# Patient Record
Sex: Female | Born: 1965
Health system: Southern US, Community
[De-identification: ages and names within clinical notes are randomized; demographics above are authoritative.]

## PROBLEM LIST (undated history)

## (undated) DIAGNOSIS — K909 Intestinal malabsorption, unspecified: Secondary | ICD-10-CM

## (undated) DIAGNOSIS — R569 Unspecified convulsions: Secondary | ICD-10-CM

## (undated) DIAGNOSIS — R112 Nausea with vomiting, unspecified: Secondary | ICD-10-CM

## (undated) DIAGNOSIS — L570 Actinic keratosis: Secondary | ICD-10-CM

## (undated) DIAGNOSIS — E8940 Asymptomatic postprocedural ovarian failure: Secondary | ICD-10-CM

## (undated) DIAGNOSIS — T7840XA Allergy, unspecified, initial encounter: Secondary | ICD-10-CM

## (undated) DIAGNOSIS — D509 Iron deficiency anemia, unspecified: Secondary | ICD-10-CM

## (undated) DIAGNOSIS — Z9889 Other specified postprocedural states: Secondary | ICD-10-CM

## (undated) DIAGNOSIS — Z9884 Bariatric surgery status: Secondary | ICD-10-CM

## (undated) DIAGNOSIS — M797 Fibromyalgia: Secondary | ICD-10-CM

## (undated) DIAGNOSIS — I10 Essential (primary) hypertension: Secondary | ICD-10-CM

## (undated) DIAGNOSIS — G4700 Insomnia, unspecified: Secondary | ICD-10-CM

## (undated) DIAGNOSIS — M1990 Unspecified osteoarthritis, unspecified site: Secondary | ICD-10-CM

## (undated) DIAGNOSIS — D649 Anemia, unspecified: Secondary | ICD-10-CM

## (undated) HISTORY — DX: Bariatric surgery status: Z98.84

## (undated) HISTORY — DX: Actinic keratosis: L57.0

## (undated) HISTORY — DX: Allergy, unspecified, initial encounter: T78.40XA

## (undated) HISTORY — DX: Iron deficiency anemia, unspecified: D50.9

## (undated) HISTORY — PX: CHOLECYSTECTOMY: SHX55

## (undated) HISTORY — PX: APPENDECTOMY: SHX54

## (undated) HISTORY — DX: Asymptomatic postprocedural ovarian failure: E89.40

## (undated) HISTORY — DX: Unspecified osteoarthritis, unspecified site: M19.90

## (undated) HISTORY — DX: Intestinal malabsorption, unspecified: K90.9

## (undated) HISTORY — DX: Insomnia, unspecified: G47.00

---
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
## 1995-06-10 HISTORY — PX: ABDOMINAL HYSTERECTOMY: SHX81

## 2000-06-09 HISTORY — PX: KNEE ARTHROSCOPY: SUR90

## 2009-06-09 HISTORY — PX: GASTRIC BYPASS: SHX52

## 2011-08-16 DIAGNOSIS — E782 Mixed hyperlipidemia: Secondary | ICD-10-CM | POA: Insufficient documentation

## 2011-08-16 DIAGNOSIS — IMO0001 Reserved for inherently not codable concepts without codable children: Secondary | ICD-10-CM | POA: Insufficient documentation

## 2011-08-16 DIAGNOSIS — G4700 Insomnia, unspecified: Secondary | ICD-10-CM | POA: Insufficient documentation

## 2011-08-16 DIAGNOSIS — Z9079 Acquired absence of other genital organ(s): Secondary | ICD-10-CM | POA: Insufficient documentation

## 2011-08-16 DIAGNOSIS — L219 Seborrheic dermatitis, unspecified: Secondary | ICD-10-CM | POA: Insufficient documentation

## 2011-08-16 DIAGNOSIS — IMO0002 Reserved for concepts with insufficient information to code with codable children: Secondary | ICD-10-CM | POA: Insufficient documentation

## 2011-08-16 DIAGNOSIS — K589 Irritable bowel syndrome without diarrhea: Secondary | ICD-10-CM | POA: Insufficient documentation

## 2011-09-03 DIAGNOSIS — Z9884 Bariatric surgery status: Secondary | ICD-10-CM | POA: Insufficient documentation

## 2011-09-04 DIAGNOSIS — D509 Iron deficiency anemia, unspecified: Secondary | ICD-10-CM | POA: Insufficient documentation

## 2011-09-04 DIAGNOSIS — R002 Palpitations: Secondary | ICD-10-CM | POA: Insufficient documentation

## 2011-09-04 DIAGNOSIS — R5383 Other fatigue: Secondary | ICD-10-CM | POA: Insufficient documentation

## 2012-08-15 DIAGNOSIS — L0390 Cellulitis, unspecified: Secondary | ICD-10-CM | POA: Insufficient documentation

## 2012-11-09 DIAGNOSIS — Z0000 Encounter for general adult medical examination without abnormal findings: Secondary | ICD-10-CM | POA: Insufficient documentation

## 2013-03-07 ENCOUNTER — Emergency Department
Admission: EM | Admit: 2013-03-07 | Discharge: 2013-03-07 | Disposition: A | Discharge status: Home / Self Care | Payer: 59 | Source: Home / Self Care | Attending: Emergency Medicine | Admitting: Emergency Medicine

## 2013-03-07 ENCOUNTER — Encounter: Payer: Self-pay | Admitting: *Deleted

## 2013-03-07 DIAGNOSIS — M545 Low back pain, unspecified: Secondary | ICD-10-CM

## 2013-03-07 DIAGNOSIS — R509 Fever, unspecified: Secondary | ICD-10-CM

## 2013-03-07 DIAGNOSIS — R52 Pain, unspecified: Secondary | ICD-10-CM

## 2013-03-07 HISTORY — DX: Essential (primary) hypertension: I10

## 2013-03-07 HISTORY — DX: Fibromyalgia: M79.7

## 2013-03-07 LAB — POCT CBC W AUTO DIFF (K'VILLE URGENT CARE)

## 2013-03-07 LAB — POCT INFLUENZA A/B
Influenza A, POC: NEGATIVE
Influenza B, POC: NEGATIVE

## 2013-03-07 LAB — POCT URINALYSIS DIP (MANUAL ENTRY)
Glucose, UA: NEGATIVE
Ketones, POC UA: NEGATIVE
Nitrite, UA: NEGATIVE
Spec Grav, UA: 1.015 (ref 1.005–1.03)
Urobilinogen, UA: 0.2 (ref 0–1)
pH, UA: 6 (ref 5–8)

## 2013-03-07 MED ORDER — LEVOFLOXACIN 500 MG PO TABS: 500.0000 mg | ORAL_TABLET | Freq: Every day | ORAL | Status: DC

## 2013-03-07 NOTE — ED Notes (Signed)
Pt c/o fever, HA, chills, body aches x 4 days. She reports having lower abd pain and back pain last wk, she went to an urgent care and was told UA was negative, and xray's showed constipation.

## 2013-03-07 NOTE — ED Provider Notes (Signed)
CSN: [id number]     Arrival date & time 03/07/13  1222 History   None    Chief Complaint  Patient presents with  . Fever  . Chills  . Generalized Body Aches  . Headache   (Consider location/radiation/quality/duration/timing/severity/associated sxs/prior Treatment) HPI This is a previously healthy patient who comes in today complaining of about 4 days of fever, headache, chills, body aches.  She also had some lower abdominal pain and lower back pain for the last week and went to an urgent care in [facility] and was told that her urinalysis was negative and an x-ray showed some constipation so they gave her some medicine for that.  Her abdominal pain feels a little better.  She does have a history of kidney stones but reports no dysuria, hematuria, frequency.  Has had some minimal upper respiratory symptoms especially sinus pressure but nothing significant.  No shortness of breath or chest pain.  A temperature last night was 103.  She was recently on a honeymoon including riding motorcycle around [place] and [place].  Past Medical History  Diagnosis Date  . Hypertension   . Fibromyalgia    Past Surgical History  Procedure Laterality Date  . Tonsillectomy    . Appendectomy    . Cholecystectomy    . Abdominal hysterectomy    . Cesarean section    . Knee arthroscopy    . Gastric bypass     Family History  Problem Relation Age of Onset  . Diabetes Father   . Hypertension Father   . Stroke Father   . Heart failure Father    History  Substance Use Topics  . Smoking status: Never Smoker   . Smokeless tobacco: Not on file  . Alcohol Use: No   OB History   Grav Para Term Preterm Abortions TAB SAB Ect Mult Living                 Review of Systems All other systems are reviewed and are negative  Allergies  Mobic and Shellfish allergy  Home Medications  No current outpatient prescriptions on file. BP 122/84  Pulse 105  Temp(Src) 102.3 F (39.1 C) (Oral)   Resp 18  Wt 174 lb (78.926 kg)  SpO2 100% Physical Exam In general the no acute distress and alert and oriented x3. HEENT exam shows no erythema of the throat but she does have some mild anterior cervical lymphadenopathy.  Ear exam is normal. Lungs are clear bilaterally Heart regular rate and rhythm Back exam shows lower right paralumbar tenderness but no actual CVA tenderness full range of motion  neuro distal neurovascular status intact Abdominal exam is benign nontender nondistended no rebound McBurney's point and Murphy sign negative   ED Course  Procedures (including critical care time) Labs Review Labs Reviewed - No data to display Imaging Review No results found. Results for orders placed during the hospital encounter of 03/07/13  POCT URINALYSIS DIP (MANUAL ENTRY)      Result Value Range   Color, UA yellow     Clarity, UA cloudy     Glucose, UA neg     Bilirubin, UA small     Bilirubin, UA negative     Spec Grav, UA 1.015  1.005 - 1.03   Blood, UA trace-lysed     pH, UA 6.0  5 - 8   Protein Ur, POC =30     Urobilinogen, UA 0.2  0 - 1   Nitrite, UA Negative  Leukocytes, UA small (1+)    POCT INFLUENZA A/B      Result Value Range   Influenza A, POC Negative     Influenza B, POC Negative    POCT CBC W AUTO DIFF ([facility])      Result Value Range   WBC    4.5 - 10.5 K/uL   Lymphocytes relative %    15 - 45 %   Monocytes relative %    2 - 10 %   Neutrophils relative % (GR)    44 - 76 %   Lymphocytes absolute    0.1 - 1.8 K/uL   Monocyes absolute    0.1 - 1 K/uL   Neutrophils absolute (GR#)    1.7 - 7.8 K/uL   RBC    3.8 - 5.1 MIL/uL   Hemoglobin    11.8 - 15.5 g/dL   Hematocrit    16.1 - 46 %   MCV    78 - 100 fL   MCH    26 - 32 pg   MCHC    32 - 36.5 g/dL   RDW    09.6 - 14 %   Platelet count    140 - 400 K/uL   MPV    7.8 - 11 fL     MDM  No diagnosis found.  Due to the vague symptoms, yet obvious fever and illness, we're going to do  some testing in-house including CBC w/ diff, UA & culture, and flu test.   CBC is essentially normal with no left shift. Urinalysis shows some trace blood and mild leukocytes Flu test is negative I suspect that her symptoms as well as lab results, she may have the beginning of an urinary tract infection versus possible sinusitis or upper respiratory viral syndrome.  However likely is a kidney stone causing her symptoms including back pain and fever.   I gave a prescription for Levaquin to cover any kind of bacterial cause and educated her on if her symptoms continue or get worse, she will either need to see a urologist more have a CT abdomen pelvis.  I would also like her to use a strainer to see if she can catch any sediment in her urine.  Marlaine Hind, MD 03/07/13 [phone]

## 2013-03-08 LAB — URINE CULTURE
Colony Count: NO GROWTH
Organism ID, Bacteria: NO GROWTH

## 2013-03-09 ENCOUNTER — Telehealth: Payer: Self-pay | Admitting: *Deleted

## 2013-03-14 ENCOUNTER — Encounter: Payer: Self-pay | Admitting: Family Medicine

## 2013-03-14 DIAGNOSIS — N12 Tubulo-interstitial nephritis, not specified as acute or chronic: Secondary | ICD-10-CM | POA: Insufficient documentation

## 2013-04-11 ENCOUNTER — Encounter: Payer: Self-pay | Admitting: Family Medicine

## 2013-04-11 ENCOUNTER — Ambulatory Visit (INDEPENDENT_AMBULATORY_CARE_PROVIDER_SITE_OTHER): Payer: 59 | Admitting: Family Medicine

## 2013-04-11 VITALS — BP 128/87 | HR 71 | Ht 64.0 in | Wt 175.0 lb

## 2013-04-11 DIAGNOSIS — F411 Generalized anxiety disorder: Secondary | ICD-10-CM | POA: Insufficient documentation

## 2013-04-11 DIAGNOSIS — M797 Fibromyalgia: Secondary | ICD-10-CM | POA: Insufficient documentation

## 2013-04-11 DIAGNOSIS — I10 Essential (primary) hypertension: Secondary | ICD-10-CM | POA: Insufficient documentation

## 2013-04-11 DIAGNOSIS — L723 Sebaceous cyst: Secondary | ICD-10-CM

## 2013-04-11 DIAGNOSIS — IMO0001 Reserved for inherently not codable concepts without codable children: Secondary | ICD-10-CM

## 2013-04-11 DIAGNOSIS — Z131 Encounter for screening for diabetes mellitus: Secondary | ICD-10-CM

## 2013-04-11 DIAGNOSIS — Z13220 Encounter for screening for lipoid disorders: Secondary | ICD-10-CM

## 2013-04-11 MED ORDER — CLONAZEPAM 0.5 MG PO TABS: 0.5000 mg | ORAL_TABLET | Freq: Every evening | ORAL | Status: DC | PRN

## 2013-04-11 NOTE — Progress Notes (Signed)
CC: Stacy Woods is a 47 y.o. female is here for Establish Care   Subjective: HPI:  Pleasant 47 year old here to establish care  Patient reports decades of fibromyalgia which has been worsening over the past 2 months. Symptoms are described as a burning and tingling "ripping" sensation that involves the trapezius muscles, quadriceps, shins is worse first thing in the morning it is worse to touch doesn't particularly helps it other than waiting for it to subside by the end of the day. Character of symptoms have not changed since diagnosis years ago. 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 It is been interfering with her sleep on a nightly basis trouble getting asleep and staying asleep. She said the sleeping issue in the past and responded well to nighttime Xanax. Denies a history of alcohol abuse, recreational drug use recently or remotely. She has used lyrica, Cymbalta, another medication she can't remember that have been ineffective for fibromyalgia  Reports a history of essential hypertension: She has been on lisinopril for over a year but she did take a one-month holiday the summer and realized that she still has uncontrolled hypertension outside blood pressures have been in the normotensive state since restarting lisinopril  Patient presents of lumps on her scalp that have been present for one year they are growing on a weekly basis they are tender to touch no be painful for the rest of the day after repeated coming of the hair. She's had a sebaceous cyst removed over a year ago. She has 4 lumps that are nondraining they're associated with pain or described as pain  Review of Systems - General ROS: negative for - chills, fever, night sweats, weight gain or weight loss Ophthalmic ROS: negative for - decreased vision Psychological  ROS: negative for - depression ENT ROS: negative for - hearing change, nasal congestion, tinnitus or allergies Hematological and Lymphatic ROS: negative for - bleeding problems, bruising or swollen lymph nodes Breast ROS: negative Respiratory ROS: no cough, shortness of breath, or wheezing Cardiovascular ROS: no chest pain or dyspnea on exertion Gastrointestinal ROS: no abdominal pain, change in bowel habits, or black or bloody stools Genito-Urinary ROS: negative for - genital discharge, genital ulcers, incontinence or abnormal bleeding from genitals Musculoskeletal ROS: negative for - joint pain or muscle pain other than that described above Neurological ROS: negative for - headaches or memory loss Dermatological ROS: negative for lumps, mole changes, rash and skin lesion changes other than that described above  Past Medical History  Diagnosis Date  . Hypertension   . Fibromyalgia      Family History  Problem Relation Age of Onset  . Diabetes Father   . Hypertension Father   . Stroke Father   . Heart failure Father      History  Substance Use Topics  . Smoking status: Never Smoker   . Smokeless tobacco: Not on file  . Alcohol Use: No     Objective: Filed Vitals:   04/11/13 1552  BP: 128/87  Pulse: 71    General: Alert and Oriented, No Acute Distress HEENT: Pupils equal, round, reactive to light. Conjunctivae clear.  External ears unremarkable, canals clear with intact TMs with appropriate landmarks.  Middle ear appears open without effusion. Pink inferior turbinates.  Moist mucous membranes, pharynx without inflammation nor lesions.  Neck supple without palpable lymphadenopathy nor  abnormal masses. Scalp: She has 4  pea-sized nodules on the crown was unremarkable overlying skin.  Lungs: Clear to auscultation bilaterally, no wheezing/ronchi/rales.  Comfortable work of breathing. Good air movement. Cardiac: Regular rate and rhythm. Normal S1/S2.  No murmurs, rubs, nor  gallops.   Extremities: No peripheral edema.  Strong peripheral pulses.  Mental Status: No depression, anxiety, nor agitation. Skin: Warm and dry.  Assessment & Plan: Stacy Woods was seen today for establish care.  Diagnoses and associated orders for this visit:  Fibromyalgia  Essential hypertension, benign  Anxiety state, unspecified - clonazePAM (KLONOPIN) 0.5 MG tablet; Take 1 tablet (0.5 mg total) by mouth at bedtime as needed for anxiety.  Screening, lipid - Lipid panel  Diabetes mellitus screening - BASIC METABOLIC PANEL WITH GFR  Sebaceous cyst    Fibromyalgia: Uncontrolled, she believes her quality of life would be worse taking a medication on a daily basis rather than putting up a symptoms. Discussed exercise on a daily basis to help prevent symptoms. Anxiety: Likely situational due to recent temporarly life stressors, avoid xanax but can consider clonazepam PRN if needed. Essential hypertension: Controlled  continue lisinopril  Is been over year since she had dyslipidemia screening and diabetic screening Sebaceous cyst: Return for excision with Dr. Karie Schwalbe, I'll be happy to help with any sites not involving the scalp  Return in about 3 months (around 07/12/2013).

## 2013-04-22 ENCOUNTER — Ambulatory Visit (INDEPENDENT_AMBULATORY_CARE_PROVIDER_SITE_OTHER): Payer: 59 | Admitting: Sports Medicine

## 2013-04-22 DIAGNOSIS — L723 Sebaceous cyst: Secondary | ICD-10-CM

## 2013-04-22 MED ORDER — HYDROCODONE-ACETAMINOPHEN 5-325 MG PO TABS: 1.0000 | ORAL_TABLET | Freq: Three times a day (TID) | ORAL | Status: DC | PRN

## 2013-04-22 NOTE — Assessment & Plan Note (Signed)
Sebaceous cyst removal x4 on a scalp. A single horizontal mattress suture placed with 4-0 Prolene in each incision. I will see her back in one week for suture removal and wound check.

## 2013-04-22 NOTE — Progress Notes (Signed)
   Subjective:    I'm seeing this patient as a consultation for:  Dr. Ivan Anchors.  CC: Scalp masses.  HPI: This is a very pleasant female with a long history of multiple scalp masses.  The masses are sometimes mildly tender, growing only slowly.  Symptoms are persistent.  She has had scalp cyst excisions in the past.  She has 4.  Past medical history, Surgical history, Family history not pertinant except as noted below, Social history, Allergies, and medications have been entered into the medical record, reviewed, and no changes needed.   Review of Systems: No headache, visual changes, nausea, vomiting, diarrhea, constipation, dizziness, abdominal pain, skin rash, fevers, chills, night sweats, weight loss, swollen lymph nodes, body aches, joint swelling, muscle aches, chest pain, shortness of breath, mood changes, visual or auditory hallucinations.   Objective:   General: Well Developed, well nourished, and in no acute distress.  Neuro/Psych: Alert and oriented x3, extra-ocular muscles intact, able to move all 4 extremities, sensation grossly intact. Skin: Warm and dry, no rashes noted. There are four palpable subcutaneous masses on the scalp, movable, well defined. Respiratory: Not using accessory muscles, speaking in full sentences, trachea midline.  Cardiovascular: Pulses palpable, no extremity edema. Abdomen: Does not appear distended.  Procedure:  Excision of four 1 cm scalp sebaceous cysts.   Risks, benefits, and alternatives explained and consent obtained. Time out conducted. Surface prepped with alcohol. 5cc lidocaine with epinephine infiltrated in a field block. Adequate anesthesia ensured. Area prepped and draped in a sterile fashion. A linear incision was made over the cyst, using blunt dissection, the cyst and its capsule were removed, and a single horizontal mattress suture was placed with 3-0 Prolene Hemostasis achieved. Pt stable. This procedure was repeated on all 4  cysts.  Impression and Recommendations:   This case required medical decision making of moderate complexity.

## 2013-04-29 ENCOUNTER — Ambulatory Visit (INDEPENDENT_AMBULATORY_CARE_PROVIDER_SITE_OTHER): Payer: 59 | Admitting: Sports Medicine

## 2013-04-29 ENCOUNTER — Encounter: Payer: Self-pay | Admitting: Sports Medicine

## 2013-04-29 VITALS — BP 133/91 | HR 78 | Wt 178.0 lb

## 2013-04-29 DIAGNOSIS — L723 Sebaceous cyst: Secondary | ICD-10-CM

## 2013-04-29 NOTE — Progress Notes (Signed)
  Subjective: 1 week status post excision 4 scalp sebaceous cysts, she's doing extremely well and here for suture removal   Objective: General: Well-developed, well-nourished, and in no acute distress. Cyst were completely removed they're capsules, there is no sign of infection or bleeding, all 4 horizontal mattress sutures were removed easily.  Assessment/plan:

## 2013-04-29 NOTE — Assessment & Plan Note (Signed)
Cured! Return as needed.

## 2013-05-16 ENCOUNTER — Other Ambulatory Visit: Payer: Self-pay | Admitting: Family Medicine

## 2013-05-17 ENCOUNTER — Other Ambulatory Visit: Payer: Self-pay | Admitting: Family Medicine

## 2013-05-24 ENCOUNTER — Other Ambulatory Visit: Payer: Self-pay | Admitting: Family Medicine

## 2013-05-31 ENCOUNTER — Telehealth: Payer: Self-pay | Admitting: *Deleted

## 2013-05-31 MED ORDER — CLONAZEPAM 0.5 MG PO TABS: ORAL_TABLET | ORAL | Status: DC

## 2013-05-31 NOTE — Telephone Encounter (Signed)
Pt called and states the pharm has been trying to contact us about a refill on clonazepam and they havent received a response. I told the pt that it was too soon to fill this because per our records it was just filled on 12/8. Pt states they never received a rx on 12/8 and the last time she had the rx filled was in Nov. I called the pharm and pharmacist confirmed that last rx filled for that medication was 11/3. He also checked the other [facility] pharm that pt has rx's filled at and there wasn't another rx on file there either. I did give the pharmacist a verbal ok on the clonzepam to be filled today.

## 2013-08-31 ENCOUNTER — Encounter: Payer: Self-pay | Admitting: Family Medicine

## 2013-08-31 ENCOUNTER — Ambulatory Visit (INDEPENDENT_AMBULATORY_CARE_PROVIDER_SITE_OTHER): Payer: 59 | Admitting: Family Medicine

## 2013-08-31 VITALS — BP 173/111 | HR 64 | Temp 98.3°F | Wt 186.0 lb

## 2013-08-31 DIAGNOSIS — F411 Generalized anxiety disorder: Secondary | ICD-10-CM

## 2013-08-31 DIAGNOSIS — I10 Essential (primary) hypertension: Secondary | ICD-10-CM

## 2013-08-31 DIAGNOSIS — E162 Hypoglycemia, unspecified: Secondary | ICD-10-CM

## 2013-08-31 LAB — HEMOGLOBIN A1C
Hgb A1c MFr Bld: 6.1 % — ABNORMAL HIGH (ref <5.7)
Mean Plasma Glucose: 128 mg/dL — ABNORMAL HIGH (ref <117)

## 2013-08-31 MED ORDER — LISINOPRIL 20 MG PO TABS: 20.0000 mg | ORAL_TABLET | Freq: Every day | ORAL | Status: DC

## 2013-08-31 MED ORDER — CLONAZEPAM 0.5 MG PO TABS: ORAL_TABLET | ORAL | Status: DC

## 2013-08-31 NOTE — Progress Notes (Signed)
CC: Stacy Woods is a 48 y.o. female is here for not feeling well   Subjective: HPI:  Complains of a vague headache that is hard to describe other than pressure that comes and goes on a daily basis slightly improved with Excedrin no other interventions. Has been present for the last week and a half. Moderate in severity at maximum particularly makes it better or worse other than above. Today describes mild numbness in the lips denies any change her diet or in personal care products.  Medication changes since I saw her last include her discontinuing lisinopril inJanuary due to running out of refills.  Other than above denies motor or sensory disturbances, chest pain, shortness of breath, irregular heartbeat, confusion or dizziness.  Complains of worsening anxiety due to slow recovery from husband back surgery. Symptoms are present on a daily basis mild to moderate in severity. Worsened with stress at work. Improves with taking the Klonopin at bedtime most nights of the week. Denies any mental disturbance other than above.    Reports that she has had 2 episodes since I saw her last her she began to have a tremor with mild clammy feeling which take her blood sugar and it would be in the 40s. She denies taking any anti-hyperglycemics nor diet restriction. She has a family history of diabetes and is concerned this could be the early stages. Denies polyuria polyphasia or polydipsia   Review Of Systems Outlined In HPI  Past Medical History  Diagnosis Date  . Hypertension   . Fibromyalgia     Past Surgical History  Procedure Laterality Date  . Tonsillectomy  1977  . Appendectomy    . Cholecystectomy    . Abdominal hysterectomy  1997  . Cesarean section  [id number]  . Knee arthroscopy  2002  . Gastric bypass  2011   Family History  Problem Relation Age of Onset  . Diabetes Father   . Hypertension Father   . Stroke Father   . Heart failure Father     History   Social History  . Marital  Status: Married    Spouse Name: N/A    Number of Children: N/A  . Years of Education: N/A   Occupational History  . Not on file.   Social History Main Topics  . Smoking status: Never Smoker   . Smokeless tobacco: Not on file  . Alcohol Use: No  . Drug Use: No  . Sexual Activity: Not on file   Other Topics Concern  . Not on file   Social History Narrative  . No narrative on file     Objective: BP 173/111  Pulse 64  Temp(Src) 98.3 F (36.8 C) (Oral)  Wt 186 lb (84.369 kg)  General: Alert and Oriented, No Acute Distress HEENT: Pupils equal, round, reactive to light. Conjunctivae clear.  External ears unremarkable, canals clear with intact TMs with appropriate landmarks.  Middle ear appears open without effusion. Pink inferior turbinates.  Moist mucous membranes, pharynx without inflammation nor lesions.  Neck supple without palpable lymphadenopathy nor abnormal masses. Lungs: Clear to auscultation bilaterally, no wheezing/ronchi/rales.  Comfortable work of breathing. Good air movement. Cardiac: Regular rate and rhythm. Normal S1/S2.  No murmurs, rubs, nor gallops. No carotid bruits  Neuro: CN II-XII grossly intact, full strength/rom of all four extremities, C5/L4/S1 DTRs 2/4 bilaterally, gait normal, rapid alternating movements normal, Mental Status: No depression, anxiety, nor agitation. Skin: Warm and dry.  Assessment & Plan: Stacy Woods was seen today for not feeling  well.  Diagnoses and associated orders for this visit:  Essential hypertension, benign - lisinopril (PRINIVIL,ZESTRIL) 20 MG tablet; Take 1 tablet (20 mg total) by mouth daily.  Hypoglycemia - HgB A1c  Generalized anxiety disorder - clonazePAM (KLONOPIN) 0.5 MG tablet; TAKE 1 TABLET BY MOUTH AT BEDTIME AS NEEDED FOR ANXIETY  Other Orders - Cancel: lisinopril (PRINIVIL,ZESTRIL) 20 MG tablet; Take 1 tablet (20 mg total) by mouth daily.    Essential hypertension: Controlled chronic condition restart  lisinopril however start at 10 mg daily for 4 days then upgrade to 20 mg daily, return in 2-4 weeks if blood,  pressure is not below 140/90. Return 1 week if headache is not improving. Hypoglycemia: Check A1c Generalized anxiety disorder: Continue as needed clonazepam  Return in about 3 months (around 12/01/2013), or if symptoms worsen or fail to improve.

## 2013-09-01 ENCOUNTER — Encounter: Payer: Self-pay | Admitting: Family Medicine

## 2013-09-01 DIAGNOSIS — R7303 Prediabetes: Secondary | ICD-10-CM

## 2013-09-01 HISTORY — DX: Prediabetes: R73.03

## 2013-09-07 ENCOUNTER — Encounter: Payer: Self-pay | Admitting: Family Medicine

## 2013-09-08 MED ORDER — LISINOPRIL-HYDROCHLOROTHIAZIDE 20-12.5 MG PO TABS: 1.0000 | ORAL_TABLET | Freq: Every day | ORAL | Status: DC

## 2013-11-14 ENCOUNTER — Encounter: Payer: Self-pay | Admitting: Family Medicine

## 2013-11-14 ENCOUNTER — Ambulatory Visit (INDEPENDENT_AMBULATORY_CARE_PROVIDER_SITE_OTHER): Payer: 59 | Admitting: Family Medicine

## 2013-11-14 VITALS — BP 111/70 | HR 71 | Wt 182.0 lb

## 2013-11-14 DIAGNOSIS — M1710 Unilateral primary osteoarthritis, unspecified knee: Secondary | ICD-10-CM

## 2013-11-14 DIAGNOSIS — IMO0002 Reserved for concepts with insufficient information to code with codable children: Secondary | ICD-10-CM

## 2013-11-14 DIAGNOSIS — M1712 Unilateral primary osteoarthritis, left knee: Secondary | ICD-10-CM

## 2013-11-14 MED ORDER — DICLOFENAC SODIUM 50 MG PO TBEC: DELAYED_RELEASE_TABLET | ORAL | Status: DC

## 2013-11-14 NOTE — Progress Notes (Signed)
CC: Stacy Woods is a 48 y.o. female is here for left knee pain   Subjective: HPI:  Complains of left knee pain that has been present on a daily basis for the past month localized to the anterior aspect of the knee, just above the kneecap radiates slightly up into the anterior thigh. Worse going upstairs. Overall appears to be moderate in severity slight improvement with ibuprofen. Is accompanied by mild swelling at the end of the day but no redness, bruising, nor mechanical symptoms. She has a history of meniscal repair in 2012.  She denies any recent trauma or overexertion preceding the onset of this pain.  Denies motor or sensory disturbances in the left lower extremity, fevers, chills, nor joint pain elsewhere.   Review Of Systems Outlined In HPI  Past Medical History  Diagnosis Date  . Hypertension   . Fibromyalgia     Past Surgical History  Procedure Laterality Date  . Tonsillectomy  1977  . Appendectomy    . Cholecystectomy    . Abdominal hysterectomy  1997  . Cesarean section  [id number]  . Knee arthroscopy  2002  . Gastric bypass  2011   Family History  Problem Relation Age of Onset  . Diabetes Father   . Hypertension Father   . Stroke Father   . Heart failure Father     History   Social History  . Marital Status: Married    Spouse Name: N/A    Number of Children: N/A  . Years of Education: N/A   Occupational History  . Not on file.   Social History Main Topics  . Smoking status: Never Smoker   . Smokeless tobacco: Not on file  . Alcohol Use: No  . Drug Use: No  . Sexual Activity: Not on file   Other Topics Concern  . Not on file   Social History Narrative  . No narrative on file     Objective: BP 111/70  Pulse 71  Wt 182 lb (82.555 kg)  General: Alert and Oriented, No Acute Distress HEENT: Pupils equal, round, reactive to light. Conjunctivae clear.  Moist mucous membranes pharynx unremarkable Lungs: Clear comfortable work of breathing Cardiac:  Regular rate and rhythm.  Left knee exam shows full-strength and range of motion. There is no swelling, redness, nor warmth overlying the knee.  Positive patellar crepitus. Positive patellar apprehension. No pain with palpation of the inferior patellar pole.  No pain or laxity with valgus nor varus stress. Anterior drawer is negative. McMurray's negative. No popliteal space tenderness or palpable mass. No medial or lateral joint line tenderness to palpation. Extremities: No peripheral edema.  Strong peripheral pulses.  Mental Status: No depression, anxiety, nor agitation. Skin: Warm and dry.  Assessment & Plan: Stacy Woods was seen today for left knee pain.  Diagnoses and associated orders for this visit:  Patellofemoral arthritis of left knee - diclofenac (VOLTAREN) 50 MG EC tablet; Take one tablet every 8 hours only as needed for pain, take with small snack.    Start home exercises/rehab for the next three weeks, as needed diclofenac, and return if not improving to see Dr. Darene Lamer in sports medicine.   Return in about 4 weeks (around 12/12/2013) for left knee pain.

## 2013-11-16 ENCOUNTER — Other Ambulatory Visit: Payer: Self-pay | Admitting: Family Medicine

## 2013-11-16 DIAGNOSIS — Z1231 Encounter for screening mammogram for malignant neoplasm of breast: Secondary | ICD-10-CM

## 2013-11-29 ENCOUNTER — Ambulatory Visit (HOSPITAL_COMMUNITY): Payer: 59

## 2013-11-29 ENCOUNTER — Ambulatory Visit (HOSPITAL_COMMUNITY)
Admission: RE | Admit: 2013-11-29 | Discharge: 2013-11-29 | Disposition: A | Discharge status: Home / Self Care | Payer: 59 | Source: Ambulatory Visit | Attending: Family Medicine | Admitting: Family Medicine

## 2013-11-29 DIAGNOSIS — Z1231 Encounter for screening mammogram for malignant neoplasm of breast: Secondary | ICD-10-CM | POA: Insufficient documentation

## 2014-01-16 ENCOUNTER — Encounter: Payer: Self-pay | Admitting: Family Medicine

## 2014-01-16 ENCOUNTER — Ambulatory Visit (INDEPENDENT_AMBULATORY_CARE_PROVIDER_SITE_OTHER): Payer: 59 | Admitting: Family Medicine

## 2014-01-16 VITALS — BP 109/78 | HR 68 | Ht 64.0 in | Wt 188.0 lb

## 2014-01-16 DIAGNOSIS — F411 Generalized anxiety disorder: Secondary | ICD-10-CM

## 2014-01-16 DIAGNOSIS — I10 Essential (primary) hypertension: Secondary | ICD-10-CM

## 2014-01-16 MED ORDER — ESCITALOPRAM OXALATE 10 MG PO TABS: 10.0000 mg | ORAL_TABLET | Freq: Every day | ORAL | Status: DC

## 2014-01-16 NOTE — Progress Notes (Signed)
CC: Stacy Woods is a 48 y.o. female is here for Anxiety   Subjective: HPI:  Followup anxiety: She stopped taking clonazepam at night because she felt that it was becoming ineffective. She now states that she has overwhelming sense of anxiety and stress moderate to severe in severity presently basis all hours during the day. Seems to be worse when confronted with job responsibilities, declining health of her husband, demands of her children. She states that she's always expecting the worst cannot seem to afford anything right now. Symptoms have been worsening over the past 3 months. Denies thoughts or harm herself or others.   Followup essential hypertension: Continues on lisinopril-hydrochlorothiazide on a daily basis no outside blood pressures to report. There's been no chest pain shortness of breath orthopnea nor motor or sensory disturbances  Review Of Systems Outlined In HPI  Past Medical History  Diagnosis Date  . Hypertension   . Fibromyalgia     Past Surgical History  Procedure Laterality Date  . Tonsillectomy  1977  . Appendectomy    . Cholecystectomy    . Abdominal hysterectomy  1997  . Cesarean section  [id number]  . Knee arthroscopy  2002  . Gastric bypass  2011   Family History  Problem Relation Age of Onset  . Diabetes Father   . Hypertension Father   . Stroke Father   . Heart failure Father     History   Social History  . Marital Status: Married    Spouse Name: N/A    Number of Children: N/A  . Years of Education: N/A   Occupational History  . Not on file.   Social History Main Topics  . Smoking status: Never Smoker   . Smokeless tobacco: Not on file  . Alcohol Use: No  . Drug Use: No  . Sexual Activity: Not on file   Other Topics Concern  . Not on file   Social History Narrative  . No narrative on file     Objective: BP 109/78  Pulse 68  Ht 5\' 4"  (1.626 m)  Wt 188 lb (85.276 kg)  BMI 32.25 kg/m2  Vital signs reviewed. General: Alert and  Oriented, No Acute Distress HEENT: Pupils equal, round, reactive to light. Conjunctivae clear.  External ears unremarkable.  Moist mucous membranes. Lungs: Clear and comfortable work of breathing, speaking in full sentences without accessory muscle use. Cardiac: Regular rate and rhythm.  Neuro: CN II-XII grossly intact, gait normal. Extremities: No peripheral edema.  Strong peripheral pulses.  Mental Status: Mild anxiety and depression, no agitation. Logical thought process Skin: Warm and dry. Assessment & Plan: Stacy Woods was seen today for anxiety.  Diagnoses and associated orders for this visit:  Anxiety state, unspecified - escitalopram (LEXAPRO) 10 MG tablet; Take 1 tablet (10 mg total) by mouth daily.  Essential hypertension, benign    Anxiety: Uncontrolled chronic condition start Lexapro Essential hypertension: Controlled continue hydrochlorothiazide/lisinopril  Return in about 4 weeks (around 02/13/2014) for Anxiety Follow Up.

## 2014-03-30 ENCOUNTER — Ambulatory Visit (INDEPENDENT_AMBULATORY_CARE_PROVIDER_SITE_OTHER): Payer: 59

## 2014-03-30 ENCOUNTER — Ambulatory Visit (INDEPENDENT_AMBULATORY_CARE_PROVIDER_SITE_OTHER): Payer: 59 | Admitting: Family Medicine

## 2014-03-30 ENCOUNTER — Encounter: Payer: Self-pay | Admitting: Family Medicine

## 2014-03-30 VITALS — BP 109/83 | HR 64 | Wt 192.0 lb

## 2014-03-30 DIAGNOSIS — M79642 Pain in left hand: Secondary | ICD-10-CM

## 2014-03-30 DIAGNOSIS — M797 Fibromyalgia: Secondary | ICD-10-CM

## 2014-03-30 DIAGNOSIS — M79641 Pain in right hand: Secondary | ICD-10-CM

## 2014-03-30 LAB — RHEUMATOID FACTOR: Rhuematoid fact SerPl-aCnc: <10 IU/mL (ref <=14)

## 2014-03-30 MED ORDER — DICLOFENAC SODIUM 2 % TD SOLN: TRANSDERMAL | Status: DC

## 2014-03-30 NOTE — Progress Notes (Signed)
CC: Stacy Woods is a 48 y.o. female is here for Hand Pain   Subjective: HPI:  Bilateral hand pain that has been present for 2-3 months but worsening over the past 2-3 weeks. It is worse first thing in the morning and also with prolonged activity. Seems to be worse with writing, gripping objects, typing, and with every day to day activities. Accompanied by swelling in the knuckles and PIPs which is also where she identifies her pain. Nothing seems to make it better. She's tried topical over-the-counter therapy and a single dose of Advil without much benefit. She denies any recent or remote trauma or overexertion. Denies any other overlying skin changes nor joint pain elsewhere. Denies any recent or remote rashes. Denies fevers, chills, unintentional weight loss, nor pain elsewhere.  Review Of Systems Outlined In HPI  Past Medical History  Diagnosis Date  . Hypertension   . Fibromyalgia     Past Surgical History  Procedure Laterality Date  . Tonsillectomy  1977  . Appendectomy    . Cholecystectomy    . Abdominal hysterectomy  1997  . Cesarean section  [id number]  . Knee arthroscopy  2002  . Gastric bypass  2011   Family History  Problem Relation Age of Onset  . Diabetes Father   . Hypertension Father   . Stroke Father   . Heart failure Father     History   Social History  . Marital Status: Married    Spouse Name: N/A    Number of Children: N/A  . Years of Education: N/A   Occupational History  . Not on file.   Social History Main Topics  . Smoking status: Never Smoker   . Smokeless tobacco: Not on file  . Alcohol Use: No  . Drug Use: No  . Sexual Activity: Not on file   Other Topics Concern  . Not on file   Social History Narrative  . No narrative on file     Objective: BP 109/83  Pulse 64  Wt 192 lb (87.091 kg)  General: Alert and Oriented, No Acute Distress HEENT: Pupils equal, round, reactive to light. Conjunctivae clear.  Moist membranes pharynx  unremarkable Lungs: Clear comfortable work of breathing Cardiac: Regular rate and rhythm.  Extremities: No peripheral edema.  Strong peripheral pulses. Phalen's negative, no pain in anatomical snuff box bilaterally, Finkelstein's produces pain but is not technically positive. Mild bilateral MCP swelling and tenderness to palpation Mental Status: No depression, anxiety, nor agitation. Skin: Warm and dry.  Assessment & Plan: Stacy Woods was seen today for hand pain.  Diagnoses and associated orders for this visit:  Fibromyalgia  Bilateral hand pain - Rheumatoid Factor - Cyclic citrul peptide antibody, IgG - Antinuclear Antib (ANA) - DG Hand 2 View Left; Future - DG Hand 2 View Right; Future - Diclofenac Sodium (PENNSAID) 2 % SOLN; Apply to knuckles 3-4 times a day as needed for pain    Bilateral hand pain: Rule out rheumatoid and screen for connective tissue disease with the above labs, x-rays of both hands. Trial of diclofenac, samples provided.  Return if symptoms worsen or fail to improve.

## 2014-03-31 LAB — ANA: ANA: NEGATIVE

## 2014-03-31 LAB — CYCLIC CITRUL PEPTIDE ANTIBODY, IGG: Cyclic Citrullin Peptide Ab: <2 U/mL (ref 0.0–5.0)

## 2014-04-05 ENCOUNTER — Telehealth: Payer: Self-pay | Admitting: Family Medicine

## 2014-04-05 DIAGNOSIS — M79642 Pain in left hand: Principal | ICD-10-CM

## 2014-04-05 DIAGNOSIS — M79641 Pain in right hand: Secondary | ICD-10-CM

## 2014-04-05 DIAGNOSIS — M1990 Unspecified osteoarthritis, unspecified site: Secondary | ICD-10-CM

## 2014-04-05 MED ORDER — DICLOFENAC SODIUM 2 % TD SOLN: TRANSDERMAL | Status: DC

## 2014-04-05 NOTE — Telephone Encounter (Signed)
Seth Bake, Rx sent per patient request in recent result note.

## 2014-05-08 ENCOUNTER — Encounter: Payer: Self-pay | Admitting: Family Medicine

## 2014-05-08 ENCOUNTER — Telehealth: Payer: Self-pay | Admitting: Family Medicine

## 2014-05-08 MED ORDER — PREGABALIN 75 MG PO CAPS: 75.0000 mg | ORAL_CAPSULE | Freq: Two times a day (BID) | ORAL | Status: DC

## 2014-05-08 NOTE — Telephone Encounter (Signed)
Seth Bake, Will you please let patient know that my first recommendation would be to start Lyrica 75mg  twice a day (samples on your desk) and then follow up with me 1-2 weeks after starting it to see how she's responding.         ----- Message -----     From: Genia Hotter     Sent: 05/08/2014  7:51 AM      To: [facility] Clinical Pool    Subject: Non-Urgent Medical Question                 Dr. Ileene Rubens,        I am struggling greatly with my fibromylgia. It has really increased in intensity over the last three weeks to the point of being unbearable. I feel that I need to attempt a medication regiment again. Do you have a recommendation that will work well with our insurance that I can try.  If I need to schedule an appointment first, please let me know as today it a really bad day for pain for me. I have made it into work; however, the pain it about an 8 to 9 on a scale of 0-10. Just looking for some help at this point. Again, if I need an appointment first, please let me know as soon as possible.        Thanks, Stacy Woods

## 2014-05-08 NOTE — Telephone Encounter (Signed)
Called pt but she wasn't home per her husband

## 2014-05-10 ENCOUNTER — Ambulatory Visit: Payer: 59

## 2014-05-10 NOTE — Telephone Encounter (Signed)
Message left on pt's cell

## 2014-05-12 ENCOUNTER — Ambulatory Visit: Payer: 59 | Admitting: Family Medicine

## 2014-05-23 ENCOUNTER — Encounter: Payer: Self-pay | Admitting: Family Medicine

## 2014-05-23 ENCOUNTER — Ambulatory Visit (INDEPENDENT_AMBULATORY_CARE_PROVIDER_SITE_OTHER): Payer: 59 | Admitting: Family Medicine

## 2014-05-23 VITALS — BP 97/71 | HR 78 | Wt 198.0 lb

## 2014-05-23 DIAGNOSIS — M797 Fibromyalgia: Secondary | ICD-10-CM

## 2014-05-23 MED ORDER — GABAPENTIN 300 MG PO CAPS: ORAL_CAPSULE | ORAL | Status: DC

## 2014-05-23 NOTE — Progress Notes (Signed)
CC: Stacy Woods is a 48 y.o. female is here for f/u fibromyalgia   Subjective: HPI:  Has been taking Lyrica 75 mg twice a day for the past 2 weeks. She states that beginning on the first day of taking that she had immediate sensation that she was drunk. She also had a sensation that she is watching her body from above. She denies any other mental disturbance or motor or sensory disturbances.she statesshe's had no improvement with her fibromyalgia and continue to needs to have pain in the muscle bellies of the leg, hips, or and front and posterior shoulders. She denies any focal joint pain. She's tried Cymbalta in the past and had no improvement of the above symptoms. She's also tried multiple over-the-counter nonsteroidal anti-inflammatories with no benefit to her pain. Nothing particularly makes it better or worse. She is dealing with it on a daily basis. She denies any weakness or skin changes.   Review Of Systems Outlined In HPI  Past Medical History  Diagnosis Date  . Hypertension   . Fibromyalgia     Past Surgical History  Procedure Laterality Date  . Tonsillectomy  1977  . Appendectomy    . Cholecystectomy    . Abdominal hysterectomy  1997  . Cesarean section  [id number]  . Knee arthroscopy  2002  . Gastric bypass  2011   Family History  Problem Relation Age of Onset  . Diabetes Father   . Hypertension Father   . Stroke Father   . Heart failure Father     History   Social History  . Marital Status: Married    Spouse Name: N/A    Number of Children: N/A  . Years of Education: N/A   Occupational History  . Not on file.   Social History Main Topics  . Smoking status: Never Smoker   . Smokeless tobacco: Not on file  . Alcohol Use: No  . Drug Use: No  . Sexual Activity: Not on file   Other Topics Concern  . Not on file   Social History Narrative     Objective: BP 97/71 mmHg  Pulse 78  Wt 198 lb (89.812 kg)  Vital signs reviewed. General: Alert and  Oriented, No Acute Distress HEENT: Pupils equal, round, reactive to light. Conjunctivae clear.  External ears unremarkable.  Moist mucous membranes. Lungs: Clear and comfortable work of breathing, speaking in full sentences without accessory muscle use. Cardiac: Regular rate and rhythm.  Neuro: CN II-XII grossly intact, gait normal. Extremities: No peripheral edema.  Strong peripheral pulses.  Mental Status: No depression, anxiety, nor agitation. Logical though process. Skin: Warm and dry. Assessment & Plan: Stacy Woods was seen today for f/u fibromyalgia.  Diagnoses and associated orders for this visit:  Fibromyalgia - gabapentin (NEURONTIN) 300 MG capsule; One by mouth at bedtime to prevent fibromyalgia.  If needed may add a dose at breakfast and lunch.    From a myalgia uncontrolled with intolerable side effects from Lyrica. Report to stop Lyrica and begin low-dose of gabapentin and will titrate up as needed. Next intervention that I can think of would be Savella.   Return if symptoms worsen or fail to improve.

## 2014-06-14 ENCOUNTER — Encounter: Payer: Self-pay | Admitting: Family Medicine

## 2014-06-14 ENCOUNTER — Ambulatory Visit (INDEPENDENT_AMBULATORY_CARE_PROVIDER_SITE_OTHER): Payer: 59 | Admitting: Family Medicine

## 2014-06-14 VITALS — BP 117/83 | HR 72 | Temp 98.1°F | Wt 198.0 lb

## 2014-06-14 DIAGNOSIS — A499 Bacterial infection, unspecified: Secondary | ICD-10-CM

## 2014-06-14 DIAGNOSIS — J329 Chronic sinusitis, unspecified: Secondary | ICD-10-CM

## 2014-06-14 DIAGNOSIS — B9689 Other specified bacterial agents as the cause of diseases classified elsewhere: Secondary | ICD-10-CM

## 2014-06-14 MED ORDER — DOXYCYCLINE HYCLATE 100 MG PO TABS: ORAL_TABLET | ORAL | Status: DC

## 2014-06-14 MED ORDER — DOXYCYCLINE HYCLATE 100 MG PO TABS: ORAL_TABLET | ORAL | Status: AC

## 2014-06-14 NOTE — Progress Notes (Signed)
CC: Stacy Woods is a 49 y.o. female is here for cough and congestion   Subjective: HPI:   pain and pressurebetween both cheeks and beneath both cheeks that has been present for 9 days. Worsening on a daily basis seems to fluctuate throughout the day. Now moderate to severe in severity. Accompanied by postnasal drip and fatigue. Nonproductive cough with scratchy throat. Symptoms are improved with NyQuil nothing else seems to make better or worse. Denies headache other than above denies chest pain shortness of breath wheezing fevers chills nor confusion.   Review Of Systems Outlined In HPI  Past Medical History  Diagnosis Date  . Hypertension   . Fibromyalgia     Past Surgical History  Procedure Laterality Date  . Tonsillectomy  1977  . Appendectomy    . Cholecystectomy    . Abdominal hysterectomy  1997  . Cesarean section  [id number]  . Knee arthroscopy  2002  . Gastric bypass  2011   Family History  Problem Relation Age of Onset  . Diabetes Father   . Hypertension Father   . Stroke Father   . Heart failure Father     History   Social History  . Marital Status: Married    Spouse Name: N/A    Number of Children: N/A  . Years of Education: N/A   Occupational History  . Not on file.   Social History Main Topics  . Smoking status: Never Smoker   . Smokeless tobacco: Not on file  . Alcohol Use: No  . Drug Use: No  . Sexual Activity: Not on file   Other Topics Concern  . Not on file   Social History Narrative     Objective: BP 117/83 mmHg  Pulse 72  Temp(Src) 98.1 F (36.7 C) (Oral)  Wt 198 lb (89.812 kg)  General: Alert and Oriented, No Acute Distress HEENT: Pupils equal, round, reactive to light. Conjunctivae clear.  External ears unremarkable, canals clear with intact TMs with appropriate landmarks.  Middle ear appears open without effusion. Pink inferior turbinates.  Moist mucous membranes, pharynx without inflammation nor lesions however moderate  cobblestoning.  Neck supple without palpable lymphadenopathy nor abnormal masses. Lungs: Clear to auscultation bilaterally, no wheezing/ronchi/rales.  Comfortable work of breathing. Good air movement. Skin: Warm and dry.  Assessment & Plan: Stacy Woods was seen today for cough and congestion.  Diagnoses and associated orders for this visit:  Bacterial sinusitis - Discontinue: doxycycline (VIBRA-TABS) 100 MG tablet; One by mouth twice a day for ten days. - doxycycline (VIBRA-TABS) 100 MG tablet; One by mouth twice a day for ten days.    Bacterial sinusitis: Start doxycycline consider nasal saline washes and Advil Cold and Sinus. Call if no better by Friday  Return if symptoms worsen or fail to improve.

## 2014-07-18 ENCOUNTER — Encounter: Payer: Self-pay | Admitting: Family Medicine

## 2014-07-18 ENCOUNTER — Ambulatory Visit (INDEPENDENT_AMBULATORY_CARE_PROVIDER_SITE_OTHER): Payer: 59 | Admitting: Family Medicine

## 2014-07-18 VITALS — BP 118/72 | HR 69 | Wt 192.0 lb

## 2014-07-18 DIAGNOSIS — M5412 Radiculopathy, cervical region: Secondary | ICD-10-CM

## 2014-07-18 MED ORDER — PREDNISONE 20 MG PO TABS: ORAL_TABLET | ORAL | Status: AC

## 2014-07-18 NOTE — Progress Notes (Signed)
CC: Stacy Woods is a 49 y.o. female is here for neck and shoulder pain   Subjective: HPI:  Complains of posterior right neck pain that radiates down the right arm. Originally it was just above the right shoulder but over the past few days it's involving the entire right arm. The pain is described as a burning sensation and ripping sensation in the back of the neck however the rest of the pain down her arm just "doesn't feel right, kind of tingly, kind of sore ". Since have been worsening over the past 2 weeks. They came on gradually. Nothing seems to make it better or worse but awakes her from night frequently. There are no positional component to make the symptoms better or worse. She denies this ever bothering her in the past overall symptoms are moderate in severity. She denies any trauma or overexertion. Denies any other motor or sensory disturbances in the right upper extremity swelling redness or warmth of the right upper extremity or back of the neck   Review Of Systems Outlined In HPI  Past Medical History  Diagnosis Date  . Hypertension   . Fibromyalgia     Past Surgical History  Procedure Laterality Date  . Tonsillectomy  1977  . Appendectomy    . Cholecystectomy    . Abdominal hysterectomy  1997  . Cesarean section  [id number]  . Knee arthroscopy  2002  . Gastric bypass  2011   Family History  Problem Relation Age of Onset  . Diabetes Father   . Hypertension Father   . Stroke Father   . Heart failure Father     History   Social History  . Marital Status: Married    Spouse Name: N/A    Number of Children: N/A  . Years of Education: N/A   Occupational History  . Not on file.   Social History Main Topics  . Smoking status: Never Smoker   . Smokeless tobacco: Not on file  . Alcohol Use: No  . Drug Use: No  . Sexual Activity: Not on file   Other Topics Concern  . Not on file   Social History Narrative     Objective: BP 118/72 mmHg  Pulse 69  Wt 192 lb  (87.091 kg)  Vital signs reviewed. General: Alert and Oriented, No Acute Distress HEENT: Pupils equal, round, reactive to light. Conjunctivae clear.  External ears unremarkable.  Moist mucous membranes. Lungs: Clear and comfortable work of breathing, speaking in full sentences without accessory muscle use. Cardiac: Regular rate and rhythm.  Right shoulder exam reveals full range of motion and strength in all planes of motion and with individual rotator cuff testing. No overlying redness warmth or swelling.  Neer's test negative.  Hawkins test negative. Empty can negative. Crossarm test negative. O'Brien's test negative. Apprehension test negative. Speed's test negative. Mild reproduction of pain with palpation of the C6 spinous process no paraspinal cervicalmusculature hyper tonicity or pain with palpation. Neuro: CN II-XII grossly intact, gait normal. Extremities: No peripheral edema.  Strong peripheral pulses.  Mental Status: No depression, anxiety, nor agitation. Logical though process. Skin: Warm and dry.  Assessment & Plan: Stacy Woods was seen today for neck and shoulder pain.  Diagnoses and associated orders for this visit:  Cervical radiculitis - predniSONE (DELTASONE) 20 MG tablet; Three tabs at once daily for five days.    Suspect cervical radiculitis: She is wary about taking prednisone and does not like the idea about a taper over the  course of a week and a half therefore will try burst over 5 days alone. I've asked her to call me if no better on Friday  Return if symptoms worsen or fail to improve.

## 2014-07-21 ENCOUNTER — Ambulatory Visit: Payer: 59

## 2014-07-21 ENCOUNTER — Telehealth: Payer: Self-pay

## 2014-07-21 MED ORDER — TRAMADOL HCL 50 MG PO TABS: 50.0000 mg | ORAL_TABLET | Freq: Three times a day (TID) | ORAL | Status: DC | PRN

## 2014-07-21 NOTE — Telephone Encounter (Signed)
rx faxed notified pt

## 2014-07-21 NOTE — Telephone Encounter (Signed)
Stacy Woods reports still having neck, shoulder and arm pain. She was advised to call back if no improvement. Please advise.

## 2014-07-21 NOTE — Telephone Encounter (Signed)
Seth Bake, Rx placed in in-box ready for pickup/faxing. I'd recommend she start tramadol PRN pain and follow up with Dr. Darene Lamer in our sports medicine [facility] for further evaluation.

## 2014-07-27 ENCOUNTER — Encounter: Payer: Self-pay | Admitting: Sports Medicine

## 2014-07-27 ENCOUNTER — Ambulatory Visit (INDEPENDENT_AMBULATORY_CARE_PROVIDER_SITE_OTHER): Payer: 59

## 2014-07-27 ENCOUNTER — Ambulatory Visit (INDEPENDENT_AMBULATORY_CARE_PROVIDER_SITE_OTHER): Payer: 59 | Admitting: Sports Medicine

## 2014-07-27 VITALS — BP 105/66 | HR 90 | Ht 64.0 in | Wt 191.0 lb

## 2014-07-27 DIAGNOSIS — M5030 Other cervical disc degeneration, unspecified cervical region: Secondary | ICD-10-CM | POA: Insufficient documentation

## 2014-07-27 DIAGNOSIS — M5412 Radiculopathy, cervical region: Secondary | ICD-10-CM

## 2014-07-27 DIAGNOSIS — M47893 Other spondylosis, cervicothoracic region: Secondary | ICD-10-CM

## 2014-07-27 MED ORDER — CYCLOBENZAPRINE HCL 10 MG PO TABS: ORAL_TABLET | ORAL | Status: DC

## 2014-07-27 MED ORDER — NAPROXEN 500 MG PO TABS: 500.0000 mg | ORAL_TABLET | Freq: Two times a day (BID) | ORAL | Status: DC

## 2014-07-27 NOTE — Assessment & Plan Note (Signed)
There is likely an element of poor ergonomics as well as cervical degenerative disc disease, most likely C6-C7 and/or C7-T1. Prednisone was ineffective, adding naproxen, Flexeril at bedtime, and formal physical therapy. X-rays.  Return in one month, MRI for interventional injection planning if no better.

## 2014-07-27 NOTE — Progress Notes (Signed)
   Subjective:    I'm seeing this patient as a consultation for:  Dr. Ileene Rubens  CC: neck and right arm pain  HPI: This is a pleasant 49 year old female, she works in accounts with [employer], for the past several weeks she's had worsening pain in her neck described as a tearing sensation with radiation down the right arm in a C7 and to T1 distribution. Pain is moderate, persistent. She was given prednisone by Dr. Ileene Rubens which unfortunately has not been effective so far. No bowel or bladder dysfunction, trauma, or saddle numbness, no lower extremity symptoms.  Past medical history, Surgical history, Family history not pertinant except as noted below, Social history, Allergies, and medications have been entered into the medical record, reviewed, and no changes needed.   Review of Systems: No headache, visual changes, nausea, vomiting, diarrhea, constipation, dizziness, abdominal pain, skin rash, fevers, chills, night sweats, weight loss, swollen lymph nodes, body aches, joint swelling, muscle aches, chest pain, shortness of breath, mood changes, visual or auditory hallucinations.   Objective:   General: Well Developed, well nourished, and in no acute distress.  Neuro/Psych: Alert and oriented x3, extra-ocular muscles intact, able to move all 4 extremities, sensation grossly intact. Skin: Warm and dry, no rashes noted.  Respiratory: Not using accessory muscles, speaking in full sentences, trachea midline.  Cardiovascular: Pulses palpable, no extremity edema. Abdomen: Does not appear distended. Neck: Negative spurling's Full neck range of motion Grip strength and sensation normal in bilateral hands Strength good C4 to T1 distribution No sensory change to C4 to T1 Reflexes normal  X-rays personally reviewed and showed facet hypertrophy and C7-T1 as well as a decrease in disc space, lordosis is appropriate.  Impression and Recommendations:   This case required medical decision making of  moderate complexity.

## 2014-08-08 ENCOUNTER — Ambulatory Visit: Payer: 59 | Attending: Sports Medicine | Admitting: Physical Therapy

## 2014-08-08 ENCOUNTER — Encounter: Payer: Self-pay | Admitting: Physical Therapy

## 2014-08-08 DIAGNOSIS — M5412 Radiculopathy, cervical region: Secondary | ICD-10-CM | POA: Diagnosis not present

## 2014-08-08 DIAGNOSIS — M542 Cervicalgia: Secondary | ICD-10-CM | POA: Insufficient documentation

## 2014-08-08 NOTE — Therapy (Signed)
[address] [place], [place], [address] Phone: [phone]   Fax:  [phone]  Physical Therapy Evaluation  Patient Details  Name: Stacy Woods MRN: [id number] Date of Birth: 1965/07/03 Referring Provider:  Silverio Decamp,*  Encounter Date: 08/08/2014      PT End of Session - 08/08/14 1131    Visit Number 1   Date for PT Re-Evaluation 10/08/14   PT Start Time 1059   PT Stop Time 1153   PT Time Calculation (min) 54 min      Past Medical History  Diagnosis Date  . Hypertension   . Fibromyalgia     Past Surgical History  Procedure Laterality Date  . Tonsillectomy  1977  . Appendectomy    . Cholecystectomy    . Abdominal hysterectomy  1997  . Cesarean section  [id number]  . Knee arthroscopy  2002  . Gastric bypass  2011    There were no vitals taken for this visit.  Visit Diagnosis:  Cervical radiculopathy at C7 - Plan: PT plan of care cert/re-cert  Neck pain - Plan: PT plan of care cert/re-cert      Subjective Assessment - 08/08/14 1104    Symptoms Patient reports that she moved recently and thinks that all of the lifting caused pain.  C/O neck and right shoulder pain with tightness.  C/O tingling in an ulnar nerve pattern   Pertinent History fibromyalgia   Limitations Lifting;Reading;Writing;House hold activities   How long can you sit comfortably? 30 minutes   Diagnostic tests x-rays show some arthritic changes at C6-7 area   Patient Stated Goals no pain   Currently in Pain? Yes   Pain Score 7    Pain Location Neck   Pain Orientation Right   Pain Descriptors / Indicators Constant;Aching;Tingling;Spasm   Pain Type Acute pain   Pain Radiating Towards right ulnar type pattern   Pain Onset More than a month ago   Pain Frequency Constant   Aggravating Factors  sitting lifting and reaching    Pain Relieving Factors pain meds do not help much, heat pad   Effect of Pain on Daily  Activities just hurting all of the time          [facility] PT Assessment - 08/08/14 0001    Assessment   Medical Diagnosis cervical pain with radiculopathy   Onset Date 07/10/14   Next MD Visit August 31, 2014   Prior Therapy no   Precautions   Precautions None   Balance Screen   Has the patient fallen in the past 6 months No   Has the patient had a decrease in activity level because of a fear of falling?  No   Is the patient reluctant to leave their home because of a fear of falling?  No   Prior Function   Level of Independence Independent with basic ADLs;Independent with homemaking with ambulation   Vocation Requirements mostly sitting at desk   Leisure walks   Posture/Postural Control   Posture Comments significant fwd head, rounded shoulders   ROM / Strength   AROM / PROM / Strength --  Strength was WFL's for shoulders   AROM   Right Shoulder Flexion 160 Degrees   Right Shoulder ABduction 130 Degrees   Right Shoulder Internal Rotation 50 Degrees   Right Shoulder External Rotation 60 Degrees   Cervical Flexion decreased 25%   Cervical Extension decreased 25%   Cervical - Right Side  Bend decreased 50%   Cervical - Left Side Bend decreased 50%   Cervical - Right Rotation decreased 25%   Cervical - Left Rotation decreased 25%   Palpation   Palpation very tight and tender in the right upper trap, tender right posterior shoulder   Special Tests    Special Tests Rotator Cuff Impingement   Cervical Tests Dictraction   Rotator Cuff Impingment tests Michel Bickers test;Empty Can test   Distraction Test   Findngs --  increased pain in the right hand   Hawkins-Kennedy test   Findings Positive   Side Right   Empty Can test   Findings Positive   Comment strong but painful                  [facility] Adult PT Treatment/Exercise - 08/08/14 0001    Modalities   Modalities Electrical Stimulation   Electrical Stimulation   Electrical Stimulation Location right cervical  area   Electrical Stimulation Parameters IFC   Electrical Stimulation Goals Pain                PT Education - 08/08/14 1131    Education provided Yes   Education Details HEP for scapualr stabilization and cervical stabilization   Person(s) Educated Patient   Methods Explanation;Demonstration;Verbal cues;Handout   Comprehension Verbalized understanding;Returned demonstration          PT Short Term Goals - 08/08/14 1134    PT SHORT TERM GOAL #1   Title independent with initial HEP   Time 2   Period Weeks   Status New           PT Long Term Goals - 08/08/14 1135    PT LONG TERM GOAL #1   Title independent with proper posture while at work   Time 8   Period Weeks   Status New   PT LONG TERM GOAL #2   Title increase cervical ROM to WFL's   Time 8   Period Weeks   Status New   PT LONG TERM GOAL #3   Title decrease pain 50%   Time 8   Period Weeks   Status New   PT LONG TERM GOAL #4   Title ride motorcycle without difficulty   Time 8   Period Days   Status New               Plan - 08/08/14 1132    Clinical Impression Statement Patient with right cervical radiculopathy, ulnar pattern, she has mildly limited ROM of the cervical and shoulder area.  Significant spasms in the right upper trap.  Cervical distraction did not decreaes the pain   Pt will benefit from skilled therapeutic intervention in order to improve on the following deficits Decreased range of motion;Pain;Increased muscle spasms;Impaired flexibility   Rehab Potential Good   PT Frequency 2x / week   PT Duration 8 weeks   PT Treatment/Interventions Electrical Stimulation;Ultrasound;Traction;Moist Heat;Therapeutic activities;Therapeutic exercise;Patient/family education;Passive range of motion;Manual techniques   PT Next Visit Plan add exercises   Consulted and Agree with Plan of Care Patient         Problem List Patient Active Problem List   Diagnosis Date Noted  . Right  cervical radiculopathy 07/27/2014  . Prediabetes 09/01/2013  . Fibromyalgia 04/11/2013  . Essential hypertension, benign 04/11/2013  . Anxiety state, unspecified 04/11/2013  . Sebaceous cyst 04/11/2013  . Pyelonephritis 03/14/2013    Sumner Boast, PT 08/08/2014, 11:38 AM  [facility] [facility] [address]  [address], [place], [address] Phone: [phone]   Fax:  [phone]

## 2014-08-24 ENCOUNTER — Ambulatory Visit: Payer: 59 | Admitting: Physical Therapy

## 2014-08-24 ENCOUNTER — Encounter: Payer: Self-pay | Admitting: Physical Therapy

## 2014-08-24 DIAGNOSIS — M5412 Radiculopathy, cervical region: Secondary | ICD-10-CM

## 2014-08-24 NOTE — Therapy (Signed)
[place], [place], [address] Phone: [phone]   Fax:  [phone]  Physical Therapy Treatment  Patient Details  Name: Stacy Woods MRN: [id number] Date of Birth: 1966-01-01 Referring Provider:  Silverio Decamp,*  Encounter Date: 08/24/2014      PT End of Session - 08/24/14 1033    Visit Number 2   PT Start Time 1010   PT Stop Time 1100   PT Time Calculation (min) 50 min      Past Medical History  Diagnosis Date  . Hypertension   . Fibromyalgia     Past Surgical History  Procedure Laterality Date  . Tonsillectomy  1977  . Appendectomy    . Cholecystectomy    . Abdominal hysterectomy  1997  . Cesarean section  [id number]  . Knee arthroscopy  2002  . Gastric bypass  2011    There were no vitals filed for this visit.  Visit Diagnosis:  Cervical radiculopathy at C7      Subjective Assessment - 08/24/14 1012    Symptoms HEP is fine, pain worse at night   Currently in Pain? Yes   Pain Score 5    Pain Location Neck                       [facility] Adult PT Treatment/Exercise - 08/24/14 0001    Posture/Postural Control   Posture Comments postural discussion for sitting, lumbar roll, sitting and desk ergo    Exercises   Exercises Neck   Neck Exercises: Machines for Strengthening   UBE (Upper Arm Bike) L3 2 fwd/2 back   Cybex Row 15 # 2 sets 15   Other Machines for Strengthening lat pull 15# 2 sets 10   Other Machines for Strengthening ball vs wall 5 times CC and CW   Neck Exercises: Seated   Neck Retraction 15 reps   Electrical Stimulation   Electrical Stimulation Location right cervical area   Electrical Stimulation Parameters IFC   Electrical Stimulation Goals Pain   Traction   Type of Traction Cervical   Max (lbs) 12   Time 10   Manual Therapy   Manual Therapy Other (comment)  neural tension stretching, all negative                PT  Education - 08/24/14 1032    Education Details posture and desk egro set up   [employer]) Educated Patient   Methods Explanation;Demonstration   Comprehension Verbalized understanding;Returned demonstration          PT Short Term Goals - 08/24/14 1033    PT SHORT TERM GOAL #1   Title independent with initial HEP   Status Achieved           PT Long Term Goals - 08/24/14 1033    PT LONG TERM GOAL #1   Title independent with proper posture while at work   Status Achieved   PT LONG TERM GOAL #2   Title increase cervical ROM to WFL's   Status On-going   PT LONG TERM GOAL #3   Title decrease pain 50%   Status On-going               Problem List Patient Active Problem List   Diagnosis Date Noted  . Right cervical radiculopathy 07/27/2014  . Prediabetes 09/01/2013  . Fibromyalgia 04/11/2013  . Essential hypertension, benign 04/11/2013  . Anxiety state, unspecified 04/11/2013  .  Sebaceous cyst 04/11/2013  . Pyelonephritis 03/14/2013    Taressa Rauh,ANGIE,PTA 08/24/2014, 10:35 AM  [address] [place], [place], [address] Phone: [phone]   Fax:  [phone]

## 2014-08-29 ENCOUNTER — Ambulatory Visit: Payer: 59 | Admitting: Physical Therapy

## 2014-08-29 DIAGNOSIS — M5412 Radiculopathy, cervical region: Secondary | ICD-10-CM | POA: Diagnosis not present

## 2014-08-29 DIAGNOSIS — M542 Cervicalgia: Secondary | ICD-10-CM

## 2014-08-29 NOTE — Therapy (Signed)
[place], [place], [address] Phone: [phone]   Fax:  [phone]  Physical Therapy Treatment  Patient Details  Name: Stacy Woods MRN: [id number] Date of Birth: 02-14-1966 Referring Provider:  Hilton Sinclair, *  Encounter Date: 08/29/2014      PT End of Session - 08/29/14 0928    Visit Number 3   Date for PT Re-Evaluation 10/08/14   PT Start Time 0846   PT Stop Time 0945   PT Time Calculation (min) 59 min   Activity Tolerance Patient tolerated treatment well   Behavior During Therapy [facility] for tasks assessed/performed      Past Medical History  Diagnosis Date  . Hypertension   . Fibromyalgia     Past Surgical History  Procedure Laterality Date  . Tonsillectomy  1977  . Appendectomy    . Cholecystectomy    . Abdominal hysterectomy  1997  . Cesarean section  [id number]  . Knee arthroscopy  2002  . Gastric bypass  2011    There were no vitals filed for this visit.  Visit Diagnosis:  Cervical radiculopathy at C7  Neck pain      Subjective Assessment - 08/29/14 0849    Symptoms R Shoulder and neck is really bad this morning.  Needed 3 days to recover from last session.   Pertinent History fibromyalgia   Limitations Lifting;Reading;Writing;House hold activities   How long can you sit comfortably? 30 minutes   Diagnostic tests x-rays show some arthritic changes at C6-7 area   Patient Stated Goals no pain   Currently in Pain? Yes   Pain Score 9    Pain Location Neck   Pain Orientation Right   Pain Descriptors / Indicators Aching;Tingling;Spasm;Constant   Pain Type Acute pain   Pain Onset More than a month ago   Pain Frequency Constant                       [facility] Adult PT Treatment/Exercise - 08/29/14 0851    Neck Exercises: Machines for Strengthening   UBE (Upper Arm Bike) L2 2 min forward/2 min backward   Neck Exercises: Supine   Neck Retraction 3  secs;15 reps   Shoulder ABduction Both;15 reps   Shoulder Abduction Limitations horizontal abdct with yellow theraband   Upper Extremity Flexion with Stabilization Flexion;15 reps   UE Flexion with Stabilization Limitations yellow theraband   Upper Extremity D1 Flexion;Extension;15 reps;Theraband   Theraband Level (UE D1) Level 1 (Yellow)   Upper Extremity D2 Flexion;Extension;15 reps;Theraband   Theraband Level (UE D2) Level 1 (Yellow)   Other Supine Exercise supine on foam roll x 3 min for chest stretch   Other Supine Exercise all exercises above performed on foam roll   Modalities   Modalities Electrical Stimulation;Moist Heat   Moist Heat Therapy   Number Minutes Moist Heat 15 Minutes   Moist Heat Location Other (comment)  neck   Electrical Stimulation   Electrical Stimulation Location right cervical area   Electrical Stimulation Action IFC   Electrical Stimulation Parameters to tolerance   Electrical Stimulation Goals Pain   Manual Therapy   Manual Therapy Myofascial release   Myofascial Release R upper trap and cervical paraspinals; trigger point release R upper trap x 16 min                  PT Short Term Goals - 08/24/14 1033    PT  SHORT TERM GOAL #1   Title independent with initial HEP   Status Achieved           PT Long Term Goals - 08/24/14 1033    PT LONG TERM GOAL #1   Title independent with proper posture while at work   Status Achieved   PT LONG TERM GOAL #2   Title increase cervical ROM to WFL's   Status On-going   PT LONG TERM GOAL #3   Title decrease pain 50%   Status On-going               Plan - 08/29/14 9381    Clinical Impression Statement Pt with increased pain following last session needing multiple days to recover.  Will plan to hold traction at this time and reassess as needed.   PT Next Visit Plan add exercises   Consulted and Agree with Plan of Care Patient        Problem List Patient Active Problem List    Diagnosis Date Noted  . Right cervical radiculopathy 07/27/2014  . Prediabetes 09/01/2013  . Fibromyalgia 04/11/2013  . Essential hypertension, benign 04/11/2013  . Anxiety state, unspecified 04/11/2013  . Sebaceous cyst 04/11/2013  . Pyelonephritis 03/14/2013   Laureen Abrahams, PT, DPT 08/29/2014 9:58 AM  [place] [address] [place], [place], [address] Phone: [phone]   Fax:  [phone]

## 2014-08-31 ENCOUNTER — Ambulatory Visit (INDEPENDENT_AMBULATORY_CARE_PROVIDER_SITE_OTHER): Payer: 59 | Admitting: Sports Medicine

## 2014-08-31 ENCOUNTER — Encounter: Payer: Self-pay | Admitting: Sports Medicine

## 2014-08-31 DIAGNOSIS — M5412 Radiculopathy, cervical region: Secondary | ICD-10-CM | POA: Diagnosis not present

## 2014-08-31 MED ORDER — DIAZEPAM 5 MG PO TABS: ORAL_TABLET | ORAL | Status: DC

## 2014-08-31 NOTE — Progress Notes (Signed)
  Subjective:    CC:  Follow-up  HPI: Right C8 and T1 radiculopathy: Unfortunately persistent symptoms despite 6 weeks of formal physical therapy, NSAIDs, steroids, muscle relaxers.  Past medical history, Surgical history, Family history not pertinant except as noted below, Social history, Allergies, and medications have been entered into the medical record, reviewed, and no changes needed.   Review of Systems: No fevers, chills, night sweats, weight loss, chest pain, or shortness of breath.   Objective:    General: Well Developed, well nourished, and in no acute distress.  Neuro: Alert and oriented x3, extra-ocular muscles intact, sensation grossly intact.  HEENT: Normocephalic, atraumatic, pupils equal round reactive to light, neck supple, no masses, no lymphadenopathy, thyroid nonpalpable.  Skin: Warm and dry, no rashes. Cardiac: Regular rate and rhythm, no murmurs rubs or gallops, no lower extremity edema.  Respiratory: Clear to auscultation bilaterally. Not using accessory muscles, speaking in full sentences.  Impression and Recommendations:

## 2014-08-31 NOTE — Assessment & Plan Note (Signed)
C8 and T1 distribution, symptoms do not sound like cubital tunnel syndrome. Neck is with significant axial pain. Failed 6 weeks of physical therapy, prednisone, NSAIDs. At this point we are going to proceed with an MRI for interventional injection planning. Neck Return to go over MRI results. Valium for preprocedural anxiolysis for claustrophobia.

## 2014-09-07 ENCOUNTER — Ambulatory Visit (INDEPENDENT_AMBULATORY_CARE_PROVIDER_SITE_OTHER): Payer: 59

## 2014-09-07 DIAGNOSIS — M5082 Other cervical disc disorders, mid-cervical region: Secondary | ICD-10-CM | POA: Diagnosis not present

## 2014-09-07 DIAGNOSIS — M5412 Radiculopathy, cervical region: Secondary | ICD-10-CM

## 2014-09-11 ENCOUNTER — Encounter: Payer: Self-pay | Admitting: Sports Medicine

## 2014-09-14 ENCOUNTER — Encounter: Payer: Self-pay | Admitting: Sports Medicine

## 2014-09-14 ENCOUNTER — Ambulatory Visit (INDEPENDENT_AMBULATORY_CARE_PROVIDER_SITE_OTHER): Payer: 59 | Admitting: Sports Medicine

## 2014-09-14 VITALS — BP 112/77 | HR 89 | Ht 64.0 in | Wt 192.0 lb

## 2014-09-14 DIAGNOSIS — M7701 Medial epicondylitis, right elbow: Secondary | ICD-10-CM

## 2014-09-14 DIAGNOSIS — G5621 Lesion of ulnar nerve, right upper limb: Secondary | ICD-10-CM | POA: Insufficient documentation

## 2014-09-14 DIAGNOSIS — M5030 Other cervical disc degeneration, unspecified cervical region: Secondary | ICD-10-CM | POA: Diagnosis not present

## 2014-09-14 NOTE — Assessment & Plan Note (Signed)
Common flexor tendon injection as above. Home rehabilitation exercises.

## 2014-09-14 NOTE — Progress Notes (Signed)
  Subjective:    CC: Follow-up  HPI: Stacy Woods returns to go over MRI results, she has fibromyalgia but also severe neck pain with radiation down the right arm in what seemed to be a C8 and T1 distribution. Unfortunately she has not responded to other conservative measures. She has never had a nerve conduction study. MRI results will be dictated below, however overall was for the most part negative. She does have significant pain on the medial part of the elbow. Moderate, persistent with radiation down the inner forearm .  Past medical history, Surgical history, Family history not pertinant except as noted below, Social history, Allergies, and medications have been entered into the medical record, reviewed, and no changes needed.   Review of Systems: No fevers, chills, night sweats, weight loss, chest pain, or shortness of breath.   Objective:    General: Well Developed, well nourished, and in no acute distress.  Neuro: Alert and oriented x3, extra-ocular muscles intact, sensation grossly intact.  HEENT: Normocephalic, atraumatic, pupils equal round reactive to light, neck supple, no masses, no lymphadenopathy, thyroid nonpalpable.  Skin: Warm and dry, no rashes. Cardiac: Regular rate and rhythm, no murmurs rubs or gallops, no lower extremity edema.  Respiratory: Clear to auscultation bilaterally. Not using accessory muscles, speaking in full sentences. Right Elbow: Unremarkable to inspection. Range of motion full pronation, supination, flexion, extension. Strength is full to all of the above directions Stable to varus, valgus stress. Negative moving valgus stress test. Tender to palpation at the origin of the common flexor tendon on the medial epicondyle. Ulnar nerve does not sublux. Negative cubital tunnel Tinel's.  MRI reviewed and shows very small C5-C6 and C6-C7 disc protrusions without any evidence of foraminal or central canal stenosis.  Procedure: Real-time Ultrasound Guided  Injection of right common flexor tendon origin Device: GE Logiq E  Verbal informed consent obtained.  Time-out conducted.  Noted no overlying erythema, induration, or other signs of local infection.  Skin prepped in a sterile fashion.  Local anesthesia: Topical Ethyl chloride.  With sterile technique and under real time ultrasound guidance:  Using a total of 1 mL kenalog 40, 4 mL lidocaine injected medication both superficial to and deep to the common flexor tendon origin. Completed without difficulty  Pain did not immediately resolve suggesting a secondary pain generator is present. Advised to call if fevers/chills, erythema, induration, drainage, or persistent bleeding.  Images permanently stored and available for review in the ultrasound unit.  Impression: Technically successful ultrasound guided injection.  Impression and Recommendations:

## 2014-09-14 NOTE — Assessment & Plan Note (Addendum)
C5-6 and C6-7, there does not appear to be any evidence of foraminal stenosis or central canal stenosis. She does have neck pain, certainly we could do an epidural considering her small disc protrusions. I would like her to call back in 2 weeks if the injection in her elbow does not help, and then we can proceed with an epidural. She does have some ulnar neuropathy type symptoms, I'm going to order nerve conduction study in the meantime.

## 2014-09-28 ENCOUNTER — Encounter: Payer: Self-pay | Admitting: Sports Medicine

## 2014-09-28 NOTE — Addendum Note (Signed)
Addended by: Silverio Decamp on: 09/28/2014 05:18 PM   Modules accepted: Orders

## 2014-10-12 ENCOUNTER — Encounter: Payer: Self-pay | Admitting: Sports Medicine

## 2014-10-13 ENCOUNTER — Encounter: Payer: Self-pay | Admitting: Sports Medicine

## 2014-10-31 ENCOUNTER — Encounter: Payer: Self-pay | Admitting: Sports Medicine

## 2014-10-31 ENCOUNTER — Ambulatory Visit (INDEPENDENT_AMBULATORY_CARE_PROVIDER_SITE_OTHER): Payer: 59 | Admitting: Sports Medicine

## 2014-10-31 VITALS — BP 118/80 | HR 79 | Ht 64.0 in | Wt 188.0 lb

## 2014-10-31 DIAGNOSIS — M5030 Other cervical disc degeneration, unspecified cervical region: Secondary | ICD-10-CM | POA: Diagnosis not present

## 2014-10-31 DIAGNOSIS — G5621 Lesion of ulnar nerve, right upper limb: Secondary | ICD-10-CM

## 2014-10-31 NOTE — Assessment & Plan Note (Signed)
Very small disc protrusions at C5-6 and C6-7. There is no foraminal or central canal stenosis. We will see how she does regarding her elbow, and if the neck does not improve we can certainly consider an epidural versus facet blocks.

## 2014-10-31 NOTE — Progress Notes (Signed)
  Subjective:    CC: Follow-up elbow pain  HPI: Right cubital tunnel syndrome: EMG and nerve conduction confirmed, she did not respond initially to a golfer's elbow injection, but subsequently nerve conduction did show cubital tunnel syndrome and she is here for ulnar nerve hydrodissection.  Neck pain: Amenable to discuss this at a future visit.  Past medical history, Surgical history, Family history not pertinant except as noted below, Social history, Allergies, and medications have been entered into the medical record, reviewed, and no changes needed.   Review of Systems: No fevers, chills, night sweats, weight loss, chest pain, or shortness of breath.   Objective:    General: Well Developed, well nourished, and in no acute distress.  Neuro: Alert and oriented x3, extra-ocular muscles intact, sensation grossly intact.  HEENT: Normocephalic, atraumatic, pupils equal round reactive to light, neck supple, no masses, no lymphadenopathy, thyroid nonpalpable.  Skin: Warm and dry, no rashes. Cardiac: Regular rate and rhythm, no murmurs rubs or gallops, no lower extremity edema.  Respiratory: Clear to auscultation bilaterally. Not using accessory muscles, speaking in full sentences.  Procedure: Real-time Ultrasound Guided hydrodissection of right ulnar nerve in the cubital tunnel Device: GE Logiq E  Verbal informed consent obtained.  Time-out conducted.  Noted no overlying erythema, induration, or other signs of local infection.  Skin prepped in a sterile fashion.  Local anesthesia: Topical Ethyl chloride.  With sterile technique and under real time ultrasound guidance:  Visualized the ulnar nerve in the tunnel, a total of 1 mL kenalog 40 and 4 mL lidocaine was injected both superficial to and deep to the nerve freeing it from surrounding structures. Completed without difficulty  Pain immediately resolved suggesting accurate placement of the medication.  Advised to call if fevers/chills,  erythema, induration, drainage, or persistent bleeding.  Images permanently stored and available for review in the ultrasound unit.  Impression: Technically successful ultrasound guided injection.  Impression and Recommendations:

## 2014-10-31 NOTE — Assessment & Plan Note (Signed)
Ulnar nerve hydrodissection as above in the cubital tunnel. I have recommended that she do her elbow sleeve at night in an effort to encourage avoidance of flexion. Return to see me in 3 weeks.

## 2014-11-09 ENCOUNTER — Encounter: Payer: Self-pay | Admitting: Sports Medicine

## 2014-11-09 DIAGNOSIS — M47812 Spondylosis without myelopathy or radiculopathy, cervical region: Secondary | ICD-10-CM

## 2014-11-10 NOTE — Addendum Note (Signed)
Addended by: Silverio Decamp on: 11/10/2014 05:36 PM   Modules accepted: Orders

## 2014-11-10 NOTE — Telephone Encounter (Signed)
Epidural ordered, please scheduled with [facility], thanks!

## 2014-11-13 ENCOUNTER — Encounter: Payer: Self-pay | Admitting: Family Medicine

## 2014-11-20 ENCOUNTER — Ambulatory Visit
Admission: RE | Admit: 2014-11-20 | Discharge: 2014-11-20 | Disposition: A | Discharge status: Home / Self Care | Payer: 59 | Source: Ambulatory Visit | Attending: Sports Medicine | Admitting: Sports Medicine

## 2014-11-20 VITALS — BP 120/83 | HR 62

## 2014-11-20 DIAGNOSIS — M5030 Other cervical disc degeneration, unspecified cervical region: Secondary | ICD-10-CM

## 2014-11-20 MED ORDER — IOHEXOL 180 MG/ML  SOLN
1.0000 mL | Freq: Once | INTRAMUSCULAR | Status: AC | PRN
  Administered 2014-11-20: 1 mL via EPIDURAL

## 2014-11-20 MED ORDER — METHYLPREDNISOLONE ACETATE 40 MG/ML INJ SUSP (RADIOLOG
120.0000 mg | Freq: Once | INTRAMUSCULAR | Status: AC
  Administered 2014-11-20: 120 mg via EPIDURAL

## 2014-11-20 NOTE — Discharge Instructions (Signed)

## 2014-12-12 ENCOUNTER — Encounter: Payer: Self-pay | Admitting: Family Medicine

## 2014-12-12 DIAGNOSIS — F411 Generalized anxiety disorder: Secondary | ICD-10-CM

## 2014-12-12 MED ORDER — ESCITALOPRAM OXALATE 10 MG PO TABS: 10.0000 mg | ORAL_TABLET | Freq: Every day | ORAL | Status: DC

## 2014-12-12 NOTE — Telephone Encounter (Signed)
Are you ok with refilling this for her?  Looks like she was supposed to had followed up for this a long time ago.

## 2015-01-16 ENCOUNTER — Ambulatory Visit (INDEPENDENT_AMBULATORY_CARE_PROVIDER_SITE_OTHER): Payer: 59 | Admitting: Family Medicine

## 2015-01-16 ENCOUNTER — Encounter: Payer: Self-pay | Admitting: Family Medicine

## 2015-01-16 ENCOUNTER — Ambulatory Visit (HOSPITAL_BASED_OUTPATIENT_CLINIC_OR_DEPARTMENT_OTHER)
Admission: RE | Admit: 2015-01-16 | Discharge: 2015-01-16 | Disposition: A | Discharge status: Home / Self Care | Payer: 59 | Source: Ambulatory Visit | Attending: Family Medicine | Admitting: Family Medicine

## 2015-01-16 ENCOUNTER — Other Ambulatory Visit: Payer: Self-pay | Admitting: Family Medicine

## 2015-01-16 VITALS — BP 108/74 | HR 77 | Wt 191.0 lb

## 2015-01-16 DIAGNOSIS — R600 Localized edema: Secondary | ICD-10-CM | POA: Insufficient documentation

## 2015-01-16 DIAGNOSIS — M7989 Other specified soft tissue disorders: Secondary | ICD-10-CM

## 2015-01-16 DIAGNOSIS — I82401 Acute embolism and thrombosis of unspecified deep veins of right lower extremity: Secondary | ICD-10-CM

## 2015-01-16 MED ORDER — RIVAROXABAN (XARELTO) VTE STARTER PACK (15 & 20 MG): ORAL_TABLET | ORAL | Status: DC

## 2015-01-16 NOTE — Patient Instructions (Signed)
Thank you for coming in today. Get the ultrasound today.  I will call you with results tonight or tomorrow morning.  IF ultrasound is negative for DVT follow up with me if not better.   Deep Vein Thrombosis A deep vein thrombosis (DVT) is a blood clot that develops in the deep, larger veins of the leg, arm, or pelvis. These are more dangerous than clots that might form in veins near the surface of the body. A DVT can lead to serious and even life-threatening complications if the clot breaks off and travels in the bloodstream to the lungs.  A DVT can damage the valves in your leg veins so that instead of flowing upward, the blood pools in the lower leg. This is called post-thrombotic syndrome, and it can result in pain, swelling, discoloration, and sores on the leg. CAUSES Usually, several things contribute to the formation of blood clots. Contributing factors include:  The flow of blood slows down.  The inside of the vein is damaged in some way.  You have a condition that makes blood clot more easily. RISK FACTORS Some people are more likely than others to develop blood clots. Risk factors include:   Smoking.  Being overweight (obese).  Sitting or lying still for a long time. This includes long-distance travel, paralysis, or recovery from an illness or surgery. Other factors that increase risk are:   Older age, especially over 8 years of age.  Having a family history of blood clots or if you have already had a blot clot.  Having major or lengthy surgery. This is especially true for surgery on the hip, knee, or belly (abdomen). Hip surgery is particularly high risk.  Having a long, thin tube (catheter) placed inside a vein during a medical procedure.  Breaking a hip or leg.  Having cancer or cancer treatment.  Pregnancy and childbirth.  Hormone changes make the blood clot more easily during pregnancy.  The fetus puts pressure on the veins of the pelvis.  There is a risk of  injury to veins during delivery or a caesarean delivery. The risk is highest just after childbirth.  Medicines containing the female hormone estrogen. This includes birth control pills and hormone replacement therapy.  Other circulation or heart problems.  SIGNS AND SYMPTOMS When a clot forms, it can either partially or totally block the blood flow in that vein. Symptoms of a DVT can include:  Swelling of the leg or arm, especially if one side is much worse.  Warmth and redness of the leg or arm, especially if one side is much worse.  Pain in an arm or leg. If the clot is in the leg, symptoms may be more noticeable or worse when standing or walking. The symptoms of a DVT that has traveled to the lungs (pulmonary embolism, PE) usually start suddenly and include:  Shortness of breath.  Coughing.  Coughing up blood or blood-tinged mucus.  Chest pain. The chest pain is often worse with deep breaths.  Rapid heartbeat. Anyone with these symptoms should get emergency medical treatment right away. Do not wait to see if the symptoms will go away. Call your local emergency services (911 in the [place] if you have these symptoms. Do not drive yourself to the hospital. DIAGNOSIS If a DVT is suspected, your health care provider will take a full medical history and perform a physical exam. Tests that also may be required include:  Blood tests, including studies of the clotting properties of the blood.  Ultrasound to see if you have clots in your legs or lungs.  X-rays to show the flow of blood when dye is injected into the veins (venogram).  Studies of your lungs if you have any chest symptoms. PREVENTION  Exercise the legs regularly. Take a brisk 30-minute walk every day.  Maintain a weight that is appropriate for your height.  Avoid sitting or lying in bed for long periods of time without moving your legs.  Women, particularly those over the age of 34 years, should consider the risks  and benefits of taking estrogen medicines, including birth control pills.  Do not smoke, especially if you take estrogen medicines.  Long-distance travel can increase your risk of DVT. You should exercise your legs by walking or pumping the muscles every hour.  Many of the risk factors above relate to situations that exist with hospitalization, either for illness, injury, or elective surgery. Prevention may include medical and nonmedical measures.  Your health care provider will assess you for the need for venous thromboembolism prevention when you are admitted to the hospital. If you are having surgery, your surgeon will assess you the day of or day after surgery. TREATMENT Once identified, a DVT can be treated. It can also be prevented in some circumstances. Once you have had a DVT, you may be at increased risk for a DVT in the future. The most common treatment for DVT is blood-thinning (anticoagulant) medicine, which reduces the blood's tendency to clot. Anticoagulants can stop new blood clots from forming and stop old clots from growing. They cannot dissolve existing clots. Your body does this by itself over time. Anticoagulants can be given by mouth, through an IV tube, or by injection. Your health care provider will determine the best program for you. Other medicines or treatments that may be used are:  Heparin or related medicines (low molecular weight heparin) are often the first treatment for a blood clot. They act quickly. However, they cannot be taken orally and must be given either in shot form or by IV tube.  Heparin can cause a fall in a component of blood that stops bleeding and forms blood clots (platelets). You will be monitored with blood tests to be sure this does not occur.  Warfarin is an anticoagulant that can be swallowed. It takes a few days to start working, so usually heparin or related medicines are used in combination. Once warfarin is working, heparin is usually  stopped.  Factor Xa inhibitor medicines, such as rivaroxaban and apixaban, also reduce blood clotting. These medicines are taken orally and can often be used without heparin or related medicines.  Less commonly, clot dissolving drugs (thrombolytics) are used to dissolve a DVT. They carry a high risk of bleeding, so they are used mainly in severe cases where your life or a part of your body is threatened.  Very rarely, a blood clot in the leg needs to be removed surgically.  If you are unable to take anticoagulants, your health care provider may arrange for you to have a filter placed in a main vein in your abdomen. This filter prevents clots from traveling to your lungs. HOME CARE INSTRUCTIONS  Take all medicines as directed by your health care provider.  Learn as much as you can about DVT.  Wear a medical alert bracelet or carry a medical alert card.  Ask your health care provider how soon you can go back to normal activities. It is important to stay active to prevent blood clots.  If you are on anticoagulant medicine, avoid contact sports.  It is very important to exercise. This is especially important while traveling, sitting, or standing for long periods of time. Exercise your legs by walking or by tightening and relaxing your leg muscles regularly. Take frequent walks.  You may need to wear compression stockings. These are tight elastic stockings that apply pressure to the lower legs. This pressure can help keep the blood in the legs from clotting. Taking Warfarin Warfarin is a daily medicine that is taken by mouth. Your health care provider will advise you on the length of treatment (usually 3-6 months, sometimes lifelong). If you take warfarin:  Understand how to take warfarin and foods that can affect how warfarin works in Veterinary surgeon.  Too much and too little warfarin are both dangerous. Too much warfarin increases the risk of bleeding. Too little warfarin continues to allow the risk  for blood clots. Warfarin and Regular Blood Testing While taking warfarin, you will need to have regular blood tests to measure your blood clotting time. These blood tests usually include both the prothrombin time (PT) and international normalized ratio (INR) tests. The PT and INR results allow your health care provider to adjust your dose of warfarin. It is very important that you have your PT and INR tested as often as directed by your health care provider.  Warfarin and Your Diet Avoid major changes in your diet, or notify your health care provider before changing your diet. Arrange a visit with a registered dietitian to answer your questions. Many foods, especially foods high in vitamin K, can interfere with warfarin and affect the PT and INR results. You should eat a consistent amount of foods high in vitamin K. Foods high in vitamin K include:   Spinach, kale, broccoli, cabbage, collard and turnip greens, Brussels sprouts, peas, cauliflower, seaweed, and parsley.  Beef and pork liver.  Green tea.  Soybean oil. Warfarin with Other Medicines Many medicines can interfere with warfarin and affect the PT and INR results. You must:  Tell your health care provider about any and all medicines, vitamins, and supplements you take, including aspirin and other over-the-counter anti-inflammatory medicines. Be especially cautious with aspirin and anti-inflammatory medicines. Ask your health care provider before taking these.  Do not take or discontinue any prescribed or over-the-counter medicine except on the advice of your health care provider or pharmacist. Warfarin Side Effects Warfarin can have side effects, such as easy bruising and difficulty stopping bleeding. Ask your health care provider or pharmacist about other side effects of warfarin. You will need to:  Hold pressure over cuts for longer than usual.  Notify your dentist and other health care providers that you are taking warfarin  before you undergo any procedures where bleeding may occur. Warfarin with Alcohol and Tobacco   Drinking alcohol frequently can increase the effect of warfarin, leading to excess bleeding. It is best to avoid alcoholic drinks or to consume only very small amounts while taking warfarin. Notify your health care provider if you change your alcohol intake.   Do not use any tobacco products including cigarettes, chewing tobacco, or electronic cigarettes. If you smoke, quit. Ask your health care provider for help with quitting smoking. Alternative Medicines to Warfarin: Factor Xa Inhibitor Medicines  These blood-thinning medicines are taken by mouth, usually for several weeks or longer. It is important to take the medicine every single day at the same time each day.  There are no regular blood tests required  when using these medicines.  There are fewer food and drug interactions than with warfarin.  The side effects of this class of medicine are similar to those of warfarin, including excessive bruising or bleeding. Ask your health care provider or pharmacist about other potential side effects. SEEK MEDICAL CARE IF:  You notice a rapid heartbeat.  You feel weaker or more tired than usual.  You feel faint.  You notice increased bruising.  You feel your symptoms are not getting better in the time expected.  You believe you are having side effects of medicine. SEEK IMMEDIATE MEDICAL CARE IF:  You have chest pain.  You have trouble breathing.  You have new or increased swelling or pain in one leg.  You cough up blood.  You notice blood in vomit, in a bowel movement, or in urine. MAKE SURE YOU:  Understand these instructions.  Will watch your condition.  Will get help right away if you are not doing well or get worse. Document Released: 05/26/2005 Document Revised: 10/10/2013 Document Reviewed: 01/31/2013 [facility] Patient Information 2015 [place], [place]. This information is not  intended to replace advice given to you by your health care provider. Make sure you discuss any questions you have with your health care provider.

## 2015-01-16 NOTE — Assessment & Plan Note (Signed)
Muscle strain and tear versus DVT.  Stat US duplex doppler ordered and will be done today at 5:30 in [facility].  I have written pt a script for Xeralto starter pack that she will take if [place] positive.  I should be called with results and will contact pt however if the call goes to the on call Dr. The patient can be reached at [phone].

## 2015-01-16 NOTE — Progress Notes (Signed)
Stacy Woods is a 48 y.o. female who presents to [facility]  today for Right leg pain and swelling.  She injured her knee in a company event this weekend. Since then she's noted right posterior knee pain and swelling and bruising. She notes leg swelling distal to the knee starting today and yesterday. She denies any chest pains palpitations or shortness of breath. She has not tried any treatment yet. She feels well otherwise with no fevers or chills. She is concerned she may have a DVT.   Past Medical History  Diagnosis Date  . Hypertension   . Fibromyalgia    Past Surgical History  Procedure Laterality Date  . Tonsillectomy  1977  . Appendectomy    . Cholecystectomy    . Abdominal hysterectomy  1997  . Cesarean section  [id number]  . Knee arthroscopy  2002  . Gastric bypass  2011   History  Substance Use Topics  . Smoking status: Never Smoker   . Smokeless tobacco: Not on file  . Alcohol Use: No   ROS as above Medications: Current Outpatient Prescriptions  Medication Sig Dispense Refill  . escitalopram (LEXAPRO) 10 MG tablet Take 1 tablet (10 mg total) by mouth daily. 90 tablet 3  . gabapentin (NEURONTIN) 300 MG capsule One by mouth at bedtime to prevent fibromyalgia.  If needed may add a dose at breakfast and lunch. 90 capsule 3  . lisinopril-hydrochlorothiazide (ZESTORETIC) 20-12.5 MG per tablet Take 1 tablet by mouth daily. 90 tablet 3  . naproxen (NAPROSYN) 500 MG tablet Take 1 tablet (500 mg total) by mouth 2 (two) times daily with a meal. 60 tablet 3  . traMADol (ULTRAM) 50 MG tablet Take 1 tablet (50 mg total) by mouth every 8 (eight) hours as needed for moderate pain. 30 tablet 0  . Rivaroxaban (XARELTO STARTER PACK) 15 & 20 MG TBPK Take as directed on package: Start with one 15mg  tablet by mouth twice a day with food. On Day 22, switch to one 20mg  tablet once a day with food. 51 each 0   No current facility-administered medications for  this visit.   Allergies  Allergen Reactions  . Mobic [Meloxicam] Swelling  . Shellfish Allergy Nausea Only and Swelling  . Lyrica [Pregabalin] Other (See Comments)    "zombie"     Exam:  BP 108/74 mmHg  Pulse 77  Wt 191 lb (86.637 kg) Gen: Well NAD HEENT: EOMI,  MMM Lungs: Normal work of breathing. CTABL Heart: RRR no MRG Abd: NABS, Soft. Nondistended, Nontender Exts: Brisk capillary refill, warm and well perfused. Right posterior knee tenderness swelling and ecchymosis present with a palpable cord. The knee is free of effusion with normal range of motion. The right calf diameter is greater than the left.  No results found for this or any previous visit (from the past 24 hour(s)). No results found.   Please see individual assessment and plan sections.

## 2015-01-17 ENCOUNTER — Telehealth: Payer: Self-pay | Admitting: Family Medicine

## 2015-01-17 NOTE — Telephone Encounter (Signed)
I called Ms Stacy Woods last night and discussed the results of the doppler US. She will follow up with me PRN for leg pain.

## 2015-01-30 ENCOUNTER — Other Ambulatory Visit: Payer: Self-pay | Admitting: Family Medicine

## 2015-04-30 ENCOUNTER — Encounter: Payer: 59 | Admitting: Family Medicine

## 2015-05-01 ENCOUNTER — Encounter: Payer: Self-pay | Admitting: Family Medicine

## 2015-05-01 ENCOUNTER — Ambulatory Visit (INDEPENDENT_AMBULATORY_CARE_PROVIDER_SITE_OTHER): Payer: 59 | Admitting: Family Medicine

## 2015-05-01 VITALS — BP 114/73 | HR 64 | Wt 187.0 lb

## 2015-05-01 DIAGNOSIS — Z1239 Encounter for other screening for malignant neoplasm of breast: Secondary | ICD-10-CM

## 2015-05-01 DIAGNOSIS — G4700 Insomnia, unspecified: Secondary | ICD-10-CM | POA: Diagnosis not present

## 2015-05-01 DIAGNOSIS — Z0000 Encounter for general adult medical examination without abnormal findings: Secondary | ICD-10-CM

## 2015-05-01 LAB — CBC
HCT: 35 % — ABNORMAL LOW (ref 36.0–46.0)
Hemoglobin: 10.8 g/dL — ABNORMAL LOW (ref 12.0–15.0)
MCH: 22.8 pg — AB (ref 26.0–34.0)
MCHC: 30.9 g/dL (ref 30.0–36.0)
MCV: 74 fL — ABNORMAL LOW (ref 78.0–100.0)
MPV: 11.4 fL (ref 8.6–12.4)
Platelets: 402 10*3/uL — ABNORMAL HIGH (ref 150–400)
RBC: 4.73 MIL/uL (ref 3.87–5.11)
RDW: 17.8 % — AB (ref 11.5–15.5)
WBC: 4.8 10*3/uL (ref 4.0–10.5)

## 2015-05-01 LAB — LIPID PANEL
CHOL/HDL RATIO: 4.1 ratio (ref <=5.0)
CHOLESTEROL: 169 mg/dL (ref 125–200)
HDL: 41 mg/dL — ABNORMAL LOW (ref >=46)
LDL Cholesterol: 112 mg/dL (ref <130)
Triglycerides: 79 mg/dL (ref <150)
VLDL: 16 mg/dL (ref <30)

## 2015-05-01 LAB — COMPLETE METABOLIC PANEL WITH GFR
ALT: 22 U/L (ref 6–29)
AST: 32 U/L (ref 10–35)
Albumin: 4.3 g/dL (ref 3.6–5.1)
Alkaline Phosphatase: 86 U/L (ref 33–115)
BUN: 9 mg/dL (ref 7–25)
CALCIUM: 9.6 mg/dL (ref 8.6–10.2)
CO2: 25 mmol/L (ref 20–31)
Chloride: 104 mmol/L (ref 98–110)
Creat: 0.77 mg/dL (ref 0.50–1.10)
GFR, Est African American: >89 mL/min (ref >=60)
GFR, Est Non African American: >89 mL/min (ref >=60)
Glucose, Bld: 83 mg/dL (ref 65–99)
Potassium: 4.2 mmol/L (ref 3.5–5.3)
Sodium: 141 mmol/L (ref 135–146)
Total Bilirubin: 0.7 mg/dL (ref 0.2–1.2)
Total Protein: 7.5 g/dL (ref 6.1–8.1)

## 2015-05-01 LAB — HEMOGLOBIN A1C
HEMOGLOBIN A1C: 5.7 % — AB (ref <5.7)
Mean Plasma Glucose: 117 mg/dL — ABNORMAL HIGH (ref <117)

## 2015-05-01 MED ORDER — LISINOPRIL-HYDROCHLOROTHIAZIDE 20-12.5 MG PO TABS
1.0000 | ORAL_TABLET | Freq: Every day | ORAL | Status: DC
Start: 2015-05-01 — End: 2016-07-09

## 2015-05-01 MED ORDER — CLONAZEPAM 0.5 MG PO TABS: 0.5000 mg | ORAL_TABLET | Freq: Every day | ORAL | Status: DC
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
## 2015-05-02 ENCOUNTER — Telehealth: Payer: Self-pay | Admitting: Family Medicine

## 2015-05-02 DIAGNOSIS — D649 Anemia, unspecified: Secondary | ICD-10-CM

## 2015-05-02 DIAGNOSIS — D509 Iron deficiency anemia, unspecified: Secondary | ICD-10-CM

## 2015-05-02 HISTORY — DX: Iron deficiency anemia, unspecified: D50.9

## 2015-05-02 MED ORDER — FERROUS SULFATE 325 (65 FE) MG PO TBEC: 325.0000 mg | DELAYED_RELEASE_TABLET | Freq: Two times a day (BID) | ORAL | Status: DC

## 2015-05-02 NOTE — Telephone Encounter (Signed)
Will you please let patient know that her 3 month average blood sugar remains in the prediabetic range but not high enough to need medication.  Her hemoglobin level is in the anemic range and it looks to be due to an iron deficiency.  This can cause fatigue and SOB.  I'd recommend she start a OTC 325mg  Ferrous Sulfate supplement twice a day with a meal for the next two months.  All other blood tests were normal.

## 2015-05-02 NOTE — Telephone Encounter (Signed)
Pt advised.

## 2015-05-08 ENCOUNTER — Encounter: Payer: Self-pay | Admitting: Sports Medicine

## 2015-05-08 ENCOUNTER — Ambulatory Visit (INDEPENDENT_AMBULATORY_CARE_PROVIDER_SITE_OTHER): Payer: 59 | Admitting: Sports Medicine

## 2015-05-08 VITALS — BP 111/81 | HR 74 | Wt 184.0 lb

## 2015-05-08 DIAGNOSIS — M5030 Other cervical disc degeneration, unspecified cervical region: Secondary | ICD-10-CM

## 2015-05-08 DIAGNOSIS — G5621 Lesion of ulnar nerve, right upper limb: Secondary | ICD-10-CM

## 2015-05-08 NOTE — Progress Notes (Signed)
  Subjective:    CC: Follow-up  HPI: Cubital tunnel syndrome: Temporary response to an ulnar nerve hydrodissection of the cubital tunnel, we have tried extension splinting, she does have nerve conduction confirmed disease.   Cervical degenerative disc disease: Stabbing right-sided neck pain, there are a couple of small protrusions at C5-6 and C6-7, she did respond temporarily to a cervical epidural. She is continually and pain, and unable to use NSAIDs as she does have an upcoming gastric bypass, we are very limited in terms of medicines that we can use for her pain. At this point she is agreeable to consider surgical intervention. Pain is predominantly axial without much of a radicular component.  Past medical history, Surgical history, Family history not pertinant except as noted below, Social history, Allergies, and medications have been entered into the medical record, reviewed, and no changes needed.   Review of Systems: No fevers, chills, night sweats, weight loss, chest pain, or shortness of breath.   Objective:    General: Well Developed, well nourished, and in no acute distress.  Neuro: Alert and oriented x3, extra-ocular muscles intact, sensation grossly intact.  HEENT: Normocephalic, atraumatic, pupils equal round reactive to light, neck supple, no masses, no lymphadenopathy, thyroid nonpalpable.  Skin: Warm and dry, no rashes. Cardiac: Regular rate and rhythm, no murmurs rubs or gallops, no lower extremity edema.  Respiratory: Clear to auscultation bilaterally. Not using accessory muscles, speaking in full sentences.  Impression and Recommendations:   I spent 25 minutes with this patient, greater than 50% was face-to-face time counseling regarding the above diagnoses

## 2015-05-08 NOTE — Assessment & Plan Note (Signed)
Persistent right sided neck pain, she does have a couple of small C5-6 and C6-7 protrusions. Temporary response to initial cervical epidural, ordering the repeat cervical epidural. I'm also going to have her touch base with Dr. Lynann Bologna with spine surgery.

## 2015-05-08 NOTE — Assessment & Plan Note (Signed)
Nerve conduction confirmed cubital tunnel syndrome.  Temporary response to ulnar nerve hydrodissection at the cubital tunnel, unfortunately she does have a recurrence of pain, she will likely be a candidate for ulnar nerve transposition. We will put this on the back burner for now.

## 2015-05-09 ENCOUNTER — Ambulatory Visit (INDEPENDENT_AMBULATORY_CARE_PROVIDER_SITE_OTHER): Payer: 59

## 2015-05-09 DIAGNOSIS — Z0000 Encounter for general adult medical examination without abnormal findings: Secondary | ICD-10-CM

## 2015-05-09 DIAGNOSIS — Z1231 Encounter for screening mammogram for malignant neoplasm of breast: Secondary | ICD-10-CM

## 2015-05-09 DIAGNOSIS — Z1239 Encounter for other screening for malignant neoplasm of breast: Secondary | ICD-10-CM

## 2015-05-14 ENCOUNTER — Ambulatory Visit
Admission: RE | Admit: 2015-05-14 | Discharge: 2015-05-14 | Disposition: A | Discharge status: Home / Self Care | Payer: 59 | Source: Ambulatory Visit | Attending: Sports Medicine | Admitting: Sports Medicine

## 2015-05-14 MED ORDER — TRIAMCINOLONE ACETONIDE 40 MG/ML IJ SUSP (RADIOLOGY)
60.0000 mg | Freq: Once | INTRAMUSCULAR | Status: AC
Start: 2015-05-14 — End: 2015-05-14
  Administered 2015-05-14: 60 mg via EPIDURAL

## 2015-05-14 MED ORDER — IOHEXOL 300 MG/ML  SOLN
1.0000 mL | Freq: Once | INTRAMUSCULAR | Status: AC | PRN
  Administered 2015-05-14: 1 mL via EPIDURAL

## 2015-05-16 ENCOUNTER — Encounter: Payer: Self-pay | Admitting: Sports Medicine

## 2015-05-31 ENCOUNTER — Other Ambulatory Visit: Payer: Self-pay | Admitting: Orthopedic Surgery

## 2015-06-01 ENCOUNTER — Encounter (HOSPITAL_BASED_OUTPATIENT_CLINIC_OR_DEPARTMENT_OTHER): Payer: Self-pay | Admitting: *Deleted

## 2015-06-01 NOTE — Progress Notes (Signed)
Bring all medications. Coming next Thursday for BMET and EKG,

## 2015-06-05 NOTE — H&P (Signed)
Stacy Woods is an 49 y.o. female.   CC / Reason for Visit: Right arm problem HPI: This patient is a 49 year old, right-hand-dominant, Surveyor, quantity at [employer] who is seeing [place] today for evaluation and treatment options of right-sided ulnar neuropathy.  The patient has been treated by her primary care physician for this problem including an ultrasound guided injection into the elbow, as well as the cervical spine, MRI, physical therapy, and a history of nerve conduction studies\EMG.  She was then sent to Dr. Lynann Bologna who indicated that that she does not have cervical spine issues requiring surgical intervention and referred her to [place] for further evaluation of numbness and tingling into her small and ulnar aspect of her ring finger.   Past Medical History  Diagnosis Date  . Hypertension   . Fibromyalgia   . Anemia   . PONV (postoperative nausea and vomiting)     Past Surgical History  Procedure Laterality Date  . Tonsillectomy  1977  . Appendectomy    . Cholecystectomy    . Abdominal hysterectomy  1997  . Cesarean section  [id number]  . Knee arthroscopy  2002  . Gastric bypass  2011    Family History  Problem Relation Age of Onset  . Diabetes Father   . Hypertension Father   . Stroke Father   . Heart failure Father    Social History:  reports that she has never smoked. She does not have any smokeless tobacco history on file. She reports that she does not drink alcohol or use illicit drugs.  Allergies:  Allergies  Allergen Reactions  . Mobic [Meloxicam] Swelling  . Shellfish Allergy Nausea Only and Swelling  . Lyrica [Pregabalin] Other (See Comments)    "zombie"    No prescriptions prior to admission    No results found for this or any previous visit (from the past 57 hour(s)). No results found.  Review of Systems  All other systems reviewed and are negative.   Height 5\' 4"  (1.626 m), weight 79.833 kg (176 lb). Physical Exam  Constitutional:  WD, WN,  NAD HEENT:  NCAT, EOMI Neuro/Psych:  Alert & oriented to person, place, and time; appropriate mood & affect Lymphatic: No generalized UE edema or lymphadenopathy Extremities / MSK:  Both UE are normal with respect to appearance, ranges of motion, joint stability, muscle strength/tone, sensation, & perfusion except as otherwise noted:  There is no swelling, discoloration, or deformity about the right elbow were right hand.  There is no muscular atrophy noted.  There is full digital range of motion, full wrist range of motion, and full elbow range of motion.  There is altered sensibility to light touch over the ulnar aspect of the ring finger and the small finger.  There is also some altered sensibility extending into the ulnar aspect of the forearm.  Flexed elbow test was positive.  There is a positive Tinel's and hypersensitivity to light touch at the elbow over the ulnar nerve.  Grip strength in position 2 right 25 left 50.  Small finger and ring finger monofilament testing demonstrated 3.61 on the right and all other digits on the left and the right including the radial aspect of the ring were 2.83.  There is good strength of the interosseous muscles and all the flexor digitorum.  Labs / Xrays:  No radiographic studies obtained today.  Assessment: Right cubital tunnel syndrome  Plan:  The findings were discussed the patient.  She was provided with a release form  in order that we can obtain her nerve conduction studies.  She was also provided with a literature in regards to cubital tunnel release.  My preference would be a decompression in situ, with anterior subcutaneous transposition if the nerve proves to be unstable intraoperatively.  She has opted to schedule the surgery pending the nerve conduction study review. The details of the operative procedure were discussed with the patient.  Questions were invited and answered.  In addition to the goal of the procedure, the risks of the procedure to  include but not limited to bleeding; infection; damage to the nerves or blood vessels that could result in bleeding, numbness, weakness, chronic pain, and the need for additional procedures; stiffness; the need for revision surgery; and anesthetic risks were reviewed.  No specific outcome was guaranteed or implied.  Informed consent was obtained.   Bayley Hurn A. 06/05/2015, 9:19 AM

## 2015-06-06 ENCOUNTER — Encounter (HOSPITAL_BASED_OUTPATIENT_CLINIC_OR_DEPARTMENT_OTHER)
Admission: RE | Admit: 2015-06-06 | Discharge: 2015-06-06 | Disposition: A | Discharge status: Home / Self Care | Payer: 59 | Source: Ambulatory Visit | Attending: Orthopedic Surgery | Admitting: Orthopedic Surgery

## 2015-06-06 ENCOUNTER — Other Ambulatory Visit: Payer: Self-pay

## 2015-06-06 DIAGNOSIS — G5621 Lesion of ulnar nerve, right upper limb: Secondary | ICD-10-CM | POA: Insufficient documentation

## 2015-06-06 DIAGNOSIS — Z01818 Encounter for other preprocedural examination: Secondary | ICD-10-CM | POA: Diagnosis present

## 2015-06-06 LAB — BASIC METABOLIC PANEL
ANION GAP: 7 (ref 5–15)
BUN: 10 mg/dL (ref 6–20)
CALCIUM: 9.2 mg/dL (ref 8.9–10.3)
CO2: 25 mmol/L (ref 22–32)
Chloride: 111 mmol/L (ref 101–111)
Creatinine, Ser: 0.96 mg/dL (ref 0.44–1.00)
GFR calc Af Amer: >60 mL/min (ref >60)
Glucose, Bld: 48 mg/dL — ABNORMAL LOW (ref 65–99)
POTASSIUM: 3.8 mmol/L (ref 3.5–5.1)
Sodium: 143 mmol/L (ref 135–145)

## 2015-06-07 NOTE — Progress Notes (Signed)
After reviewing lab results of 48 glucose , call to check on pt.  She states she has hypoglycemia at times and she had nothing to eat before blood draw.  States she is fine no problems

## 2015-06-08 ENCOUNTER — Encounter: Payer: Self-pay | Admitting: Family Medicine

## 2015-06-08 ENCOUNTER — Ambulatory Visit: Payer: Self-pay | Admitting: Sports Medicine

## 2015-06-08 DIAGNOSIS — E162 Hypoglycemia, unspecified: Secondary | ICD-10-CM

## 2015-06-12 ENCOUNTER — Ambulatory Visit (HOSPITAL_BASED_OUTPATIENT_CLINIC_OR_DEPARTMENT_OTHER): Payer: 59 | Admitting: Anesthesiology

## 2015-06-12 ENCOUNTER — Encounter (HOSPITAL_BASED_OUTPATIENT_CLINIC_OR_DEPARTMENT_OTHER): Payer: Self-pay | Admitting: Anesthesiology

## 2015-06-12 ENCOUNTER — Ambulatory Visit (HOSPITAL_BASED_OUTPATIENT_CLINIC_OR_DEPARTMENT_OTHER)
Admission: RE | Admit: 2015-06-12 | Discharge: 2015-06-12 | Disposition: A | Discharge status: Home / Self Care | Payer: 59 | Source: Ambulatory Visit | Attending: Orthopedic Surgery | Admitting: Orthopedic Surgery

## 2015-06-12 ENCOUNTER — Encounter (HOSPITAL_BASED_OUTPATIENT_CLINIC_OR_DEPARTMENT_OTHER)
Admission: RE | Disposition: A | Discharge status: Home / Self Care | Payer: Self-pay | Source: Ambulatory Visit | Attending: Orthopedic Surgery

## 2015-06-12 ENCOUNTER — Telehealth: Payer: Self-pay | Admitting: Family Medicine

## 2015-06-12 DIAGNOSIS — G5621 Lesion of ulnar nerve, right upper limb: Secondary | ICD-10-CM | POA: Diagnosis not present

## 2015-06-12 DIAGNOSIS — I10 Essential (primary) hypertension: Secondary | ICD-10-CM | POA: Insufficient documentation

## 2015-06-12 DIAGNOSIS — E162 Hypoglycemia, unspecified: Secondary | ICD-10-CM | POA: Insufficient documentation

## 2015-06-12 HISTORY — PX: ULNAR NERVE TRANSPOSITION: SHX2595

## 2015-06-12 HISTORY — DX: Other specified postprocedural states: Z98.890

## 2015-06-12 HISTORY — DX: Nausea with vomiting, unspecified: R11.2

## 2015-06-12 HISTORY — DX: Anemia, unspecified: D64.9

## 2015-06-12 LAB — GLUCOSE, CAPILLARY
GLUCOSE-CAPILLARY: 80 mg/dL (ref 65–99)
GLUCOSE-CAPILLARY: 75 mg/dL (ref 65–99)

## 2015-06-12 SURGERY — ULNAR NERVE DECOMPRESSION/TRANSPOSITION
Anesthesia: General | Site: Elbow | Laterality: Right

## 2015-06-12 MED ORDER — OXYCODONE HCL 5 MG PO TABS
5.0000 mg | ORAL_TABLET | Freq: Once | ORAL | Status: AC | PRN
Start: 2015-06-12 — End: 2015-06-12
  Administered 2015-06-12: 5 mg via ORAL

## 2015-06-12 MED ORDER — OXYCODONE HCL 5 MG PO TABS
ORAL_TABLET | ORAL | Status: AC
  Filled 2015-06-12: qty 1

## 2015-06-12 MED ORDER — AMBULATORY NON FORMULARY MEDICATION: Status: DC

## 2015-06-12 MED ORDER — LACTATED RINGERS IV SOLN
INTRAVENOUS | Status: DC
  Administered 2015-06-12: 16:00:00 via INTRAVENOUS

## 2015-06-12 MED ORDER — ONDANSETRON HCL 4 MG PO TABS: 4.0000 mg | ORAL_TABLET | Freq: Three times a day (TID) | ORAL | Status: DC | PRN

## 2015-06-12 MED ORDER — PROMETHAZINE HCL 25 MG/ML IJ SOLN: 6.2500 mg | INTRAMUSCULAR | Status: DC | PRN

## 2015-06-12 MED ORDER — OXYCODONE-ACETAMINOPHEN 5-325 MG PO TABS: 1.0000 | ORAL_TABLET | Freq: Four times a day (QID) | ORAL | Status: DC | PRN

## 2015-06-12 MED ORDER — PROPOFOL 10 MG/ML IV BOLUS
INTRAVENOUS | Status: AC
  Filled 2015-06-12: qty 40

## 2015-06-12 MED ORDER — FENTANYL CITRATE (PF) 100 MCG/2ML IJ SOLN
25.0000 ug | INTRAMUSCULAR | Status: DC | PRN
  Administered 2015-06-12 (×3): 25 ug via INTRAVENOUS

## 2015-06-12 MED ORDER — DEXAMETHASONE SODIUM PHOSPHATE 10 MG/ML IJ SOLN
INTRAMUSCULAR | Status: DC | PRN
  Administered 2015-06-12: 10 mg via INTRAVENOUS

## 2015-06-12 MED ORDER — LIDOCAINE HCL (CARDIAC) 20 MG/ML IV SOLN
INTRAVENOUS | Status: DC | PRN
  Administered 2015-06-12: 60 mg via INTRAVENOUS

## 2015-06-12 MED ORDER — FENTANYL CITRATE (PF) 100 MCG/2ML IJ SOLN
INTRAMUSCULAR | Status: AC
Start: 2015-06-12 — End: 2015-06-12
  Filled 2015-06-12: qty 2

## 2015-06-12 MED ORDER — OXYCODONE HCL 5 MG/5ML PO SOLN: 5.0000 mg | Freq: Once | ORAL | Status: AC | PRN

## 2015-06-12 MED ORDER — CEFAZOLIN SODIUM-DEXTROSE 2-3 GM-% IV SOLR
INTRAVENOUS | Status: AC
  Filled 2015-06-12: qty 50

## 2015-06-12 MED ORDER — SCOPOLAMINE 1 MG/3DAYS TD PT72
MEDICATED_PATCH | TRANSDERMAL | Status: AC
  Filled 2015-06-12: qty 1

## 2015-06-12 MED ORDER — BUPIVACAINE-EPINEPHRINE 0.5% -1:200000 IJ SOLN
INTRAMUSCULAR | Status: DC | PRN
  Administered 2015-06-12: 20 mL

## 2015-06-12 MED ORDER — SCOPOLAMINE 1 MG/3DAYS TD PT72
1.0000 | MEDICATED_PATCH | Freq: Once | TRANSDERMAL | Status: DC | PRN
  Administered 2015-06-12: 1.5 mg via TRANSDERMAL

## 2015-06-12 MED ORDER — ONDANSETRON HCL 4 MG/2ML IJ SOLN
INTRAMUSCULAR | Status: AC
  Filled 2015-06-12: qty 2

## 2015-06-12 MED ORDER — CEFAZOLIN SODIUM-DEXTROSE 2-3 GM-% IV SOLR
2.0000 g | INTRAVENOUS | Status: AC
  Administered 2015-06-12: 2 g via INTRAVENOUS

## 2015-06-12 MED ORDER — PROPOFOL 10 MG/ML IV BOLUS
INTRAVENOUS | Status: DC | PRN
  Administered 2015-06-12: 200 mg via INTRAVENOUS
  Administered 2015-06-12: 20 mg via INTRAVENOUS

## 2015-06-12 MED ORDER — MIDAZOLAM HCL 2 MG/2ML IJ SOLN
1.0000 mg | INTRAMUSCULAR | Status: DC | PRN
  Administered 2015-06-12: 2 mg via INTRAVENOUS

## 2015-06-12 MED ORDER — FENTANYL CITRATE (PF) 100 MCG/2ML IJ SOLN
50.0000 ug | INTRAMUSCULAR | Status: DC | PRN
  Administered 2015-06-12: 25 ug via INTRAVENOUS
  Administered 2015-06-12: 100 ug via INTRAVENOUS

## 2015-06-12 MED ORDER — LIDOCAINE HCL (CARDIAC) 20 MG/ML IV SOLN
INTRAVENOUS | Status: AC
  Filled 2015-06-12: qty 5

## 2015-06-12 MED ORDER — MIDAZOLAM HCL 2 MG/2ML IJ SOLN
INTRAMUSCULAR | Status: AC
  Filled 2015-06-12: qty 2

## 2015-06-12 MED ORDER — ONDANSETRON HCL 4 MG/2ML IJ SOLN
INTRAMUSCULAR | Status: DC | PRN
  Administered 2015-06-12: 4 mg via INTRAVENOUS

## 2015-06-12 MED ORDER — LACTATED RINGERS IV SOLN
INTRAVENOUS | Status: DC
  Administered 2015-06-12 (×2): via INTRAVENOUS

## 2015-06-12 MED ORDER — GLYCOPYRROLATE 0.2 MG/ML IJ SOLN: 0.2000 mg | Freq: Once | INTRAMUSCULAR | Status: DC | PRN

## 2015-06-12 MED ORDER — DEXAMETHASONE SODIUM PHOSPHATE 10 MG/ML IJ SOLN
INTRAMUSCULAR | Status: AC
  Filled 2015-06-12: qty 1

## 2015-06-12 MED ORDER — FENTANYL CITRATE (PF) 100 MCG/2ML IJ SOLN
INTRAMUSCULAR | Status: AC
  Filled 2015-06-12: qty 2

## 2015-06-12 SURGICAL SUPPLY — 54 items
BENZOIN TINCTURE PRP APPL 2/3 (GAUZE/BANDAGES/DRESSINGS) IMPLANT
BLADE MINI RND TIP GREEN BEAV (BLADE) IMPLANT
BLADE SURG 15 STRL LF DISP TIS (BLADE) ×1 IMPLANT
BLADE SURG 15 STRL SS (BLADE) ×1
BNDG COHESIVE 4X5 TAN STRL (GAUZE/BANDAGES/DRESSINGS) ×2 IMPLANT
BNDG ESMARK 4X9 LF (GAUZE/BANDAGES/DRESSINGS) ×2 IMPLANT
BNDG GAUZE ELAST 4 BULKY (GAUZE/BANDAGES/DRESSINGS) ×4 IMPLANT
CHLORAPREP W/TINT 26ML (MISCELLANEOUS) ×2 IMPLANT
CORDS BIPOLAR (ELECTRODE) ×2 IMPLANT
COVER BACK TABLE 60X90IN (DRAPES) ×2 IMPLANT
COVER MAYO STAND STRL (DRAPES) ×2 IMPLANT
CUFF TOURN SGL LL 18 NRW (TOURNIQUET CUFF) ×2 IMPLANT
DRAIN PENROSE 1/2X12 LTX STRL (WOUND CARE) IMPLANT
DRAIN PENROSE 1/4X12 LTX STRL (WOUND CARE) IMPLANT
DRAPE EXTREMITY T 121X128X90 (DRAPE) ×2 IMPLANT
DRAPE SURG 17X23 STRL (DRAPES) IMPLANT
DRAPE U-SHAPE 47X51 STRL (DRAPES) IMPLANT
DRSG ADAPTIC 3X8 NADH LF (GAUZE/BANDAGES/DRESSINGS) IMPLANT
DRSG EMULSION OIL 3X3 NADH (GAUZE/BANDAGES/DRESSINGS) ×2 IMPLANT
GAUZE SPONGE 4X4 12PLY STRL (GAUZE/BANDAGES/DRESSINGS) ×2 IMPLANT
GLOVE BIO SURGEON STRL SZ7.5 (GLOVE) ×2 IMPLANT
GLOVE BIOGEL PI IND STRL 7.0 (GLOVE) ×2 IMPLANT
GLOVE BIOGEL PI IND STRL 8 (GLOVE) ×1 IMPLANT
GLOVE BIOGEL PI INDICATOR 7.0 (GLOVE) ×2
GLOVE BIOGEL PI INDICATOR 8 (GLOVE) ×1
GLOVE ECLIPSE 6.5 STRL STRAW (GLOVE) ×4 IMPLANT
GOWN STRL REUS W/ TWL LRG LVL3 (GOWN DISPOSABLE) ×2 IMPLANT
GOWN STRL REUS W/TWL LRG LVL3 (GOWN DISPOSABLE) ×2
GOWN STRL REUS W/TWL XL LVL3 (GOWN DISPOSABLE) ×2 IMPLANT
LOOP VESSEL MAXI BLUE (MISCELLANEOUS) IMPLANT
NEEDLE HYPO 25X1 1.5 SAFETY (NEEDLE) ×2 IMPLANT
NS IRRIG 1000ML POUR BTL (IV SOLUTION) ×2 IMPLANT
PACK BASIN DAY SURGERY FS (CUSTOM PROCEDURE TRAY) ×2 IMPLANT
PADDING CAST ABS 4INX4YD NS (CAST SUPPLIES) ×1
PADDING CAST ABS COTTON 4X4 ST (CAST SUPPLIES) ×1 IMPLANT
RUBBERBAND STERILE (MISCELLANEOUS) IMPLANT
SLING ARM FOAM STRAP LRG (SOFTGOODS) IMPLANT
SLING ARM MED ADULT FOAM STRAP (SOFTGOODS) IMPLANT
SLING ARM XL FOAM STRAP (SOFTGOODS) IMPLANT
STOCKINETTE 6  STRL (DRAPES) ×1
STOCKINETTE 6 STRL (DRAPES) ×1 IMPLANT
STRIP CLOSURE SKIN 1/2X4 (GAUZE/BANDAGES/DRESSINGS) IMPLANT
SUT ETHILON 8 0 BV130 4 (SUTURE) IMPLANT
SUT VIC AB 0 SH 27 (SUTURE) IMPLANT
SUT VIC AB 2-0 SH 27 (SUTURE)
SUT VIC AB 2-0 SH 27XBRD (SUTURE) IMPLANT
SUT VIC AB 3-0 SH 27 (SUTURE) ×1
SUT VIC AB 3-0 SH 27X BRD (SUTURE) ×1 IMPLANT
SUT VICRYL RAPIDE 4/0 PS 2 (SUTURE) ×2 IMPLANT
SYR BULB 3OZ (MISCELLANEOUS) ×2 IMPLANT
SYRINGE 10CC LL (SYRINGE) ×2 IMPLANT
TOWEL OR 17X24 6PK STRL BLUE (TOWEL DISPOSABLE) ×4 IMPLANT
TOWEL OR NON WOVEN STRL DISP B (DISPOSABLE) ×2 IMPLANT
UNDERPAD 30X30 (UNDERPADS AND DIAPERS) ×2 IMPLANT

## 2015-06-12 NOTE — Interval H&P Note (Signed)
History and Physical Interval Note:  06/12/2015 2:55 PM  Stacy Woods  has presented today for surgery, with the diagnosis of RIGHT CUBITAL TUNNEL SYNDROME  The various methods of treatment have been discussed with the patient and family. After consideration of risks, benefits and other options for treatment, the patient has consented to  Procedure(s): RIGHT CUBITAL TUNNEL RELEASE (Right) as a surgical intervention .  The patient's history has been reviewed, patient examined, no change in status, stable for surgery.  I have reviewed the patient's chart and labs.  Questions were answered to the patient's satisfaction.     Aloys Hupfer A.

## 2015-06-12 NOTE — Telephone Encounter (Signed)
Will you please let patient know that some individuals can have a relatively low blood sugar like she had without having any symptoms.  This isn't too alarming but it is worth checking a random blood sugar daily for one week to look for any new trends.  I'll print off a Rx for a blood sugar meter that she can take to any pharmacy.  If she can drop off one week's worth of values next week that would be appreciated.     *-*-*This message has not been handled.*-*-*   Dr. Ileene Rubens,  I went for my blood work for my surgery for Cubital Tunnel Release and when they did my blood work my blood sugar was only 48. She called be back later in the afternoon and was very concerned with this being so low. She asked be several times if I was okay. She asked me if I was checking my sugar levels on a regular basis. I explained I was not and she was concerned and stated I should reach out to my primary physician. I can usually tell when I am having a low; but in order to confirm, I currently have to use my husband's meter. I was wondering if this is something I need to do and if so, can you write a prescription for the meter and the strips. I did check with the pharmacy and in order to be covered by [facility] insurance it has to be a One Touch or Accu-Check. Please let me know your thoughts on this. Thanks, Stacy Woods

## 2015-06-12 NOTE — Discharge Instructions (Signed)
Discharge Instructions   You have a light dressing on your hand and elbow.  You may begin gentle motion of your fingers and hand immediately, but you should not do any heavy lifting or gripping.  Elevate your hand to reduce pain & swelling of the digits.  Ice over the operative site may be helpful to reduce pain & swelling.  DO NOT USE HEAT. Pain medicine has been prescribed for you.  Use your medicine as needed over the first 48 hours, and then you can begin to taper your use. You may use Tylenol in place of your prescribed pain medication, but not IN ADDITION to it. Leave the dressing in place until the 3rd day after surgery, then you may remove it. After the bandage has been removed you may shower, regularly washing the incision and letting the water run over it, but not submerging it (no swimming, soaking it in dishwater, etc.) You may drive a car when you are off of prescription pain medications and can safely control your vehicle with both hands. We will address whether therapy will be required or not when you return to the office. You may have already made your follow-up appointment when we completed your preop visit.  If not, please call our office today or the next business day to make your return appointment for 10-15 days after surgery.   Please call [phone] during normal business hours or [phone] after hours for any problems. Including the following:  - excessive redness of the incisions - drainage for more than 4 days - fever of more than 101.5 F  *Please note that pain medications will not be refilled after hours or on weekends.   Post Anesthesia Home Care Instructions  Activity: Get plenty of rest for the remainder of the day. A responsible adult should stay with you for 24 hours following the procedure.  For the next 24 hours, DO NOT: -Drive a car -Paediatric nurse -Drink alcoholic beverages -Take any medication unless instructed by your physician -Make any  legal decisions or sign important papers.  Meals: Start with liquid foods such as gelatin or soup. Progress to regular foods as tolerated. Avoid greasy, spicy, heavy foods. If nausea and/or vomiting occur, drink only clear liquids until the nausea and/or vomiting subsides. Call your physician if vomiting continues.  Special Instructions/Symptoms: Your throat may feel dry or sore from the anesthesia or the breathing tube placed in your throat during surgery. If this causes discomfort, gargle with warm salt water. The discomfort should disappear within 24 hours.  If you had a scopolamine patch placed behind your ear for the management of post- operative nausea and/or vomiting:  1. The medication in the patch is effective for 72 hours, after which it should be removed.  Wrap patch in a tissue and discard in the trash. Wash hands thoroughly with soap and water. 2. You may remove the patch earlier than 72 hours if you experience unpleasant side effects which may include dry mouth, dizziness or visual disturbances. 3. Avoid touching the patch. Wash your hands with soap and water after contact with the patch.

## 2015-06-12 NOTE — Anesthesia Preprocedure Evaluation (Signed)
Anesthesia Evaluation  Patient identified by MRN, date of birth, ID band Patient awake    Reviewed: Allergy & Precautions, NPO status , Patient's Chart, lab work & pertinent test results  Airway Mallampati: II  TM Distance: >3 FB Neck ROM: Full    Dental no notable dental hx.    Pulmonary neg pulmonary ROS,    Pulmonary exam normal breath sounds clear to auscultation       Cardiovascular hypertension, Pt. on medications Normal cardiovascular exam Rhythm:Regular Rate:Normal     Neuro/Psych negative neurological ROS  negative psych ROS   GI/Hepatic negative GI ROS, Neg liver ROS,   Endo/Other  negative endocrine ROS  Renal/GU negative Renal ROS  negative genitourinary   Musculoskeletal negative musculoskeletal ROS (+)   Abdominal   Peds negative pediatric ROS (+)  Hematology negative hematology ROS (+)   Anesthesia Other Findings   Reproductive/Obstetrics negative OB ROS                             Anesthesia Physical Anesthesia Plan  ASA: II  Anesthesia Plan: General   Post-op Pain Management:    Induction: Intravenous  Airway Management Planned: LMA  Additional Equipment:   Intra-op Plan:   Post-operative Plan: Extubation in OR  Informed Consent: I have reviewed the patients History and Physical, chart, labs and discussed the procedure including the risks, benefits and alternatives for the proposed anesthesia with the patient or authorized representative who has indicated his/her understanding and acceptance.   Dental advisory given  Plan Discussed with: CRNA and Surgeon  Anesthesia Plan Comments:         Anesthesia Quick Evaluation  

## 2015-06-12 NOTE — Transfer of Care (Signed)
Immediate Anesthesia Transfer of Care Note  Patient: Stacy Woods  Procedure(s) Performed: Procedure(s): RIGHT CUBITAL TUNNEL RELEASE (Right)  Patient Location: PACU  Anesthesia Type:General  Level of Consciousness: sedated  Airway & Oxygen Therapy: Patient Spontanous Breathing and Patient connected to face mask oxygen  Post-op Assessment: Report given to RN and Post -op Vital signs reviewed and stable  Post vital signs: Reviewed and stable  Last Vitals:  Filed Vitals:   06/12/15 1556 06/12/15 1558  BP: 87/53   Pulse:  63  Temp:    Resp:  20    Complications: No apparent anesthesia complications

## 2015-06-12 NOTE — Telephone Encounter (Signed)
Vm left to return call

## 2015-06-12 NOTE — Op Note (Signed)
06/12/2015  2:56 PM  PATIENT:  Stacy Woods  50 y.o. female  PRE-OPERATIVE DIAGNOSIS:  Right cubital tunnel syndrome  POST-OPERATIVE DIAGNOSIS:  Same  PROCEDURE:  Right ulnar neuroplasty at the elbow  SURGEON: Rayvon Char. Grandville Silos, MD  PHYSICIAN ASSISTANT: Morley Kos, OPA-C  ANESTHESIA:  general  SPECIMENS:  None  DRAINS:   None  EBL:  less than 50 mL  PREOPERATIVE INDICATIONS:  Stacy Woods is a  50 y.o. female with right ulnar neuropathy at the elbow  The risks benefits and alternatives were discussed with the patient preoperatively including but not limited to the risks of infection, bleeding, nerve injury, cardiopulmonary complications, the need for revision surgery, among others, and the patient verbalized understanding and consented to proceed.  OPERATIVE IMPLANTS: None  OPERATIVE PROCEDURE:  After receiving prophylactic antibiotics, the patient was escorted to the operative theatre and placed in a supine position.  General anesthesia was administered. A surgical "time-out" was performed during which the planned procedure, proposed operative site, and the correct patient identity were compared to the operative consent and agreement confirmed by the circulating nurse according to current facility policy.  The limb was prepped with ChloraPrep and draped in usual sterile fashion. Sterile tourniquet was applied but not inflated.  The limb was exsanguinated with an Esmarch bandage and the tourniquet inflated to approximately 115mmHg higher than systolic BP.  A curvilinear incision was marked and made over the course of the ulnar nerve on the medial aspect of the elbow. Half percent Marcaine with epinephrine was instilled in about the skin flaps to help with postoperative pain control. The skin was incised sharply with a scalpel. Subcutaneous tissues were dissected carefully to avoid any crossing cutaneous nerves. The nerve was identified just proximal to the retro-condylar groove  and release began there. There was some white substance adjacent to the nerve. It was about a centimeter in length and about the shape of a strand of spaghetti. It was removed. The release proceeded distally, lifting full-thickness the flaps of skin and subcutaneous tissues and under direct visualization with loupe assistance splitting the FCU both superficial and deep fascia. Proximally the nerve was dissected free, splitting the overlying fascia for about 6-8 cm proximal to the proximal end of the incision. The nerve thus being totally freed, the elbow was flexed cyclically and the nerve found to be stable within the groove. Nonetheless, to help prevent anterior subluxation, a small swath of fascia about the olecranon was used to re-create restraint at the level of the medial epicondyle. The nerve had capacious space about it. Some additional local anesthetic was administered. The tourniquet was released, the wound irrigated, and the skin closed in layers with 4-0 Vicryl Rapide deep dermal buried subcuticular sutures and a running 4-0 Vicryl Rapide horizontal mattress suture in the skin. A bulky dressing was applied and she was awakened and taken to the recovery room stable condition, breathing spontaneously  DISPOSITION: She'll be discharged home today with typical instructions. Initially, we will aim for return to work on Monday, but can reconsider if her overall condition is not yet fit for such. RTC 10-15 days.

## 2015-06-12 NOTE — Anesthesia Procedure Notes (Signed)
Procedure Name: LMA Insertion Date/Time: 06/12/2015 3:14 PM Performed by: Maryella Shivers Pre-anesthesia Checklist: Patient identified, Emergency Drugs available, Suction available and Patient being monitored Patient Re-evaluated:Patient Re-evaluated prior to inductionOxygen Delivery Method: Circle System Utilized Preoxygenation: Pre-oxygenation with 100% oxygen Intubation Type: IV induction Ventilation: Mask ventilation without difficulty LMA: LMA inserted LMA Size: 4.0 Number of attempts: 1 Airway Equipment and Method: Bite block Placement Confirmation: positive ETCO2 Tube secured with: Tape Dental Injury: Teeth and Oropharynx as per pre-operative assessment

## 2015-06-13 ENCOUNTER — Encounter (HOSPITAL_BASED_OUTPATIENT_CLINIC_OR_DEPARTMENT_OTHER): Payer: Self-pay | Admitting: Orthopedic Surgery

## 2015-06-13 MED FILL — ONE TOUCH ULTRA TEST STRIPS: 50 days supply | Qty: 50 | Fill #0

## 2015-06-13 MED FILL — ONE TOUCH DELICA 33G LANCET: 90 days supply | Qty: 100 | Fill #0

## 2015-06-13 MED FILL — ONE TOUCH ULTRAMINI METER: W/DEVICE | 1 days supply | Qty: 1 | Fill #0

## 2015-06-13 NOTE — Telephone Encounter (Signed)
Pt advised.

## 2015-06-16 NOTE — Anesthesia Postprocedure Evaluation (Signed)
Anesthesia Post Note  Patient: Stacy Woods  Procedure(s) Performed: Procedure(s) (LRB): RIGHT CUBITAL TUNNEL RELEASE (Right)  Patient location during evaluation: PACU Anesthesia Type: MAC Level of consciousness: awake and alert Pain management: pain level controlled Vital Signs Assessment: post-procedure vital signs reviewed and stable Respiratory status: spontaneous breathing, nonlabored ventilation, respiratory function stable and patient connected to nasal cannula oxygen Cardiovascular status: blood pressure returned to baseline and stable Postop Assessment: no signs of nausea or vomiting Anesthetic complications: no    Last Vitals:  Filed Vitals:   06/12/15 1645 06/12/15 1730  BP: 114/75 116/77  Pulse: 72 64  Temp:  36.6 C  Resp: 15 18    Last Pain:  Filed Vitals:   06/12/15 1748  PainSc: 4                  Havanna Groner S

## 2015-06-21 DIAGNOSIS — G5621 Lesion of ulnar nerve, right upper limb: Secondary | ICD-10-CM | POA: Diagnosis not present

## 2015-06-28 MED FILL — ESCITALOPRAM 10 MG TABLET: 10 | 90 days supply | Qty: 90 | Fill #1

## 2015-08-07 ENCOUNTER — Encounter: Payer: Self-pay | Admitting: Family Medicine

## 2015-08-07 ENCOUNTER — Ambulatory Visit (INDEPENDENT_AMBULATORY_CARE_PROVIDER_SITE_OTHER): Payer: 59 | Admitting: Family Medicine

## 2015-08-07 VITALS — BP 112/81 | HR 71 | Wt 177.0 lb

## 2015-08-07 DIAGNOSIS — I10 Essential (primary) hypertension: Secondary | ICD-10-CM

## 2015-08-07 DIAGNOSIS — F411 Generalized anxiety disorder: Secondary | ICD-10-CM

## 2015-08-07 DIAGNOSIS — G4700 Insomnia, unspecified: Secondary | ICD-10-CM

## 2015-08-07 DIAGNOSIS — D649 Anemia, unspecified: Secondary | ICD-10-CM

## 2015-08-07 MED ORDER — TRAZODONE HCL 100 MG PO TABS: 100.0000 mg | ORAL_TABLET | Freq: Every day | ORAL | Status: DC

## 2015-08-07 MED FILL — traZODone HCL 100 MG TABS: 100 | 30 days supply | Qty: 30 | Fill #0

## 2015-08-07 NOTE — Progress Notes (Signed)
CC: Stacy Woods is a 50 y.o. female is here for Hypertension and Anxiety   Subjective: HPI:  Follow-up anxiety: Symptoms have been worsened over the past month due to husband's upcoming cervical spine surgery. He keeps getting postponed and this prolonging the nervousness associated with this upcoming procedure. She's been trying to use clonazepam to help with sleep however is having the opposite effect and causing her to feel jittery and anxious. Nothing else seems to make symptoms better or worse it's happening on a daily basis and moderate in severity. Symptoms are tolerable during the daytime but intolerable at night.  Follow essential hypertension: Taking the lisinopril-hydrochlorothiazide without any known side effects. No chest pain shortness of breath orthopnea nor peripheral edema. No outside blood pressures report.  Follow-up anemia: She's been taking iron supplements twice a day since I saw her last. She denies any change in energy level nor bleeding or bruising abnormalities.    Review Of Systems Outlined In HPI  Past Medical History  Diagnosis Date  . Hypertension   . Fibromyalgia   . Anemia   . PONV (postoperative nausea and vomiting)     Past Surgical History  Procedure Laterality Date  . Tonsillectomy  1977  . Appendectomy    . Cholecystectomy    . Abdominal hysterectomy  1997  . Cesarean section  [id number]  . Knee arthroscopy  2002  . Gastric bypass  2011  . Ulnar nerve transposition Right 06/12/2015    Procedure: RIGHT CUBITAL TUNNEL RELEASE;  Surgeon: Milly Jakob, MD;  Location: [facility];  Service: Orthopedics;  Laterality: Right;   Family History  Problem Relation Age of Onset  . Diabetes Father   . Hypertension Father   . Stroke Father   . Heart failure Father     Social History   Social History  . Marital Status: Married    Spouse Name: N/A  . Number of Children: N/A  . Years of Education: N/A   Occupational History  . Not on  file.   Social History Main Topics  . Smoking status: Never Smoker   . Smokeless tobacco: Not on file  . Alcohol Use: No  . Drug Use: No  . Sexual Activity: No   Other Topics Concern  . Not on file   Social History Narrative     Objective: BP 112/81 mmHg  Pulse 71  Wt 177 lb (80.287 kg)  Vital signs reviewed. General: Alert and Oriented, No Acute Distress HEENT: Pupils equal, round, reactive to light. Conjunctivae clear.  External ears unremarkable.  Moist mucous membranes. Lungs: Clear and comfortable work of breathing, speaking in full sentences without accessory muscle use. Cardiac: Regular rate and rhythm.  Neuro: CN II-XII grossly intact, gait normal. Extremities: No peripheral edema.  Strong peripheral pulses.  Mental Status: No depression, anxiety, nor agitation. Logical though process. Skin: Warm and dry. Assessment & Plan: Stacy Woods was seen today for hypertension and anxiety.  Diagnoses and all orders for this visit:  Anxiety state  Essential hypertension, benign  Insomnia -     traZODone (DESYREL) 100 MG tablet; Take 1 tablet (100 mg total) by mouth at bedtime.  Anemia, unspecified anemia type   Anxiety with insomnia: Uncontrolled, Ambien caused vivid uncomfortable dreams. Starting trazodone to replace clonazepam. Essential hypertension: Controlled continue lisinopril-Hydrocort thiazide Anemia: Improving, hemoglobin was 11.5 today, continue iron supplementation for at least another 3 months.   Return in about 6 months (around 02/04/2016) for BP and Mood.

## 2015-09-19 DIAGNOSIS — G5621 Lesion of ulnar nerve, right upper limb: Secondary | ICD-10-CM | POA: Diagnosis not present

## 2015-10-01 ENCOUNTER — Ambulatory Visit (INDEPENDENT_AMBULATORY_CARE_PROVIDER_SITE_OTHER): Payer: 59 | Admitting: Family Medicine

## 2015-10-01 ENCOUNTER — Ambulatory Visit (INDEPENDENT_AMBULATORY_CARE_PROVIDER_SITE_OTHER): Payer: 59

## 2015-10-01 ENCOUNTER — Encounter: Payer: Self-pay | Admitting: Family Medicine

## 2015-10-01 VITALS — BP 122/84 | HR 65 | Wt 184.0 lb

## 2015-10-01 DIAGNOSIS — M25562 Pain in left knee: Secondary | ICD-10-CM

## 2015-10-01 DIAGNOSIS — M170 Bilateral primary osteoarthritis of knee: Secondary | ICD-10-CM | POA: Insufficient documentation

## 2015-10-01 DIAGNOSIS — M179 Osteoarthritis of knee, unspecified: Secondary | ICD-10-CM | POA: Diagnosis not present

## 2015-10-01 DIAGNOSIS — M25561 Pain in right knee: Secondary | ICD-10-CM

## 2015-10-01 MED ORDER — DICLOFENAC SODIUM 2 % TD SOLN: 2.0000 | Freq: Two times a day (BID) | TRANSDERMAL | Status: DC

## 2015-10-01 NOTE — Patient Instructions (Signed)
Thank you for coming in today. Get xray today. Attend PT.  Use pennsaid.  Return in 2-4 weeks as needed.   Prepatellar Bursitis With Rehab  Bursitis is a condition that is characterized by inflammation of a bursa. Stacy Woods exists in many areas of the body. They are fluid-filled sacs that lie between a soft tissue (skin, tendon, or ligament) and a bone, and they reduce friction between the structures as well as the stress placed on the soft tissue. Prepatellar bursitis is inflammation of the bursa that lies between the skin and the kneecap (patella). This condition often causes pain over the patella. SYMPTOMS   Pain, tenderness, and/or inflammation over the patella.  Pain that worsens with movement of the knee joint.  Decreased range of motion for the knee joint.  A crackling sound (crepitation) when the bursa is moved or touched.  Occasionally, painless swelling of the bursa.  Fever (when infected). CAUSES  Bursitis is caused by damage to the bursa, which results in an inflammatory response. Common mechanisms of injury include:  Direct trauma to the front of the knee.  Repetitive and/or stressful use of the knee. RISK INCREASES WITH:  Activities in which kneeling and/or falling on one's knees is likely (volleyball or football).  Repetitive and stressful training, especially if it involves running on hills.  Improper training techniques, such as a sudden increase in the intensity, frequency, or duration of training.  Failure to warm up properly before activity.  Poor technique.  Artificial turf. PREVENTION   Avoid kneeling or falling on your knees.  Warm up and stretch properly before activity.  Allow for adequate recovery between workouts.  Maintain physical fitness:  Strength, flexibility, and endurance.  Cardiovascular fitness.  Learn and use proper technique. When possible, have a coach correct improper technique.  Wear properly fitted and padded protective  equipment (knee pads). PROGNOSIS  If treated properly, then the symptoms of prepatellar bursitis usually resolve within 2 weeks. RELATED COMPLICATIONS   Recurrent symptoms that result in a chronic problem.  Prolonged healing time, if improperly treated or reinjured.  Limited range of motion.  Infection of bursa.  Chronic inflammation or scarring of bursa. TREATMENT  Treatment initially involves the use of ice and medication to help reduce pain and inflammation. The use of strengthening and stretching exercises may help reduce pain with activity, especially those of the quadriceps and hamstring muscles. These exercises may be performed at home or with referral to a therapist. Your caregiver may recommend knee pads when you return to playing sports, in order to reduce the stress on the prepatellar bursa. If symptoms persist despite treatment, then your caregiver may drain fluid out with a needle (aspirate) the bursa. If symptoms persist for greater than 6 months despite nonsurgical (conservative) treatment, then surgery may be recommended to remove the bursa.  MEDICATION  If pain medication is necessary, then nonsteroidal anti-inflammatory medications, such as aspirin and ibuprofen, or other minor pain relievers, such as acetaminophen, are often recommended.  Do not take pain medication for 7 days before surgery.  Prescription pain relievers may be given if deemed necessary by your caregiver. Use only as directed and only as much as you need.  Corticosteroid injections may be given by your caregiver. These injections should be reserved for the most serious cases, because they may only be given a certain number of times. HEAT AND COLD  Cold treatment (icing) relieves pain and reduces inflammation. Cold treatment should be applied for 10 to 15 minutes every  2 to 3 hours for inflammation and pain and immediately after any activity that aggravates your symptoms. Use ice packs or massage the area  with a piece of ice (ice massage).  Heat treatment may be used prior to performing the stretching and strengthening activities prescribed by your caregiver, physical therapist, or athletic trainer. Use a heat pack or soak the injury in warm water. SEEK MEDICAL CARE IF:  Treatment seems to offer no benefit, or the condition worsens.  Any medications produce adverse side effects. EXERCISES RANGE OF MOTION (ROM) AND STRETCHING EXERCISES - Prepatellar Bursitis These exercises may help you when beginning to rehabilitate your injury. Your symptoms may resolve with or without further involvement from your physician, physical therapist or athletic trainer. While completing these exercises, remember:   Restoring tissue flexibility helps normal motion to return to the joints. This allows healthier, less painful movement and activity.  An effective stretch should be held for at least 30 seconds.  A stretch should never be painful. You should only feel a gentle lengthening or release in the stretched tissue. STRETCH - Hamstrings, Standing  Stand or sit and extend your right / left leg, placing your foot on a chair or foot stool  Keeping a slight arch in your low back and your hips straight forward.  Lead with your chest and lean forward at the waist until you feel a gentle stretch in the back of your right / left knee or thigh. (When done correctly, this exercise requires leaning only a small distance.)  Hold this position for __________ seconds. Repeat __________ times. Complete this stretch __________ times per day. STRETCH - Quadriceps, Prone   Lie on your stomach on a firm surface, such as a bed or padded floor.  Bend your right / left knee and grasp your ankle. If you are unable to reach, your ankle or pant leg, use a belt around your foot to lengthen your reach.  Gently pull your heel toward your buttocks. Your knee should not slide out to the side. You should feel a stretch in the front  of your thigh and/or knee.  Hold this position for __________ seconds. Repeat __________ times. Complete this stretch __________ times per day.  STRETCH - Hamstrings/Adductors, V-Sit   Sit on the floor with your legs extended in a large "V," keeping your knees straight.  With your head and chest upright, bend at your waist reaching for your right foot to stretch your left adductors.  You should feel a stretch in your left inner thigh. Hold for __________ seconds.  Return to the upright position to relax your leg muscles.  Continuing to keep your chest upright, bend straight forward at your waist to stretch your hamstrings.  You should feel a stretch behind both of your thighs and/or knees. Hold for __________ seconds.  Return to the upright position to relax your leg muscles.  Repeat steps 2 through 4. Repeat __________ times. Complete this exercise __________ times per day.  STRENGTHENING EXERCISES - Prepatellar Bursitis  These exercises may help you when beginning to rehabilitate your injury. They may resolve your symptoms with or without further involvement from your physician, physical therapist or athletic trainer. While completing these exercises, remember:  Muscles can gain both the endurance and the strength needed for everyday activities through controlled exercises.  Complete these exercises as instructed by your physician, physical therapist or athletic trainer. Progress the resistance and repetitions only as guided. STRENGTH - Quadriceps, Isometrics  Lie on your back  with your right / left leg extended and your opposite knee bent.  Gradually tense the muscles in the front of your right / left thigh. You should see either your kneecap slide up toward your hip or increased dimpling just above the knee. This motion will push the back of the knee down toward the floor/mat/bed on which you are lying.  Hold the muscle as tight as you can without increasing your pain for  __________ seconds.  Relax the muscles slowly and completely in between each repetition. Repeat __________ times. Complete this exercise __________ times per day.  STRENGTH - Quadriceps, Short Arcs   Lie on your back. Place a __________ inch towel roll under your knee so that the knee slightly bends.  Raise only your lower leg by tightening the muscles in the front of your thigh. Do not allow your thigh to rise.  Hold this position for __________ seconds. Repeat __________ times. Complete this exercise __________ times per day.  OPTIONAL ANKLE WEIGHTS: Begin with ____________________, but DO NOT exceed ____________________. Increase in1 lb/0.5 kg increments.  STRENGTH - Quadriceps, Straight Leg Raises  Quality counts! Watch for signs that the quadriceps muscle is working to insure you are strengthening the correct muscles and not "cheating" by substituting with healthier muscles.  Lay on your back with your right / left leg extended and your opposite knee bent.  Tense the muscles in the front of your right / left thigh. You should see either your kneecap slide up or increased dimpling just above the knee. Your thigh may even quiver.  Tighten these muscles even more and raise your leg 4 to 6 inches off the floor. Hold for __________ seconds.  Keeping these muscles tense, lower your leg.  Relax the muscles slowly and completely in between each repetition. Repeat __________ times. Complete this exercise __________ times per day.  STRENGTH - Quadriceps, Step-Ups   Use a thick book, step or step stool that is __________ inches tall.  Holding a wall or counter for balance only, not support.  Slowly step-up with your right / left foot, keeping your knee in line with your hip and foot. Do not allow your knee to bend so far that you cannot see your toes.  Slowly unlock your knee and lower yourself to the starting position. Your muscles, not gravity, should lower you. Repeat __________  times. Complete this exercise __________ times per day.   This information is not intended to replace advice given to you by your health care provider. Make sure you discuss any questions you have with your health care provider.   Document Released: 05/26/2005 Document Revised: 02/14/2015 Document Reviewed: 09/07/2008 Elsevier Interactive Patient Education [employer].

## 2015-10-01 NOTE — Progress Notes (Signed)
   Subjective:    I'm seeing this patient as a consultation for:  Dr. Ileene Rubens  CC: Left knee pain  HPI: Patient has a two-week history of left anterior knee pain dramatically worsening 5 days ago. She denies any injury locking or catching. She does not popping and crunching with knee extension is been present for years. She's tried ibuprofen and naproxen which have not helped much. She denies significant swelling.  Past medical history, Surgical history, Family history not pertinant except as noted below, Social history, Allergies, and medications have been entered into the medical record, reviewed, and no changes needed.   Review of Systems: No headache, visual changes, nausea, vomiting, diarrhea, constipation, dizziness, abdominal pain, skin rash, fevers, chills, night sweats, weight loss, swollen lymph nodes, body aches, joint swelling, muscle aches, chest pain, shortness of breath, mood changes, visual or auditory hallucinations.   Objective:    Filed Vitals:   10/01/15 1312  BP: 122/84  Pulse: 65   General: Well Developed, well nourished, and in no acute distress.  Neuro/Psych: Alert and oriented x3, extra-ocular muscles intact, able to move all 4 extremities, sensation grossly intact. Skin: Warm and dry, no rashes noted.  Respiratory: Not using accessory muscles, speaking in full sentences, trachea midline.  Cardiovascular: Pulses palpable, no extremity edema. Abdomen: Does not appear distended. MSK: Left knee is normal-appearing without effusion or erythema. Range of motion 0-120 with 2+ retropatellar crepitations. Tender palpation overlying the patella tendon. Palpable squeak with rub of the patellar tendon area. Nontender otherwise. Stable ligamentous exam.  Negative McMurray's testing bilaterally. Intact extension strength however pain with resisted knee extension is present. Normal gait.  X-ray knees bilaterally pending  Limited musculoskeletal ultrasound of the left  knee. Patella tendon is intact and normal appearing. The patella and tibial insertion site are also normal appearing. There is hypoechoic fluid collection superficial to the patella tendon consistent with moderate prepatellar bursitis. No increased Doppler activity. Impression consistent with prepatellar bursitis.  No results found for this or any previous visit (from the past 24 hour(s)). No results found.  Impression and Recommendations:   50 year old woman with left anterior knee pain probable prepatellar bursitis versus patellofemoral chondromalacia. Mild versus thickening seen on ultrasound. Treatment with topical Pennsaid as well as physical therapy to include iontophoresis. X-rays pending. Recheck in 2 weeks or so.  This case required medical decision making of moderate complexity.

## 2015-10-02 NOTE — Progress Notes (Signed)
Quick Note:  Mild arthritis is present in the knees ______

## 2015-10-03 ENCOUNTER — Encounter: Payer: Self-pay | Admitting: Physical Therapy

## 2015-10-03 ENCOUNTER — Ambulatory Visit: Payer: 59 | Attending: Family Medicine | Admitting: Physical Therapy

## 2015-10-03 DIAGNOSIS — M25562 Pain in left knee: Secondary | ICD-10-CM | POA: Diagnosis not present

## 2015-10-03 NOTE — Therapy (Signed)
[address] [place], [place], [address] Phone: [phone]   Fax:  [phone]  Physical Therapy Evaluation  Patient Details  Name: Stacy Woods MRN: [id number] Date of Birth: 12/20/1965 Referring Provider: Lynne Leader  Encounter Date: 10/03/2015      PT End of Session - 10/03/15 1512    Visit Number 1   Date for PT Re-Evaluation 12/03/15   PT Start Time (p) 1425   PT Stop Time (p) 1513   PT Time Calculation (min) (p) 48 min   Activity Tolerance (p) Patient tolerated treatment well   Behavior During Therapy (p) WFL for tasks assessed/performed      Past Medical History  Diagnosis Date  . Hypertension   . Fibromyalgia   . Anemia   . PONV (postoperative nausea and vomiting)     Past Surgical History  Procedure Laterality Date  . Tonsillectomy  1977  . Appendectomy    . Cholecystectomy    . Abdominal hysterectomy  1997  . Cesarean section  [id number]  . Knee arthroscopy  2002  . Gastric bypass  2011  . Ulnar nerve transposition Right 06/12/2015    Procedure: RIGHT CUBITAL TUNNEL RELEASE;  Surgeon: Milly Jakob, MD;  Location: [facility];  Service: Orthopedics;  Laterality: Right;    There were no vitals filed for this visit.       Subjective Assessment - 10/03/15 1429    Subjective Patient reports some left knee pain about 3 weeks ago, no known reason.  She reports that she had a left knee scope in 2002.  MD performed a musculoskeletal [place] and diagnosed her with bursitis   Patient Stated Goals less pain, ride hore and motorcycle with less pain   Currently in Pain? Yes   Pain Score 3    Pain Location Knee   Pain Orientation Left;Anterior   Pain Descriptors / Indicators Aching   Pain Type Chronic pain   Pain Onset 1 to 4 weeks ago   Pain Frequency Constant   Aggravating Factors  by end of day, on motorcycle and with riding hores, worse at night the past 3 weeks up to 9/10   Pain Relieving Factors the PENSAID   Effect of Pain on Daily Activities difficulty sleeping            [facility] PT Assessment - 10/03/15 0001    Assessment   Medical Diagnosis left knee pain   Referring Provider Lynne Leader   Onset Date/Surgical Date 09/19/15   Prior Therapy no   Precautions   Precautions None   Balance Screen   Has the patient fallen in the past 6 months No   Has the patient had a decrease in activity level because of a fear of falling?  No   Is the patient reluctant to leave their home because of a fear of falling?  No   Home Environment   Additional Comments has 3 steps does not do step over step   Prior Function   Level of Independence Independent   Vocation Full time employment   Vocation Requirements sitting   Leisure rides motorcycle and horses   ROM / Strength   AROM / PROM / Strength AROM;Strength   AROM   AROM Assessment Site Knee   Right/Left Knee Left   Left Knee Extension 0   Left Knee Flexion 117   Flexibility   Soft Tissue Assessment /Muscle Length --  some tightness of  th eITB   Palpation   Palpation comment very tender patellar tendon area and inferior patella, lateral tracking patella   Ambulation/Gait   Gait Comments mild antalgic gait on the left                   [facility] Adult PT Treatment/Exercise - 10/03/15 0001    Modalities   Modalities Iontophoresis   Iontophoresis   Type of Iontophoresis Dexamethasone   Location left patella   Dose 78mA   Time 4 hour patch                  PT Short Term Goals - 10/03/15 1647    PT SHORT TERM GOAL #1   Title independent with initial HEP   Time 2   Period Weeks   Status New           PT Long Term Goals - 10/03/15 1648    PT LONG TERM GOAL #1   Title understand RICE   Time 8   Period Weeks   Status New   PT LONG TERM GOAL #2   Title increase ROM to WFL's   Time 8   Period Weeks   Status New   PT LONG TERM GOAL #3   Title decrease pain 50%   Time 8    Period Weeks   Status New   PT LONG TERM GOAL #4   Title ride motorcycle without difficulty   Time 8   Period Weeks   Status New               Plan - 10/03/15 1606    Clinical Impression Statement Patient with left knee pain, MD diagnosis is prepatellar bursitis, she reports significant pain for the past 3 weeks and difficulty sleeping, she reports that MD gave her medicine and she is feeling a little better.  Has lateral tracking patella, very tender patella tendon.   Rehab Potential Good   PT Frequency 2x / week   PT Duration 8 weeks   PT Treatment/Interventions ADLs/Self Care Home Management;Cryotherapy;Electrical Stimulation;Iontophoresis 4mg /ml Dexamethasone;Ultrasound;Therapeutic activities;Therapeutic exercise;Manual techniques;Taping;Patient/family education;Passive range of motion   PT Next Visit Plan May try taping, may continue ionto, she has good exercises from MD, just reinforced the VMO and TKE   Consulted and Agree with Plan of Care Patient      Patient will benefit from skilled therapeutic intervention in order to improve the following deficits and impairments:  Abnormal gait, Decreased range of motion, Decreased strength, Difficulty walking, Pain  Visit Diagnosis: Pain in left knee - Plan: PT plan of care cert/re-cert     Problem List Patient Active Problem List   Diagnosis Date Noted  . Left knee pain 10/01/2015  . Hypoglycemia 06/12/2015  . Anemia 05/02/2015  . Right leg swelling 01/16/2015  . Cubital tunnel syndrome on right 09/14/2014  . Degenerative disc disease, cervical 07/27/2014  . Prediabetes 09/01/2013  . Fibromyalgia 04/11/2013  . Essential hypertension, benign 04/11/2013  . Anxiety state 04/11/2013  . Sebaceous cyst 04/11/2013  . Pyelonephritis 03/14/2013    Sumner Boast., PT 10/03/2015, 4:50 PM  [address] [place], [place], [address] Phone:  [phone]   Fax:  [phone]  Name: Stacy Woods MRN: [id number] Date of Birth: 07-04-1965

## 2015-10-25 MED FILL — ONE TOUCH ULTRA TEST STRIPS: 50 days supply | Qty: 50 | Fill #1

## 2015-11-12 ENCOUNTER — Encounter: Payer: Self-pay | Admitting: Family Medicine

## 2015-11-13 ENCOUNTER — Encounter: Payer: Self-pay | Admitting: Family Medicine

## 2015-11-13 ENCOUNTER — Ambulatory Visit (INDEPENDENT_AMBULATORY_CARE_PROVIDER_SITE_OTHER): Payer: 59 | Admitting: Family Medicine

## 2015-11-13 VITALS — BP 134/86 | HR 77 | Wt 184.0 lb

## 2015-11-13 DIAGNOSIS — S338XXA Sprain of other parts of lumbar spine and pelvis, initial encounter: Secondary | ICD-10-CM | POA: Diagnosis not present

## 2015-11-13 DIAGNOSIS — S39012A Strain of muscle, fascia and tendon of lower back, initial encounter: Secondary | ICD-10-CM

## 2015-11-13 NOTE — Patient Instructions (Signed)
Thank you for coming in today. Come back or go to the emergency room if you notice new weakness new numbness problems walking or bowel or bladder problems. Attend PT.  I recommend Dry Needling,  Return in 2 weeks for back follow up.   TENS UNIT: This is helpful for muscle pain and spasm.   Search and Purchase a TENS 7000 2nd edition at www.tenspros.com. It should be less than $30.     TENS unit instructions: Do not shower or bathe with the unit on Turn the unit off before removing electrodes or batteries If the electrodes lose stickiness add a drop of water to the electrodes after they are disconnected from the unit and place on plastic sheet. If you continued to have difficulty, call the TENS unit company to purchase more electrodes. Do not apply lotion on the skin area prior to use. Make sure the skin is clean and dry as this will help prolong the life of the electrodes. After use, always check skin for unusual red areas, rash or other skin difficulties. If there are any skin problems, does not apply electrodes to the same area. Never remove the electrodes from the unit by pulling the wires. Do not use the TENS unit or electrodes other than as directed. Do not change electrode placement without consultating your therapist or physician. Keep 2 fingers with between each electrode. Wear time ratio is 2:1, on to off times.    For example on for 30 minutes off for 15 minutes and then on for 30 minutes off for 15 minutes    Lumbosacral Strain Lumbosacral strain is a strain of any of the parts that make up your lumbosacral vertebrae. Your lumbosacral vertebrae are the bones that make up the lower third of your backbone. Your lumbosacral vertebrae are held together by muscles and tough, fibrous tissue (ligaments).  CAUSES  A sudden blow to your back can cause lumbosacral strain. Also, anything that causes an excessive stretch of the muscles in the low back can cause this strain. This is  typically seen when people exert themselves strenuously, fall, lift heavy objects, bend, or crouch repeatedly. RISK FACTORS  Physically demanding work.  Participation in pushing or pulling sports or sports that require a sudden twist of the back (tennis, golf, baseball).  Weight lifting.  Excessive lower back curvature.  Forward-tilted pelvis.  Weak back or abdominal muscles or both.  Tight hamstrings. SIGNS AND SYMPTOMS  Lumbosacral strain may cause pain in the area of your injury or pain that moves (radiates) down your leg.  DIAGNOSIS Your health care provider can often diagnose lumbosacral strain through a physical exam. In some cases, you may need tests such as X-ray exams.  TREATMENT  Treatment for your lower back injury depends on many factors that your clinician will have to evaluate. However, most treatment will include the use of anti-inflammatory medicines. HOME CARE INSTRUCTIONS   Avoid hard physical activities (tennis, racquetball, waterskiing) if you are not in proper physical condition for it. This may aggravate or create problems.  If you have a back problem, avoid sports requiring sudden body movements. Swimming and walking are generally safer activities.  Maintain good posture.  Maintain a healthy weight.  For acute conditions, you may put ice on the injured area.  Put ice in a plastic bag.  Place a towel between your skin and the bag.  Leave the ice on for 20 minutes, 2-3 times a day.  When the low back starts healing, stretching  and strengthening exercises may be recommended. SEEK MEDICAL CARE IF:  Your back pain is getting worse.  You experience severe back pain not relieved with medicines. SEEK IMMEDIATE MEDICAL CARE IF:   You have numbness, tingling, weakness, or problems with the use of your arms or legs.  There is a change in bowel or bladder control.  You have increasing pain in any area of the body, including your belly (abdomen).  You  notice shortness of breath, dizziness, or feel faint.  You feel sick to your stomach (nauseous), are throwing up (vomiting), or become sweaty.  You notice discoloration of your toes or legs, or your feet get very cold. MAKE SURE YOU:   Understand these instructions.  Will watch your condition.  Will get help right away if you are not doing well or get worse.   This information is not intended to replace advice given to you by your health care provider. Make sure you discuss any questions you have with your health care provider.   Document Released: 03/05/2005 Document Revised: 06/16/2014 Document Reviewed: 01/12/2013 Elsevier Interactive Patient Education [employer].

## 2015-11-13 NOTE — Progress Notes (Signed)
Stacy Woods is a 50 y.o. female who presents to [facility] today for left-sided low back pain. Patient has a two-week history of severe left-sided low back pain. She denies any radiating pain weakness or numbness bowel bladder dysfunction or difficulty walking. She denies any injury. She notes her husband recently has had surgery and she's been helping her husband move more than usual. She notes extension and rotation and walking tended to exacerbate her pain. She's tried ibuprofen which has helped a bit.   Past Medical History  Diagnosis Date  . Hypertension   . Fibromyalgia   . Anemia   . PONV (postoperative nausea and vomiting)    Past Surgical History  Procedure Laterality Date  . Tonsillectomy  1977  . Appendectomy    . Cholecystectomy    . Abdominal hysterectomy  1997  . Cesarean section  [id number]  . Knee arthroscopy  2002  . Gastric bypass  2011  . Ulnar nerve transposition Right 06/12/2015    Procedure: RIGHT CUBITAL TUNNEL RELEASE;  Surgeon: Milly Jakob, MD;  Location: [facility];  Service: Orthopedics;  Laterality: Right;   Social History  Substance Use Topics  . Smoking status: Never Smoker   . Smokeless tobacco: Not on file  . Alcohol Use: No   family history includes Diabetes in her father; Heart failure in her father; Hypertension in her father; Stroke in her father.  ROS:  No headache, visual changes, nausea, vomiting, diarrhea, constipation, dizziness, abdominal pain, skin rash, fevers, chills, night sweats, weight loss, swollen lymph nodes, body aches, joint swelling, muscle aches, chest pain, shortness of breath, mood changes, visual or auditory hallucinations.    Medications: Current Outpatient Prescriptions  Medication Sig Dispense Refill  . AMBULATORY NON FORMULARY MEDICATION One Touch Glucometer per insurance formulary. One month of testing strips, and lancets used to check blood sugar  daily. 1 Units prn  . Diclofenac Sodium 2 % SOLN Place 2 sprays onto the skin 2 (two) times daily. 1 Bottle 11  . escitalopram (LEXAPRO) 10 MG tablet Take 1 tablet (10 mg total) by mouth daily. 90 tablet 3  . ferrous sulfate 325 (65 FE) MG EC tablet Take 1 tablet (325 mg total) by mouth 2 (two) times daily with a meal. (Patient taking differently: Take 325 mg by mouth 4 (four) times daily. )  3  . lisinopril-hydrochlorothiazide (PRINZIDE,ZESTORETIC) 20-12.5 MG tablet Take 1 tablet by mouth daily. 90 tablet 2  . traZODone (DESYREL) 100 MG tablet Take 1 tablet (100 mg total) by mouth at bedtime. 30 tablet 5   No current facility-administered medications for this visit.   Allergies  Allergen Reactions  . Mobic [Meloxicam] Swelling  . Shellfish Allergy Nausea Only and Swelling  . Percocet [Oxycodone-Acetaminophen] Itching  . Lyrica [Pregabalin] Other (See Comments)    "zombie"     Exam:  BP 134/86 mmHg  Pulse 77  Wt 184 lb (83.462 kg) General: Well Developed, well nourished, and in no acute distress.  Neuro/Psych: Alert and oriented x3, extra-ocular muscles intact, able to move all 4 extremities, sensation grossly intact. Skin: Warm and dry, no rashes noted.  Respiratory: Not using accessory muscles, speaking in full sentences, trachea midline.  Cardiovascular: Pulses palpable, no extremity edema. Abdomen: Does not appear distended. MSK: Spine: Nontender to spinal midline along the thoracic and lumbar spine. The left lumbar paraspinals and quadratus lumborum are tender to palpation. Lumbar motion is intact however patient has pain with extension  and left rotation and left lateral flexion. Negative slump test. Lower extremity strength and reflexes are equal and normal throughout. Normal gait.   No results found for this or any previous visit (from the past 24 hour(s)). No results found.   50 year old woman with lumbosacral strain involving the left lumbar paraspinals and quadratus  lumborum. Treat with physical therapy NSAIDs heating pad home exercise program. Additionally use TENS unit. Recheck in 2 weeks.

## 2015-11-16 ENCOUNTER — Encounter: Payer: Self-pay | Admitting: Rehabilitative and Restorative Service Providers"

## 2015-11-16 ENCOUNTER — Ambulatory Visit (INDEPENDENT_AMBULATORY_CARE_PROVIDER_SITE_OTHER): Payer: 59 | Admitting: Rehabilitative and Restorative Service Providers"

## 2015-11-16 DIAGNOSIS — R29898 Other symptoms and signs involving the musculoskeletal system: Secondary | ICD-10-CM

## 2015-11-16 DIAGNOSIS — M791 Myalgia, unspecified site: Secondary | ICD-10-CM

## 2015-11-16 DIAGNOSIS — R293 Abnormal posture: Secondary | ICD-10-CM | POA: Diagnosis not present

## 2015-11-16 DIAGNOSIS — M5442 Lumbago with sciatica, left side: Secondary | ICD-10-CM
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
## 2015-11-16 NOTE — Patient Instructions (Signed)
Trigger Point Dry Needling  . What is Trigger Point Dry Needling (DN)? o DN is a physical therapy technique used to treat muscle pain and dysfunction. Specifically, DN helps deactivate muscle trigger points (muscle knots).  o A thin filiform needle is used to penetrate the skin and stimulate the underlying trigger point. The goal is for a local twitch response (LTR) to occur and for the trigger point to relax. No medication of any kind is injected during the procedure.   . What Does Trigger Point Dry Needling Feel Like?  o The procedure feels different for each individual patient. Some patients report that they do not actually feel the needle enter the skin and overall the process is not painful. Very mild bleeding may occur. However, many patients feel a deep cramping in the muscle in which the needle was inserted. This is the local twitch response.   Marland Kitchen How Will I feel after the treatment? o Soreness is normal, and the onset of soreness may not occur for a few hours. Typically this soreness does not last longer than two days.  o Bruising is uncommon, however; ice can be used to decrease any possible bruising.  o In rare cases feeling tired or nauseous after the treatment is normal. In addition, your symptoms may get worse before they get better, this period will typically not last longer than 24 hours.   . What Can I do After My Treatment? o Increase your hydration by drinking more water for the next 24 hours. o You may place ice or heat on the areas treated that have become sore, however, do not use heat on inflamed or bruised areas. Heat often brings more relief post needling. o You can continue your regular activities, but vigorous activity is not recommended initially after the treatment for 24 hours. o DN is best combined with other physical therapy such as strengthening, stretching, and other therapies.    Trunk: Prone Extension (Press-Ups)    Lie on stomach on firm, flat surface.  Relax bottom and legs. Raise chest in air with elbows straight. Keep hips flat on surface, sag stomach. Hold __2-3__ seconds. Repeat __10__ times. Do __2-3__ sessions per day. CAUTION: Movement should be gentle and slow.   Abdominal Bracing With Pelvic Floor (Hook-Lying)    With neutral spine, tighten pelvic floor and abdominals sucking bellly button to back bone; tighten muscles in the low back at waist. Hold 10 sec Repeat __10_ times. Do _several__ times a day. Progress to do in sitting; standing; walking and with functional activities  Piriformis Stretch    Lying on back, pull right knee toward opposite shoulder. Hold __30__ seconds. Repeat __3__ times. Do _2-3___ sessions per day.   HIP: Hamstrings - Supine    Place strap around foot. Raise leg up, keep knee straight. Hold _30__ seconds. _3__ reps per set, __2-3_ times per day   Sleeping on Back  Place pillow under knees. A pillow with cervical support and a roll around waist are also helpful. Copyright  VHI. All rights reserved.  Sleeping on Side Place pillow between knees. Use cervical support under neck and a roll around waist as needed. Copyright  VHI. All rights reserved.   Sleeping on Stomach   If this is the only desirable sleeping position, place pillow under lower legs, and under stomach or chest as needed.  Posture - Sitting   Sit upright, head facing forward. Try using a roll to support lower back. Keep shoulders relaxed, and avoid rounded  back. Keep hips level with knees. Avoid crossing legs for long periods. Stand to Sit / Sit to Stand   To sit: Bend knees to lower self onto front edge of chair, then scoot back on seat. To stand: Reverse sequence by placing one foot forward, and scoot to front of seat. Use rocking motion to stand up.   Work Height and Reach  Ideal work height is no more than 2 to 4 inches below elbow level when standing, and at elbow level when sitting. Reaching should be limited  to arm's length, with elbows slightly bent.  Bending  Bend at hips and knees, not back. Keep feet shoulder-width apart.    Posture - Standing   Good posture is important. Avoid slouching and forward head thrust. Maintain curve in low back and align ears over shoul- ders, hips over ankles.  Alternating Positions   Alternate tasks and change positions frequently to reduce fatigue and muscle tension. Take rest breaks. Computer Work   Position work to Programmer, multimedia. Use proper work and seat height. Keep shoulders back and down, wrists straight, and elbows at right angles. Use chair that provides full back support. Add footrest and lumbar roll as needed.  Getting Into / Out of Car  Lower self onto seat, scoot back, then bring in one leg at a time. Reverse sequence to get out.  Dressing  Lie on back to pull socks or slacks over feet, or sit and bend leg while keeping back straight.    Housework - Sink  Place one foot on ledge of cabinet under sink when standing at sink for prolonged periods.   Pushing / Pulling  Pushing is preferable to pulling. Keep back in proper alignment, and use leg muscles to do the work.  Deep Squat   Squat and lift with both arms held against upper trunk. Tighten stomach muscles without holding breath. Use smooth movements to avoid jerking.  Avoid Twisting   Avoid twisting or bending back. Pivot around using foot movements, and bend at knees if needed when reaching for articles.  Carrying Luggage   Distribute weight evenly on both sides. Use a cart whenever possible. Do not twist trunk. Move body as a unit.   Lifting Principles .Maintain proper posture and head alignment. .Slide object as close as possible before lifting. .Move obstacles out of the way. .Test before lifting; ask for help if too heavy. .Tighten stomach muscles without holding breath. .Use smooth movements; do not jerk. .Use legs to do the work, and pivot with  feet. .Distribute the work load symmetrically and close to the center of trunk. .Push instead of pull whenever possible.   Ask For Help   Ask for help and delegate to others when possible. Coordinate your movements when lifting together, and maintain the low back curve.  Log Roll   Lying on back, bend left knee and place left arm across chest. Roll all in one movement to the right. Reverse to roll to the left. Always move as one unit. Housework - Sweeping  Use long-handled equipment to avoid stooping.   Housework - Wiping  Position yourself as close as possible to reach work surface. Avoid straining your back.  Laundry - Unloading Wash   To unload small items at bottom of washer, lift leg opposite to arm being used to reach.  [place] close to area to be raked. Use arm movements to do the work. Keep back straight and avoid twisting.  Cart  When reaching into cart with one arm, lift opposite leg to keep back straight.   Getting Into / Out of Bed  Lower self to lie down on one side by raising legs and lowering head at the same time. Use arms to assist moving without twisting. Bend both knees to roll onto back if desired. To sit up, start from lying on side, and use same move-ments in reverse. Housework - Vacuuming  Hold the vacuum with arm held at side. Step back and forth to move it, keeping head up. Avoid twisting.   Laundry - IT consultant so that bending and twisting can be avoided.   Laundry - Unloading Dryer  Squat down to reach into clothes dryer or use a reacher.  Gardening - Weeding / Probation officer or Kneel. Knee pads may be helpful.

## 2015-11-21 ENCOUNTER — Encounter: Payer: Self-pay | Admitting: Rehabilitative and Restorative Service Providers"

## 2015-11-21 ENCOUNTER — Ambulatory Visit (INDEPENDENT_AMBULATORY_CARE_PROVIDER_SITE_OTHER): Payer: 59 | Admitting: Rehabilitative and Restorative Service Providers"

## 2015-11-21 DIAGNOSIS — R29898 Other symptoms and signs involving the musculoskeletal system: Secondary | ICD-10-CM

## 2015-11-21 DIAGNOSIS — R293 Abnormal posture: Secondary | ICD-10-CM

## 2015-11-21 DIAGNOSIS — M791 Myalgia, unspecified site: Secondary | ICD-10-CM

## 2015-11-21 DIAGNOSIS — M5442 Lumbago with sciatica, left side: Secondary | ICD-10-CM | POA: Diagnosis not present

## 2015-11-21 NOTE — Therapy (Signed)
[place], [place], [address] Phone: [phone]   Fax:  [phone]  Physical Therapy Treatment  Patient Details  Name: Stacy Woods MRN: [id number] Date of Birth: Jan 22, 1966 Referring Provider: Dr. Lynne Leader  Encounter Date: 11/21/2015      PT End of Session - 11/21/15 1520    Visit Number 2   Number of Visits 12   Date for PT Re-Evaluation 12/28/15   PT Start Time [id number]   PT Stop Time 1611   PT Time Calculation (min) 56 min   Activity Tolerance Patient tolerated treatment well      Past Medical History  Diagnosis Date  . Hypertension   . Fibromyalgia   . Anemia   . PONV (postoperative nausea and vomiting)     Past Surgical History  Procedure Laterality Date  . Tonsillectomy  1977  . Appendectomy    . Cholecystectomy    . Abdominal hysterectomy  1997  . Cesarean section  [id number]  . Knee arthroscopy  2002  . Gastric bypass  2011  . Ulnar nerve transposition Right 06/12/2015    Procedure: RIGHT CUBITAL TUNNEL RELEASE;  Surgeon: Milly Jakob, MD;  Location: [facility];  Service: Orthopedics;  Laterality: Right;    There were no vitals filed for this visit.      Subjective Assessment - 11/21/15 1516    Subjective The TDN seemd to help at the initial visit. She was not as sore as she thought shewas going to be. She would like to try to TDN again. Working on exercises at home. Used the TENS unit Sunday for an extended period of time and that did help.  She is just coming from a baseball game and has increased pain  with increased activity today. Lt leg freels "draggy"    Currently in Pain? Yes   Pain Score 8    Pain Location Back   Pain Orientation Left;Lower                         [facility] Adult PT Treatment/Exercise - 11/21/15 0001    Therapeutic Activites    Therapeutic Activities --  myofacial ball release work    Lumbar Exercises: Stretches    Passive Hamstring Stretch 3 reps;30 seconds   Piriformis Stretch 3 reps;30 seconds   Lumbar Exercises: Supine   Ab Set --  3 part core 10 sec x 10   Bent Knee Raise 10 reps   Moist Heat Therapy   Number Minutes Moist Heat 20 Minutes   Moist Heat Location Lumbar Spine;Hip  Lt   Acupuncturist Location bilat lumbar spine L5; Lt piriformis/hip abductor    Electrical Stimulation Action IFC   Electrical Stimulation Parameters to tolerance   Electrical Stimulation Goals Pain;Tone   Manual Therapy   Manual therapy comments pt prone    Soft tissue mobilization Lt piriformis; hip abductors           Trigger Point Dry Needling - 11/21/15 1526    Consent Given? Yes   Muscles Treated Lower Body --  multifidi - Lt -twitch/ desreased tightness   Gluteus Maximus Response Twitch response elicited;Palpable increased muscle length   Gluteus Minimus Response Twitch response elicited;Palpable increased muscle length   Piriformis Response Twitch response elicited;Palpable increased muscle length                PT Short Term Goals -  10/03/15 1647    PT SHORT TERM GOAL #1   Title independent with initial HEP   Time 2   Period Weeks   Status New           PT Long Term Goals - 11/21/15 1552    PT LONG TERM GOAL #1   Title Improve posture and alignment with patient to demonstrate resolved lateral shift Rt of trunk 12/28/15   Time 6   Period Weeks   Status On-going   PT LONG TERM GOAL #2   Title increased trunk ROM and mobilty to WFL's with painfree 12/28/15   Time 6   Period Weeks   Status On-going   PT LONG TERM GOAL #3   Title Patient to tolerate sitting; stanidng; walking for 15 min without pain 12/28/15   Time 6   Period Weeks   Status On-going   PT LONG TERM GOAL #4   Title Independent in HEP 12/28/15   Time 6   Period Weeks   Status On-going   PT LONG TERM GOAL #5   Title Improve FOTO to </= 45% limitation 12/28/15   Time 6    Period Weeks   Status On-going               Plan - 11/21/15 1520    Clinical Impression Statement Continued significant pain in the LB and Lt hip and LE pain . She has not been able to limit activity and rest due to husbands surgeries. Good response to TDN. Will continue efforts. No goals accomplished to date.    Rehab Potential Good   PT Frequency 2x / week   PT Duration 6 weeks   PT Treatment/Interventions Patient/family education;ADLs/Self Care Home Management;Cryotherapy;Electrical Stimulation;Iontophoresis 4mg /ml Dexamethasone;Moist Heat;Ultrasound;Traction;Manual techniques;Neuromuscular re-education;Dry needling;Therapeutic activities;Therapeutic exercise   PT Next Visit Plan core stabilization; manual work vs TDN; back care education    Consulted and Agree with Plan of Care Patient      Patient will benefit from skilled therapeutic intervention in order to improve the following deficits and impairments:  Postural dysfunction, Improper body mechanics, Pain, Increased fascial restricitons, Increased muscle spasms, Decreased mobility, Decreased strength, Decreased endurance, Decreased activity tolerance  Visit Diagnosis: Bilateral low back pain with left-sided sciatica  Abnormal posture  Myalgia  Other symptoms and signs involving the musculoskeletal system     Problem List Patient Active Problem List   Diagnosis Date Noted  . Lumbosacral strain 11/13/2015  . Left knee pain 10/01/2015  . Hypoglycemia 06/12/2015  . Anemia 05/02/2015  . Right leg swelling 01/16/2015  . Cubital tunnel syndrome on right 09/14/2014  . Degenerative disc disease, cervical 07/27/2014  . Prediabetes 09/01/2013  . Fibromyalgia 04/11/2013  . Essential hypertension, benign 04/11/2013  . Anxiety state 04/11/2013  . Sebaceous cyst 04/11/2013  . Pyelonephritis 03/14/2013    Delbra Zellars Nilda Simmer PT, MPH  11/21/2015, 3:53 PM  [facility], [place], [address] Phone: [phone]   Fax:  [phone]  Name: CARIAH TOBEY MRN: [id number] Date of Birth: Jun 12, 1965

## 2015-11-23 ENCOUNTER — Ambulatory Visit (INDEPENDENT_AMBULATORY_CARE_PROVIDER_SITE_OTHER): Payer: 59 | Admitting: Rehabilitative and Restorative Service Providers"

## 2015-11-23 ENCOUNTER — Encounter: Payer: Self-pay | Admitting: Rehabilitative and Restorative Service Providers"

## 2015-11-23 DIAGNOSIS — R29898 Other symptoms and signs involving the musculoskeletal system: Secondary | ICD-10-CM | POA: Diagnosis not present

## 2015-11-23 DIAGNOSIS — R293 Abnormal posture: Secondary | ICD-10-CM

## 2015-11-23 DIAGNOSIS — M5442 Lumbago with sciatica, left side: Secondary | ICD-10-CM | POA: Diagnosis not present

## 2015-11-23 DIAGNOSIS — M791 Myalgia, unspecified site: Secondary | ICD-10-CM

## 2015-11-23 NOTE — Therapy (Addendum)
[place], [place], [address] Phone: [phone]   Fax:  [phone]  Physical Therapy Treatment  Patient Details  Name: Stacy Woods MRN: [id number] Date of Birth: 20-Apr-1966 Referring Provider: Dr Lynne Leader   Encounter Date: 11/23/2015      PT End of Session - 11/23/15 0900    Visit Number 3   Number of Visits 12   Date for PT Re-Evaluation 12/28/15   PT Start Time 3329   PT Stop Time 0939   PT Time Calculation (min) 55 min   Activity Tolerance Patient tolerated treatment well      Past Medical History  Diagnosis Date  . Hypertension   . Fibromyalgia   . Anemia   . PONV (postoperative nausea and vomiting)     Past Surgical History  Procedure Laterality Date  . Tonsillectomy  1977  . Appendectomy    . Cholecystectomy    . Abdominal hysterectomy  1997  . Cesarean section  [id number]  . Knee arthroscopy  2002  . Gastric bypass  2011  . Ulnar nerve transposition Right 06/12/2015    Procedure: RIGHT CUBITAL TUNNEL RELEASE;  Surgeon: Milly Jakob, MD;  Location: [facility];  Service: Orthopedics;  Laterality: Right;    There were no vitals filed for this visit.      Subjective Assessment - 11/23/15 0857    Subjective Really sore from the TDN - thinks it may help some. Continues to have pain in the Lt hip and back. Patient returns to Dr. Georgina Snell Tuesday and would like to see him before she schedules further treatment. She may like to transfer PT to [facility] if she feels she does not need TDN - discussed the possibility of attending PT one day a week in [place] and one day a week at [facility].    Currently in Pain? Yes   Pain Score 6    Pain Location Back   Pain Orientation Left;Lower   Pain Descriptors / Indicators Throbbing;Sharp   Pain Type Acute pain   Pain Radiating Towards Lt hip and buttock - back of the thigh    Pain Onset 1 to 4 weeks ago   Pain  Frequency Constant            [facility] PT Assessment - 11/23/15 0001    Assessment   Medical Diagnosis lumbosacral strain   Referring Provider Dr Lynne Leader    Onset Date/Surgical Date 10/22/15   Hand Dominance Right   Next MD Visit 11/27/15   Posture/Postural Control   Posture Comments head forward; shoudlers rounded and elevated; upper trunk shifted to Rt; forward flexed at hips   AROM   Lumbar Flexion 605   Lumbar Extension 50%   Lumbar - Right Side Bend 605   Lumbar - Left Side Bend 55%   Lumbar - Right Rotation 305   Lumbar - Left Rotation 25%   Flexibility   Hamstrings Rt 75 deg; Lt 70 deg    Quadriceps tight bilat end range    ITB WFL's   Piriformis tight Lt    Palpation   Spinal mobility WFL's   SI assessment  tender with spring testing Lt    Palpation comment improving tenderness and tightness to palpation through Lt hip abductors/rotators including piriformis and glut med    Special Tests    Special Tests --  SLR (-)    Ambulation/Gait   Gait Comments WFL's  [facility] Adult PT Treatment/Exercise - 11/23/15 0001    Lumbar Exercises: Stretches   Passive Hamstring Stretch 3 reps;30 seconds   ITB Stretch 2 reps;30 seconds   Piriformis Stretch 3 reps;30 seconds   Lumbar Exercises: Aerobic   Stationary Bike Nustep L5 x 5 min    Lumbar Exercises: Standing   Other Standing Lumbar Exercises alt hip ext x 10; hip abduction x 10 engaging core    Lumbar Exercises: Supine   Ab Set --  3 part core 10 sec x 10   Bent Knee Raise 10 reps   Other Supine Lumbar Exercises dead bug x 10    Lumbar Exercises: Prone   Other Prone Lumbar Exercises pelvic press 5 sec x 10; alt hip ext in prone x 10    Moist Heat Therapy   Number Minutes Moist Heat 15 Minutes   Moist Heat Location Lumbar Spine;Hip  Lt   Acupuncturist Location bilat lumbar spine L5; Lt piriformis/hip abductor    Electrical Stimulation Action IFC    Electrical Stimulation Parameters to tolerance   Electrical Stimulation Goals Pain;Tone   Manual Therapy   Manual therapy comments pt prone    Soft tissue mobilization Lt piriformis; hip abductors    Myofascial Release Lt hip                 PT Education - 11/23/15 0911    Education provided Yes   Education Details HEP    Person(s) Educated Patient   Methods Explanation;Demonstration;Tactile cues;Verbal cues;Handout   Comprehension Verbalized understanding;Returned demonstration;Verbal cues required;Tactile cues required          PT Short Term Goals - 10/03/15 1647    PT SHORT TERM GOAL #1   Title independent with initial HEP   Time 2   Period Weeks   Status New           PT Long Term Goals - 11/21/15 1552    PT LONG TERM GOAL #1   Title Improve posture and alignment with patient to demonstrate resolved lateral shift Rt of trunk 12/28/15   Time 6   Period Weeks   Status On-going   PT LONG TERM GOAL #2   Title increased trunk ROM and mobilty to WFL's with painfree 12/28/15   Time 6   Period Weeks   Status On-going   PT LONG TERM GOAL #3   Title Patient to tolerate sitting; stanidng; walking for 15 min without pain 12/28/15   Time 6   Period Weeks   Status On-going   PT LONG TERM GOAL #4   Title Independent in HEP 12/28/15   Time 6   Period Weeks   Status On-going   PT LONG TERM GOAL #5   Title Improve FOTO to </= 45% limitation 12/28/15   Time 6   Period Weeks   Status On-going               Plan - 11/23/15 0933    Clinical Impression Statement Improved tolerance for exercise. Responded well to TDN and manual therapy through LB and Lt hip area. Progressing gradually toward stated goals of treatment. Will benefit fro mcontinued rehab to progress with core stabilization and HEP.    Rehab Potential Good   PT Frequency 2x / week   PT Duration 6 weeks   PT Treatment/Interventions Patient/family education;ADLs/Self Care Home  Management;Cryotherapy;Electrical Stimulation;Iontophoresis 38m/ml Dexamethasone;Moist Heat;Ultrasound;Traction;Manual techniques;Neuromuscular re-education;Dry needling;Therapeutic activities;Therapeutic exercise   PT Next Visit Plan core stabilization;  manual work vs TDN; back care education    Consulted and Agree with Plan of Care Patient      Patient will benefit from skilled therapeutic intervention in order to improve the following deficits and impairments:  Postural dysfunction, Improper body mechanics, Pain, Increased fascial restricitons, Increased muscle spasms, Decreased mobility, Decreased strength, Decreased endurance, Decreased activity tolerance  Visit Diagnosis: Bilateral low back pain with left-sided sciatica  Abnormal posture  Myalgia  Other symptoms and signs involving the musculoskeletal system     Problem List Patient Active Problem List   Diagnosis Date Noted  . Lumbosacral strain 11/13/2015  . Left knee pain 10/01/2015  . Hypoglycemia 06/12/2015  . Anemia 05/02/2015  . Right leg swelling 01/16/2015  . Cubital tunnel syndrome on right 09/14/2014  . Degenerative disc disease, cervical 07/27/2014  . Prediabetes 09/01/2013  . Fibromyalgia 04/11/2013  . Essential hypertension, benign 04/11/2013  . Anxiety state 04/11/2013  . Sebaceous cyst 04/11/2013  . Pyelonephritis 03/14/2013    Kelia Gibbon Nilda Simmer PT,  MPH  11/23/2015, 10:05 AM  [facility] [place], [place], [address] Phone: [phone]   Fax:  [phone]  Name: JASARA CORRIGAN MRN: [id number] Date of Birth: June 30, 1965  PHYSICAL THERAPY DISCHARGE SUMMARY  Visits from Start of Care: 3  Current functional level related to goals / functional outcomes: Some improvement with treatment including TDN and there ex    Remaining deficits: Continued pain    Education / Equipment: HEP  Plan: Patient agrees to discharge.  Patient goals  were partially met. Patient is being discharged due to not returning since the last visit.  ?????       Dandria Griego P. Helene Kelp PT, MPH 03/31/16 3:58 PM

## 2015-11-23 NOTE — Patient Instructions (Signed)
Combination (Hook-Lying)    Tighten stomach and slowly raise left leg and lower opposite arm over head. Keep trunk rigid. Repeat _10___ times per set. Do __2-3__ sets per session. Do __2__ sessions per day.     Bridging    Slowly raise buttocks from floor, keeping stomach tight. Repeat __10__ times per set. Do __1-3__ sets per session. Do _2___ sessions per day.   Pelvic Press    Place hands under belly between navel and pubic bone, palms up. Feel pressure on hands. Increase pressure on hands by pressing pelvis down. This is NOT a pelvic tilt. Hold _5__ seconds. Relax. Repeat _10__ times.   Leg Lift: One-Leg    Press pelvis down. Keep knee straight; lengthen and lift one leg (from waist). Do not twist body. Keep other leg down. Hold __2_ seconds. Relax. Repeat 10 time. Alternate legs

## 2015-11-27 ENCOUNTER — Ambulatory Visit (INDEPENDENT_AMBULATORY_CARE_PROVIDER_SITE_OTHER): Payer: 59 | Admitting: Family Medicine

## 2015-11-27 ENCOUNTER — Encounter: Payer: Self-pay | Admitting: Family Medicine

## 2015-11-27 VITALS — BP 123/89 | HR 66 | Wt 188.0 lb

## 2015-11-27 DIAGNOSIS — S39012A Strain of muscle, fascia and tendon of lower back, initial encounter: Secondary | ICD-10-CM

## 2015-11-27 DIAGNOSIS — S338XXA Sprain of other parts of lumbar spine and pelvis, initial encounter: Secondary | ICD-10-CM

## 2015-11-27 DIAGNOSIS — M7062 Trochanteric bursitis, left hip: Secondary | ICD-10-CM | POA: Diagnosis not present

## 2015-11-27 NOTE — Progress Notes (Signed)
       Stacy Woods is a 50 y.o. female who presents to [facility] today for follow-up back pain. Patient notes improved left-sided low back pain with physical therapy. She does note however that her pain is migrated to the left lateral hip and is worse when she stands from a seated position and lies on that side at night. No significant radiating pain weakness or numbness.   Past Medical History  Diagnosis Date  . Hypertension   . Fibromyalgia   . Anemia   . PONV (postoperative nausea and vomiting)    Past Surgical History  Procedure Laterality Date  . Tonsillectomy  1977  . Appendectomy    . Cholecystectomy    . Abdominal hysterectomy  1997  . Cesarean section  [id number]  . Knee arthroscopy  2002  . Gastric bypass  2011  . Ulnar nerve transposition Right 06/12/2015    Procedure: RIGHT CUBITAL TUNNEL RELEASE;  Surgeon: Milly Jakob, MD;  Location: [facility];  Service: Orthopedics;  Laterality: Right;   Social History  Substance Use Topics  . Smoking status: Never Smoker   . Smokeless tobacco: Not on file  . Alcohol Use: No   family history includes Diabetes in her father; Heart failure in her father; Hypertension in her father; Stroke in her father.  ROS as above:  Medications: Current Outpatient Prescriptions  Medication Sig Dispense Refill  . AMBULATORY NON FORMULARY MEDICATION One Touch Glucometer per insurance formulary. One month of testing strips, and lancets used to check blood sugar daily. 1 Units prn  . Diclofenac Sodium 2 % SOLN Place 2 sprays onto the skin 2 (two) times daily. 1 Bottle 11  . escitalopram (LEXAPRO) 10 MG tablet Take 1 tablet (10 mg total) by mouth daily. 90 tablet 3  . ferrous sulfate 325 (65 FE) MG EC tablet Take 1 tablet (325 mg total) by mouth 2 (two) times daily with a meal. (Patient taking differently: Take 325 mg  by mouth 4 (four) times daily. )  3  . lisinopril-hydrochlorothiazide (PRINZIDE,ZESTORETIC) 20-12.5 MG tablet Take 1 tablet by mouth daily. 90 tablet 2  . traZODone (DESYREL) 100 MG tablet Take 1 tablet (100 mg total) by mouth at bedtime. 30 tablet 5   No current facility-administered medications for this visit.   Allergies  Allergen Reactions  . Mobic [Meloxicam] Swelling  . Shellfish Allergy Nausea Only and Swelling  . Percocet [Oxycodone-Acetaminophen] Itching  . Lyrica [Pregabalin] Other (See Comments)    "zombie"     Exam:  BP 123/89 mmHg  Pulse 66  Wt 188 lb (85.276 kg) Gen: Well NAD Back nontender to midline tender palpation left SI joint. Hip normal-appearing bilaterally normal motion bilaterally Tender palpation left greater trochanteric bursa area. Normal gait.  No results found for this or any previous visit (from the past 24 hour(s)). No results found.    Assessment and Plan: 50 y.o. female with  Resolving left-sided low back pain and muscle spasms with physical therapy with new onset left greater trochanteric bursitis. [facility] dedicated physical therapy for trochanteric bursitis and continued physical therapy for left-sided low back pain. Recheck in one month.  Discussed warning signs or symptoms. Please see discharge instructions. Patient expresses understanding.

## 2015-11-27 NOTE — Patient Instructions (Signed)
Thank you for coming in today. Return in 1 month.  Go to PT.   Trochanteric Bursitis You have hip pain due to trochanteric bursitis. Bursitis means that the sack near the outside of the hip is filled with fluid and inflamed. This sack is made up of protective soft tissue. The pain from trochanteric bursitis can be severe and keep you from sleep. It can radiate to the buttocks or down the outside of the thigh to the knee. The pain is almost always worse when rising from the seated or lying position and with walking. Pain can improve after you take a few steps. It happens more often in people with hip joint and lumbar spine problems, such as arthritis or previous surgery. Very rarely the trochanteric bursa can become infected, and antibiotics and/or surgery may be needed. Treatment often includes an injection of local anesthetic mixed with cortisone medicine. This medicine is injected into the area where it is most tender over the hip. Repeat injections may be necessary if the response to treatment is slow. You can apply ice packs over the tender area for 30 minutes every 2 hours for the next few days. Anti-inflammatory and/or narcotic pain medicine may also be helpful. Limit your activity for the next few days if the pain continues. See your caregiver in 5-10 days if you are not greatly improved.  SEEK IMMEDIATE MEDICAL CARE IF:  You develop severe pain, fever, or increased redness.  You have pain that radiates below the knee. EXERCISES STRETCHING EXERCISES - Trochanteric Bursitis  These exercises may help you when beginning to rehabilitate your injury. Your symptoms may resolve with or without further involvement from your physician, physical therapist, or athletic trainer. While completing these exercises, remember:   Restoring tissue flexibility helps normal motion to return to the joints. This allows healthier, less painful movement and activity.  An effective stretch should be held for at least  30 seconds.  A stretch should never be painful. You should only feel a gentle lengthening or release in the stretched tissue. STRETCH - Iliotibial Band  On the floor or bed, lie on your side so your injured leg is on top. Bend your knee and grab your ankle.  Slowly bring your knee back so that your thigh is in line with your trunk. Keep your heel at your buttocks and gently arch your back so your head, shoulders and hips line up.  Slowly lower your leg so that your knee approaches the floor/bed until you feel a gentle stretch on the outside of your thigh. If you do not feel a stretch and your knee will not fall farther, place the heel of your opposite foot on top of your knee and pull your thigh down farther.  Hold this stretch for __________ seconds.  Repeat __________ times. Complete this exercise __________ times per day. STRETCH - Hamstrings, Supine   Lie on your back. Loop a belt or towel over the ball of your foot as shown.  Straighten your knee and slowly pull on the belt to raise your injured leg. Do not allow the knee to bend. Keep your opposite leg flat on the floor.  Raise the leg until you feel a gentle stretch behind your knee or thigh. Hold this position for __________ seconds.  Repeat __________ times. Complete this stretch __________ times per day. STRETCH - Quadriceps, Prone   Lie on your stomach on a firm surface, such as a bed or padded floor.  Bend your knee and grasp  your ankle. If you are unable to reach your ankle or pant leg, use a belt around your foot to lengthen your reach.  Gently pull your heel toward your buttocks. Your knee should not slide out to the side. You should feel a stretch in the front of your thigh and/or knee.  Hold this position for __________ seconds.  Repeat __________ times. Complete this stretch __________ times per day. STRETCHING - Hip Flexors, Lunge Half kneel with your knee on the floor and your opposite knee bent and directly  over your ankle.  Keep good posture with your head over your shoulders. Tighten your buttocks to point your tailbone downward; this will prevent your back from arching too much.  You should feel a gentle stretch in the front of your thigh and/or hip. If you do not feel any resistance, slightly slide your opposite foot forward and then slowly lunge forward so your knee once again lines up over your ankle. Be sure your tailbone remains pointed downward.  Hold this stretch for __________ seconds.  Repeat __________ times. Complete this stretch __________ times per day. STRETCH - Adductors, Lunge  While standing, spread your legs.  Lean away from your injured leg by bending your opposite knee. You may rest your hands on your thigh for balance.  You should feel a stretch in your inner thigh. Hold for __________ seconds.  Repeat __________ times. Complete this exercise __________ times per day.   This information is not intended to replace advice given to you by your health care provider. Make sure you discuss any questions you have with your health care provider.   Document Released: 07/03/2004 Document Revised: 10/10/2014 Document Reviewed: 09/07/2008 Elsevier Interactive Patient Education [employer].

## 2015-12-27 ENCOUNTER — Ambulatory Visit (INDEPENDENT_AMBULATORY_CARE_PROVIDER_SITE_OTHER): Payer: 59 | Admitting: Family Medicine

## 2015-12-27 ENCOUNTER — Encounter: Payer: Self-pay | Admitting: Family Medicine

## 2015-12-27 VITALS — BP 128/83 | HR 83 | Wt 190.0 lb

## 2015-12-27 DIAGNOSIS — M7062 Trochanteric bursitis, left hip: Secondary | ICD-10-CM | POA: Diagnosis not present

## 2015-12-27 DIAGNOSIS — D649 Anemia, unspecified: Secondary | ICD-10-CM

## 2015-12-27 DIAGNOSIS — I10 Essential (primary) hypertension: Secondary | ICD-10-CM

## 2015-12-27 NOTE — Patient Instructions (Signed)
Your blood pressure is well controlled.  Keep taking your current medications.  Follow up in 3 months or so for a wellness check for lab work, flu shot, and health maintenance check.

## 2015-12-27 NOTE — Progress Notes (Signed)
Stacy Woods is a 50 y.o. female who presents to [place]: [place] today for follow up of left lateral hip pain and to establish care.  She has been taking Tumeric and Co Q10 for the past month with significant improvement in her pain.  She denies numbness or tingling.    Other medical problems being addressed: Hypertension- well controlled with diet, exercise, and lisinopril-HCTZ 20-12.5 mg daily.  No chest pain, dyspnea, or palpitations Fibromyalgia- not currently taking any medication, but states she isn't having any problems at this time. Iron deficiency anemia: taking Fe sulfate 325 mg daily.  No fatigue or weakness. Pre-diabetes: not taking any medication.  Has been running low (40s)  in the morning causing the patient to feel sweaty and disoriented.  Denies syncope, polyuria, and polydipsia.     Past Medical History  Diagnosis Date  . Hypertension   . Fibromyalgia   . Anemia   . PONV (postoperative nausea and vomiting)    Past Surgical History  Procedure Laterality Date  . Tonsillectomy  1977  . Appendectomy    . Cholecystectomy    . Abdominal hysterectomy  1997  . Cesarean section  [id number]  . Knee arthroscopy  2002  . Gastric bypass  2011  . Ulnar nerve transposition Right 06/12/2015    Procedure: RIGHT CUBITAL TUNNEL RELEASE;  Surgeon: Milly Jakob, MD;  Location: [facility];  Service: Orthopedics;  Laterality: Right;   Social History  Substance Use Topics  . Smoking status: Never Smoker   . Smokeless tobacco: Not on file  . Alcohol Use: No   family history includes Diabetes in her father; Heart failure in her father; Hypertension in her father; Stroke in her father.  ROS as above:  Medications: Current Outpatient Prescriptions  Medication Sig Dispense Refill  . AMBULATORY NON FORMULARY MEDICATION One Touch Glucometer per insurance  formulary. One month of testing strips, and lancets used to check blood sugar daily. 1 Units prn  . Diclofenac Sodium 2 % SOLN Place 2 sprays onto the skin 2 (two) times daily. 1 Bottle 11  . escitalopram (LEXAPRO) 10 MG tablet Take 1 tablet (10 mg total) by mouth daily. 90 tablet 3  . ferrous sulfate 325 (65 FE) MG EC tablet Take 1 tablet (325 mg total) by mouth 2 (two) times daily with a meal. (Patient taking differently: Take 325 mg by mouth 4 (four) times daily. )  3  . lisinopril-hydrochlorothiazide (PRINZIDE,ZESTORETIC) 20-12.5 MG tablet Take 1 tablet by mouth daily. 90 tablet 2  . traZODone (DESYREL) 100 MG tablet Take 1 tablet (100 mg total) by mouth at bedtime. 30 tablet 5   No current facility-administered medications for this visit.   Allergies  Allergen Reactions  . Mobic [Meloxicam] Swelling  . Shellfish Allergy Nausea Only and Swelling  . Percocet [Oxycodone-Acetaminophen] Itching  . Lyrica [Pregabalin] Other (See Comments)    "zombie"     Exam:  BP 128/83 mmHg  Pulse 83  Wt 190 lb (86.183 kg) Gen: Well NAD Lungs: Normal work of breathing. CTABL Heart: RRR no MRG Abd: NABS, Soft. Nondistended, Nontender Exts: Brisk capillary refill, warm and well perfused.  Left hip: normal appearance. Full range of motion without pain Nontender over the greater trochanter. No pain with resisted abduction  No results found for this or any previous visit (from the past 24 hour(s)). No results found.    Assessment and Plan: 50 y.o. female  with improving left trochanteric bursitis on tumeric and CoQ10.  Continue with current medications.  Will defer labs to her wellness visit in the next 3 months.  Encouraged to eat when she wakes up to avoid hypoglycemic state.    Discussed warning signs or symptoms. Please see discharge instructions. Patient expresses understanding.

## 2016-02-26 ENCOUNTER — Other Ambulatory Visit: Payer: Self-pay | Admitting: Sports Medicine

## 2016-02-26 DIAGNOSIS — F411 Generalized anxiety disorder: Secondary | ICD-10-CM

## 2016-02-26 MED FILL — LISINOPRIL-HCTZ 20-12.5 MG: 20-12.5 | 90 days supply | Qty: 90 | Fill #1

## 2016-02-26 MED FILL — ESCITALOPRAM 10 MG TABLET: 10 | 90 days supply | Qty: 90 | Fill #0

## 2016-03-24 ENCOUNTER — Ambulatory Visit (INDEPENDENT_AMBULATORY_CARE_PROVIDER_SITE_OTHER): Payer: 59 | Admitting: Family Medicine

## 2016-03-24 ENCOUNTER — Encounter: Payer: Self-pay | Admitting: Family Medicine

## 2016-03-24 VITALS — BP 116/71 | HR 56 | Wt 185.0 lb

## 2016-03-24 DIAGNOSIS — E8940 Asymptomatic postprocedural ovarian failure: Secondary | ICD-10-CM

## 2016-03-24 DIAGNOSIS — Z803 Family history of malignant neoplasm of breast: Secondary | ICD-10-CM

## 2016-03-24 DIAGNOSIS — I10 Essential (primary) hypertension: Secondary | ICD-10-CM

## 2016-03-24 DIAGNOSIS — Z1231 Encounter for screening mammogram for malignant neoplasm of breast: Secondary | ICD-10-CM

## 2016-03-24 DIAGNOSIS — Z0000 Encounter for general adult medical examination without abnormal findings: Secondary | ICD-10-CM | POA: Diagnosis not present

## 2016-03-24 DIAGNOSIS — D649 Anemia, unspecified: Secondary | ICD-10-CM

## 2016-03-24 DIAGNOSIS — Z23 Encounter for immunization: Secondary | ICD-10-CM | POA: Diagnosis not present

## 2016-03-24 DIAGNOSIS — D509 Iron deficiency anemia, unspecified: Secondary | ICD-10-CM

## 2016-03-24 DIAGNOSIS — Z1239 Encounter for other screening for malignant neoplasm of breast: Secondary | ICD-10-CM

## 2016-03-24 DIAGNOSIS — R7303 Prediabetes: Secondary | ICD-10-CM

## 2016-03-24 HISTORY — DX: Asymptomatic postprocedural ovarian failure: E89.40

## 2016-03-24 HISTORY — DX: Family history of malignant neoplasm of breast: Z80.3

## 2016-03-24 LAB — FERRITIN: FERRITIN: 6 ng/mL — AB (ref 10–232)

## 2016-03-24 LAB — COMPLETE METABOLIC PANEL WITH GFR
ALBUMIN: 4.2 g/dL (ref 3.6–5.1)
ALK PHOS: 110 U/L (ref 33–115)
ALT: 14 U/L (ref 6–29)
AST: 23 U/L (ref 10–35)
BUN: 9 mg/dL (ref 7–25)
CHLORIDE: 108 mmol/L (ref 98–110)
CO2: 23 mmol/L (ref 20–31)
Calcium: 9.5 mg/dL (ref 8.6–10.2)
Creat: 0.82 mg/dL (ref 0.50–1.10)
GFR, EST NON AFRICAN AMERICAN: 85 mL/min (ref >=60)
GFR, Est African American: >89 mL/min (ref >=60)
GLUCOSE: 87 mg/dL (ref 65–99)
POTASSIUM: 4.1 mmol/L (ref 3.5–5.3)
SODIUM: 142 mmol/L (ref 135–146)
Total Bilirubin: 0.7 mg/dL (ref 0.2–1.2)
Total Protein: 7.3 g/dL (ref 6.1–8.1)

## 2016-03-24 LAB — CBC
HCT: 36.9 % (ref 35.0–45.0)
Hemoglobin: 11.4 g/dL — ABNORMAL LOW (ref 11.7–15.5)
MCH: 24.3 pg — AB (ref 27.0–33.0)
MCHC: 30.9 g/dL — AB (ref 32.0–36.0)
MCV: 78.7 fL — ABNORMAL LOW (ref 80.0–100.0)
MPV: 10.3 fL (ref 7.5–12.5)
PLATELETS: 330 10*3/uL (ref 140–400)
RBC: 4.69 MIL/uL (ref 3.80–5.10)
RDW: 17.6 % — AB (ref 11.0–15.0)
WBC: 4.7 10*3/uL (ref 3.8–10.8)

## 2016-03-24 LAB — HEMOGLOBIN A1C
Hgb A1c MFr Bld: 5.4 % (ref <5.7)
Mean Plasma Glucose: 108 mg/dL

## 2016-03-24 LAB — LIPID PANEL
CHOLESTEROL: 162 mg/dL (ref 125–200)
HDL: 38 mg/dL — AB (ref >=46)
LDL Cholesterol: 107 mg/dL (ref <130)
TRIGLYCERIDES: 85 mg/dL (ref <150)
Total CHOL/HDL Ratio: 4.3 Ratio (ref <=5.0)
VLDL: 17 mg/dL (ref <30)

## 2016-03-24 LAB — TSH: TSH: 2.31 mIU/L

## 2016-03-24 LAB — IRON AND TIBC
%SAT: 5 % — AB (ref 11–50)
IRON: 23 ug/dL — AB (ref 40–190)
TIBC: 479 ug/dL — ABNORMAL HIGH (ref 250–450)
UIBC: 456 ug/dL — ABNORMAL HIGH (ref 125–400)

## 2016-03-24 NOTE — Addendum Note (Signed)
Addended by: Darla Lesches T on: 03/24/2016 09:13 AM   Modules accepted: Orders

## 2016-03-24 NOTE — Progress Notes (Signed)
Stacy Woods is a 50 y.o. female who presents to [facility]: [place] today for well adult visit. She is doing well with no acute medical problems. No fevers or chills vomiting or diarrhea. She takes the medications listed below. She tries to exercise somewhat regularly. She notes exercise helps her fibromyalgia symptoms. She has a history of abdominal hysterectomy and oophorectomy with surgical menopause. She no longer is using any hormone replacement therapy as her mother about breast cancer in her 29s. Her last mammogram was about a year ago. She no longer receives Pap smears. She has not had a bone density test. She thinks she is due for both influenza and Tdap. She denies any depression symptoms. She feels well.   Past Medical History:  Diagnosis Date  . Anemia   . Anemia, iron deficiency 05/02/2015  . Fibromyalgia   . Hypertension   . PONV (postoperative nausea and vomiting)   . Surgical menopause 03/24/2016   Past Surgical History:  Procedure Laterality Date  . ABDOMINAL HYSTERECTOMY  1997  . APPENDECTOMY    . CESAREAN SECTION  [id number]  . CHOLECYSTECTOMY    . GASTRIC BYPASS  2011  . KNEE ARTHROSCOPY  2002  . TONSILLECTOMY  1977  . ULNAR NERVE TRANSPOSITION Right 06/12/2015   Procedure: RIGHT CUBITAL TUNNEL RELEASE;  Surgeon: Milly Jakob, MD;  Location: [facility];  Service: Orthopedics;  Laterality: Right;   Social History  Substance Use Topics  . Smoking status: Never Smoker  . Smokeless tobacco: Not on file  . Alcohol use No   family history includes Diabetes in her father; Heart failure in her father; Hypertension in her father; Stroke in her father.  ROS as above: No headache, visual changes, nausea, vomiting, diarrhea, constipation, dizziness, abdominal pain, skin rash, fevers, chills, night sweats, weight loss, swollen lymph nodes, body  aches, joint swelling, muscle aches, chest pain, shortness of breath, mood changes, visual or auditory hallucinations.    Medications: Current Outpatient Prescriptions  Medication Sig Dispense Refill  . AMBULATORY NON FORMULARY MEDICATION One Touch Glucometer per insurance formulary. One month of testing strips, and lancets used to check blood sugar daily. 1 Units prn  . escitalopram (LEXAPRO) 10 MG tablet TAKE 1 TABLET (10 MG TOTAL) BY MOUTH DAILY. 90 tablet 3  . ferrous sulfate 325 (65 FE) MG EC tablet Take 1 tablet (325 mg total) by mouth 2 (two) times daily with a meal. (Patient taking differently: Take 325 mg by mouth 4 (four) times daily. )  3  . lisinopril-hydrochlorothiazide (PRINZIDE,ZESTORETIC) 20-12.5 MG tablet Take 1 tablet by mouth daily. 90 tablet 2   No current facility-administered medications for this visit.    Allergies  Allergen Reactions  . Mobic [Meloxicam] Swelling  . Shellfish Allergy Nausea Only and Swelling  . Percocet [Oxycodone-Acetaminophen] Itching  . Lyrica [Pregabalin] Other (See Comments)    "zombie"    Health Maintenance Health Maintenance  Topic Date Due  . HIV Screening  02/26/1983  . INFLUENZA VACCINE  01/08/2016  . TETANUS/TDAP  06/09/2016     Exam:  BP 116/71   Pulse (!) 56   Wt 185 lb (83.9 kg)   BMI 31.76 kg/m  Gen: Well NAD HEENT: EOMI,  MMM no goiter Lungs: Normal work of breathing. CTABL Heart: RRR no MRG Abd: NABS, Soft. Nondistended, Nontender Exts: Brisk capillary refill, warm and well perfused.  Depression screen [facility] 2/9 03/24/2016  Decreased Interest 0  Down, Depressed, Hopeless 0  PHQ - 2 Score 0      No results found for this or any previous visit (from the past 72 hour(s)). No results found.    Assessment and Plan: 50 y.o. female with  Well adult. Doing reasonably well. No acute issues to address today. We'll update fasting labs listed below. Additionally will order mammogram for late November as well as bone  density test given her history of surgical menopause several years ago. Continue current medications. Recheck in 6-12 months or sooner if needed.  Tdap and influenza vaccines given today.   Orders Placed This Encounter  Procedures  . DG Bone Density    Standing Status:   Future    Standing Expiration Date:   05/25/2017    Order Specific Question:   Reason for exam:    Answer:   screen bone denisity. Surgical menopause    Order Specific Question:   Preferred imaging location?    Answer:   [facility]  . MM DIGITAL SCREENING BILATERAL    Standing Status:   Future    Standing Expiration Date:   05/24/2017    Order Specific Question:   Is the patient pregnant?    Answer:   No    Order Specific Question:   Preferred imaging location?    Answer:   [facility]    Order Specific Question:   Reason for exam:    Answer:   screen breast ca after Nov 30th  . Flu Vaccine QUAD 36+ mos IM  . CBC  . COMPLETE METABOLIC PANEL WITH GFR  . Hemoglobin A1c  . Lipid panel  . VITAMIN D 25 Hydroxy (Vit-D Deficiency, Fractures)  . TSH  . Iron and TIBC  . Ferritin    Discussed warning signs or symptoms. Please see discharge instructions. Patient expresses understanding.

## 2016-03-24 NOTE — Patient Instructions (Signed)
Thank you for coming in today. Continue current treatment.  Get mammogram after NOv 30th.  Get bone density test any time.  Get fasting labs soon.  Get vaccines today.

## 2016-03-25 LAB — VITAMIN D 25 HYDROXY (VIT D DEFICIENCY, FRACTURES): Vit D, 25-Hydroxy: 35 ng/mL (ref 30–100)

## 2016-04-09 ENCOUNTER — Encounter: Payer: Self-pay | Admitting: Family Medicine

## 2016-04-10 ENCOUNTER — Telehealth: Payer: Self-pay | Admitting: Family Medicine

## 2016-04-10 NOTE — Telephone Encounter (Signed)
Submitted for approval on Orthovisc. Awaiting confirmation.  

## 2016-04-11 NOTE — Telephone Encounter (Signed)
Received the following information from [employer] benefits investigation:  Patient has PPO plan with an effective date of 02/26/2013. [id number] is covered at 100% & [id number] is covered at 100% of the contracted rate when performed in an office setting. A Copay of $40.00 applies whether or not a Specialist office visit is billed. If Out of Pocket is met, Copay no longer applies. REF# [id number]   Called and spoke with Pt, appts scheduled.

## 2016-04-14 ENCOUNTER — Ambulatory Visit (INDEPENDENT_AMBULATORY_CARE_PROVIDER_SITE_OTHER): Payer: 59 | Admitting: Family Medicine

## 2016-04-14 DIAGNOSIS — M170 Bilateral primary osteoarthritis of knee: Secondary | ICD-10-CM | POA: Diagnosis not present

## 2016-04-14 DIAGNOSIS — M1710 Unilateral primary osteoarthritis, unspecified knee: Secondary | ICD-10-CM | POA: Insufficient documentation

## 2016-04-14 DIAGNOSIS — M179 Osteoarthritis of knee, unspecified: Secondary | ICD-10-CM | POA: Insufficient documentation

## 2016-04-14 NOTE — Patient Instructions (Signed)
Thank you for coming in today. Call or go to the emergency room if you get worse, have trouble breathing, have chest pains, or palpitations.  Return in 1 week.

## 2016-04-14 NOTE — Progress Notes (Signed)
Patient presents to clinic today for previously arranged Orthovisc injection Bilateral injection 1/4  Procedure: Real-time Ultrasound Guided Injection of right knee  Device: GE Logiq E  Images permanently stored and available for review in the ultrasound unit. Verbal informed consent obtained. Discussed risks and benefits of procedure. Warned about infection bleeding damage to structures skin hypopigmentation and fat atrophy among others. Patient expresses understanding and agreement Time-out conducted.  Noted no overlying erythema, induration, or other signs of local infection.  Skin prepped in a sterile fashion.  Local anesthesia: Topical Ethyl chloride.  With sterile technique and under real time ultrasound guidance: Orthovisc injected easily.  Completed without difficulty   Advised to call if fevers/chills, erythema, induration, drainage, or persistent bleeding.  Images permanently stored and available for review in the ultrasound unit.  Impression: Technically successful ultrasound guided injection.  Lot number for injection: [id number]  Procedure: Real-time Ultrasound Guided Injection of Left knee  Device: GE Logiq E  Images permanently stored and available for review in the ultrasound unit. Verbal informed consent obtained. Discussed risks and benefits of procedure. Warned about infection bleeding damage to structures skin hypopigmentation and fat atrophy among others. Patient expresses understanding and agreement Time-out conducted.  Noted no overlying erythema, induration, or other signs of local infection.  Skin prepped in a sterile fashion.  Local anesthesia: Topical Ethyl chloride.  With sterile technique and under real time ultrasound guidance: Orthovisc injected easily.  Completed without difficulty   Advised to call if fevers/chills, erythema, induration, drainage, or persistent bleeding.  Images permanently stored and available for review in the  ultrasound unit.  Impression: Technically successful ultrasound guided injection.  Lot number for injection: [id number]  Return in one week for bilateral knee injection 2/4

## 2016-04-21 ENCOUNTER — Ambulatory Visit (INDEPENDENT_AMBULATORY_CARE_PROVIDER_SITE_OTHER): Payer: 59 | Admitting: Family Medicine

## 2016-04-21 VITALS — BP 123/69 | HR 91

## 2016-04-21 DIAGNOSIS — M170 Bilateral primary osteoarthritis of knee: Secondary | ICD-10-CM | POA: Diagnosis not present

## 2016-04-21 NOTE — Progress Notes (Signed)
  Patient presents to clinic today for previously scheduled Orthovisc injection  Procedure: Real-time Ultrasound Guided Injection of right knee  Device: GE Logiq E  Images permanently stored and available for review in the ultrasound unit. Verbal informed consent obtained. Discussed risks and benefits of procedure. Warned about infection bleeding damage to structures skin hypopigmentation and fat atrophy among others. Patient expresses understanding and agreement Time-out conducted.  Noted no overlying erythema, induration, or other signs of local infection.  Skin prepped in a sterile fashion.  Local anesthesia: Topical Ethyl chloride.  With sterile technique and under real time ultrasound guidance: Orthovisc injected easily.  Completed without difficulty   Advised to call if fevers/chills, erythema, induration, drainage, or persistent bleeding.  Images permanently stored and available for review in the ultrasound unit.  Impression: Technically successful ultrasound guided injection.    Procedure: Real-time Ultrasound Guided Injection of Left knee  Device: GE Logiq E  Images permanently stored and available for review in the ultrasound unit. Verbal informed consent obtained. Discussed risks and benefits of procedure. Warned about infection bleeding damage to structures skin hypopigmentation and fat atrophy among others. Patient expresses understanding and agreement Time-out conducted.  Noted no overlying erythema, induration, or other signs of local infection.  Skin prepped in a sterile fashion.  Local anesthesia: Topical Ethyl chloride.  With sterile technique and under real time ultrasound guidance: Orthovisc injected easily.  Completed without difficulty   Advised to call if fevers/chills, erythema, induration, drainage, or persistent bleeding.  Images permanently stored and available for review in the ultrasound unit.  Impression: Technically successful  ultrasound guided injection.    Return in one week for bilateral knee injection 3/4

## 2016-04-21 NOTE — Patient Instructions (Signed)
Thank you for coming in today. Return in 1 week.  Call or go to the ER if you develop a large red swollen joint with extreme pain or oozing puss.   

## 2016-04-28 ENCOUNTER — Encounter: Payer: Self-pay | Admitting: Family Medicine

## 2016-04-28 ENCOUNTER — Ambulatory Visit (INDEPENDENT_AMBULATORY_CARE_PROVIDER_SITE_OTHER): Payer: 59 | Admitting: Family Medicine

## 2016-04-28 VITALS — BP 112/76 | HR 92

## 2016-04-28 DIAGNOSIS — D509 Iron deficiency anemia, unspecified: Secondary | ICD-10-CM

## 2016-04-28 DIAGNOSIS — M170 Bilateral primary osteoarthritis of knee: Secondary | ICD-10-CM | POA: Diagnosis not present

## 2016-04-28 NOTE — Patient Instructions (Signed)
Thank you for coming in today.   Call or go to the ER if you develop a large red swollen joint with extreme pain or oozing puss.    Return as needed.  

## 2016-04-28 NOTE — Progress Notes (Signed)
Stacy Woods is a 50 y.o. female who presents to [facility] today for knee pain and fatigue. Patient has been receiving injections once weekly for Hyaluronic acid for knee pain. Today she presents to clinic for injection series 3/3. She is feeling a bit better.  Fatigue: Patient was found to have iron deficiency anemia thought to be causing her fatigue. She's been taking iron for the last month and does not feel much better. Should like to recheck her iron stores today for possible.  Past Medical History:  Diagnosis Date  . Anemia   . Anemia, iron deficiency 05/02/2015  . Fibromyalgia   . Hypertension   . PONV (postoperative nausea and vomiting)   . Surgical menopause 03/24/2016   Past Surgical History:  Procedure Laterality Date  . ABDOMINAL HYSTERECTOMY  1997  . APPENDECTOMY    . CESAREAN SECTION  [id number]  . CHOLECYSTECTOMY    . GASTRIC BYPASS  2011  . KNEE ARTHROSCOPY  2002  . TONSILLECTOMY  1977  . ULNAR NERVE TRANSPOSITION Right 06/12/2015   Procedure: RIGHT CUBITAL TUNNEL RELEASE;  Surgeon: Milly Jakob, MD;  Location: [facility];  Service: Orthopedics;  Laterality: Right;   Social History  Substance Use Topics  . Smoking status: Never Smoker  . Smokeless tobacco: Not on file  . Alcohol use No     ROS:  As above   Medications: Current Outpatient Prescriptions  Medication Sig Dispense Refill  . AMBULATORY NON FORMULARY MEDICATION One Touch Glucometer per insurance formulary. One month of testing strips, and lancets used to check blood sugar daily. 1 Units prn  . escitalopram (LEXAPRO) 10 MG tablet TAKE 1 TABLET (10 MG TOTAL) BY MOUTH DAILY. 90 tablet 3  . ferrous sulfate 325 (65 FE) MG EC tablet Take 1 tablet (325 mg total) by mouth 2 (two) times daily with a meal. (Patient taking differently: Take 325 mg by mouth 4 (four) times daily. )  3  . lisinopril-hydrochlorothiazide (PRINZIDE,ZESTORETIC) 20-12.5  MG tablet Take 1 tablet by mouth daily. 90 tablet 2   No current facility-administered medications for this visit.    Allergies  Allergen Reactions  . Mobic [Meloxicam] Swelling  . Shellfish Allergy Nausea Only and Swelling  . Percocet [Oxycodone-Acetaminophen] Itching  . Lyrica [Pregabalin] Other (See Comments)    "zombie"     Exam:  BP 112/76   Pulse 92  General: Well Developed, well nourished, and in no acute distress.  Neuro/Psych: Alert and oriented x3, extra-ocular muscles intact, able to move all 4 extremities, sensation grossly intact. Skin: Warm and dry, no rashes noted.  Respiratory: Not using accessory muscles, speaking in full sentences, trachea midline.  Cardiovascular: Pulses palpable, no extremity edema. Abdomen: Does not appear distended. MSK: Knees are unremarkable. Bilaterally without effusion.   Procedure: Real-time Ultrasound Guided Injection of right knee Device: GE Logiq E  Images permanently stored and available for review in the ultrasound unit. Verbal informed consent obtained. Discussed risks and benefits of procedure. Warned about infection bleeding damage to structures skin hypopigmentation and fat atrophy among others. Patient expresses understanding and agreement Time-out conducted.  Noted no overlying erythema, induration, or other signs of local infection.  Skin prepped in a sterile fashion.  Local anesthesia: Topical Ethyl chloride.  With sterile technique and under real time ultrasound guidance: Orthoviscinjected easily.  Completed without difficulty   Advised to call if fevers/chills, erythema, induration, drainage, or persistent bleeding.  Images permanently stored and available for  review in the ultrasound unit.  Impression: Technically successful ultrasound guided injection.    Procedure: Real-time Ultrasound Guided Injection of Leftknee Device: GE Logiq E  Images permanently stored and available for review in the  ultrasound unit. Verbal informed consent obtained. Discussed risks and benefits of procedure. Warned about infection bleeding damage to structures skin hypopigmentation and fat atrophy among others. Patient expresses understanding and agreement Time-out conducted.  Noted no overlying erythema, induration, or other signs of local infection.  Skin prepped in a sterile fashion.  Local anesthesia: Topical Ethyl chloride.  With sterile technique and under real time ultrasound guidance: Orthoviscinjected easily.  Completed without difficulty   Advised to call if fevers/chills, erythema, induration, drainage, or persistent bleeding.  Images permanently stored and available for review in the ultrasound unit.  Impression: Technically successful ultrasound guided injection.     No results found for this or any previous visit (from the past 48 hour(s)). No results found.    Assessment and Plan: 50 y.o. female with knee pain due to DJD. Orthovisc injection 3/3 today. Return as needed.  Fatigue: Check iron stores with orders as below. If stores are still low patient will return to clinic.  Orders Placed This Encounter  Procedures  . CBC  . Ferritin  . Iron and TIBC    Discussed warning signs or symptoms. Please see discharge instructions. Patient expresses understanding.

## 2016-04-29 LAB — CBC
HEMATOCRIT: 36.2 % (ref 35.0–45.0)
HEMOGLOBIN: 11.3 g/dL — AB (ref 11.7–15.5)
MCH: 25.2 pg — AB (ref 27.0–33.0)
MCHC: 31.2 g/dL — AB (ref 32.0–36.0)
MCV: 80.8 fL (ref 80.0–100.0)
MPV: 10.8 fL (ref 7.5–12.5)
Platelets: 372 10*3/uL (ref 140–400)
RBC: 4.48 MIL/uL (ref 3.80–5.10)
RDW: 18.3 % — AB (ref 11.0–15.0)
WBC: 6.1 10*3/uL (ref 3.8–10.8)

## 2016-04-29 LAB — FERRITIN: Ferritin: 5 ng/mL — ABNORMAL LOW (ref 10–232)

## 2016-04-29 LAB — IRON AND TIBC
%SAT: 5 % — AB (ref 11–50)
Iron: 26 ug/dL — ABNORMAL LOW (ref 40–190)
TIBC: 495 ug/dL — ABNORMAL HIGH (ref 250–450)
UIBC: 469 ug/dL — ABNORMAL HIGH (ref 125–400)

## 2016-04-29 NOTE — Addendum Note (Signed)
Addended by: Gregor Hams on: 04/29/2016 12:58 PM   Modules accepted: Orders

## 2016-05-05 ENCOUNTER — Encounter: Payer: Self-pay | Admitting: Family Medicine

## 2016-05-05 ENCOUNTER — Ambulatory Visit (INDEPENDENT_AMBULATORY_CARE_PROVIDER_SITE_OTHER): Payer: 59 | Admitting: Family Medicine

## 2016-05-05 VITALS — BP 135/86 | HR 88

## 2016-05-05 DIAGNOSIS — M170 Bilateral primary osteoarthritis of knee: Secondary | ICD-10-CM | POA: Diagnosis not present

## 2016-05-05 NOTE — Progress Notes (Signed)
Patient presents to clinic today for previously arranged injection  Orthovisc series 4/4 knees bilaterally  Procedure: Real-time Ultrasound Guided Injection of right knee Device: GE Logiq E  Images permanently stored and available for review in the ultrasound unit. Verbal informed consent obtained. Discussed risks and benefits of procedure. Warned about infection bleeding damage to structures skin hypopigmentation and fat atrophy among others. Patient expresses understanding and agreement Time-out conducted.  Noted no overlying erythema, induration, or other signs of local infection.  Skin prepped in a sterile fashion.  Local anesthesia: Topical Ethyl chloride.  With sterile technique and under real time ultrasound guidance: Orthoviscinjected easily.  Completed without difficulty   Advised to call if fevers/chills, erythema, induration, drainage, or persistent bleeding.  Images permanently stored and available for review in the ultrasound unit.  Impression: Technically successful ultrasound guided injection.    Procedure: Real-time Ultrasound Guided Injection of Leftknee Device: GE Logiq E  Images permanently stored and available for review in the ultrasound unit. Verbal informed consent obtained. Discussed risks and benefits of procedure. Warned about infection bleeding damage to structures skin hypopigmentation and fat atrophy among others. Patient expresses understanding and agreement Time-out conducted.  Noted no overlying erythema, induration, or other signs of local infection.  Skin prepped in a sterile fashion.  Local anesthesia: Topical Ethyl chloride.  With sterile technique and under real time ultrasound guidance: Orthoviscinjected easily.  Completed without difficulty   Advised to call if fevers/chills, erythema, induration, drainage, or persistent bleeding.  Images permanently stored and available for review in the ultrasound unit.   Impression: Technically successful ultrasound guided injection.

## 2016-05-05 NOTE — Patient Instructions (Signed)
Thank you for coming in today. You should hear from Heme Onc.  Please let me know if there are issues.  Call or go to the ER if you develop a large red swollen joint with extreme pain or oozing puss.  Return as needed.

## 2016-05-09 ENCOUNTER — Ambulatory Visit: Payer: 59

## 2016-05-09 ENCOUNTER — Encounter: Payer: Self-pay | Admitting: Hematology & Oncology

## 2016-05-09 ENCOUNTER — Ambulatory Visit (HOSPITAL_BASED_OUTPATIENT_CLINIC_OR_DEPARTMENT_OTHER): Payer: 59 | Admitting: Hematology & Oncology

## 2016-05-09 ENCOUNTER — Other Ambulatory Visit (HOSPITAL_BASED_OUTPATIENT_CLINIC_OR_DEPARTMENT_OTHER): Payer: 59

## 2016-05-09 VITALS — BP 111/77 | HR 84 | Temp 98.3°F | Resp 20 | Wt 190.0 lb

## 2016-05-09 DIAGNOSIS — K909 Intestinal malabsorption, unspecified: Secondary | ICD-10-CM

## 2016-05-09 DIAGNOSIS — Z9884 Bariatric surgery status: Secondary | ICD-10-CM | POA: Diagnosis not present

## 2016-05-09 DIAGNOSIS — D509 Iron deficiency anemia, unspecified: Secondary | ICD-10-CM

## 2016-05-09 DIAGNOSIS — D508 Other iron deficiency anemias: Secondary | ICD-10-CM

## 2016-05-09 DIAGNOSIS — D510 Vitamin B12 deficiency anemia due to intrinsic factor deficiency: Secondary | ICD-10-CM

## 2016-05-09 HISTORY — DX: Intestinal malabsorption, unspecified: K90.9

## 2016-05-09 HISTORY — DX: Bariatric surgery status: Z98.84

## 2016-05-09 LAB — CBC WITH DIFFERENTIAL (CANCER CENTER ONLY)
BASO#: 0 10*3/uL (ref 0.0–0.2)
BASO%: 0.7 % (ref 0.0–2.0)
EOS ABS: 0.1 10*3/uL (ref 0.0–0.5)
EOS%: 1.8 % (ref 0.0–7.0)
HEMATOCRIT: 33.6 % — AB (ref 34.8–46.6)
HEMOGLOBIN: 11.3 g/dL — AB (ref 11.6–15.9)
LYMPH#: 2.4 10*3/uL (ref 0.9–3.3)
LYMPH%: 44 % (ref 14.0–48.0)
MCH: 26.3 pg (ref 26.0–34.0)
MCHC: 33.6 g/dL (ref 32.0–36.0)
MCV: 78 fL — AB (ref 81–101)
MONO#: 0.4 10*3/uL (ref 0.1–0.9)
MONO%: 6.4 % (ref 0.0–13.0)
NEUT%: 47.1 % (ref 39.6–80.0)
NEUTROS ABS: 2.6 10*3/uL (ref 1.5–6.5)
Platelets: 359 10*3/uL (ref 145–400)
RBC: 4.3 10*6/uL (ref 3.70–5.32)
RDW: 17.6 % — ABNORMAL HIGH (ref 11.1–15.7)
WBC: 5.5 10*3/uL (ref 3.9–10.0)

## 2016-05-09 LAB — COMPREHENSIVE METABOLIC PANEL
ALT: 28 U/L (ref 0–55)
AST: 34 U/L (ref 5–34)
Albumin: 3.7 g/dL (ref 3.5–5.0)
Alkaline Phosphatase: 121 U/L (ref 40–150)
Anion Gap: 12 mEq/L — ABNORMAL HIGH (ref 3–11)
BUN: 12.2 mg/dL (ref 7.0–26.0)
CALCIUM: 9.1 mg/dL (ref 8.4–10.4)
CHLORIDE: 106 meq/L (ref 98–109)
CO2: 20 meq/L — AB (ref 22–29)
CREATININE: 0.9 mg/dL (ref 0.6–1.1)
EGFR: 77 mL/min/{1.73_m2} — ABNORMAL LOW (ref >90)
Glucose: 126 mg/dl (ref 70–140)
Potassium: 3.6 mEq/L (ref 3.5–5.1)
Sodium: 138 mEq/L (ref 136–145)
Total Bilirubin: 0.47 mg/dL (ref 0.20–1.20)
Total Protein: 7.2 g/dL (ref 6.4–8.3)

## 2016-05-09 LAB — LACTATE DEHYDROGENASE: LDH: 158 U/L (ref 125–245)

## 2016-05-09 LAB — CHCC SATELLITE - SMEAR

## 2016-05-09 NOTE — Progress Notes (Signed)
Referral MD  Reason for Referral: Iron deficiency anemia secondary to iron malabsorption from gastric bypass   Chief Complaint  Patient presents with  . Initial Assessment    Iron Deficiency  : My iron is low.  HPI: [name] is a very charming 50 year old white female. She I see works for the [employer] system. She is in patient accounting.  She has had problems with iron deficiency. She actually had a gastric bypass back in 2011. This was at [facility]. At the time, she weighed about 290 pounds.  She does not have her monthly cycles. She had a hysterectomy back in 1997. This was for pre-cervical cancer. Her ovaries were removed.  She has had problems with iron deficiency. She is on a lot of oral iron. She is on a very high-dose of oral iron but this obviously is not working.  She had iron studies done about 10 days ago. This showed an iron saturation of 5%. Her ferritin was 5. A month prior to this, her ferritin was 6 with an iron saturation of 5%.  She had a CBC done 2 weeks ago. Her hemoglobin was 11.3. Her MCV was 81. A month prior to that, her hemoglobin was 11.4. Her MCV was 79.  She has had multiple surgeries in the past. She has had her gallbladder taken out. Her appendix has been taken out.  She does get tired pretty easily. She does not have a lot of stamina. She is not a vegetarian. She's had no change in bowel or bladder habits.  Her mammograms are up-to-date.  She has a tingling in the hands or feet.  There is no issues in her family with respect to blood.  She is borderline diabetic.  She does not smoke. She does not drink.  She does chew ice quite a bit.  Overall, her performance status is ECOG 0.    Past Medical History:  Diagnosis Date  . Anemia   . Anemia, iron deficiency 05/02/2015  . Fibromyalgia   . History of gastric bypass 05/09/2016  . Hypertension   . Malabsorption of iron 05/09/2016  . PONV (postoperative nausea and vomiting)   .  Surgical menopause 03/24/2016  :  Past Surgical History:  Procedure Laterality Date  . ABDOMINAL HYSTERECTOMY  1997  . APPENDECTOMY    . CESAREAN SECTION  [id number]  . CHOLECYSTECTOMY    . GASTRIC BYPASS  2011  . KNEE ARTHROSCOPY  2002  . TONSILLECTOMY  1977  . ULNAR NERVE TRANSPOSITION Right 06/12/2015   Procedure: RIGHT CUBITAL TUNNEL RELEASE;  Surgeon: Milly Jakob, MD;  Location: [place];  Service: Orthopedics;  Laterality: Right;  :   Current Outpatient Prescriptions:  .  AMBULATORY NON FORMULARY MEDICATION, One Touch Glucometer per insurance formulary. One month of testing strips, and lancets used to check blood sugar daily., Disp: 1 Units, Rfl: prn .  escitalopram (LEXAPRO) 10 MG tablet, TAKE 1 TABLET (10 MG TOTAL) BY MOUTH DAILY., Disp: 90 tablet, Rfl: 3 .  ferrous sulfate 325 (65 FE) MG EC tablet, Take 1 tablet (325 mg total) by mouth 2 (two) times daily with a meal. (Patient taking differently: Take 650 mg by mouth 2 (two) times daily. ), Disp: , Rfl: 3 .  lisinopril-hydrochlorothiazide (PRINZIDE,ZESTORETIC) 20-12.5 MG tablet, Take 1 tablet by mouth daily., Disp: 90 tablet, Rfl: 2:  :  Allergies  Allergen Reactions  . Mobic [Meloxicam] Swelling  . Shellfish Allergy Nausea Only and Swelling  . Percocet [Oxycodone-Acetaminophen]  Itching  . Lyrica [Pregabalin] Other (See Comments)    "zombie"  :  Family History  Problem Relation Age of Onset  . Diabetes Father   . Hypertension Father   . Stroke Father   . Heart failure Father   :  Social History   Social History  . Marital status: Married    Spouse name: N/A  . Number of children: N/A  . Years of education: N/A   Occupational History  . Not on file.   Social History Main Topics  . Smoking status: Never Smoker  . Smokeless tobacco: Never Used  . Alcohol use No  . Drug use: No  . Sexual activity: No   Other Topics Concern  . Not on file   Social History Narrative  . No narrative  on file  :  Pertinent items are noted in HPI.  Exam: @IPVITALS @ Well-developed and well-nourished white female in no obvious distress. Vital signs show a temperature of 98.3. Pulse 84. Blood pressure 111/77. Weight is 190 pounds. Head and neck exam shows no ocular or oral lesions. There are no palpable cervical or supraclavicular lymph nodes. Lungs are clear bilaterally. Cardiac exam regular rate and rhythm with no murmurs, rubs or bruits. Abdomen is soft. She has well-healed laparoscopy scars. There is no guarding or rebound tenderness. She has no fluid wave. There is no palpable liver or spleen tip. Back exam shows no tenderness over the spine, ribs or hips. Extremities shows no clubbing, cyanosis or edema. Skin exam shows no rashes, ecchymoses or petechia. Neurological exam shows no focal neurological deficits.    Recent Labs  05/09/16 1332  WBC 5.5  HGB 11.3*  HCT 33.6*  PLT 359   No results for input(s): NA, K, CL, CO2, GLUCOSE, BUN, CREATININE, CALCIUM in the last 72 hours.  Blood smear review:  Mild anisocytosis and poikilocytosis. She has no nucleated red blood cells. There are no target cells. She has some microcytic red blood cells. She has some Oestmann cells. I see no hypersegmented polys. She has no immature myeloid or lymphoid forms. Platelets are adequate in number and size.  Pathology: None      Assessment and Plan:  [name] is a very charming 50 year old white female. She clearly has iron deficiency anemia. She is iron deficient because of the gastric bypass. I just do not think she is going to absorb oral iron. She is on a lot of oral iron right now and this clearly is not helping.  We'll go ahead and give her IV iron. This will be oh to help. This will help with her symptoms.  We will have to watch out for vitamin B-12 deficiency at some point. Typically, after gastric bypass, B-12 deficiency starts in about 7 years.  I think that with IV iron, this will definitely  help her symptoms. She will feel better. Hopefully we can get her feeling better for Christmas.  She has a very interesting hobby. She rides motorcycles. Several of her family members also rides motorcycles. She has a Selinda Eon.  I spent about 45 minutes with her. She is quite knowledgeable. She is very eloquent. She asked quite a lot of good questions.  We will give her iron in the next week. She will get a second dose the week afterwards.  I will see her back in about 6 weeks.

## 2016-05-10 LAB — RETICULOCYTES: Reticulocyte Count: 0.8 % (ref 0.6–2.6)

## 2016-05-12 LAB — IRON AND TIBC
%SAT: 6 % — ABNORMAL LOW (ref 21–57)
IRON: 28 ug/dL — AB (ref 41–142)
TIBC: 474 ug/dL — AB (ref 236–444)
UIBC: 446 ug/dL — ABNORMAL HIGH (ref 120–384)

## 2016-05-12 LAB — FERRITIN: Ferritin: 8 ng/ml — ABNORMAL LOW (ref 9–269)

## 2016-05-12 LAB — HEMOGLOBINOPATHY EVALUATION
HEMOGLOBIN F QUANTITATION: 0 % (ref 0.0–2.0)
HGB A: 97.9 % (ref 94.0–98.0)
HGB C: 0 %
HGB S: 0 %
Hemoglobin A2 Quantitation: 2.1 % (ref 0.7–3.1)

## 2016-05-14 ENCOUNTER — Other Ambulatory Visit: Payer: 59

## 2016-05-14 ENCOUNTER — Ambulatory Visit: Payer: 59

## 2016-05-15 ENCOUNTER — Ambulatory Visit (HOSPITAL_BASED_OUTPATIENT_CLINIC_OR_DEPARTMENT_OTHER): Payer: 59

## 2016-05-15 VITALS — BP 104/68 | HR 72 | Temp 97.9°F | Resp 18

## 2016-05-15 DIAGNOSIS — D509 Iron deficiency anemia, unspecified: Secondary | ICD-10-CM

## 2016-05-15 DIAGNOSIS — D508 Other iron deficiency anemias: Secondary | ICD-10-CM

## 2016-05-15 MED ORDER — SODIUM CHLORIDE 0.9 % IV SOLN
Freq: Once | INTRAVENOUS | Status: AC
  Administered 2016-05-15: 12:00:00 via INTRAVENOUS

## 2016-05-15 MED ORDER — FERUMOXYTOL INJECTION 510 MG/17 ML
510.0000 mg | Freq: Once | INTRAVENOUS | Status: AC
  Administered 2016-05-15: 510 mg via INTRAVENOUS
  Filled 2016-05-15: qty 17

## 2016-05-15 NOTE — Patient Instructions (Signed)
Ferumoxytol injection What is this medicine? FERUMOXYTOL is an iron complex. Iron is used to make healthy red blood cells, which carry oxygen and nutrients throughout the body. This medicine is used to treat iron deficiency anemia in people with chronic kidney disease. COMMON BRAND NAME(S): Feraheme What should I tell my health care provider before I take this medicine? They need to know if you have any of these conditions: -anemia not caused by low iron levels -high levels of iron in the blood -magnetic resonance imaging (MRI) test scheduled -an unusual or allergic reaction to iron, other medicines, foods, dyes, or preservatives -pregnant or trying to get pregnant -breast-feeding How should I use this medicine? This medicine is for injection into a vein. It is given by a health care professional in a hospital or clinic setting. Talk to your pediatrician regarding the use of this medicine in children. Special care may be needed. What if I miss a dose? It is important not to miss your dose. Call your doctor or health care professional if you are unable to keep an appointment. What may interact with this medicine? This medicine may interact with the following medications: -other iron products What should I watch for while using this medicine? Visit your doctor or healthcare professional regularly. Tell your doctor or healthcare professional if your symptoms do not start to get better or if they get worse. You may need blood work done while you are taking this medicine. You may need to follow a special diet. Talk to your doctor. Foods that contain iron include: whole grains/cereals, dried fruits, beans, or peas, leafy green vegetables, and organ meats (liver, kidney). What side effects may I notice from receiving this medicine? Side effects that you should report to your doctor or health care professional as soon as possible: -allergic reactions like skin rash, itching or hives, swelling of the  face, lips, or tongue -breathing problems -changes in blood pressure -feeling faint or lightheaded, falls -fever or chills -flushing, sweating, or hot feelings -swelling of the ankles or feet Side effects that usually do not require medical attention (report to your doctor or health care professional if they continue or are bothersome): -diarrhea -headache -nausea, vomiting -stomach pain Where should I keep my medicine? This drug is given in a hospital or clinic and will not be stored at home.  2017 Elsevier/Gold Standard (2015-06-28 12:41:49)  

## 2016-05-16 ENCOUNTER — Ambulatory Visit (INDEPENDENT_AMBULATORY_CARE_PROVIDER_SITE_OTHER): Payer: 59

## 2016-05-16 DIAGNOSIS — E8940 Asymptomatic postprocedural ovarian failure: Secondary | ICD-10-CM

## 2016-05-16 DIAGNOSIS — Z0000 Encounter for general adult medical examination without abnormal findings: Secondary | ICD-10-CM

## 2016-05-16 DIAGNOSIS — Z1231 Encounter for screening mammogram for malignant neoplasm of breast: Secondary | ICD-10-CM

## 2016-05-16 DIAGNOSIS — M85852 Other specified disorders of bone density and structure, left thigh: Secondary | ICD-10-CM

## 2016-05-16 DIAGNOSIS — M8588 Other specified disorders of bone density and structure, other site: Secondary | ICD-10-CM | POA: Diagnosis not present

## 2016-05-16 DIAGNOSIS — Z1239 Encounter for other screening for malignant neoplasm of breast: Secondary | ICD-10-CM

## 2016-05-20 ENCOUNTER — Other Ambulatory Visit: Payer: 59

## 2016-05-20 ENCOUNTER — Encounter: Payer: Self-pay | Admitting: Family Medicine

## 2016-05-23 ENCOUNTER — Ambulatory Visit (HOSPITAL_BASED_OUTPATIENT_CLINIC_OR_DEPARTMENT_OTHER): Payer: 59

## 2016-05-23 VITALS — BP 110/65 | HR 72 | Temp 98.6°F | Resp 20

## 2016-05-23 DIAGNOSIS — D509 Iron deficiency anemia, unspecified: Secondary | ICD-10-CM

## 2016-05-23 DIAGNOSIS — D508 Other iron deficiency anemias: Secondary | ICD-10-CM

## 2016-05-23 MED ORDER — SODIUM CHLORIDE 0.9 % IV SOLN
Freq: Once | INTRAVENOUS | Status: AC
  Administered 2016-05-23: 14:00:00 via INTRAVENOUS

## 2016-05-23 MED ORDER — SODIUM CHLORIDE 0.9 % IV SOLN
510.0000 mg | Freq: Once | INTRAVENOUS | Status: AC
  Administered 2016-05-23: 510 mg via INTRAVENOUS
  Filled 2016-05-23: qty 17

## 2016-05-23 NOTE — Patient Instructions (Signed)
Ferumoxytol injection What is this medicine? FERUMOXYTOL is an iron complex. Iron is used to make healthy red blood cells, which carry oxygen and nutrients throughout the body. This medicine is used to treat iron deficiency anemia in people with chronic kidney disease. COMMON BRAND NAME(S): Feraheme What should I tell my health care provider before I take this medicine? They need to know if you have any of these conditions: -anemia not caused by low iron levels -high levels of iron in the blood -magnetic resonance imaging (MRI) test scheduled -an unusual or allergic reaction to iron, other medicines, foods, dyes, or preservatives -pregnant or trying to get pregnant -breast-feeding How should I use this medicine? This medicine is for injection into a vein. It is given by a health care professional in a hospital or clinic setting. Talk to your pediatrician regarding the use of this medicine in children. Special care may be needed. What if I miss a dose? It is important not to miss your dose. Call your doctor or health care professional if you are unable to keep an appointment. What may interact with this medicine? This medicine may interact with the following medications: -other iron products What should I watch for while using this medicine? Visit your doctor or healthcare professional regularly. Tell your doctor or healthcare professional if your symptoms do not start to get better or if they get worse. You may need blood work done while you are taking this medicine. You may need to follow a special diet. Talk to your doctor. Foods that contain iron include: whole grains/cereals, dried fruits, beans, or peas, leafy green vegetables, and organ meats (liver, kidney). What side effects may I notice from receiving this medicine? Side effects that you should report to your doctor or health care professional as soon as possible: -allergic reactions like skin rash, itching or hives, swelling of the  face, lips, or tongue -breathing problems -changes in blood pressure -feeling faint or lightheaded, falls -fever or chills -flushing, sweating, or hot feelings -swelling of the ankles or feet Side effects that usually do not require medical attention (report to your doctor or health care professional if they continue or are bothersome): -diarrhea -headache -nausea, vomiting -stomach pain Where should I keep my medicine? This drug is given in a hospital or clinic and will not be stored at home.  2017 Elsevier/Gold Standard (2015-06-28 12:41:49)  

## 2016-05-26 ENCOUNTER — Encounter: Payer: Self-pay | Admitting: *Deleted

## 2016-05-26 ENCOUNTER — Emergency Department
Admission: EM | Admit: 2016-05-26 | Discharge: 2016-05-26 | Disposition: A | Discharge status: Home / Self Care | Payer: 59 | Source: Home / Self Care | Attending: Family Medicine | Admitting: Family Medicine

## 2016-05-26 ENCOUNTER — Emergency Department (INDEPENDENT_AMBULATORY_CARE_PROVIDER_SITE_OTHER): Payer: 59

## 2016-05-26 DIAGNOSIS — S63502A Unspecified sprain of left wrist, initial encounter: Secondary | ICD-10-CM

## 2016-05-26 DIAGNOSIS — M25462 Effusion, left knee: Secondary | ICD-10-CM | POA: Diagnosis not present

## 2016-05-26 DIAGNOSIS — S8992XA Unspecified injury of left lower leg, initial encounter: Secondary | ICD-10-CM | POA: Diagnosis not present

## 2016-05-26 DIAGNOSIS — M25531 Pain in right wrist: Secondary | ICD-10-CM | POA: Diagnosis not present

## 2016-05-26 DIAGNOSIS — M25532 Pain in left wrist: Secondary | ICD-10-CM | POA: Diagnosis not present

## 2016-05-26 DIAGNOSIS — S8002XA Contusion of left knee, initial encounter: Secondary | ICD-10-CM | POA: Diagnosis not present

## 2016-05-26 LAB — POCT CBC W AUTO DIFF (K'VILLE URGENT CARE)

## 2016-05-26 NOTE — ED Triage Notes (Signed)
Pt c/o LT wrist, hand, shoulder and knee pain post fall at 11:30am today. She does recall the fall. She was in a restaurant reached for her purse then recalls waking up on the floor. She reports hemoglobin of 5.0 at the beginning of November. She has had 2 iron infusion, most recently on 05/23/16.

## 2016-05-26 NOTE — Discharge Instructions (Signed)
Wear ace wrap on left knee until swelling resolves.  Wear left wrist splint for about one week until improved.  Begin wrist range of motion and stretching exercises as tolerated.  Apply ice pack for 15 to 20 minutes, 3 to 4 times daily  Continue until pain and swelling decrease.  May take Tylenol as needed for pain.

## 2016-05-26 NOTE — ED Provider Notes (Signed)
[facility]    CSN: [id number] Arrival date & time: 05/26/16  1141     History   Chief Complaint Chief Complaint  Patient presents with  . Fall    HPI Stacy Woods is a 50 y.o. female.   Patient reports that she was in a restaurant about 45 minutes ago.  She states that she reached for her purse, and then awakened on the floor but does not remember falling.  She injured her left hand, wrist, and left knee.  She has mild soreness in her left shoulder.  She denies light-headedness or dizziness at present.  No chest pain or shortness of breath. She has a history of iron deficiency anemia, receiving Feraheme infusions (last infusion 05/23/16) She also has a history of bilateral knee osteoarthritis, and has been receiving Orthovisc knee injections by Dr. Georgina Snell (she has a follow-up visit with Dr. Georgina Snell on January 4).   The history is provided by the patient.    Past Medical History:  Diagnosis Date  . Anemia   . Anemia, iron deficiency 05/02/2015  . Fibromyalgia   . History of gastric bypass 05/09/2016  . Hypertension   . Malabsorption of iron 05/09/2016  . PONV (postoperative nausea and vomiting)   . Surgical menopause 03/24/2016    Patient Active Problem List   Diagnosis Date Noted  . Malabsorption of iron 05/09/2016  . History of gastric bypass 05/09/2016  . Arthritis of knee, degenerative 04/14/2016  . Surgical menopause 03/24/2016  . Family history of breast cancer 03/24/2016  . Hypoglycemia 06/12/2015  . Iron deficiency anemia 05/02/2015  . Cubital tunnel syndrome on right 09/14/2014  . Degenerative disc disease, cervical 07/27/2014  . Prediabetes 09/01/2013  . Fibromyalgia 04/11/2013  . Essential hypertension, benign 04/11/2013  . Anxiety state 04/11/2013    Past Surgical History:  Procedure Laterality Date  . ABDOMINAL HYSTERECTOMY  1997  . APPENDECTOMY    . CESAREAN SECTION  [id number]  . CHOLECYSTECTOMY    . GASTRIC BYPASS  2011  . KNEE  ARTHROSCOPY  2002  . TONSILLECTOMY  1977  . ULNAR NERVE TRANSPOSITION Right 06/12/2015   Procedure: RIGHT CUBITAL TUNNEL RELEASE;  Surgeon: Milly Jakob, MD;  Location: [place];  Service: Orthopedics;  Laterality: Right;    OB History    No data available       Home Medications    Prior to Admission medications   Medication Sig Start Date End Date Taking? Authorizing Provider  escitalopram (LEXAPRO) 10 MG tablet TAKE 1 TABLET (10 MG TOTAL) BY MOUTH DAILY. 02/26/16  Yes Silverio Decamp, MD  lisinopril-hydrochlorothiazide (PRINZIDE,ZESTORETIC) 20-12.5 MG tablet Take 1 tablet by mouth daily. 05/01/15  Yes Sean Hommel, DO  [place] One Touch Glucometer per insurance formulary. One month of testing strips, and lancets used to check blood sugar daily. 06/12/15   Sean Hommel, DO  ferrous sulfate 325 (65 FE) MG EC tablet Take 1 tablet (325 mg total) by mouth 2 (two) times daily with a meal. Patient taking differently: Take 650 mg by mouth 2 (two) times daily.  05/02/15   Marcial Pacas, DO    Family History Family History  Problem Relation Age of Onset  . Diabetes Father   . Hypertension Father   . Stroke Father   . Heart failure Father     Social History Social History  Substance Use Topics  . Smoking status: Never Smoker  . Smokeless tobacco: Never Used  . Alcohol  use No     Allergies   Mobic [meloxicam]; Shellfish allergy; Percocet [oxycodone-acetaminophen]; and Lyrica [pregabalin]   Review of Systems Review of Systems  Constitutional: Negative for activity change, appetite change, chills, diaphoresis, fatigue and fever.  HENT: Negative.   Eyes: Negative.   Respiratory: Negative.   Cardiovascular: Negative.   Gastrointestinal: Negative.   Genitourinary: Negative.   Musculoskeletal:       Left hand/wrist/knee pain  Skin: Negative.   Neurological: Positive for syncope. Negative for dizziness, tremors, seizures, speech  difficulty, weakness, light-headedness and headaches.     Physical Exam Triage Vital Signs ED Triage Vitals  Enc Vitals Group     BP 05/26/16 1202 120/84     Pulse Rate 05/26/16 1202 72     Resp 05/26/16 1202 16     Temp 05/26/16 1202 97.7 F (36.5 C)     Temp Source 05/26/16 1202 Oral     SpO2 05/26/16 1202 97 %     Weight 05/26/16 1203 190 lb (86.2 kg)     Height 05/26/16 1203 5\' 4"  (1.626 m)     Head Circumference --      Peak Flow --      Pain Score 05/26/16 1205 6     Pain Loc --      Pain Edu? --      Excl. in [place]? --    No data found.   Updated Vital Signs BP 120/84 (BP Location: Left Arm)   Pulse 72   Temp 97.7 F (36.5 C) (Oral)   Resp 16   Ht 5\' 4"  (1.626 m)   Wt 190 lb (86.2 kg)   SpO2 97%   BMI 32.61 kg/m   Visual Acuity Right Eye Distance:   Left Eye Distance:   Bilateral Distance:    Right Eye Near:   Left Eye Near:    Bilateral Near:     Physical Exam  Constitutional: She is oriented to person, place, and time. She appears well-developed and well-nourished. No distress.  HENT:  Head: Normocephalic and atraumatic.  Right Ear: External ear normal.  Left Ear: External ear normal.  Nose: Nose normal.  Mouth/Throat: Oropharynx is clear and moist.  Eyes: Conjunctivae and EOM are normal. Pupils are equal, round, and reactive to light.  Neck: Normal range of motion.  Cardiovascular: Normal heart sounds.   Pulmonary/Chest: Breath sounds normal.  Abdominal: There is no tenderness.  Musculoskeletal: She exhibits no edema.       Left shoulder: Normal.       Left knee: She exhibits normal range of motion, no swelling, no effusion, no ecchymosis, no deformity and no laceration. Tenderness found.       Hands:      Legs: Left knee:  No effusion, erythema, or warmth.  Knee stable, negative drawer test.  McMurray test negative.  There is mild tenderness to palpation anteriorly over superior tibia beneath patella.  Left wrist has decreased range of  motion.  There is tenderness to palpation dorsally and over anatomic snuffbox as noted on diagram.   Neurological: She is alert and oriented to person, place, and time. No cranial nerve deficit. Coordination normal.  Skin: Skin is warm and dry.  Nursing note and vitals reviewed.    UC Treatments / Results  Labs (all labs ordered are listed, but only abnormal results are displayed) Labs Reviewed  POCT CBC W AUTO DIFF ([facility]):  WBC 5.7; LY 44.7; MO 7.2; GR 48.1; Hgb 12.1; Platelets  344     EKG  EKG Interpretation None       Radiology Dg Wrist Complete Left  Result Date: 05/26/2016 CLINICAL DATA:  Lateral wrist pain following fall, initial encounter EXAM: LEFT WRIST - COMPLETE 3+ VIEW COMPARISON:  None. FINDINGS: There is no evidence of fracture or dislocation. There is no evidence of arthropathy or other focal bone abnormality. Soft tissues are unremarkable. IMPRESSION: No acute abnormality. Electronically Signed   By: Inez Catalina M.D.   On: 05/26/2016 12:50   Dg Knee Complete 4 Views Left  Result Date: 05/26/2016 CLINICAL DATA:  Fall. EXAM: LEFT KNEE - COMPLETE 4+ VIEW COMPARISON:  No recent prior . FINDINGS: Mild tricompartment degenerative change. No acute bony or joint abnormality identified. No evidence of fracture or dislocation. Small knee joint effusion. IMPRESSION: Mild tricompartment degenerative change. No acute abnormality identified. Small knee joint effusion. Electronically Signed   By: Marcello Moores  Register   On: 05/26/2016 12:47    Procedures Procedures (including critical care time)  Medications Ordered in UC Medications - No data to display   Initial Impression / Assessment and Plan / UC Course  I have reviewed the triage vital signs and the nursing notes.  Pertinent labs & imaging results that were available during my care of the patient were reviewed by me and considered in my medical decision making (see chart for details).  Clinical Course     With her history of iron deficiency anemia, a normal Hgb today (12.1) is reassuring. Wrist splint applied.  Ace wrap applied to left knee Wear ace wrap on left knee until swelling resolves.  Wear left wrist splint for about one week until improved.  Begin wrist range of motion and stretching exercises as tolerated.  Apply ice pack for 15 to 20 minutes, 3 to 4 times daily  Continue until pain and swelling decrease.  May take Tylenol as needed for pain. Followup with Dr. Georgina Snell as scheduled on June 12, 2016    Final Clinical Impressions(s) / UC Diagnoses   Final diagnoses:  Contusion of left knee, initial encounter  Sprain of left wrist, initial encounter    New Prescriptions New Prescriptions   No medications on file     Kandra Nicolas, MD 05/26/16 1352

## 2016-06-09 DIAGNOSIS — R569 Unspecified convulsions: Secondary | ICD-10-CM

## 2016-06-09 HISTORY — DX: Unspecified convulsions: R56.9

## 2016-06-12 ENCOUNTER — Ambulatory Visit (INDEPENDENT_AMBULATORY_CARE_PROVIDER_SITE_OTHER): Payer: 59 | Admitting: Family Medicine

## 2016-06-12 ENCOUNTER — Other Ambulatory Visit (HOSPITAL_BASED_OUTPATIENT_CLINIC_OR_DEPARTMENT_OTHER): Payer: 59

## 2016-06-12 ENCOUNTER — Encounter: Payer: Self-pay | Admitting: Family Medicine

## 2016-06-12 ENCOUNTER — Ambulatory Visit (HOSPITAL_BASED_OUTPATIENT_CLINIC_OR_DEPARTMENT_OTHER): Payer: 59 | Admitting: Hematology & Oncology

## 2016-06-12 VITALS — BP 97/54 | HR 67 | Wt 190.0 lb

## 2016-06-12 VITALS — BP 102/70 | HR 69 | Temp 98.3°F | Wt 190.4 lb

## 2016-06-12 DIAGNOSIS — D508 Other iron deficiency anemias: Secondary | ICD-10-CM | POA: Diagnosis not present

## 2016-06-12 DIAGNOSIS — D510 Vitamin B12 deficiency anemia due to intrinsic factor deficiency: Secondary | ICD-10-CM | POA: Diagnosis not present

## 2016-06-12 DIAGNOSIS — D509 Iron deficiency anemia, unspecified: Secondary | ICD-10-CM | POA: Diagnosis not present

## 2016-06-12 DIAGNOSIS — K909 Intestinal malabsorption, unspecified: Secondary | ICD-10-CM

## 2016-06-12 DIAGNOSIS — K9089 Other intestinal malabsorption: Secondary | ICD-10-CM

## 2016-06-12 DIAGNOSIS — Z9884 Bariatric surgery status: Secondary | ICD-10-CM | POA: Diagnosis not present

## 2016-06-12 DIAGNOSIS — M25562 Pain in left knee: Secondary | ICD-10-CM

## 2016-06-12 DIAGNOSIS — R55 Syncope and collapse: Secondary | ICD-10-CM | POA: Diagnosis not present

## 2016-06-12 LAB — CBC WITH DIFFERENTIAL (CANCER CENTER ONLY)
BASO#: 0 10*3/uL (ref 0.0–0.2)
BASO%: 0.7 % (ref 0.0–2.0)
EOS%: 1.8 % (ref 0.0–7.0)
Eosinophils Absolute: 0.1 10*3/uL (ref 0.0–0.5)
HEMATOCRIT: 40.5 % (ref 34.8–46.6)
HEMOGLOBIN: 13.7 g/dL (ref 11.6–15.9)
LYMPH#: 2 10*3/uL (ref 0.9–3.3)
LYMPH%: 35.5 % (ref 14.0–48.0)
MCH: 28.4 pg (ref 26.0–34.0)
MCHC: 33.8 g/dL (ref 32.0–36.0)
MCV: 84 fL (ref 81–101)
MONO#: 0.5 10*3/uL (ref 0.1–0.9)
MONO%: 9 % (ref 0.0–13.0)
NEUT%: 53 % (ref 39.6–80.0)
NEUTROS ABS: 2.9 10*3/uL (ref 1.5–6.5)
Platelets: 333 10*3/uL (ref 145–400)
RBC: 4.82 10*6/uL (ref 3.70–5.32)
RDW: 20.3 % — AB (ref 11.1–15.7)
WBC: 5.6 10*3/uL (ref 3.9–10.0)

## 2016-06-12 LAB — IRON AND TIBC
%SAT: 30 % (ref 21–57)
IRON: 106 ug/dL (ref 41–142)
TIBC: 350 ug/dL (ref 236–444)
UIBC: 244 ug/dL (ref 120–384)

## 2016-06-12 LAB — FERRITIN: Ferritin: 365 ng/ml — ABNORMAL HIGH (ref 9–269)

## 2016-06-12 NOTE — Patient Instructions (Signed)
Thank you for coming in today. Cut blood pressure medicine in half.  Return for recheck in 1-3 months.  Let me know what the blood pressures are.   Attend PT for your leg.   Continue calcium and VitD for your bone health.

## 2016-06-12 NOTE — Progress Notes (Signed)
Hematology and Oncology Follow Up Visit  Stacy Woods [id number] June 23, 1965 51 y.o. 06/12/2016   Principle Diagnosis:   Iron deficiency anemia-malabsorption secondary to gastric bypass  Current Therapy:    IV iron as indicated - last dose given on 05/23/2069     Interim History:  Stacy Woods is back for follow-up. We first saw her back in early December. At that point time, her iron studies were incredibly low. Her ferritin was only 8. Her iron saturation was 6%. We went ahead and gave her 2 doses of IV iron. This made her feel better.  She's been having some problems since we saw her. She did have and some presyncope-type episodes. She was in a restaurant one time. X-rays she knew, she was on the floor. She saw her family doctor earlier today. I'll have back any labs on her.  She is still working at [employer]. She is quite busy with the new year.  She's had no issues with bleeding. She's had no issues with fever. His been no change in bowel or bladder habits.  She's had no chest pain. She's had no palpitations. She's had no rashes. She's had no change in medications. Her blood pressure medicine is being decreased.  Overall, her performance status is ECOG 0.  Medications:  Current Outpatient Prescriptions:  .  AMBULATORY NON FORMULARY MEDICATION, One Touch Glucometer per insurance formulary. One month of testing strips, and lancets used to check blood sugar daily., Disp: 1 Units, Rfl: prn .  escitalopram (LEXAPRO) 10 MG tablet, TAKE 1 TABLET (10 MG TOTAL) BY MOUTH DAILY., Disp: 90 tablet, Rfl: 3 .  ferrous sulfate 325 (65 FE) MG EC tablet, Take 1 tablet (325 mg total) by mouth 2 (two) times daily with a meal. (Patient taking differently: Take 650 mg by mouth 2 (two) times daily. ), Disp: , Rfl: 3 .  lisinopril-hydrochlorothiazide (PRINZIDE,ZESTORETIC) 20-12.5 MG tablet, Take 1 tablet by mouth daily. (Patient taking differently: Take 0.5 tablets by mouth daily. ), Disp: 90 tablet, Rfl:  2  Allergies:  Allergies  Allergen Reactions  . Mobic [Meloxicam] Swelling  . Shellfish Allergy Nausea Only and Swelling  . Percocet [Oxycodone-Acetaminophen] Itching  . Lyrica [Pregabalin] Other (See Comments)    "zombie"    Past Medical History, Surgical history, Social history, and Family History were reviewed and updated.  Review of Systems: As above  Physical Exam:  weight is 190 lb 6.4 oz (86.4 kg). Her oral temperature is 98.3 F (36.8 C). Her blood pressure is 102/70 and her pulse is 69.   Wt Readings from Last 3 Encounters:  06/12/16 190 lb 6.4 oz (86.4 kg)  06/12/16 190 lb (86.2 kg)  05/26/16 190 lb (86.2 kg)     Head and neck exam shows no ocular or oral lesions. There are no palpable cervical or supraclavicular lymph nodes. Lungs are clear bilaterally. Cardiac exam regular rate and rhythm with no murmurs, rubs or bruits. Abdomen is soft. She has well-healed laparoscopy scars. There is no guarding or rebound tenderness. She has no fluid wave. There is no palpable liver or spleen tip. Back exam shows no tenderness over the spine, ribs or hips. Extremities shows no clubbing, cyanosis or edema. Skin exam shows no rashes, ecchymoses or petechia. Neurological exam shows no focal neurological deficits.   Lab Results  Component Value Date   WBC 5.6 06/12/2016   HGB 13.7 06/12/2016   HCT 40.5 06/12/2016   MCV 84 06/12/2016   PLT 333 06/12/2016  Chemistry      Component Value Date/Time   NA 138 05/09/2016 1332   K 3.6 05/09/2016 1332   CL 108 03/24/2016 0852   CO2 20 (L) 05/09/2016 1332   BUN 12.2 05/09/2016 1332   CREATININE 0.9 05/09/2016 1332      Component Value Date/Time   CALCIUM 9.1 05/09/2016 1332   ALKPHOS 121 05/09/2016 1332   AST 34 05/09/2016 1332   ALT 28 05/09/2016 1332   BILITOT 0.47 05/09/2016 1332         Impression and Plan: Stacy Woods is a 51 year old white female. She has history of gastric bypass. She is iron deficient. Her  hemoglobin has improved nicely. Her MCV is coming up. This is all a good sign of iron replacement.  We will see what her iron studies show. It is possible her iron still might be on the lower side.  We will get her back now in 3 months. I think this is reasonable.  I'm not sure as to why she is having these near syncopal episodes. I think that she might need a Holter monitor to see if she is not having some kind of arrhythmia. I'm sure her family doctor is on top of this.   [name], MD 1/4/20181:52 PM

## 2016-06-12 NOTE — Progress Notes (Signed)
Stacy Woods is a 51 y.o. female who presents to [facility] today for left knee pain and syncope.  In mid December patient stood up from a seated position became lightheaded and fell to the ground landing on her left knee. She additionally injured her left wrist. She was seen in urgent care where x-rays were unremarkable. She has wrist pain is resolved however she continues to experience pain in her left knee. She has pain at the medial aspect of the proximal tibia and along the anterior thigh in the quadriceps area. She has pain with activity and at rest. She denies any other radiating pain weakness or numbness.  She notes that she gets dizzy when she stands up several times per week. This is the first time she's fallen or had a syncopal event. She does not think that she had her head. She denies chest pain or palpitations. She continues to take her blood pressure medications.   Past Medical History:  Diagnosis Date  . Anemia   . Anemia, iron deficiency 05/02/2015  . Fibromyalgia   . History of gastric bypass 05/09/2016  . Hypertension   . Malabsorption of iron 05/09/2016  . PONV (postoperative nausea and vomiting)   . Surgical menopause 03/24/2016   Past Surgical History:  Procedure Laterality Date  . ABDOMINAL HYSTERECTOMY  1997  . APPENDECTOMY    . CESAREAN SECTION  [id number]  . CHOLECYSTECTOMY    . GASTRIC BYPASS  2011  . KNEE ARTHROSCOPY  2002  . TONSILLECTOMY  1977  . ULNAR NERVE TRANSPOSITION Right 06/12/2015   Procedure: RIGHT CUBITAL TUNNEL RELEASE;  Surgeon: Milly Jakob, MD;  Location: [facility];  Service: Orthopedics;  Laterality: Right;   Social History  Substance Use Topics  . Smoking status: Never Smoker  . Smokeless tobacco: Never Used  . Alcohol use No   family history includes Diabetes in her father; Heart failure in her  father; Hypertension in her father; Stroke in her father.  ROS as above:  Medications: Current Outpatient Prescriptions  Medication Sig Dispense Refill  . AMBULATORY NON FORMULARY MEDICATION One Touch Glucometer per insurance formulary. One month of testing strips, and lancets used to check blood sugar daily. 1 Units prn  . escitalopram (LEXAPRO) 10 MG tablet TAKE 1 TABLET (10 MG TOTAL) BY MOUTH DAILY. 90 tablet 3  . ferrous sulfate 325 (65 FE) MG EC tablet Take 1 tablet (325 mg total) by mouth 2 (two) times daily with a meal. (Patient taking differently: Take 650 mg by mouth 2 (two) times daily. )  3  . lisinopril-hydrochlorothiazide (PRINZIDE,ZESTORETIC) 20-12.5 MG tablet Take 1 tablet by mouth daily. 90 tablet 2   No current facility-administered medications for this visit.    Allergies  Allergen Reactions  . Mobic [Meloxicam] Swelling  . Shellfish Allergy Nausea Only and Swelling  . Percocet [Oxycodone-Acetaminophen] Itching  . Lyrica [Pregabalin] Other (See Comments)    "zombie"    Health Maintenance Health Maintenance  Topic Date Due  . HIV Screening  02/26/1983  . TETANUS/TDAP  03/24/2026  . INFLUENZA VACCINE  Completed     Exam:  BP (!) 97/54   Pulse 67   Wt 190 lb (86.2 kg)   BMI 32.61 kg/m  Gen: Well NAD HEENT: EOMI,  MMM Lungs: Normal work of breathing. CTABL Heart: RRR no MRG Abd: NABS, Soft. Nondistended, Nontender Exts: Brisk capillary refill, warm and well perfused.  Left  knee unremarkable without significant effusion. Tender palpation at the proximal medial tibia. Normal knee motion. Intact extension and flexion strength however  pain with extension and flexion are present.   no edema lower extremity.  X-ray left knee dated December 2017 reviewed   No results found for this or any previous visit (from the past 80 hour(s)). No results found.    Assessment and Plan: 51 y.o. female with  Left knee pain likely contusion versus strain. Plan refer  back to physical therapy. Recheck in about a month or so or sooner if needed.  Syncope. Unclear etiology however patient is hypotensive today. I suspect this was orthostatic in nature. Plan to reduce blood pressure pill by half and recheck over the next few days at home and at work.     Orders Placed This Encounter  Procedures  . Ambulatory referral to Physical Therapy    Referral Priority:   Routine    Referral Type:   Physical Medicine    Referral Reason:   Specialty Services Required    Requested Specialty:   Physical Therapy    Number of Visits Requested:   1    Discussed warning signs or symptoms. Please see discharge instructions. Patient expresses understanding.

## 2016-06-13 ENCOUNTER — Telehealth: Payer: Self-pay | Admitting: *Deleted

## 2016-06-13 LAB — RETICULOCYTES: Reticulocyte Count: 1.3 % (ref 0.6–2.6)

## 2016-06-13 LAB — VITAMIN B12: Vitamin B12: 259 pg/mL (ref 232–1245)

## 2016-06-13 NOTE — Telephone Encounter (Addendum)
Patient aware of results  ----- Message from [name], MD sent at 06/12/2016  2:35 PM EST ----- Call - iron level is so much better!!!!  Stacy Woods

## 2016-06-18 ENCOUNTER — Ambulatory Visit: Payer: 59 | Attending: Family Medicine | Admitting: Physical Therapy

## 2016-06-18 ENCOUNTER — Encounter: Payer: Self-pay | Admitting: Family Medicine

## 2016-06-18 ENCOUNTER — Encounter: Payer: Self-pay | Admitting: Physical Therapy

## 2016-06-18 DIAGNOSIS — M25562 Pain in left knee: Secondary | ICD-10-CM | POA: Insufficient documentation

## 2016-06-18 DIAGNOSIS — R262 Difficulty in walking, not elsewhere classified: Secondary | ICD-10-CM | POA: Diagnosis not present

## 2016-06-18 NOTE — Therapy (Signed)
[address] [place], [place], [address] Phone: [phone]   Fax:  [phone]  Physical Therapy Evaluation  Patient Details  Name: Stacy Woods MRN: [id number] Date of Birth: 02-02-1966 Referring Provider: Lynne Leader  Encounter Date: 06/18/2016      PT End of Session - 06/18/16 0923    Visit Number 1   Number of Visits 24   Date for PT Re-Evaluation 08/16/16   PT Start Time 0828   PT Stop Time 0920   PT Time Calculation (min) 52 min   Activity Tolerance Patient tolerated treatment well   Behavior During Therapy [facility] for tasks assessed/performed      Past Medical History:  Diagnosis Date  . Anemia   . Anemia, iron deficiency 05/02/2015  . Fibromyalgia   . History of gastric bypass 05/09/2016  . Hypertension   . Malabsorption of iron 05/09/2016  . PONV (postoperative nausea and vomiting)   . Surgical menopause 03/24/2016    Past Surgical History:  Procedure Laterality Date  . ABDOMINAL HYSTERECTOMY  1997  . APPENDECTOMY    . CESAREAN SECTION  [id number]  . CHOLECYSTECTOMY    . GASTRIC BYPASS  2011  . KNEE ARTHROSCOPY  2002  . TONSILLECTOMY  1977  . ULNAR NERVE TRANSPOSITION Right 06/12/2015   Procedure: RIGHT CUBITAL TUNNEL RELEASE;  Surgeon: Milly Jakob, MD;  Location: [facility];  Service: Orthopedics;  Laterality: Right;    There were no vitals filed for this visit.       Subjective Assessment - 06/18/16 0830    Subjective Patient reports that she has been having right knee pain over the past 6 months, she had a "flexogenic" type shots over a 4 week period.  She reports a fall on December 18th.  Reports some pain in the left thigh and in the left shin, she reports to me that she does not feel that it is her knee.  X-rays were mostly negative some degenerative changes.     Limitations Walking   Patient Stated Goals have less pain   Currently in Pain? Yes   Pain Score 3     Pain Location Knee   Pain Orientation Left;Anterior   Pain Descriptors / Indicators Burning;Throbbing   Pain Type Acute pain   Pain Radiating Towards has pain in the anterior thigh and in the left shin area   Pain Onset More than a month ago   Pain Frequency Constant   Aggravating Factors  lying left side, stairs, walking, stnading will increase the pain, reports that she has to do stairs one at a time, pain up to 7/10   Pain Relieving Factors lying on back at best pain is a 2/10   Effect of Pain on Daily Activities difficulty walking and doing stairs            [facility] PT Assessment - 06/18/16 0001      Assessment   Medical Diagnosis left knee pain   Referring Provider Lynne Leader   Onset Date/Surgical Date 05/18/16   Prior Therapy about 2 years ago     Precautions   Precautions None     Balance Screen   Has the patient fallen in the past 6 months Yes   How many times? 1   Has the patient had a decrease in activity level because of a fear of falling?  No   Is the patient reluctant to leave their home because of  a fear of falling?  No     Home Environment   Additional Comments 3 stairs into the home     Prior Function   Level of Independence Independent   Vocation Full time employment   Vocation Requirements mostly sitting   Leisure some walking     Observation/Other Assessments-Edema    Edema --  mild left lateral knee edema     ROM / Strength   AROM / PROM / Strength AROM;Strength     AROM   Overall AROM Comments left knee AROM was 0-106 degrees flexion  worse pain iwth flexion     Strength   Overall Strength Comments 4+/5 with pain for extension in the left lateral thigh area     Flexibility   Soft Tissue Assessment /Muscle Length --  tight HS, very tight calf and ITB with pain     Palpation   Palpation comment she is tender over the patellar tendon, she is very tender in the vastus lateralis area, tender left ITB, some crepitus palpable with lateral  tracking patella     Ambulation/Gait   Gait Comments slight toe out gait, no device, slow, mild antalgic on the left, reports pain iwth full stance phase                   [facility] Adult PT Treatment/Exercise - 06/18/16 0001      Modalities   Modalities Moist Heat;Electrical Stimulation     Moist Heat Therapy   Number Minutes Moist Heat 15 Minutes   Moist Heat Location Hip;Knee     Electrical Stimulation   Electrical Stimulation Location left ITB   Electrical Stimulation Action IFC   Electrical Stimulation Parameters right side lying   Electrical Stimulation Goals Pain                PT Education - 06/18/16 [phone]    Education provided Yes   Education Details gave HEP for calf, ITB stretches, Wms flexion and soem extension of the back during her work days   Person(s) Educated Patient   Methods Explanation;Demonstration;Handout   Comprehension Verbalized understanding          PT Short Term Goals - 06/18/16 0927      PT SHORT TERM GOAL #1   Title independent with initial HEP   Time 2   Period Weeks   Status New           PT Long Term Goals - 06/18/16 0927      PT LONG TERM GOAL #1   Title understand posture at work and how to manage back issues and posture   Time 8   Period Weeks   Status New     PT LONG TERM GOAL #2   Title increased  ROM to WFL's  painfree    Time 8   Period Weeks   Status New     PT LONG TERM GOAL #3   Title Patient to tolerate sitting; stanidng; walking for 15 min without pain    Time 8   Period Weeks   Status New     PT LONG TERM GOAL #4   Title report no issues with sleeping   Time Aneta - 06/18/16 [id number]    Clinical Impression Statement PAtient has left knee area pain with most pain being into the vastus lateralis.  She  is very tight in the calves and the ITB, she has significant tenderness in the left ITB and left SI, she is very tight in the left lumbar  parapsinals.  She has had some injections in the knee but reports a fall about a month ago.  X-rays of the knee showed mild degenerative changes   Rehab Potential Good   PT Frequency 2x / week   PT Duration 8 weeks   PT Treatment/Interventions ADLs/Self Care Home Management;Cryotherapy;Electrical Stimulation;Iontophoresis 4mg /ml Dexamethasone;Moist Heat;Ultrasound;Gait training;Therapeutic activities;Therapeutic exercise;Patient/family education;Manual techniques;Taping   PT Next Visit Plan Could try VMO strengthening, ITB rolling STM and stretching   Consulted and Agree with Plan of Care Patient      Patient will benefit from skilled therapeutic intervention in order to improve the following deficits and impairments:  Abnormal gait, Decreased range of motion, Decreased strength, Difficulty walking, Increased fascial restricitons, Increased muscle spasms, Impaired flexibility, Postural dysfunction, Improper body mechanics, Pain  Visit Diagnosis: Acute pain of left knee - Plan: PT plan of care cert/re-cert  Difficulty in walking, not elsewhere classified - Plan: PT plan of care cert/re-cert     Problem List Patient Active Problem List   Diagnosis Date Noted  . Syncope 06/12/2016  . Malabsorption of iron 05/09/2016  . History of gastric bypass 05/09/2016  . Arthritis of knee, degenerative 04/14/2016  . Surgical menopause 03/24/2016  . Family history of breast cancer 03/24/2016  . Left knee pain 10/01/2015  . Hypoglycemia 06/12/2015  . Iron deficiency anemia 05/02/2015  . Cubital tunnel syndrome on right 09/14/2014  . Degenerative disc disease, cervical 07/27/2014  . Prediabetes 09/01/2013  . Fibromyalgia 04/11/2013  . Essential hypertension, benign 04/11/2013  . Anxiety state 04/11/2013    Sumner Boast., PT 06/18/2016, 9:31 AM  [address] [place], [place], [address] Phone: [phone]   Fax:   [phone]  Name: Stacy Woods MRN: [id number] Date of Birth: 1965-10-29

## 2016-06-23 ENCOUNTER — Ambulatory Visit (INDEPENDENT_AMBULATORY_CARE_PROVIDER_SITE_OTHER): Payer: 59 | Admitting: Family Medicine

## 2016-06-23 ENCOUNTER — Ambulatory Visit (INDEPENDENT_AMBULATORY_CARE_PROVIDER_SITE_OTHER): Payer: 59

## 2016-06-23 DIAGNOSIS — M5416 Radiculopathy, lumbar region: Secondary | ICD-10-CM

## 2016-06-23 DIAGNOSIS — M545 Low back pain: Secondary | ICD-10-CM | POA: Diagnosis not present

## 2016-06-23 DIAGNOSIS — M48061 Spinal stenosis, lumbar region without neurogenic claudication: Secondary | ICD-10-CM | POA: Diagnosis not present

## 2016-06-23 MED ORDER — PREDNISONE 5 MG (48) PO TBPK: ORAL_TABLET | ORAL | 0 refills | Status: DC

## 2016-06-23 NOTE — Patient Instructions (Signed)
Thank you for coming in today. Take prednisone daily for 12 days.  Use gabapentin at bedtime as needed for pain.  Come back or go to the emergency room if you notice new weakness new numbness problems walking or bowel or bladder problems. Recheck in 2-4 weeks.  If not better after prednisone or obviously failing treatment or if worsening let me know and we will do a MRI.    Sciatica Introduction Sciatica is pain, numbness, weakness, or tingling along your sciatic nerve. The sciatic nerve starts in the lower back and goes down the back of each leg. Sciatica happens when this nerve is pinched or has pressure put on it. Sciatica usually goes away on its own or with treatment. Sometimes, sciatica may keep coming back (recur). Follow these instructions at home: Medicines  Take over-the-counter and prescription medicines only as told by your doctor.  Do not drive or use heavy machinery while taking prescription pain medicine. Managing pain  If directed, put ice on the affected area.  Put ice in a plastic bag.  Place a towel between your skin and the bag.  Leave the ice on for 20 minutes, 2-3 times a day.  After icing, apply heat to the affected area before you exercise or as often as told by your doctor. Use the heat source that your doctor tells you to use, such as a moist heat pack or a heating pad.  Place a towel between your skin and the heat source.  Leave the heat on for 20-30 minutes.  Remove the heat if your skin turns bright red. This is especially important if you are unable to feel pain, heat, or cold. You may have a greater risk of getting burned. Activity  Return to your normal activities as told by your doctor. Ask your doctor what activities are safe for you.  Avoid activities that make your sciatica worse.  Take short rests during the day. Rest in a lying or standing position. This is usually better than sitting to rest.  When you rest for a long time, do some  physical activity or stretching between periods of rest.  Avoid sitting for a long time without moving. Get up and move around at least one time each hour.  Exercise and stretch regularly, as told by your doctor.  Do not lift anything that is heavier than 10 lb (4.5 kg) while you have symptoms of sciatica.  Avoid lifting heavy things even when you do not have symptoms.  Avoid lifting heavy things over and over.  When you lift objects, always lift in a way that is safe for your body. To do this, you should:  Bend your knees.  Keep the object close to your body.  Avoid twisting. General instructions  Use good posture.  Avoid leaning forward when you are sitting.  Avoid hunching over when you are standing.  Stay at a healthy weight.  Wear comfortable shoes that support your feet. Avoid wearing high heels.  Avoid sleeping on a mattress that is too soft or too hard. You might have less pain if you sleep on a mattress that is firm enough to support your back.  Keep all follow-up visits as told by your doctor. This is important. Contact a doctor if:  You have pain that:  Wakes you up when you are sleeping.  Gets worse when you lie down.  Is worse than the pain you have had in the past.  Lasts longer than 4 weeks.  You  lose weight for without trying. Get help right away if:  You cannot control when you pee (urinate) or poop (have a bowel movement).  You have weakness in any of these areas and it gets worse.  Lower back.  Lower belly (pelvis).  Butt (buttocks).  Legs.  You have redness or swelling of your back.  You have a burning feeling when you pee. This information is not intended to replace advice given to you by your health care provider. Make sure you discuss any questions you have with your health care provider. Document Released: 03/04/2008 Document Revised: 11/01/2015 Document Reviewed: 02/02/2015  2017 Elsevier

## 2016-06-23 NOTE — Progress Notes (Signed)
Stacy Woods is a 51 y.o. female who presents to [facility] today for left leg pain.   Patient notes continued left leg pain. This has been ongoing for a few weeks now. The pain occurred after a fall. She has tried physical therapy aimed at her leg and knee and has failed to improve. She denies any weakness or numbness. She notes the pain is worse with activity and better with rest. She denies any bowel or bladder dysfunction. No fevers or chills.   Past Medical History:  Diagnosis Date  . Anemia   . Anemia, iron deficiency 05/02/2015  . Fibromyalgia   . History of gastric bypass 05/09/2016  . Hypertension   . Malabsorption of iron 05/09/2016  . PONV (postoperative nausea and vomiting)   . Surgical menopause 03/24/2016   Past Surgical History:  Procedure Laterality Date  . ABDOMINAL HYSTERECTOMY  1997  . APPENDECTOMY    . CESAREAN SECTION  [id number]  . CHOLECYSTECTOMY    . GASTRIC BYPASS  2011  . KNEE ARTHROSCOPY  2002  . TONSILLECTOMY  1977  . ULNAR NERVE TRANSPOSITION Right 06/12/2015   Procedure: RIGHT CUBITAL TUNNEL RELEASE;  Surgeon: Milly Jakob, MD;  Location: [facility];  Service: Orthopedics;  Laterality: Right;   Social History  Substance Use Topics  . Smoking status: Never Smoker  . Smokeless tobacco: Never Used  . Alcohol use No     ROS:  As above   Medications: Current Outpatient Prescriptions  Medication Sig Dispense Refill  . AMBULATORY NON FORMULARY MEDICATION One Touch Glucometer per insurance formulary. One month of testing strips, and lancets used to check blood sugar daily. 1 Units prn  . escitalopram (LEXAPRO) 10 MG tablet TAKE 1 TABLET (10 MG TOTAL) BY MOUTH DAILY. 90 tablet 3  . lisinopril-hydrochlorothiazide (PRINZIDE,ZESTORETIC) 20-12.5 MG tablet Take 1 tablet by mouth daily. (Patient taking differently: Take 0.5 tablets by mouth daily. ) 90 tablet 2  . predniSONE (STERAPRED UNI-PAK  48 TAB) 5 MG (48) TBPK tablet 12 day dosepack po 48 tablet 0   No current facility-administered medications for this visit.    Allergies  Allergen Reactions  . Mobic [Meloxicam] Swelling  . Shellfish Allergy Nausea Only and Swelling  . Percocet [Oxycodone-Acetaminophen] Itching  . Lyrica [Pregabalin] Other (See Comments)    "zombie"     Exam:  BP 106/68   Pulse (!) 59  General: Well Developed, well nourished, and in no acute distress.  Neuro/Psych: Alert and oriented x3, extra-ocular muscles intact, able to move all 4 extremities, sensation grossly intact. Skin: Warm and dry, no rashes noted.  Respiratory: Not using accessory muscles, speaking in full sentences, trachea midline.  Cardiovascular: Pulses palpable, no extremity edema. Abdomen: Does not appear distended. MSK: L-spine is nontender to midline. Tender to palpation left lumbar paraspinal muscle group. Lumbar range of motion is intact however patient does experience pain with flexion. Positive left-sided slump test. Lower extremity strength is equal and normal throughout. Reflexes are equal and normal throughout bilateral knees and ankles. Sensation is intact throughout. Normal gait.  X-ray L-spine shows some DDD however no acute abnormalities are noted. Awaiting formal radiology review.    No results found for this or any previous visit (from the past 48 hour(s)). No results found.    Assessment and Plan: 51 y.o. female with  Leg pain very likely left L4 lumbar radiculopathy. Discussed options. Plan for a trial of prednisone as well as leftover gabapentin  at bedtime. Additionally we'll consider continuing physical therapy aimed at lumbar radiculopathy. Recheck in a few weeks. If not better will proceed with lumbar MRI.    Orders Placed This Encounter  Procedures  . DG Lumbar Spine Complete    Standing Status:   Future    Number of Occurrences:   1    Standing Expiration Date:   08/21/2017    Order Specific  Question:   Reason for Exam (SYMPTOM  OR DIAGNOSIS REQUIRED)    Answer:   eval lumbar spine pain radiating left leg ? L4    Order Specific Question:   Is patient pregnant?    Answer:   No    Order Specific Question:   Preferred imaging location?    Answer:   [facility]    Discussed warning signs or symptoms. Please see discharge instructions. Patient expresses understanding.

## 2016-07-02 ENCOUNTER — Encounter: Payer: Self-pay | Admitting: Family Medicine

## 2016-07-02 DIAGNOSIS — M5416 Radiculopathy, lumbar region: Secondary | ICD-10-CM

## 2016-07-03 MED ORDER — LORAZEPAM 0.5 MG PO TABS: ORAL_TABLET | ORAL | 0 refills | Status: DC

## 2016-07-03 NOTE — Addendum Note (Signed)
Addended by: Gregor Hams on: 07/03/2016 04:30 PM   Modules accepted: Orders

## 2016-07-04 MED FILL — LORazepam 0.5 MG TABS: 0.5 | 2 days supply | Qty: 4 | Fill #0

## 2016-07-05 ENCOUNTER — Ambulatory Visit (HOSPITAL_BASED_OUTPATIENT_CLINIC_OR_DEPARTMENT_OTHER)
Admission: RE | Admit: 2016-07-05 | Discharge: 2016-07-05 | Disposition: A | Discharge status: Home / Self Care | Payer: 59 | Source: Ambulatory Visit | Attending: Family Medicine | Admitting: Family Medicine

## 2016-07-05 DIAGNOSIS — M4726 Other spondylosis with radiculopathy, lumbar region: Secondary | ICD-10-CM | POA: Diagnosis not present

## 2016-07-05 DIAGNOSIS — M5416 Radiculopathy, lumbar region: Secondary | ICD-10-CM

## 2016-07-05 DIAGNOSIS — M5126 Other intervertebral disc displacement, lumbar region: Secondary | ICD-10-CM | POA: Diagnosis not present

## 2016-07-05 DIAGNOSIS — M5116 Intervertebral disc disorders with radiculopathy, lumbar region: Secondary | ICD-10-CM | POA: Diagnosis not present

## 2016-07-09 ENCOUNTER — Other Ambulatory Visit: Payer: Self-pay

## 2016-07-09 MED ORDER — LISINOPRIL-HYDROCHLOROTHIAZIDE 20-12.5 MG PO TABS: 1.0000 | ORAL_TABLET | Freq: Every day | ORAL | 2 refills | Status: DC

## 2016-07-09 MED FILL — ESCITALOPRAM 10 MG TABLET: 10 | 90 days supply | Qty: 90 | Fill #1

## 2016-07-09 MED FILL — LISINOPRIL-HCTZ 20-12.5 MG: 20-12.5 | 90 days supply | Qty: 90 | Fill #0

## 2016-07-10 ENCOUNTER — Encounter: Payer: Self-pay | Admitting: Family Medicine

## 2016-07-10 ENCOUNTER — Ambulatory Visit (INDEPENDENT_AMBULATORY_CARE_PROVIDER_SITE_OTHER): Payer: 59 | Admitting: Family Medicine

## 2016-07-10 VITALS — BP 101/61 | HR 60 | Wt 200.0 lb

## 2016-07-10 DIAGNOSIS — M5416 Radiculopathy, lumbar region: Secondary | ICD-10-CM | POA: Diagnosis not present

## 2016-07-10 MED ORDER — GABAPENTIN 300 MG PO CAPS: 300.0000 mg | ORAL_CAPSULE | Freq: Three times a day (TID) | ORAL | 1 refills | Status: DC

## 2016-07-10 MED FILL — GABAPENTIN 300 MG CAPSULE: 300 | 30 days supply | Qty: 90 | Fill #0

## 2016-07-10 NOTE — Patient Instructions (Signed)
Thank you for coming in today. Re-attend PT.  Increase gabapentin to 3x daily over the course of about a week.  Recheck in about 4-6 week or sooner if needed.  Return sooner if needed.

## 2016-07-11 NOTE — Progress Notes (Signed)
Stacy Woods is a 51 y.o. female who presents to [place]: [facility] today for follow-up leg pain. Patient has been seen several times for pain in the low back on the left side and radiates to the left leg. She had an MRI after she failed to improve with a few sessions of physical therapy. She notes that she's taken a course of prednisone and is currently using gabapentin at night. This has not helped very much yet. Symptoms are moderate and worse with activity.   Past Medical History:  Diagnosis Date  . Anemia   . Anemia, iron deficiency 05/02/2015  . Fibromyalgia   . History of gastric bypass 05/09/2016  . Hypertension   . Malabsorption of iron 05/09/2016  . PONV (postoperative nausea and vomiting)   . Surgical menopause 03/24/2016   Past Surgical History:  Procedure Laterality Date  . ABDOMINAL HYSTERECTOMY  1997  . APPENDECTOMY    . CESAREAN SECTION  [id number]  . CHOLECYSTECTOMY    . GASTRIC BYPASS  2011  . KNEE ARTHROSCOPY  2002  . TONSILLECTOMY  1977  . ULNAR NERVE TRANSPOSITION Right 06/12/2015   Procedure: RIGHT CUBITAL TUNNEL RELEASE;  Surgeon: Milly Jakob, MD;  Location: [facility];  Service: Orthopedics;  Laterality: Right;   Social History  Substance Use Topics  . Smoking status: Never Smoker  . Smokeless tobacco: Never Used  . Alcohol use No   family history includes Diabetes in her father; Heart failure in her father; Hypertension in her father; Stroke in her father.  ROS as above:  Medications: Current Outpatient Prescriptions  Medication Sig Dispense Refill  . AMBULATORY NON FORMULARY MEDICATION One Touch Glucometer per insurance formulary. One month of testing strips, and lancets used to check blood sugar daily. 1 Units prn  . escitalopram (LEXAPRO) 10 MG tablet TAKE 1 TABLET (10 MG TOTAL) BY MOUTH DAILY. 90 tablet 3  . gabapentin  (NEURONTIN) 300 MG capsule Take 1 capsule (300 mg total) by mouth 3 (three) times daily. 90 capsule 1  . lisinopril-hydrochlorothiazide (PRINZIDE,ZESTORETIC) 20-12.5 MG tablet Take 1 tablet by mouth daily. 90 tablet 2  . LORazepam (ATIVAN) 0.5 MG tablet Take 1-2 tabs 30-60 min prior to MRI 4 tablet 0   No current facility-administered medications for this visit.    Allergies  Allergen Reactions  . Mobic [Meloxicam] Swelling  . Shellfish Allergy Nausea Only and Swelling  . Percocet [Oxycodone-Acetaminophen] Itching  . Lyrica [Pregabalin] Other (See Comments)    "zombie"    Health Maintenance Health Maintenance  Topic Date Due  . HIV Screening  02/26/1983  . TETANUS/TDAP  03/24/2026  . INFLUENZA VACCINE  Completed     Exam:  BP 101/61   Pulse 60   Wt 200 lb (90.7 kg)   BMI 34.33 kg/m  Gen: Well NAD MSK: Nontender to spinal midline. Mildly tender to palpation left lumbar paraspinal muscle group. Tender to palpation left anterior thigh. Tender to palpation left anterior shin. Knee motion is normal. In the extension and flexion strength is normal. Lower extremity reflexes sensation and strength are intact throughout. Normal gait.  Study Result   CLINICAL DATA:  Lumbar radiculopathy.  Left hip and leg pain  EXAM: MRI LUMBAR SPINE WITHOUT CONTRAST  TECHNIQUE: Multiplanar, multisequence MR imaging of the lumbar spine was performed. No intravenous contrast was administered.  COMPARISON:  Lumbar radiographs 06/23/2016  FINDINGS: Segmentation:  Normal  Alignment:  Normal  Vertebrae:  Normal  Conus medullaris: Extends to the mid L1 Level and appears normal.  Paraspinal and other soft tissues: paraspinous muscles symmetric and normal. No retroperitoneal mass or adenopathy  Disc levels:  L1-2:  Negative  L2-3:  Negative  L3-4:  Mild facet degeneration.  No significant stenosis.  L4-5: Small central disc protrusion and mild facet  degeneration. Negative for canal or foraminal stenosis.  L5-S1:  Negative  IMPRESSION: Mild lumbar degenerative change without neural impingement or stenosis. Small central disc protrusion and mild facet degeneration at L4-5.   Electronically Signed   By: Franchot Gallo M.D.   On: 07/05/2016 10:11      No results found for this or any previous visit (from the past 72 hour(s)). No results found.    Assessment and Plan: 51 y.o. female with Left leg pain. At this point etiology is somewhat unclear. Her symptoms are consistent with left L4 lumbar radiculopathy. However the MRI really doesn't show a lot of impingement at this area. It's possible that she has more dynamic process that causes worsening impingement when she stands or moves that we do not see on today's MRI.  It is also possible that she has 3 separate injuries one in her low back one in her anterior thigh and one in her shin. It is point I would recommend that she increase her gabapentin to 3 times daily as well as return back to physical therapy for a longer course of treatment.  If not improved at and we are still pretty convinced that this is lumbar radiculopathy with may want to consider a nerve conduction study or a trial of epidural steroid injection.  Recheck in about 4 weeks.   No orders of the defined types were placed in this encounter.   Discussed warning signs or symptoms. Please see discharge instructions. Patient expresses understanding.

## 2016-07-31 ENCOUNTER — Ambulatory Visit (INDEPENDENT_AMBULATORY_CARE_PROVIDER_SITE_OTHER): Payer: 59 | Admitting: Family Medicine

## 2016-07-31 ENCOUNTER — Ambulatory Visit (INDEPENDENT_AMBULATORY_CARE_PROVIDER_SITE_OTHER): Payer: 59

## 2016-07-31 ENCOUNTER — Encounter: Payer: Self-pay | Admitting: Family Medicine

## 2016-07-31 VITALS — BP 129/85 | HR 68

## 2016-07-31 DIAGNOSIS — M79642 Pain in left hand: Secondary | ICD-10-CM

## 2016-07-31 DIAGNOSIS — R55 Syncope and collapse: Secondary | ICD-10-CM

## 2016-07-31 DIAGNOSIS — M25532 Pain in left wrist: Secondary | ICD-10-CM | POA: Diagnosis not present

## 2016-07-31 DIAGNOSIS — M7989 Other specified soft tissue disorders: Secondary | ICD-10-CM | POA: Diagnosis not present

## 2016-07-31 DIAGNOSIS — S6992XA Unspecified injury of left wrist, hand and finger(s), initial encounter: Secondary | ICD-10-CM | POA: Diagnosis not present

## 2016-07-31 NOTE — Progress Notes (Signed)
Stacy Woods is a 51 y.o. female who presents to [place]: [place] today for fall and left hand injury. [name] fell today while walking. She is not sure exactly what happened. She thinks that perhaps her legs gave way or she, "just fell". She denies any LOC, palpitation or CP prior to the fall. She was able to garden with her hands and injured her left hand and wrist during the fall. She's had 3 episodes of tripping or falling or fainting in the last several months. The first episode occurred several months ago and was thought to be related to low blood pressure due to her blood pressure medications. She notes she's been taking a half dose of her typical blood pressure pill and has been feeling fine until today.  She notes pain in the left hand and wrist both at the ulnar and radial side of the hand. She denies any radiating pain weakness or numbness.   Past Medical History:  Diagnosis Date  . Anemia   . Anemia, iron deficiency 05/02/2015  . Fibromyalgia   . History of gastric bypass 05/09/2016  . Hypertension   . Malabsorption of iron 05/09/2016  . PONV (postoperative nausea and vomiting)   . Surgical menopause 03/24/2016   Past Surgical History:  Procedure Laterality Date  . ABDOMINAL HYSTERECTOMY  1997  . APPENDECTOMY    . CESAREAN SECTION  [id number]  . CHOLECYSTECTOMY    . GASTRIC BYPASS  2011  . KNEE ARTHROSCOPY  2002  . TONSILLECTOMY  1977  . ULNAR NERVE TRANSPOSITION Right 06/12/2015   Procedure: RIGHT CUBITAL TUNNEL RELEASE;  Surgeon: Milly Jakob, MD;  Location: [facility];  Service: Orthopedics;  Laterality: Right;   Social History  Substance Use Topics  . Smoking status: Never Smoker  . Smokeless tobacco: Never Used  . Alcohol use No   family history includes Diabetes in her father; Heart failure in her father; Hypertension in her father;  Stroke in her father.  ROS as above:  Medications: Current Outpatient Prescriptions  Medication Sig Dispense Refill  . AMBULATORY NON FORMULARY MEDICATION One Touch Glucometer per insurance formulary. One month of testing strips, and lancets used to check blood sugar daily. 1 Units prn  . escitalopram (LEXAPRO) 10 MG tablet TAKE 1 TABLET (10 MG TOTAL) BY MOUTH DAILY. 90 tablet 3  . gabapentin (NEURONTIN) 300 MG capsule Take 1 capsule (300 mg total) by mouth 3 (three) times daily. 90 capsule 1  . lisinopril-hydrochlorothiazide (PRINZIDE,ZESTORETIC) 20-12.5 MG tablet Take 1 tablet by mouth daily. 90 tablet 2  . LORazepam (ATIVAN) 0.5 MG tablet Take 1-2 tabs 30-60 min prior to MRI 4 tablet 0   No current facility-administered medications for this visit.    Allergies  Allergen Reactions  . Mobic [Meloxicam] Swelling  . Shellfish Allergy Nausea Only and Swelling  . Percocet [Oxycodone-Acetaminophen] Itching  . Lyrica [Pregabalin] Other (See Comments)    "zombie"    Health Maintenance Health Maintenance  Topic Date Due  . HIV Screening  02/26/1983  . TETANUS/TDAP  03/24/2026  . INFLUENZA VACCINE  Completed     Exam:  BP 129/85   Pulse 68  Gen: Well NAD Nontoxic appearing HEENT: EOMI,  MMM Lungs: Normal work of breathing. CTABL Heart: RRR no MRG Abd: NABS, Soft. Nondistended, Nontender Exts: Brisk capillary refill, warm and well perfused.  Left arm: Tender normal motion Elbow normal-appearing nontender normal motion Wrist slightly swollen. Diffusely  tender. Decreased motion due to pain. Left hand: Tender to palpation at the fourth metacarpal. Nontender into the phalanx. Pulses capillary refill and sensation intact distally.  Twelve-lead EKG shows normal sinus rhythm at 68 bpm. No ST segment elevation or depression. Normal P waves and no Q waves present. No significant T-wave changes. Impression includes inferior infarct age undetermined. I do not agree. No significant change  from prior EKG dated 06/06/2015. The EKG will be scanned into the computer   Patient was placed into a well-formed sugar tong splint.  Extra molding was used to help control thumb motion to include a bit of thumb spica control.  No results found for this or any previous visit (from the past 72 hour(s)). Dg Wrist Complete Left  Result Date: 07/31/2016 CLINICAL DATA:  Fall. EXAM: LEFT WRIST - COMPLETE 3+ VIEW COMPARISON:  05/26/2016 . FINDINGS: No acute bony or joint abnormality identified. No evidence of fracture or dislocation. Fuse mild degenerative change. IMPRESSION: Diffuse mild degenerative change. No acute bony abnormality identified. Electronically Signed   By: Marcello Moores  Register   On: 07/31/2016 15:39   Dg Hand Complete Left  Result Date: 07/31/2016 CLINICAL DATA:  year-old female fell today and hurt her LEFT hand/ wrist. C/O pain to the mid-metacarpal area w/ swelling. EXAM: LEFT HAND - COMPLETE 3+ VIEW COMPARISON:  12/18/ 17 FINDINGS: Three views of the left hand submitted. No acute fracture or subluxation. A metallic jewelry ring is noted on fourth finger. IMPRESSION: Negative. Electronically Signed   By: Lahoma Crocker M.D.   On: 07/31/2016 15:39      Assessment and Plan: 51 y.o. female with  Fall. Unclear etiology. I'm not sure if this was syncopal or presyncopal or mechanical or neurologic. The it is difficult to truly characterize the fall. I think she was conscious because she was able to get her hands in front of her face to guard her face during the fall.  We'll proceed with a little bit more workup. EKG today was largely unremarkable. However I think is reasonable to obtain an echocardiogram.  We will recheck in about a week or so.  Left hand and wrist injury: No obvious fractures on x-ray. Patient is pretty tender and guards with motion. I think it's reasonable to use a sugar tong splint for week and recheck.   Orders Placed This Encounter  Procedures  . DG Hand Complete Left      Standing Status:   Future    Number of Occurrences:   1    Standing Expiration Date:   09/28/2017    Order Specific Question:   Reason for Exam (SYMPTOM  OR DIAGNOSIS REQUIRED)    Answer:   fell hurt hand and wrisr    Order Specific Question:   Is patient pregnant?    Answer:   No    Order Specific Question:   Preferred imaging location?    Answer:   [facility]  . DG Wrist Complete Left    Standing Status:   Future    Number of Occurrences:   1    Standing Expiration Date:   09/28/2017    Order Specific Question:   Reason for Exam (SYMPTOM  OR DIAGNOSIS REQUIRED)    Answer:   fell pain hamate and scaphoid areas    Order Specific Question:   Is patient pregnant?    Answer:   No    Order Specific Question:   Preferred imaging location?    Answer:   [facility]  . EKG 12-Lead   No orders of the defined types were placed in this encounter.    Discussed warning signs or symptoms. Please see discharge instructions. Patient expresses understanding.

## 2016-07-31 NOTE — Patient Instructions (Signed)
Thank you for coming in today. We will do a bit of a heart workup.  Please add balance and gait to the PT you are doing.  Recheck in 1 week.    Cast or Splint Care Casts and splints support injured limbs and keep bones from moving while they heal.  HOME CARE  Keep the cast or splint uncovered during the drying period.  A plaster cast can take 24 to 48 hours to dry.  A fiberglass cast will dry in less than 1 hour.  Do not rest the cast on anything harder than a pillow for 24 hours.  Do not put weight on your injured limb. Do not put pressure on the cast. Wait for your doctor's approval.  Keep the cast or splint dry.  Cover the cast or splint with a plastic bag during baths or wet weather.  If you have a cast over your chest and belly (trunk), take sponge baths until the cast is taken off.  If your cast gets wet, dry it with a towel or blow dryer. Use the cool setting on the blow dryer.  Keep your cast or splint clean. Wash a dirty cast with a damp cloth.  Do not put any objects under your cast or splint.  Do not scratch the skin under the cast with an object. If itching is a problem, use a blow dryer on a cool setting over the itchy area.  Do not trim or cut your cast.  Do not take out the padding from inside your cast.  Exercise your joints near the cast as told by your doctor.  Raise (elevate) your injured limb on 1 or 2 pillows for the first 1 to 3 days. GET HELP IF:  Your cast or splint cracks.  Your cast or splint is too tight or too loose.  You itch badly under the cast.  Your cast gets wet or has a soft spot.  You have a bad smell coming from the cast.  You get an object stuck under the cast.  Your skin around the cast becomes red or sore.  You have new or more pain after the cast is put on. GET HELP RIGHT AWAY IF:  You have fluid leaking through the cast.  You cannot move your fingers or toes.  Your fingers or toes turn blue or white or are cool,  painful, or puffy (swollen).  You have tingling or lose feeling (numbness) around the injured area.  You have bad pain or pressure under the cast.  You have trouble breathing or have shortness of breath.  You have chest pain. This information is not intended to replace advice given to you by your health care provider. Make sure you discuss any questions you have with your health care provider. Document Released: 09/25/2010 Document Revised: 01/26/2013 Document Reviewed: 12/02/2012 Elsevier Interactive Patient Education  2017 [employer].

## 2016-08-01 ENCOUNTER — Encounter: Payer: Self-pay | Admitting: Family Medicine

## 2016-08-04 ENCOUNTER — Encounter: Payer: Self-pay | Admitting: Family Medicine

## 2016-08-04 DIAGNOSIS — M79642 Pain in left hand: Secondary | ICD-10-CM

## 2016-08-07 ENCOUNTER — Ambulatory Visit: Payer: 59 | Admitting: Family Medicine

## 2016-08-12 ENCOUNTER — Ambulatory Visit: Payer: 59 | Attending: Family Medicine | Admitting: Physical Therapy

## 2016-08-12 ENCOUNTER — Encounter: Payer: Self-pay | Admitting: Physical Therapy

## 2016-08-12 DIAGNOSIS — R262 Difficulty in walking, not elsewhere classified: Secondary | ICD-10-CM | POA: Diagnosis not present

## 2016-08-12 DIAGNOSIS — M5442 Lumbago with sciatica, left side: Secondary | ICD-10-CM | POA: Diagnosis not present

## 2016-08-12 NOTE — Therapy (Signed)
[address] [address] [place], [place], [address] Phone: [phone]   Fax:  [phone]  Physical Therapy Evaluation  Patient Details  Name: Stacy Woods MRN: [id number] Date of Birth: [birth year] Referring Provider: Lynne Leader  Encounter Date: 08/12/2016      PT End of Session - 08/12/16 1515    Visit Number 1   Date for PT Re-Evaluation 10/12/16   PT Start Time 1441   PT Stop Time 1533   PT Time Calculation (min) 52 min   Activity Tolerance Patient tolerated treatment well   Behavior During Therapy [facility] for tasks assessed/performed      Past Medical History:  Diagnosis Date  . Anemia   . Anemia, iron deficiency 05/02/2015  . Fibromyalgia   . History of gastric bypass 05/09/2016  . Hypertension   . Malabsorption of iron 05/09/2016  . PONV (postoperative nausea and vomiting)   . Surgical menopause 03/24/2016    Past Surgical History:  Procedure Laterality Date  . ABDOMINAL HYSTERECTOMY  1997  . APPENDECTOMY    . CESAREAN SECTION  [id number]  . CHOLECYSTECTOMY    . GASTRIC BYPASS  2011  . KNEE ARTHROSCOPY  2002  . TONSILLECTOMY  1977  . ULNAR NERVE TRANSPOSITION Right 06/12/2015   Procedure: RIGHT CUBITAL TUNNEL RELEASE;  Surgeon: Milly Jakob, MD;  Location: [facility];  Service: Orthopedics;  Laterality: Right;    There were no vitals filed for this visit.       Subjective Assessment - 08/12/16 1444    Subjective Patient has had 3 falls in the past 3 months.  MRI shows DDD at two levels and a mild central disc protrusion.  She reports that much of the time the falls occur after long period of sitting.   Limitations Walking   Patient Stated Goals have no falls   Currently in Pain? Yes   Pain Score 5    Pain Location Leg   Pain Orientation Upper;Lower;Anterior   Pain Descriptors / Indicators Burning;Stabbing   Pain Type Acute pain   Pain Radiating Towards pain left anterior thigh  and the left shin down to the top of the foot   Pain Onset More than a month ago   Pain Frequency Constant   Aggravating Factors  being on her feet for over 30 minutes pain will go up to 10/10   Pain Relieving Factors heat and rest pain at best will be 4/10   Effect of Pain on Daily Activities walking, lifting grandchild            [facility] PT Assessment - 08/12/16 0001      Assessment   Medical Diagnosis difficulty walking   Referring Provider Lynne Leader   Onset Date/Surgical Date 07/15/16     Precautions   Precautions None     Balance Screen   Has the patient fallen in the past 6 months Yes   How many times? 3   Has the patient had a decrease in activity level because of a fear of falling?  No   Is the patient reluctant to leave their home because of a fear of falling?  No     Home Environment   Additional Comments 3 stairs into the home     Prior Function   Level of Independence Independent   Vocation Full time employment   Vocation Requirements mostly sitting   Leisure some walking     AROM  Overall AROM Comments AROM of the left knee is 0-107 degrees with c/o tightness in the quad, lumbar ROM decreased 50% with pain in the left SI and lumbar area     Strength   Overall Strength Comments 4/5 for the left LE with pain in the left low back area     Palpation   Palpation comment very tender over the left SI, left parapsinals and left buttocks, very tender in the distal ITB and the dista shin     Ambulation/Gait   Gait Comments no device, slight toe out, maybe a slightly shorter stance phase on the left, difficulty with stregnth control of the left leg up and down the stairs                   [facility] Adult PT Treatment/Exercise - 08/12/16 0001      Moist Heat Therapy   Number Minutes Moist Heat 15 Minutes   Moist Heat Location Knee;Lumbar Spine     Electrical Stimulation   Electrical Stimulation Location left SI, left knee area   Electrical Stimulation  Action premod   Electrical Stimulation Parameters supine   Electrical Stimulation Goals Pain                PT Education - 08/12/16 1515    Education provided Yes   Education Details wms flexion   Person(s) Educated Patient   Methods Explanation;Demonstration;Handout   Comprehension Verbalized understanding          PT Short Term Goals - 08/12/16 1519      PT SHORT TERM GOAL #1   Title independent with initial HEP   Time 2   Period Weeks   Status New           PT Long Term Goals - 08/12/16 1519      PT LONG TERM GOAL #1   Title understand posture at work and how to manage back issues and posture   Time 8   Period Weeks   Status New     PT LONG TERM GOAL #2   Title increased  ROM to WFL's  painfree    Time 8   Period Weeks   Status New     PT LONG TERM GOAL #3   Title Patient to tolerate sitting; standing; walking for 15 min without pain    Time 8   Period Weeks   Status New     PT LONG TERM GOAL #4   Title report no issues with sleeping   Time 8   Period Weeks     PT LONG TERM GOAL #5   Title no falls over a 6 week period   Time 8   Period Weeks   Status New               Plan - 08/12/16 1515    Clinical Impression Statement Patient has had 3 falls over the past 6 months, MRI shows a small disc protrusion at L4-5 and some DDD.  She is having c/o weakness in the left LE, she reports pain in the left low back, left anterior thigh area, the left shin area.  HS are tight and cause the pain in the leg, however stretching the hip flexor and the quad replicated her pain   Rehab Potential Good   PT Frequency 2x / week   PT Duration 8 weeks   PT Treatment/Interventions ADLs/Self Care Home Management;Cryotherapy;Electrical Stimulation;Iontophoresis 4mg /ml Dexamethasone;Moist Heat;Ultrasound;Gait training;Therapeutic activities;Therapeutic exercise;Patient/family education;Manual techniques;Taping;Traction  PT Next Visit Plan would like to try  lumbar pelvic stabilization and traction   Consulted and Agree with Plan of Care Patient      Patient will benefit from skilled therapeutic intervention in order to improve the following deficits and impairments:  Abnormal gait, Decreased range of motion, Decreased strength, Difficulty walking, Increased fascial restricitons, Increased muscle spasms, Impaired flexibility, Postural dysfunction, Improper body mechanics, Pain  Visit Diagnosis: Acute left-sided low back pain with left-sided sciatica - Plan: PT plan of care cert/re-cert  Difficulty in walking, not elsewhere classified - Plan: PT plan of care cert/re-cert     Problem List Patient Active Problem List   Diagnosis Date Noted  . Left lumbar radiculitis 06/23/2016  . Syncope 06/12/2016  . Malabsorption of iron 05/09/2016  . History of gastric bypass 05/09/2016  . Arthritis of knee, degenerative 04/14/2016  . Surgical menopause 03/24/2016  . Family history of breast cancer 03/24/2016  . Left knee pain 10/01/2015  . Hypoglycemia 06/12/2015  . Iron deficiency anemia 05/02/2015  . Cubital tunnel syndrome on right 09/14/2014  . Degenerative disc disease, cervical 07/27/2014  . Prediabetes 09/01/2013  . Fibromyalgia 04/11/2013  . Essential hypertension, benign 04/11/2013  . Anxiety state 04/11/2013    Sumner Boast., PT 08/12/2016, 3:23 PM  [address] [place], [place], [address] Phone: [phone]   Fax:  [phone]  Name: Stacy Woods MRN: [id number] Date of Birth: 11/21/1965

## 2016-08-18 ENCOUNTER — Ambulatory Visit (INDEPENDENT_AMBULATORY_CARE_PROVIDER_SITE_OTHER): Payer: 59 | Admitting: Family Medicine

## 2016-08-18 ENCOUNTER — Ambulatory Visit (INDEPENDENT_AMBULATORY_CARE_PROVIDER_SITE_OTHER): Payer: 59

## 2016-08-18 ENCOUNTER — Encounter: Payer: Self-pay | Admitting: Family Medicine

## 2016-08-18 DIAGNOSIS — M79652 Pain in left thigh: Secondary | ICD-10-CM

## 2016-08-18 DIAGNOSIS — M25552 Pain in left hip: Secondary | ICD-10-CM | POA: Diagnosis not present

## 2016-08-18 DIAGNOSIS — M25559 Pain in unspecified hip: Secondary | ICD-10-CM | POA: Diagnosis not present

## 2016-08-18 DIAGNOSIS — M545 Low back pain: Secondary | ICD-10-CM

## 2016-08-18 NOTE — Progress Notes (Signed)
Stacy Woods is a 51 y.o. female who presents to [facility] today for left leg pain. Patient has been seen several times for left leg pain initially thought to be due to lumbar radiculopathy. She had an MRI on January 27 which showed mild bulging disc but no obvious nerve compression. She has been to physical therapy and was isolated stretching of the left hip flexors as a reproduction of her pain. She notes her pain is been a little worse after one visit with physical therapy. She notes pain is into the anterior thigh and lower leg. She denies bowel bladder dysfunction.   Past Medical History:  Diagnosis Date  . Anemia   . Anemia, iron deficiency 05/02/2015  . Fibromyalgia   . History of gastric bypass 05/09/2016  . Hypertension   . Malabsorption of iron 05/09/2016  . PONV (postoperative nausea and vomiting)   . Surgical menopause 03/24/2016   Past Surgical History:  Procedure Laterality Date  . ABDOMINAL HYSTERECTOMY  1997  . APPENDECTOMY    . CESAREAN SECTION  [id number]  . CHOLECYSTECTOMY    . GASTRIC BYPASS  2011  . KNEE ARTHROSCOPY  2002  . TONSILLECTOMY  1977  . ULNAR NERVE TRANSPOSITION Right 06/12/2015   Procedure: RIGHT CUBITAL TUNNEL RELEASE;  Surgeon: Milly Jakob, MD;  Location: [facility];  Service: Orthopedics;  Laterality: Right;   Social History  Substance Use Topics  . Smoking status: Never Smoker  . Smokeless tobacco: Never Used  . Alcohol use No     ROS:  As above   Medications: Current Outpatient Prescriptions  Medication Sig Dispense Refill  . AMBULATORY NON FORMULARY MEDICATION One Touch Glucometer per insurance formulary. One month of testing strips, and lancets used to check blood sugar daily. 1 Units prn  . escitalopram (LEXAPRO) 10 MG tablet TAKE 1 TABLET (10 MG TOTAL) BY MOUTH DAILY. 90 tablet 3  . LORazepam (ATIVAN) 0.5 MG tablet Take 1-2 tabs 30-60 min prior to MRI 4 tablet 0   No  current facility-administered medications for this visit.    Allergies  Allergen Reactions  . Mobic [Meloxicam] Swelling  . Shellfish Allergy Nausea Only and Swelling  . Percocet [Oxycodone-Acetaminophen] Itching  . Lyrica [Pregabalin] Other (See Comments)    "zombie"     Exam:  BP 106/61   Pulse 65   Wt 199 lb (90.3 kg)   BMI 34.16 kg/m  General: Well Developed, well nourished, and in no acute distress.  Neuro/Psych: Alert and oriented x3, extra-ocular muscles intact, able to move all 4 extremities, sensation grossly intact. Skin: Warm and dry, no rashes noted.  Respiratory: Not using accessory muscles, speaking in full sentences, trachea midline.  Cardiovascular: Pulses palpable, no extremity edema. Abdomen: Does not appear distended. MSK: Left hip: Nontender. Some reproduction of pain with internal rotation. Pulses capillary refill and sensation intact distally.    No results found for this or any previous visit (from the past 48 hour(s)). Dg Hip Unilat With Pelvis 2-3 Views Left  Result Date: 08/18/2016 CLINICAL DATA:  51 year old female with left lower back and hip pain for 6 months. No known injury. Initial encounter. EXAM: DG HIP (WITH OR WITHOUT PELVIS) 2-3V LEFT COMPARISON:  None. FINDINGS: No fracture or dislocation. Preservation of left hip joint space. No plain film evidence of left femoral head avascular necrosis. Tiny sclerotic focus left pubic body may represent a small bone island. Minimal bony overgrowth pubic symphysis region. IMPRESSION: Negative.  Electronically Signed   By: Genia Del M.D.   On: 08/18/2016 09:14    Study Result   CLINICAL DATA:  Lumbar radiculopathy.  Left hip and leg pain  EXAM: MRI LUMBAR SPINE WITHOUT CONTRAST  TECHNIQUE: Multiplanar, multisequence MR imaging of the lumbar spine was performed. No intravenous contrast was administered.  COMPARISON:  Lumbar radiographs 06/23/2016  FINDINGS: Segmentation:   Normal  Alignment:  Normal  Vertebrae:  Normal  Conus medullaris: Extends to the mid L1 Level and appears normal.  Paraspinal and other soft tissues: paraspinous muscles symmetric and normal. No retroperitoneal mass or adenopathy  Disc levels:  L1-2:  Negative  L2-3:  Negative  L3-4:  Mild facet degeneration.  No significant stenosis.  L4-5: Small central disc protrusion and mild facet degeneration. Negative for canal or foraminal stenosis.  L5-S1:  Negative  IMPRESSION: Mild lumbar degenerative change without neural impingement or stenosis. Small central disc protrusion and mild facet degeneration at L4-5.   Electronically Signed   By: Franchot Gallo M.D.   On: 07/05/2016 10:11     Assessment and Plan: 51 y.o. female with left anterior hip and leg pain. Unclear etiology. I suspect interarticular hip is the likely cause of pain versus hip flexor etiology. We'll proceed with PT. If not better well follow-up with a diagnostic and hopefully therapeutic interarticular left hip injection.    Orders Placed This Encounter  Procedures  . DG HIP UNILAT WITH PELVIS 2-3 VIEWS LEFT    Standing Status:   Future    Number of Occurrences:   1    Standing Expiration Date:   10/18/2017    Order Specific Question:   Reason for Exam (SYMPTOM  OR DIAGNOSIS REQUIRED)    Answer:   eval left hip and thigh pain suspect djd    Order Specific Question:   Is patient pregnant?    Answer:   No    Order Specific Question:   Preferred imaging location?    Answer:   [facility]    Discussed warning signs or symptoms. Please see discharge instructions. Patient expresses understanding.  CC: Sumner Boast, PT

## 2016-08-18 NOTE — Patient Instructions (Signed)
Thank you for coming in today.  I will let you know xray results when I hear them Please continue PT.  Recheck in 4 weeks or sooner if needed.  If not better we will proceed with a diagnostic hip injection in the office.  Let me know how you are feeling.  We may be getting lab work in the near future as well depending on symptoms.

## 2016-08-19 ENCOUNTER — Ambulatory Visit: Payer: 59 | Admitting: Physical Therapy

## 2016-08-19 DIAGNOSIS — M5442 Lumbago with sciatica, left side: Secondary | ICD-10-CM

## 2016-08-19 DIAGNOSIS — R262 Difficulty in walking, not elsewhere classified: Secondary | ICD-10-CM | POA: Diagnosis not present

## 2016-08-19 NOTE — Therapy (Signed)
[facility] [place], [place], [address] Phone: [phone]   Fax:  [phone]  Physical Therapy Treatment  Patient Details  Name: Stacy Woods MRN: [id number] Date of Birth: May 08, 1966 Referring Provider: Lynne Leader  Encounter Date: 08/19/2016      PT End of Session - 08/19/16 0929    Visit Number 2   Date for PT Re-Evaluation 10/12/16   PT Start Time 0846   PT Stop Time 0926   PT Time Calculation (min) 40 min   Activity Tolerance Patient tolerated treatment well   Behavior During Therapy [facility] for tasks assessed/performed      Past Medical History:  Diagnosis Date  . Anemia   . Anemia, iron deficiency 05/02/2015  . Fibromyalgia   . History of gastric bypass 05/09/2016  . Hypertension   . Malabsorption of iron 05/09/2016  . PONV (postoperative nausea and vomiting)   . Surgical menopause 03/24/2016    Past Surgical History:  Procedure Laterality Date  . ABDOMINAL HYSTERECTOMY  1997  . APPENDECTOMY    . CESAREAN SECTION  [id number]  . CHOLECYSTECTOMY    . GASTRIC BYPASS  2011  . KNEE ARTHROSCOPY  2002  . TONSILLECTOMY  1977  . ULNAR NERVE TRANSPOSITION Right 06/12/2015   Procedure: RIGHT CUBITAL TUNNEL RELEASE;  Surgeon: Milly Jakob, MD;  Location: [facility];  Service: Orthopedics;  Laterality: Right;    There were no vitals filed for this visit.      Subjective Assessment - 08/19/16 0848    Subjective Went to MD yesterday - did an x-ray - reports she may have some hip impingment - want patient to continue PT - at that point will do a diagnostic injection - per patient report   Patient Stated Goals have no falls   Currently in Pain? Yes   Pain Score 6    Pain Location Back  and L hip and down leg   Pain Orientation Left   Pain Descriptors / Indicators Burning;Sharp;Stabbing   Pain Type Acute pain   Pain Onset More than a month ago                          [facility] Adult PT Treatment/Exercise - 08/19/16 0852      Exercises   Exercises Lumbar;Knee/Hip     Lumbar Exercises: Stretches   Single Knee to Chest Stretch 3 reps;30 seconds   Single Knee to Chest Stretch Limitations L     Lumbar Exercises: Seated   Other Seated Lumbar Exercises ball rollouts - forward 10 x 5 seconds     Lumbar Exercises: Supine   Other Supine Lumbar Exercises L LE fallout - with core engagement prior to movement 2 x 10     Knee/Hip Exercises: Stretches   Passive Hamstring Stretch Left;3 reps;30 seconds   Passive Hamstring Stretch Limitations seated with L LE extended   Quad Stretch Left   Quad Stretch Limitations standing knee on mat table - not tolerable due to hip pain   Piriformis Stretch Left;3 reps;30 seconds   Piriformis Stretch Limitations seated figure 4     Knee/Hip Exercises: Aerobic   Stationary Bike L1 x 6 minutes     Knee/Hip Exercises: Supine   Bridges 5 reps   Straight Leg Raises Left;2 sets;10 reps                PT Education - 08/19/16 0930  Education provided Yes   Education Details posture and body mechanics handout   Person(s) Educated Patient   Methods Explanation;Demonstration;Handout   Comprehension Verbalized understanding;Returned demonstration          PT Short Term Goals - 08/12/16 1519      PT SHORT TERM GOAL #1   Title independent with initial HEP   Time 2   Period Weeks   Status New           PT Long Term Goals - 08/12/16 1519      PT LONG TERM GOAL #1   Title understand posture at work and how to manage back issues and posture   Time 8   Period Weeks   Status New     PT LONG TERM GOAL #2   Title increased  ROM to WFL's  painfree    Time 8   Period Weeks   Status New     PT LONG TERM GOAL #3   Title Patient to tolerate sitting; standing; walking for 15 min without pain    Time 8   Period Weeks   Status New     PT LONG TERM GOAL #4   Title  report no issues with sleeping   Time 8   Period Weeks     PT LONG TERM GOAL #5   Title no falls over a 6 week period   Time 8   Period Weeks   Status New               Plan - 08/19/16 0936    Clinical Impression Statement Patient reporting contiued back pain limiting tolerance to HEP and stretching. Reports she saw MD yesterday - performed x-rays. Patient stating MD wants her to continue PT at this time. Gentle stretching and strengthening today per patient tolerance as well as review of posture and body mechanics with good carryover.    PT Treatment/Interventions ADLs/Self Care Home Management;Cryotherapy;Electrical Stimulation;Iontophoresis 4mg /ml Dexamethasone;Moist Heat;Ultrasound;Gait training;Therapeutic activities;Therapeutic exercise;Patient/family education;Manual techniques;Taping;Traction   PT Next Visit Plan would like to try lumbar pelvic stabilization and traction   Consulted and Agree with Plan of Care Patient      Patient will benefit from skilled therapeutic intervention in order to improve the following deficits and impairments:  Abnormal gait, Decreased range of motion, Decreased strength, Difficulty walking, Increased fascial restricitons, Increased muscle spasms, Impaired flexibility, Postural dysfunction, Improper body mechanics, Pain  Visit Diagnosis: Acute left-sided low back pain with left-sided sciatica  Difficulty in walking, not elsewhere classified     Problem List Patient Active Problem List   Diagnosis Date Noted  . Left thigh pain 08/18/2016  . Syncope 06/12/2016  . Malabsorption of iron 05/09/2016  . History of gastric bypass 05/09/2016  . Arthritis of knee, degenerative 04/14/2016  . Surgical menopause 03/24/2016  . Family history of breast cancer 03/24/2016  . Left knee pain 10/01/2015  . Hypoglycemia 06/12/2015  . Iron deficiency anemia 05/02/2015  . Cubital tunnel syndrome on right 09/14/2014  . Degenerative disc disease,  cervical 07/27/2014  . Prediabetes 09/01/2013  . Fibromyalgia 04/11/2013  . Essential hypertension, benign 04/11/2013  . Anxiety state 04/11/2013     Lanney Gins, PT, DPT 08/19/16 9:50 AM   [facility] [address] [place], [place], [address] Phone: [phone]   Fax:  [phone]  Name: TIMOTHEA BODENHEIMER MRN: [id number] Date of Birth: 04-Aug-1965

## 2016-08-19 NOTE — Patient Instructions (Signed)

## 2016-08-21 ENCOUNTER — Ambulatory Visit (INDEPENDENT_AMBULATORY_CARE_PROVIDER_SITE_OTHER): Payer: 59 | Admitting: Family Medicine

## 2016-08-21 ENCOUNTER — Encounter: Payer: Self-pay | Admitting: Family Medicine

## 2016-08-21 ENCOUNTER — Ambulatory Visit (INDEPENDENT_AMBULATORY_CARE_PROVIDER_SITE_OTHER): Payer: 59

## 2016-08-21 VITALS — BP 116/78 | HR 85 | Temp 99.0°F | Wt 197.0 lb

## 2016-08-21 DIAGNOSIS — R05 Cough: Secondary | ICD-10-CM

## 2016-08-21 DIAGNOSIS — I10 Essential (primary) hypertension: Secondary | ICD-10-CM

## 2016-08-21 DIAGNOSIS — R0989 Other specified symptoms and signs involving the circulatory and respiratory systems: Secondary | ICD-10-CM

## 2016-08-21 DIAGNOSIS — R059 Cough, unspecified: Secondary | ICD-10-CM

## 2016-08-21 MED ORDER — BENZONATATE 200 MG PO CAPS: 200.0000 mg | ORAL_CAPSULE | Freq: Three times a day (TID) | ORAL | 0 refills | Status: DC | PRN

## 2016-08-21 MED ORDER — GUAIFENESIN-CODEINE 100-10 MG/5ML PO SOLN: 5.0000 mL | Freq: Every evening | ORAL | 0 refills | Status: DC | PRN

## 2016-08-21 NOTE — Progress Notes (Signed)
Stacy Woods is a 51 y.o. female who presents to [facility] today for a cough lasting four days.  She developed a cold last week that progressed into a nonproductive cough that she describes as "gunky and congested" in her chest.  She has tried OTC cold meds and cough suppressant liquid but doesn't remember names and reports they were no help.  Her 5 month grandson was sick with a cold last week.  She denies SOB but thinks she might have been wheezing.  She denies fever, chills, or change in appetite. Hasn't been sleeping well but attributes this to her back pain not her cough.     Past Medical History:  Diagnosis Date  . Anemia   . Anemia, iron deficiency 05/02/2015  . Fibromyalgia   . History of gastric bypass 05/09/2016  . Hypertension   . Malabsorption of iron 05/09/2016  . PONV (postoperative nausea and vomiting)   . Surgical menopause 03/24/2016   Past Surgical History:  Procedure Laterality Date  . ABDOMINAL HYSTERECTOMY  1997  . APPENDECTOMY    . CESAREAN SECTION  [id number]  . CHOLECYSTECTOMY    . GASTRIC BYPASS  2011  . KNEE ARTHROSCOPY  2002  . TONSILLECTOMY  1977  . ULNAR NERVE TRANSPOSITION Right 06/12/2015   Procedure: RIGHT CUBITAL TUNNEL RELEASE;  Surgeon: Milly Jakob, MD;  Location: [facility];  Service: Orthopedics;  Laterality: Right;   Social History  Substance Use Topics  . Smoking status: Never Smoker  . Smokeless tobacco: Never Used  . Alcohol use No   family history includes Diabetes in her father; Heart failure in her father; Hypertension in her father; Stroke in her father.  ROS as above:  Medications: Current Outpatient Prescriptions  Medication Sig Dispense Refill  . AMBULATORY NON FORMULARY MEDICATION One Touch Glucometer per insurance formulary. One month of testing strips, and lancets used to check blood sugar  daily. 1 Units prn  . escitalopram (LEXAPRO) 10 MG tablet TAKE 1 TABLET (10 MG TOTAL) BY MOUTH DAILY. 90 tablet 3  . LORazepam (ATIVAN) 0.5 MG tablet Take 1-2 tabs 30-60 min prior to MRI 4 tablet 0  . benzonatate (TESSALON) 200 MG capsule Take 1 capsule (200 mg total) by mouth 3 (three) times daily as needed for cough. 45 capsule 0  . guaiFENesin-codeine 100-10 MG/5ML syrup Take 5 mLs by mouth at bedtime as needed for cough. 120 mL 0   No current facility-administered medications for this visit.    Allergies  Allergen Reactions  . Mobic [Meloxicam] Swelling  . Shellfish Allergy Nausea Only and Swelling  . Percocet [Oxycodone-Acetaminophen] Itching  . Lyrica [Pregabalin] Other (See Comments)    "zombie"    Health Maintenance Health Maintenance  Topic Date Due  . HIV Screening  02/26/1983  . TETANUS/TDAP  03/24/2026  . INFLUENZA VACCINE  Completed     Exam:  BP 116/78   Pulse 85   Temp 99 F (37.2 C) (Oral)   Wt 197 lb (89.4 kg)   SpO2 98%   BMI 33.81 kg/m  Gen: Well NAD HEENT: Clear nasal discharge and mildly inflamed nasal terminus normal tympanic membranes bilaterally. Lungs: Normal work of breathing. CTABL. no LAD Heart: Regular rate and rhythm no murmurs rubs or gallops Abdomen: Normal-appearing soft nontender NABS Extremities: Dry well perfused brisk capillary refill      Assessment and Plan: 51 y.o. female with a 4 day history of  cough and chest congestion following a cold.  Given her presentation and exposure, viral bronchitis is likely.  A CXR will be preformed in case of pneumonia and will FU with patient pending results.  Guaifenesin-codeine syrup and benzonatate for symptom control.    Orders Placed This Encounter  Procedures  . DG Chest 2 View    Order Specific Question:   Reason for exam:    Answer:   Cough, assess intra-thoracic pathology    Order Specific Question:   Is the patient pregnant?    Answer:   No    Order Specific Question:   Preferred  imaging location?    Answer:   [facility]   Meds ordered this encounter  Medications  . guaiFENesin-codeine 100-10 MG/5ML syrup    Sig: Take 5 mLs by mouth at bedtime as needed for cough.    Dispense:  120 mL    Refill:  0  . benzonatate (TESSALON) 200 MG capsule    Sig: Take 1 capsule (200 mg total) by mouth 3 (three) times daily as needed for cough.    Dispense:  45 capsule    Refill:  0     Discussed warning signs or symptoms. Please see discharge instructions. Patient expresses understanding.

## 2016-08-21 NOTE — Patient Instructions (Signed)
Thank you for coming in today. Get xray today.  Take tessalon during the day and codeine at night.  Take over the counter cough medicines as needed.  Let me know if you worsen to need prednisone. I will call in antibiotics if yo have pneumonia.    Call or go to the emergency room if you get worse, have trouble breathing, have chest pains, or palpitations.    Acute Bronchitis, Adult Acute bronchitis is when air tubes (bronchi) in the lungs suddenly get swollen. The condition can make it hard to breathe. It can also cause these symptoms:  A cough.  Coughing up clear, yellow, or green mucus.  Wheezing.  Chest congestion.  Shortness of breath.  A fever.  Body aches.  Chills.  A sore throat. Follow these instructions at home: Medicines   Take over-the-counter and prescription medicines only as told by your doctor.  If you were prescribed an antibiotic medicine, take it as told by your doctor. Do not stop taking the antibiotic even if you start to feel better. General instructions   Rest.  Drink enough fluids to keep your pee (urine) clear or pale yellow.  Avoid smoking and secondhand smoke. If you smoke and you need help quitting, ask your doctor. Quitting will help your lungs heal faster.  Use an inhaler, cool mist vaporizer, or humidifier as told by your doctor.  Keep all follow-up visits as told by your doctor. This is important. How is this prevented? To lower your risk of getting this condition again:  Wash your hands often with soap and water. If you cannot use soap and water, use hand sanitizer.  Avoid contact with people who have cold symptoms.  Try not to touch your hands to your mouth, nose, or eyes.  Make sure to get the flu shot every year. Contact a doctor if:  Your symptoms do not get better in 2 weeks. Get help right away if:  You cough up blood.  You have chest pain.  You have very bad shortness of breath.  You become dehydrated.  You  faint (pass out) or keep feeling like you are going to pass out.  You keep throwing up (vomiting).  You have a very bad headache.  Your fever or chills gets worse. This information is not intended to replace advice given to you by your health care provider. Make sure you discuss any questions you have with your health care provider. Document Released: 11/12/2007 Document Revised: 01/02/2016 Document Reviewed: 11/14/2015 Elsevier Interactive Patient Education  2017 [employer].

## 2016-08-26 ENCOUNTER — Ambulatory Visit: Payer: 59 | Admitting: Physical Therapy

## 2016-08-26 ENCOUNTER — Encounter: Payer: Self-pay | Admitting: Physical Therapy

## 2016-08-26 DIAGNOSIS — R262 Difficulty in walking, not elsewhere classified: Secondary | ICD-10-CM | POA: Diagnosis not present

## 2016-08-26 DIAGNOSIS — M5442 Lumbago with sciatica, left side: Secondary | ICD-10-CM

## 2016-08-26 NOTE — Therapy (Signed)
[facility] [place], [place], [address] Phone: [phone]   Fax:  [phone]  Physical Therapy Treatment  Patient Details  Name: Stacy Woods MRN: [id number] Date of Birth: Oct 16, 1965 Referring Provider: Lynne Leader  Encounter Date: 08/26/2016      PT End of Session - 08/26/16 0921    Visit Number 3   Date for PT Re-Evaluation 10/12/16   PT Start Time 0842   PT Stop Time 0937   PT Time Calculation (min) 55 min   Activity Tolerance Patient tolerated treatment well   Behavior During Therapy [facility] for tasks assessed/performed      Past Medical History:  Diagnosis Date  . Anemia   . Anemia, iron deficiency 05/02/2015  . Fibromyalgia   . History of gastric bypass 05/09/2016  . Hypertension   . Malabsorption of iron 05/09/2016  . PONV (postoperative nausea and vomiting)   . Surgical menopause 03/24/2016    Past Surgical History:  Procedure Laterality Date  . ABDOMINAL HYSTERECTOMY  1997  . APPENDECTOMY    . CESAREAN SECTION  [id number]  . CHOLECYSTECTOMY    . GASTRIC BYPASS  2011  . KNEE ARTHROSCOPY  2002  . TONSILLECTOMY  1977  . ULNAR NERVE TRANSPOSITION Right 06/12/2015   Procedure: RIGHT CUBITAL TUNNEL RELEASE;  Surgeon: Milly Jakob, MD;  Location: [facility];  Service: Orthopedics;  Laterality: Right;    There were no vitals filed for this visit.      Subjective Assessment - 08/26/16 0909    Subjective Patient reports that the radiologist did not think it was the hip.  Thinks it may be from the back   Currently in Pain? Yes   Pain Score 5    Pain Location Back   Pain Orientation Left   Pain Radiating Towards in left buttock, hip and anterior thigh                         [facility] Adult PT Treatment/Exercise - 08/26/16 0001      Lumbar Exercises: Stretches   Single Knee to Chest Stretch 3 reps;30 seconds   Single Knee to Chest Stretch Limitations L      Lumbar Exercises: Supine   Ab Set 15 reps;2 seconds   Straight Leg Raise 20 reps;2 seconds   Straight Leg Raises Limitations holding pelvic neutral, has slight increase pain with posterior pelvic tilt     Knee/Hip Exercises: Stretches   Passive Hamstring Stretch Left;3 reps;30 seconds   Quad Stretch Left     Modalities   Modalities Traction     Moist Heat Therapy   Number Minutes Moist Heat 15 Minutes   Moist Heat Location Lumbar Spine     Electrical Stimulation   Electrical Stimulation Location left lumbar and into buttock   Electrical Stimulation Action IFC   Electrical Stimulation Parameters supine   Electrical Stimulation Goals Pain     Traction   Type of Traction Lumbar   Min (lbs) 62   Hold Time static   Time 15     Manual Therapy   Manual Therapy Joint mobilization   Joint Mobilization used belt to distract the hip and do small motions just in case it is hip impingement                  PT Short Term Goals - 08/26/16 0923      PT SHORT TERM GOAL #  1   Title independent with initial HEP   Status Achieved           PT Long Term Goals - 08/12/16 1519      PT LONG TERM GOAL #1   Title understand posture at work and how to manage back issues and posture   Time 8   Period Weeks   Status New     PT LONG TERM GOAL #2   Title increased  ROM to WFL's  painfree    Time 8   Period Weeks   Status New     PT LONG TERM GOAL #3   Title Patient to tolerate sitting; standing; walking for 15 min without pain    Time 8   Period Weeks   Status New     PT LONG TERM GOAL #4   Title report no issues with sleeping   Time 8   Period Weeks     PT LONG TERM GOAL #5   Title no falls over a 6 week period   Time [place] - 08/26/16 1423    Clinical Impression Statement Patient with left low back and hip pain, we tried traction to see if this would alleviate any of her symptoms.  she tolerated it well but  went fairly light with the traction pull.   PT Next Visit Plan see if traction offered any relief   Consulted and Agree with Plan of Care Patient      Patient will benefit from skilled therapeutic intervention in order to improve the following deficits and impairments:  Abnormal gait, Decreased range of motion, Decreased strength, Difficulty walking, Increased fascial restricitons, Increased muscle spasms, Impaired flexibility, Postural dysfunction, Improper body mechanics, Pain  Visit Diagnosis: Acute left-sided low back pain with left-sided sciatica  Difficulty in walking, not elsewhere classified     Problem List Patient Active Problem List   Diagnosis Date Noted  . Left thigh pain 08/18/2016  . Syncope 06/12/2016  . Malabsorption of iron 05/09/2016  . History of gastric bypass 05/09/2016  . Arthritis of knee, degenerative 04/14/2016  . Surgical menopause 03/24/2016  . Family history of breast cancer 03/24/2016  . Left knee pain 10/01/2015  . Hypoglycemia 06/12/2015  . Iron deficiency anemia 05/02/2015  . Cubital tunnel syndrome on right 09/14/2014  . Degenerative disc disease, cervical 07/27/2014  . Prediabetes 09/01/2013  . Fibromyalgia 04/11/2013  . Essential hypertension, benign 04/11/2013  . Anxiety state 04/11/2013    Sumner Boast., PT 08/26/2016, 9:25 AM  [facility] [address] [place], [place], [address] Phone: [phone]   Fax:  [phone]  Name: Stacy Woods MRN: [id number] Date of Birth: [birth year]

## 2016-09-02 ENCOUNTER — Encounter: Payer: Self-pay | Admitting: Family Medicine

## 2016-09-02 ENCOUNTER — Encounter: Payer: Self-pay | Admitting: Physical Therapy

## 2016-09-02 ENCOUNTER — Ambulatory Visit: Payer: 59 | Admitting: Physical Therapy

## 2016-09-02 DIAGNOSIS — M5442 Lumbago with sciatica, left side: Secondary | ICD-10-CM

## 2016-09-02 DIAGNOSIS — R262 Difficulty in walking, not elsewhere classified: Secondary | ICD-10-CM | POA: Diagnosis not present

## 2016-09-02 NOTE — Therapy (Signed)
[facility] [place], [place], [address] Phone: [phone]   Fax:  [phone]  Physical Therapy Treatment  Patient Details  Name: Stacy Woods MRN: [id number] Date of Birth: 10-15-1965 Referring Provider: Lynne Leader  Encounter Date: 09/02/2016      PT End of Session - 09/02/16 1515    Visit Number 4   Date for PT Re-Evaluation 10/12/16   PT Start Time 1432   PT Stop Time 1521   PT Time Calculation (min) 49 min   Activity Tolerance Patient limited by pain   Behavior During Therapy [facility] for tasks assessed/performed      Past Medical History:  Diagnosis Date  . Anemia   . Anemia, iron deficiency 05/02/2015  . Fibromyalgia   . History of gastric bypass 05/09/2016  . Hypertension   . Malabsorption of iron 05/09/2016  . PONV (postoperative nausea and vomiting)   . Surgical menopause 03/24/2016    Past Surgical History:  Procedure Laterality Date  . ABDOMINAL HYSTERECTOMY  1997  . APPENDECTOMY    . CESAREAN SECTION  [id number]  . CHOLECYSTECTOMY    . GASTRIC BYPASS  2011  . KNEE ARTHROSCOPY  2002  . TONSILLECTOMY  1977  . ULNAR NERVE TRANSPOSITION Right 06/12/2015   Procedure: RIGHT CUBITAL TUNNEL RELEASE;  Surgeon: Milly Jakob, MD;  Location: [facility];  Service: Orthopedics;  Laterality: Right;    There were no vitals filed for this visit.      Subjective Assessment - 09/02/16 1508    Subjective Patient reports much more pain after the traction last visit.  Reports that the only time she has less pain is with lying flat on a hard surface.  Reports lying in tanning bed.   Currently in Pain? Yes   Pain Score 6    Pain Location Back   Pain Orientation Left   Pain Descriptors / Indicators Burning;Spasm;Sore                         [facility] Adult PT Treatment/Exercise - 09/02/16 0001      Modalities   Modalities Ultrasound     Moist Heat Therapy   Number  Minutes Moist Heat 15 Minutes   Moist Heat Location Lumbar Spine     Electrical Stimulation   Electrical Stimulation Location left lumbar and into buttock   Electrical Stimulation Action IFC   Electrical Stimulation Parameters supine    Electrical Stimulation Goals Pain     Ultrasound   Ultrasound Location left lumbar paraspinals   Ultrasound Parameters 100% 1.5 w/cm2   Ultrasound Goals Pain     Manual Therapy   Manual Therapy Soft tissue mobilization   Soft tissue mobilization use of vibration to the left lumbar parapsinals and intothe buttocks                  PT Short Term Goals - 08/26/16 7867      PT SHORT TERM GOAL #1   Title independent with initial HEP   Status Achieved           PT Long Term Goals - 09/02/16 1517      PT LONG TERM GOAL #1   Title understand posture at work and how to manage back issues and posture   Status Partially Met               Plan - 09/02/16 1515  Clinical Impression Statement Patient with significant spasms in the left lumbar area, she is tender and tight in the left ITB and buttock, she does not tolerate stretches as these increase her pain, the traction last visit really increased her pain as well.  Her being in sidelying for the [place] today caused increased pain, she reports that hte only relief is supine on a flat surface.   PT Next Visit Plan may have her see her MD again   Consulted and Agree with Plan of Care Patient      Patient will benefit from skilled therapeutic intervention in order to improve the following deficits and impairments:  Abnormal gait, Decreased range of motion, Decreased strength, Difficulty walking, Increased fascial restricitons, Increased muscle spasms, Impaired flexibility, Postural dysfunction, Improper body mechanics, Pain  Visit Diagnosis: Acute left-sided low back pain with left-sided sciatica  Difficulty in walking, not elsewhere classified     Problem List Patient Active  Problem List   Diagnosis Date Noted  . Left thigh pain 08/18/2016  . Syncope 06/12/2016  . Malabsorption of iron 05/09/2016  . History of gastric bypass 05/09/2016  . Arthritis of knee, degenerative 04/14/2016  . Surgical menopause 03/24/2016  . Family history of breast cancer 03/24/2016  . Left knee pain 10/01/2015  . Hypoglycemia 06/12/2015  . Iron deficiency anemia 05/02/2015  . Cubital tunnel syndrome on right 09/14/2014  . Degenerative disc disease, cervical 07/27/2014  . Prediabetes 09/01/2013  . Fibromyalgia 04/11/2013  . Essential hypertension, benign 04/11/2013  . Anxiety state 04/11/2013    Sumner Boast., PT 09/02/2016, 3:18 PM  [address] [place], [place], [address] Phone: [phone]   Fax:  [phone]  Name: Stacy Woods MRN: [id number] Date of Birth: 1965-12-28

## 2016-09-08 ENCOUNTER — Ambulatory Visit: Payer: 59 | Admitting: Family Medicine

## 2016-09-11 ENCOUNTER — Ambulatory Visit: Payer: 59 | Admitting: Hematology & Oncology

## 2016-09-11 ENCOUNTER — Other Ambulatory Visit: Payer: 59

## 2016-09-26 ENCOUNTER — Ambulatory Visit (INDEPENDENT_AMBULATORY_CARE_PROVIDER_SITE_OTHER): Payer: 59 | Admitting: Family Medicine

## 2016-09-26 ENCOUNTER — Ambulatory Visit (INDEPENDENT_AMBULATORY_CARE_PROVIDER_SITE_OTHER): Payer: 59

## 2016-09-26 ENCOUNTER — Encounter: Payer: Self-pay | Admitting: Family Medicine

## 2016-09-26 VITALS — BP 115/77 | HR 72

## 2016-09-26 DIAGNOSIS — S8991XA Unspecified injury of right lower leg, initial encounter: Secondary | ICD-10-CM | POA: Diagnosis not present

## 2016-09-26 DIAGNOSIS — M1712 Unilateral primary osteoarthritis, left knee: Secondary | ICD-10-CM | POA: Diagnosis not present

## 2016-09-26 DIAGNOSIS — M25562 Pain in left knee: Secondary | ICD-10-CM

## 2016-09-26 DIAGNOSIS — M7042 Prepatellar bursitis, left knee: Secondary | ICD-10-CM

## 2016-09-26 DIAGNOSIS — M25561 Pain in right knee: Secondary | ICD-10-CM

## 2016-09-26 MED ORDER — DOXYCYCLINE HYCLATE 100 MG PO TABS: 100.0000 mg | ORAL_TABLET | Freq: Two times a day (BID) | ORAL | 0 refills | Status: DC

## 2016-09-26 MED ORDER — DICLOFENAC SODIUM 1 % TD GEL: 4.0000 g | Freq: Four times a day (QID) | TRANSDERMAL | 11 refills | Status: DC

## 2016-09-26 MED ORDER — MUPIROCIN 2 % EX OINT: TOPICAL_OINTMENT | CUTANEOUS | 3 refills | Status: DC

## 2016-09-26 NOTE — Patient Instructions (Signed)
Thank you for coming in today. Apply ice. A bag of frozen peas works well.  Apply the voltaren gel as well ad bactroban ointment.  Take oral doxycycline antibiotics as a prevention of infection.   Recheck in 1-2 weeks.   Take is easy.    Prepatellar Bursitis Prepatellar bursitis is inflammation of the prepatellar bursa. The prepatellar bursa is a fluid-filled sac that cushions the kneecap (patella). Prepatellar bursitis happens when fluid builds up in the this sac and causes it to get larger. The condition causes knee pain. What are the causes? This condition may be caused by:  Constant pressure on the knees from kneeling.  A hit to the knee.  Falling on the knee.  A bacterial infection.  Moving the knee often in a forceful way. What increases the risk? This condition is more likely to develop in:  People who play a sport that involves a risk for falls on the knee or hard hits (blows) to the knee. These sports include:  Football.  Wrestling.  Basketball.  Soccer.  People who have to kneel for long periods of time, such as roofers, plumbers, and gardeners.  People with another inflammatory condition, such as gout or rheumatoid arthritis. What are the signs or symptoms? The most common symptom of this condition is knee pain that gets better with rest. Other symptoms include:  Swelling on the front of the kneecap.  Warmth in the knee.  Tenderness with activity.  Redness in the knee.  Inability to bend the knee or to kneel. How is this diagnosed? This condition may be diagnosed based on:  Your symptoms.  Your medical history.  A physical exam. During the exam, your provider will compare your knees and check for tenderness and pain when moving your knee. Your health care provider may also use a needle to remove fluid from the bursa to help diagnose an infection.  Tests, such as:  A blood test that checks for infection.  X-rays. These may be taken to check  the structure of the patella.  MRI or ultrasound. These may be done to check for swelling and fluid buildup in the bursa. How is this treated? This condition may be treated by:  Resting the knee.  Applying ice to the knee.  Medicine, such as:  Nonsteroidal anti-inflammatory drugs (NSAIDs). These can help to reduce pain and swelling.  Antibiotic medicines. These may be needed if you have an infection.  Steroid medicines. These may be prescribed if other treatments are not helping.  Raising (elevating) the knee while resting.  Doing strengthening and stretching exercises (physical therapy). These may be recommended after pain and swelling improve.  Having a procedure to remove fluid from the bursa. This may be done if other treatment is not helping.  Having surgery to remove the bursa. This may be done if you have a severe infection or if the condition keeps coming back after treatment. Follow these instructions at home: Medicines   Take over-the-counter and prescription medicines only as told by your health care provider.  If you were prescribed an antibiotic medicine, take it as told by your health care provider. Do not stop taking the antibiotic even if you start to feel better. Managing pain, stiffness, and swelling   If directed, apply ice to your knee.  Put ice in a plastic bag.  Place a towel between your skin and the bag.  Leave the ice on for 20 minutes, 2-3 times a day.  Elevate your knee above  the level of your heart while you are sitting or lying down. Driving   Do not drive or operate heavy machinery while taking prescription pain medicine.  Ask your health care provider when it is safe fpr you to drive. Activity   Rest your knee.  Avoid activities that cause pain.  Return to your normal activities as told by your health care provider. Ask your health care provider what activities are safe for you.  Do exercises as told by your health care  provider. General instructions   Do not use the injured limb to support your body weight until your health care provider says that you can.  Do not use any tobacco products, such as cigarettes, chewing tobacco, and e-cigarettes. Tobacco can delay bone healing. If you need help quitting, ask your health care provider.  Keep all follow-up visits as told by your health care provider. This is important. How is this prevented?  Warm up and stretch before being active.  Cool down and stretch after being active.  Give your body time to rest between periods of activity.  Make sure to use equipment that fits you.  Be safe and responsible while being active to avoid falls.  Do at least 150 minutes of moderate-intensity exercise each week, such as brisk walking or water aerobics.  Maintain physical fitness, including:  Strength.  Flexibility.  Cardiovascular fitness.  Endurance. Contact a health care provider if:  Your symptoms do not improve.  Your symptoms get worse.  Your symptoms keep coming back after treatment.  You develop a fever and have warmth, redness, and swelling over your knee. This information is not intended to replace advice given to you by your health care provider. Make sure you discuss any questions you have with your health care provider. Document Released: 05/26/2005 Document Revised: 01/29/2016 Document Reviewed: 02/23/2015 Elsevier Interactive Patient Education  2017 [employer] Ask your health care provider which exercises are safe for you. Do exercises exactly as told by your health care provider and adjust them as directed. It is normal to feel mild stretching, pulling, tightness, or discomfort as you do these exercises, but you should stop right away if you feel sudden pain or your pain gets worse.Do not begin these exercises until told by your health care provider. Stretching and range of motion exercises These  exercises warm up your muscles and joints and improve the movement and flexibility of your knee. These exercises also help to relieve pain, numbness, and tingling. Exercise A: Hamstring, standing   1. Stand with your __________ foot resting on a chair. Your __________ leg should be fully extended. 2. Arch your lower back slightly. 3. Leading with your chest, lean forward at the waist until you feel a gentle stretch in the back of your __________ knee or in your thigh. You should not need to lean far to feel a stretch. 4. Hold this position for __________ seconds. Repeat __________ times. Complete this stretch __________ times a day. Exercise B: Knee flexion, active heel slides  1. Lie on your back with both knees straight. If this causes back discomfort, bend your __________ knee so your foot is flat on the floor. 2. Slowly slide your __________ heel back toward your buttocks until you feel a gentle stretch in the front of your knee or thigh. 3. Hold this position for __________ seconds. 4. Slowly slide your __________ heel back to the starting position. Repeat __________ times. Complete this stretch __________ times  a day. Strengthening exercises These exercises build strength and endurance in your knee. Endurance is the ability to use your muscles for a long time, even after they get tired. Exercise C: Quadriceps, isometric   1. Lie on your back with your __________ leg extended and your __________ knee bent. 2. Slowly tense the muscles in the front of your __________ thigh. When you do this, you should see your kneecap slide up toward your hip or see increased dimpling just above the knee. This motion will push the back of your knee down toward the surface that is under it. 3. For __________ seconds, keep the muscle as tight as you can without increasing your pain. 4. Relax the muscles slowly and completely. Repeat __________ times. Complete this exercise __________ times a day. Exercise D:  Straight leg raises (  quadriceps) 1. Lie on your back with your __________ leg extended and your __________ knee bent. 2. Slowly tense the muscles in your __________ thigh. When you do this, you should see your kneecap slide up toward your hip or see increased dimpling just above the knee. 3. Keep these muscles tight as you raise your leg 4-6 inches (10-15 cm) off the floor. 4. Hold this position for __________ seconds. 5. Keep these muscles tense as you lower your leg slowly. 6. Relax your muscles slowly and completely. Repeat __________ times. Complete this exercise __________ times a day. Exercise E: Straight leg raises ( hip extensors) 1. Lie on your abdomen on a bed or a firm surface. You can put a pillow under your hips if that is more comfortable. 2. Tense your buttock muscles and lift your __________ thigh. Your __________ knee can be bent or straight, but do not let your back arch. 3. Hold this position for __________ seconds. 4. Slowly lower your leg to the starting position. 5. Let your muscles relax completely. Repeat __________ times. Complete this exercise __________ times a day. This information is not intended to replace advice given to you by your health care provider. Make sure you discuss any questions you have with your health care provider. Document Released: 05/26/2005 Document Revised: 01/29/2016 Document Reviewed: 02/23/2015 Elsevier Interactive Patient Education  2017 [employer].

## 2016-09-26 NOTE — Progress Notes (Signed)
Stacy Woods is a 51 y.o. female who presents to [facility] today for left knee injury. Stacy Woods's  horse was attacked by a pack of coyotes last night injuring the horse. She was attending to her  Horse this morning when the horse moved and caused her to twist her knee and fell to the ground. She notes injury to the left anterior knee with pain and swelling. She has not tried any treatment yet. She denies fevers or chills.   Past Medical History:  Diagnosis Date  . Anemia   . Anemia, iron deficiency 05/02/2015  . Fibromyalgia   . History of gastric bypass 05/09/2016  . Hypertension   . Malabsorption of iron 05/09/2016  . PONV (postoperative nausea and vomiting)   . Surgical menopause 03/24/2016   Past Surgical History:  Procedure Laterality Date  . ABDOMINAL HYSTERECTOMY  1997  . APPENDECTOMY    . CESAREAN SECTION  [id number]  . CHOLECYSTECTOMY    . GASTRIC BYPASS  2011  . KNEE ARTHROSCOPY  2002  . TONSILLECTOMY  1977  . ULNAR NERVE TRANSPOSITION Right 06/12/2015   Procedure: RIGHT CUBITAL TUNNEL RELEASE;  Surgeon: Stacy Jakob, MD;  Location: [facility];  Service: Orthopedics;  Laterality: Right;   Social History  Substance Use Topics  . Smoking status: Never Smoker  . Smokeless tobacco: Never Used  . Alcohol use No     ROS:  As above   Medications: Current Outpatient Prescriptions  Medication Sig Dispense Refill  . AMBULATORY NON FORMULARY MEDICATION One Touch Glucometer per insurance formulary. One month of testing strips, and lancets used to check blood sugar daily. 1 Units prn  . diclofenac sodium (VOLTAREN) 1 % GEL Apply 4 g topically 4 (four) times daily. To affected joint. 100 g 11  . doxycycline (VIBRA-TABS) 100 MG tablet Take 1 tablet (100 mg total) by mouth 2 (two) times daily. 14 tablet 0  . escitalopram (LEXAPRO) 10 MG tablet TAKE 1 TABLET (10 MG TOTAL) BY MOUTH DAILY. 90 tablet 3  . mupirocin  ointment (BACTROBAN) 2 % Apply to affected area TID for 7 days. 30 g 3   No current facility-administered medications for this visit.    Allergies  Allergen Reactions  . Mobic [Meloxicam] Swelling  . Shellfish Allergy Nausea Only and Swelling  . Percocet [Oxycodone-Acetaminophen] Itching  . Lyrica [Pregabalin] Other (See Comments)    "zombie"     Exam:  BP 115/77   Pulse 72  General: Well Developed, well nourished, and in no acute distress.  Neuro/Psych: Alert and oriented x3, extra-ocular muscles intact, able to move all 4 extremities, sensation grossly intact. Skin: Warm and dry, no rashes noted.  Respiratory: Not using accessory muscles, speaking in full sentences, trachea midline.  Cardiovascular: Pulses palpable, no extremity edema. Abdomen: Does not appear distended. MSK: left knee.   Abrasion to the anterior knee overlying the patella tendon associated with a fluctuant swollen area. This is tender to touch. The medial knee is mildly tender at the joint line. The lateral knee is nontender. Patient guards with knee exam.    No results found for this or any previous visit (from the past 36 hour(s)). Dg Knee 1-2 Views Right  Result Date: 09/26/2016 CLINICAL DATA:  Bilateral knee pain after fall. EXAM: RIGHT KNEE - 1-2 VIEW COMPARISON:  Radiographs of October 01, 2015. FINDINGS: No evidence of fracture, dislocation, or joint effusion. No evidence of arthropathy or other focal bone abnormality. Soft  tissues are unremarkable. IMPRESSION: Normal right knee. Electronically Signed   By: Stacy Woods, M.D.   On: 09/26/2016 10:26   Dg Knee Complete 4 Views Left  Result Date: 09/26/2016 CLINICAL DATA:  Acute left knee pain. EXAM: LEFT KNEE - COMPLETE 4+ VIEW COMPARISON:  Radiographs of May 26, 2016. FINDINGS: No evidence of fracture, dislocation, or joint effusion. No significant joint space narrowing is noted, although minimal osteophyte formation is noted laterally. Soft tissues  are unremarkable. IMPRESSION: Minimal degenerative changes are noted as described above. No acute abnormality seen in the left knee. Electronically Signed   By: Stacy Woods, M.D.   On: 09/26/2016 10:30      Assessment and Plan: 51 y.o. female with left knee injury with prepatellar bursitis and possible mensicus injury.  Xray is unremarkable.   Plan to treat the abrasion with bactroban ointment and prophylactic oral doxycycline.  With treat pain and traumatic bursitis with topical voltaren gel and ice.  Recheck in 2 weeks or so.       Orders Placed This Encounter  Procedures  . DG Knee 1-2 Views Right    Standing Status:   Future    Number of Occurrences:   1    Standing Expiration Date:   11/27/2017    Order Specific Question:   Reason for Exam (SYMPTOM  OR DIAGNOSIS REQUIRED)    Answer:   For use with the left knee x-ray bilateral AP and Rosenberg standing.    Order Specific Question:   Is the patient pregnant?    Answer:   No    Order Specific Question:   Preferred imaging location?    Answer:   [facility]  . DG Knee Complete 4 Views Left    Please include patellar sunrise, lateral, and weightbearing bilateral AP and bilateral rosenberg views    Standing Status:   Future    Number of Occurrences:   1    Standing Expiration Date:   11/26/2017    Order Specific Question:   Reason for exam:    Answer:   Please include patellar sunrise, lateral, and weightbearing bilateral AP and bilateral rosenberg views    Comments:   Please include patellar sunrise, lateral, and weightbearing bilateral AP and bilateral rosenberg views    Order Specific Question:   Preferred imaging location?    Answer:   [facility]    Discussed warning signs or symptoms. Please see discharge instructions. Patient expresses understanding.

## 2016-10-08 ENCOUNTER — Encounter: Payer: Self-pay | Admitting: Family Medicine

## 2016-10-08 ENCOUNTER — Ambulatory Visit (INDEPENDENT_AMBULATORY_CARE_PROVIDER_SITE_OTHER): Payer: 59 | Admitting: Family Medicine

## 2016-10-08 VITALS — BP 103/65 | HR 74 | Wt 199.0 lb

## 2016-10-08 DIAGNOSIS — M79652 Pain in left thigh: Secondary | ICD-10-CM | POA: Diagnosis not present

## 2016-10-08 DIAGNOSIS — M7062 Trochanteric bursitis, left hip: Secondary | ICD-10-CM

## 2016-10-08 NOTE — Patient Instructions (Signed)
Thank you for coming in today. Call or go to the ER if you develop a large red swollen joint with extreme pain or oozing puss.  Recheck in 1 month.    Hip Bursitis Hip bursitis is inflammation of a fluid-filled sac (bursa) in the hip joint. The bursa protects the bones in the hip joint from rubbing against each other. Hip bursitis can cause mild to moderate pain, and symptoms often come and go over time. What are the causes? This condition may be caused by:  Injury to the hip.  Overuse of the muscles that surround the hip joint.  Arthritis or gout.  Diabetes.  Thyroid disease.  Cold weather.  Infection. In some cases, the cause may not be known. What are the signs or symptoms? Symptoms of this condition may include:  Mild or moderate pain in the hip area. Pain may get worse with movement.  Tenderness and swelling of the hip, especially on the outer side of the hip. Symptoms may come and go. If the bursa becomes infected, you may have the following symptoms:  Fever.  Red skin and a feeling of warmth in the hip area. How is this diagnosed? This condition may be diagnosed based on:  A physical exam.  Your medical history.  X-rays.  Removal of fluid from your inflamed bursa for testing (biopsy). You may be sent to a health care provider who specializes in bone diseases (orthopedist) or a provider who specializes in joint inflammation (rheumatologist). How is this treated? This condition is treated by resting, raising (elevating), and applying pressure(compression) to the injured area. In some cases, this may be enough to make your symptoms go away. Treatment may also include:  Crutches.  Antibiotic medicine.  Draining fluid out of the bursa to help relieve swelling.  Injecting medicine that helps to reduce inflammation (cortisone). Follow these instructions at home: Medicines   Take over-the-counter and prescription medicines only as told by your health care  provider.  Do not drive or operate heavy machinery while taking prescription pain medicine, or as told by your health care provider.  If you were prescribed an antibiotic, take it as told by your health care provider. Do not stop taking the antibiotic even if you start to feel better. Activity   Return to your normal activities as told by your health care provider. Ask your health care provider what activities are safe for you.  Rest and protect your hip as much as possible until your pain and swelling get better. General instructions   Wear compression wraps only as told by your health care provider.  Elevate your hip above the level of your heart as much as you can without pain. To do this, try putting a pillow under your hips while you lie down.  Do not use your hip to support your body weight until your health care provider says that you can. Use crutches as told by your health care provider.  Gently massage and stretch your injured area as often as is comfortable.  Keep all follow-up visits as told by your health care provider. This is important. How is this prevented?  Exercise regularly, as told by your health care provider.  Warm up and stretch before being active.  Cool down and stretch after being active.  If an activity irritates your hip or causes pain, avoid the activity as much as possible.  Avoid sitting down for long periods at a time. Contact a health care provider if:  You have  a fever.  You develop new symptoms.  You have difficulty walking or doing everyday activities.  You have pain that gets worse or does not get better with medicine.  You develop red skin or a feeling of warmth in your hip area. Get help right away if:  You cannot move your hip.  You have severe pain. This information is not intended to replace advice given to you by your health care provider. Make sure you discuss any questions you have with your health care provider. Document  Released: 11/15/2001 Document Revised: 11/01/2015 Document Reviewed: 12/26/2014 Elsevier Interactive Patient Education  2017 [employer].

## 2016-10-08 NOTE — Progress Notes (Signed)
Stacy Woods is a 51 y.o. female who presents to [facility] today for left lateral hip pain. Patient is been seen several times for left leg pain initially thought to be lumbar radiculopathy. A lumbar MRI was unrevealing. She failed to improve with physical therapy. She notes continued pain in the lateral hip especially with weightbearing and standing from a seated position as well as when laying on her left side. She denies fevers or chills.    Past Medical History:  Diagnosis Date  . Anemia   . Anemia, iron deficiency 05/02/2015  . Fibromyalgia   . History of gastric bypass 05/09/2016  . Hypertension   . Malabsorption of iron 05/09/2016  . PONV (postoperative nausea and vomiting)   . Surgical menopause 03/24/2016   Past Surgical History:  Procedure Laterality Date  . ABDOMINAL HYSTERECTOMY  1997  . APPENDECTOMY    . CESAREAN SECTION  [id number]  . CHOLECYSTECTOMY    . GASTRIC BYPASS  2011  . KNEE ARTHROSCOPY  2002  . TONSILLECTOMY  1977  . ULNAR NERVE TRANSPOSITION Right 06/12/2015   Procedure: RIGHT CUBITAL TUNNEL RELEASE;  Surgeon: Milly Jakob, MD;  Location: [facility];  Service: Orthopedics;  Laterality: Right;   Social History  Substance Use Topics  . Smoking status: Never Smoker  . Smokeless tobacco: Never Used  . Alcohol use No     ROS:  As above   Medications: Current Outpatient Prescriptions  Medication Sig Dispense Refill  . AMBULATORY NON FORMULARY MEDICATION One Touch Glucometer per insurance formulary. One month of testing strips, and lancets used to check blood sugar daily. 1 Units prn  . diclofenac sodium (VOLTAREN) 1 % GEL Apply 4 g topically 4 (four) times daily. To affected joint. 100 g 11  . escitalopram (LEXAPRO) 10 MG tablet TAKE 1 TABLET (10 MG TOTAL) BY MOUTH DAILY. 90 tablet 3   No current facility-administered medications for this visit.    Allergies  Allergen Reactions  . Mobic  [Meloxicam] Swelling  . Shellfish Allergy Nausea Only and Swelling  . Percocet [Oxycodone-Acetaminophen] Itching  . Lyrica [Pregabalin] Other (See Comments)    "zombie"     Exam:  BP 103/65   Pulse 74   Wt 199 lb (90.3 kg)   BMI 34.16 kg/m  General: Well Developed, well nourished, and in no acute distress.  Neuro/Psych: Alert and oriented x3, extra-ocular muscles intact, able to move all 4 extremities, sensation grossly intact. Skin: Warm and dry, no rashes noted.  Respiratory: Not using accessory muscles, speaking in full sentences, trachea midline.  Cardiovascular: Pulses palpable, no extremity edema. Abdomen: Does not appear distended. MSK: Left hip normal motion tender palpation of the greater trochanter especially along the gluteus medius tendon insertion. Hip abduction strength is diminished and painful.  Hip greater trochanteric injection: left Consent obtained and timeout performed. Area of maximum tenderness palpated and identified. Skin cleaned with alcohol, cold spray applied. A spinale needle was used to access the greater trochanteric bursa. 80 mg of kenalog, and 4 mL of Marcaine were used to inject the trochanteric bursa. Patient tolerated the procedure well.    No results found for this or any previous visit (from the past 48 hour(s)). No results found.    Assessment and Plan: 51 y.o. female with lateral hip pain very likely due to greater trochanter pain syndrome or bursitis. Injected today. Plan on home exercise program and recheck in about a month or 2.  No orders of the defined types were placed in this encounter.   Discussed warning signs or symptoms. Please see discharge instructions. Patient expresses understanding.

## 2016-10-30 MED FILL — ESCITALOPRAM 10 MG TABLET: 10 | 90 days supply | Qty: 90 | Fill #2

## 2016-11-05 ENCOUNTER — Ambulatory Visit (INDEPENDENT_AMBULATORY_CARE_PROVIDER_SITE_OTHER): Payer: 59 | Admitting: Family Medicine

## 2016-11-05 ENCOUNTER — Encounter: Payer: Self-pay | Admitting: Family Medicine

## 2016-11-05 VITALS — BP 122/85 | HR 68 | Wt 198.0 lb

## 2016-11-05 DIAGNOSIS — M7062 Trochanteric bursitis, left hip: Secondary | ICD-10-CM | POA: Diagnosis not present

## 2016-11-05 DIAGNOSIS — M79652 Pain in left thigh: Secondary | ICD-10-CM | POA: Diagnosis not present

## 2016-11-05 NOTE — Progress Notes (Signed)
Stacy Woods is a 51 y.o. female who presents to [facility] today for follow-up back and leg pain. Patient has pain in the left lateral hip and buttocks radiating to the anterior thigh to the medial anterior tibia. This is been ongoing for some time. She had an MRI that did not show conclusive impingement. Additionally she's had a partial trial of physical therapy and a recent trial of unguided left greater trochanter injection. None of this has worked very well. She notes continued pain especially worse with standing. She denies numbness or bowel bladder dysfunction.   Past Medical History:  Diagnosis Date  . Anemia   . Anemia, iron deficiency 05/02/2015  . Fibromyalgia   . History of gastric bypass 05/09/2016  . Hypertension   . Malabsorption of iron 05/09/2016  . PONV (postoperative nausea and vomiting)   . Surgical menopause 03/24/2016   Past Surgical History:  Procedure Laterality Date  . ABDOMINAL HYSTERECTOMY  1997  . APPENDECTOMY    . CESAREAN SECTION  [id number]  . CHOLECYSTECTOMY    . GASTRIC BYPASS  2011  . KNEE ARTHROSCOPY  2002  . TONSILLECTOMY  1977  . ULNAR NERVE TRANSPOSITION Right 06/12/2015   Procedure: RIGHT CUBITAL TUNNEL RELEASE;  Surgeon: Milly Jakob, MD;  Location: [facility];  Service: Orthopedics;  Laterality: Right;   Social History  Substance Use Topics  . Smoking status: Never Smoker  . Smokeless tobacco: Never Used  . Alcohol use No     ROS:  As above   Medications: Current Outpatient Prescriptions  Medication Sig Dispense Refill  . AMBULATORY NON FORMULARY MEDICATION One Touch Glucometer per insurance formulary. One month of testing strips, and lancets used to check blood sugar daily. 1 Units prn  . diclofenac sodium (VOLTAREN) 1 % GEL Apply 4 g topically 4 (four) times daily. To affected joint. 100 g 11  . escitalopram (LEXAPRO) 10 MG tablet TAKE 1 TABLET (10 MG TOTAL) BY MOUTH  DAILY. 90 tablet 3   No current facility-administered medications for this visit.    Allergies  Allergen Reactions  . Mobic [Meloxicam] Swelling  . Shellfish Allergy Nausea Only and Swelling  . Percocet [Oxycodone-Acetaminophen] Itching  . Lyrica [Pregabalin] Other (See Comments)    "zombie"     Exam:  BP 122/85   Pulse 68   Wt 198 lb (89.8 kg)   BMI 33.99 kg/m  General: Well Developed, well nourished, and in no acute distress.  Neuro/Psych: Alert and oriented x3, extra-ocular muscles intact, able to move all 4 extremities, sensation grossly intact. Skin: Warm and dry, no rashes noted.  Respiratory: Not using accessory muscles, speaking in full sentences, trachea midline.  Cardiovascular: Pulses palpable, no extremity edema. Abdomen: Does not appear distended. MSK:  Left hip: Normal motion. Tender to palpation at the left greater trochanter especially at the insertion of the gluteus medius. He did abduction strength is diminished. Negative slump test bilaterally. Lower extremity strength is otherwise intact.  Recent MRI of L-spine dated January 2018 reviewed    No results found for this or any previous visit (from the past 93 hour(s)). No results found.    Assessment and Plan: 51 y.o. female with  Left lateral hip and buttocks pain concerning for greater trochanteric bursitis versus tendinitis of the gluteus medius tendons. I don't think patient had adequate trial of physical therapy. Plan to return to physical therapy for a dedicated hip abduction focused therapy.  Additionally patient has  pain radiating to her left leg and a L4 dermatomal pattern. She does have degenerative changes and bulging discs in this area on MRI. I think it is likely she does have neuro impingement. Plan for epidural steroid injection. Recheck in a few weeks.    Orders Placed This Encounter  Procedures  . DG Epidurography    Order Specific Question:   Reason for Exam (SYMPTOM  OR DIAGNOSIS  REQUIRED)    Answer:   Left L4    Order Specific Question:   Is the patient pregnant?    Answer:   No    Order Specific Question:   Preferred imaging location?    Answer:   [id number]  . Ambulatory referral to Physical Therapy    Referral Priority:   Routine    Referral Type:   Physical Medicine    Referral Reason:   Specialty Services Required    Requested Specialty:   Physical Therapy    Number of Visits Requested:   1   No orders of the defined types were placed in this encounter.   Discussed warning signs or symptoms. Please see discharge instructions. Patient expresses understanding.

## 2016-11-05 NOTE — Patient Instructions (Signed)
Thank you for coming in today. Re-start PT.  You should hear about the epidural injection.  Recheck in 4-6 weeks or sooner if needed.    Trochanteric Bursitis Trochanteric bursitis is a condition that causes hip pain. Trochanteric bursitis happens when fluid-filled sacs (bursae) in the hip get irritated. Normally these sacs absorb shock and help strong bands of tissue (tendons) in your hip glide smoothly over each other and over your hip bones. What are the causes? This condition results from increased friction between the hip bones and the tendons that go over them. This condition can happen if you:  Have weak hips.  Use your hip muscles too much (overuse).  Get hit in the hip. What increases the risk? This condition is more likely to develop in:  Women.  Adults who are middle-aged or older.  People with arthritis or a spinal condition.  People with weak buttocks muscles (gluteal muscles).  People who have one leg that is shorter than the other.  People who participate in certain kinds of athletic activities, such as:  Running sports, especially long-distance running.  Contact sports, like football or martial arts.  Sports in which falls may occur, like skiing. What are the signs or symptoms? The main symptom of this condition is pain and tenderness over the point of your hip. The pain may be:  Sharp and intense.  Dull and achy.  Felt on the outside of your thigh. It may increase when you:  Lie on your side.  Walk or run.  Go up on stairs.  Sit.  Stand up after sitting.  Stand for long periods of time. How is this diagnosed? This condition may be diagnosed based on:  Your symptoms.  Your medical history.  A physical exam.  Imaging tests, such as:  X-rays to check your bones.  An MRI or ultrasound to check your tendons and muscles. During your physical exam, your health care provider will check the movement and strength of your hip. He or she may  press on the point of your hip to check for pain. How is this treated? This condition may be treated by:  Resting.  Reducing your activity.  Avoiding activities that cause pain.  Using crutches, a cane, or a walker to decrease the strain on your hip.  Taking medicine to help with swelling.  Having medicine injected into the bursae to help with swelling.  Using ice, heat, and massage therapy for pain relief.  Physical therapy exercises for strength and flexibility.  Surgery (rare). Follow these instructions at home: Activity   Rest.  Avoid activities that cause pain.  Return to your normal activities as told by your health care provider. Ask your health care provider what activities are safe for you. Managing pain, stiffness, and swelling   Take over-the-counter and prescription medicines only as told by your health care provider.  If directed, apply heat to the injured area as told by your health care provider.  Place a towel between your skin and the heat source.  Leave the heat on for 20-30 minutes.  Remove the heat if your skin turns bright red. This is especially important if you are unable to feel pain, heat, or cold. You may have a greater risk of getting burned.  If directed, apply ice to the injured area:  Put ice in a plastic bag.  Place a towel between your skin and the bag.  Leave the ice on for 20 minutes, 2-3 times a day. General instructions  If the affected leg is one that you use for driving, ask your health care provider when it is safe to drive.  Use crutches, a cane, or a walker as told by your health care provider.  If one of your legs is shorter than the other, get fitted for a shoe insert.  Lose weight if you are overweight. How is this prevented?  Wear supportive footwear that is appropriate for your sport.  If you have hip pain, start any new exercise or sport slowly.  Maintain physical fitness,  including:  Strength.  Flexibility. Contact a health care provider if:  Your pain does not improve with 2-4 weeks. Get help right away if:  You develop severe pain.  You have a fever.  You develop increased redness over your hip.  You have a change in your bowel function or bladder function.  You cannot control the muscles in your feet. This information is not intended to replace advice given to you by your health care provider. Make sure you discuss any questions you have with your health care provider. Document Released: 07/03/2004 Document Revised: 01/30/2016 Document Reviewed: 05/11/2015 Elsevier Interactive Patient Education  2017 [employer].

## 2016-11-12 ENCOUNTER — Ambulatory Visit
Admission: RE | Admit: 2016-11-12 | Discharge: 2016-11-12 | Disposition: A | Discharge status: Home / Self Care | Payer: 59 | Source: Ambulatory Visit | Attending: Family Medicine | Admitting: Family Medicine

## 2016-11-12 DIAGNOSIS — M5116 Intervertebral disc disorders with radiculopathy, lumbar region: Secondary | ICD-10-CM | POA: Diagnosis not present

## 2016-11-12 MED ORDER — IOPAMIDOL (ISOVUE-M 200) INJECTION 41%
1.0000 mL | Freq: Once | INTRAMUSCULAR | Status: AC
  Administered 2016-11-12: 1 mL via EPIDURAL

## 2016-11-12 MED ORDER — METHYLPREDNISOLONE ACETATE 40 MG/ML INJ SUSP (RADIOLOG
120.0000 mg | Freq: Once | INTRAMUSCULAR | Status: AC
  Administered 2016-11-12: 120 mg via EPIDURAL

## 2016-11-12 NOTE — Discharge Instructions (Signed)

## 2016-12-04 ENCOUNTER — Encounter: Payer: Self-pay | Admitting: Family Medicine

## 2016-12-04 ENCOUNTER — Ambulatory Visit (INDEPENDENT_AMBULATORY_CARE_PROVIDER_SITE_OTHER): Payer: 59 | Admitting: Family Medicine

## 2016-12-04 VITALS — BP 118/78 | HR 91 | Temp 97.9°F | Ht 64.0 in | Wt 197.0 lb

## 2016-12-04 DIAGNOSIS — M7062 Trochanteric bursitis, left hip: Secondary | ICD-10-CM

## 2016-12-04 DIAGNOSIS — J0100 Acute maxillary sinusitis, unspecified: Secondary | ICD-10-CM | POA: Diagnosis not present

## 2016-12-04 MED ORDER — PREDNISONE 10 MG PO TABS: 30.0000 mg | ORAL_TABLET | Freq: Every day | ORAL | 0 refills | Status: DC

## 2016-12-04 MED ORDER — CEFDINIR 300 MG PO CAPS: 300.0000 mg | ORAL_CAPSULE | Freq: Two times a day (BID) | ORAL | 0 refills | Status: DC

## 2016-12-04 MED ORDER — LORAZEPAM 0.5 MG PO TABS: ORAL_TABLET | ORAL | 0 refills | Status: DC

## 2016-12-04 MED FILL — CEFDINIR 300 MG CAPSULE: 300 | 7 days supply | Qty: 14 | Fill #0

## 2016-12-04 MED FILL — LORazepam 0.5 MG TABS: 0.5 | 2 days supply | Qty: 4 | Fill #0

## 2016-12-04 MED FILL — predniSONE 10 MG TABS: 10 | 5 days supply | Qty: 15 | Fill #0

## 2016-12-04 NOTE — Patient Instructions (Signed)
Thank you for coming in today. Continue the over the counter cold medicines.  Take omnicef antibiotics twice daily for 1 week.  If not getting better take the prednisone as well.  Recheck as needed.    For hip pain we are planning on a MRI.   You should hear soon.   Follow up a few days after the MRI.     Sinusitis, Adult Sinusitis is soreness and inflammation of your sinuses. Sinuses are hollow spaces in the bones around your face. Your sinuses are located:  Around your eyes.  In the middle of your forehead.  Behind your nose.  In your cheekbones.  Your sinuses and nasal passages are lined with a stringy fluid (mucus). Mucus normally drains out of your sinuses. When your nasal tissues become inflamed or swollen, the mucus can become trapped or blocked so air cannot flow through your sinuses. This allows bacteria, viruses, and funguses to grow, which leads to infection. Sinusitis can develop quickly and last for 7?10 days (acute) or for more than 12 weeks (chronic). Sinusitis often develops after a cold. What are the causes? This condition is caused by anything that creates swelling in the sinuses or stops mucus from draining, including:  Allergies.  Asthma.  Bacterial or viral infection.  Abnormally shaped bones between the nasal passages.  Nasal growths that contain mucus (nasal polyps).  Narrow sinus openings.  Pollutants, such as chemicals or irritants in the air.  A foreign object stuck in the nose.  A fungal infection. This is rare.  What increases the risk? The following factors may make you more likely to develop this condition:  Having allergies or asthma.  Having had a recent cold or respiratory tract infection.  Having structural deformities or blockages in your nose or sinuses.  Having a weak immune system.  Doing a lot of swimming or diving.  Overusing nasal sprays.  Smoking.  What are the signs or symptoms? The main symptoms of this  condition are pain and a feeling of pressure around the affected sinuses. Other symptoms include:  Upper toothache.  Earache.  Headache.  Bad breath.  Decreased sense of smell and taste.  A cough that may get worse at night.  Fatigue.  Fever.  Thick drainage from your nose. The drainage is often green and it may contain pus (purulent).  Stuffy nose or congestion.  Postnasal drip. This is when extra mucus collects in the throat or back of the nose.  Swelling and warmth over the affected sinuses.  Sore throat.  Sensitivity to light.  How is this diagnosed? This condition is diagnosed based on symptoms, a medical history, and a physical exam. To find out if your condition is acute or chronic, your health care provider may:  Look in your nose for signs of nasal polyps.  Tap over the affected sinus to check for signs of infection.  View the inside of your sinuses using an imaging device that has a light attached (endoscope).  If your health care provider suspects that you have chronic sinusitis, you may also:  Be tested for allergies.  Have a sample of mucus taken from your nose (nasal culture) and checked for bacteria.  Have a mucus sample examined to see if your sinusitis is related to an allergy.  If your sinusitis does not respond to treatment and it lasts longer than 8 weeks, you may have an MRI or CT scan to check your sinuses. These scans also help to determine how severe  your infection is. In rare cases, a bone biopsy may be done to rule out more serious types of fungal sinus disease. How is this treated? Treatment for sinusitis depends on the cause and whether your condition is chronic or acute. If a virus is causing your sinusitis, your symptoms will go away on their own within 10 days. You may be given medicines to relieve your symptoms, including:  Topical nasal decongestants. They shrink swollen nasal passages and let mucus drain from your  sinuses.  Antihistamines. These drugs block inflammation that is triggered by allergies. This can help to ease swelling in your nose and sinuses.  Topical nasal corticosteroids. These are nasal sprays that ease inflammation and swelling in your nose and sinuses.  Nasal saline washes. These rinses can help to get rid of thick mucus in your nose.  If your condition is caused by bacteria, you will be given an antibiotic medicine. If your condition is caused by a fungus, you will be given an antifungal medicine. Surgery may be needed to correct underlying conditions, such as narrow nasal passages. Surgery may also be needed to remove polyps. Follow these instructions at home: Medicines  Take, use, or apply over-the-counter and prescription medicines only as told by your health care provider. These may include nasal sprays.  If you were prescribed an antibiotic medicine, take it as told by your health care provider. Do not stop taking the antibiotic even if you start to feel better. Hydrate and Humidify  Drink enough water to keep your urine clear or pale yellow. Staying hydrated will help to thin your mucus.  Use a cool mist humidifier to keep the humidity level in your home above 50%.  Inhale steam for 10-15 minutes, 3-4 times a day or as told by your health care provider. You can do this in the bathroom while a hot shower is running.  Limit your exposure to cool or dry air. Rest  Rest as much as possible.  Sleep with your head raised (elevated).  Make sure to get enough sleep each night. General instructions  Apply a warm, moist washcloth to your face 3-4 times a day or as told by your health care provider. This will help with discomfort.  Wash your hands often with soap and water to reduce your exposure to viruses and other germs. If soap and water are not available, use hand sanitizer.  Do not smoke. Avoid being around people who are smoking (secondhand smoke).  Keep all  follow-up visits as told by your health care provider. This is important. Contact a health care provider if:  You have a fever.  Your symptoms get worse.  Your symptoms do not improve within 10 days. Get help right away if:  You have a severe headache.  You have persistent vomiting.  You have pain or swelling around your face or eyes.  You have vision problems.  You develop confusion.  Your neck is stiff.  You have trouble breathing. This information is not intended to replace advice given to you by your health care provider. Make sure you discuss any questions you have with your health care provider. Document Released: 05/26/2005 Document Revised: 01/20/2016 Document Reviewed: 03/21/2015 Elsevier Interactive Patient Education  2017 [employer].

## 2016-12-04 NOTE — Progress Notes (Signed)
Stacy Woods is a 51 y.o. female who presents to [facility]: [facility] today for sinus pain and pressure nasal discharge and congestion and right-sided ear pain. Patient has had sinus congestion for 10 days. She worsened over the last few days has developed right-sided sinus pain and pressure. She denies fevers or chills. She notes symptoms are consistent with prior history of sinus infection. She has tried multiple over-the-counter medications which only helped a little. No vomiting or diarrhea.  Additionally patient continues to experience left lateral hip pain. She's had an extensive workup and treatment with physical therapy. None of this is really helped much. Her symptoms are somewhat consistent with L4 radiculopathy. On June 6 she had a trial of left L4 epidural injection which did not help at all. She has had unguided left trochanteric bursa injections as well which have not helped.   Past Medical History:  Diagnosis Date  . Anemia   . Anemia, iron deficiency 05/02/2015  . Fibromyalgia   . History of gastric bypass 05/09/2016  . Hypertension   . Malabsorption of iron 05/09/2016  . PONV (postoperative nausea and vomiting)   . Surgical menopause 03/24/2016   Past Surgical History:  Procedure Laterality Date  . ABDOMINAL HYSTERECTOMY  1997  . APPENDECTOMY    . CESAREAN SECTION  [id number]  . CHOLECYSTECTOMY    . GASTRIC BYPASS  2011  . KNEE ARTHROSCOPY  2002  . TONSILLECTOMY  1977  . ULNAR NERVE TRANSPOSITION Right 06/12/2015   Procedure: RIGHT CUBITAL TUNNEL RELEASE;  Surgeon: Milly Jakob, MD;  Location: [facility];  Service: Orthopedics;  Laterality: Right;   Social History  Substance Use Topics  . Smoking status: Never Smoker  . Smokeless tobacco: Never Used  . Alcohol use No   family history includes Diabetes in her father; Heart failure in her  father; Hypertension in her father; Stroke in her father.  ROS as above:  Medications: Current Outpatient Prescriptions  Medication Sig Dispense Refill  . AMBULATORY NON FORMULARY MEDICATION One Touch Glucometer per insurance formulary. One month of testing strips, and lancets used to check blood sugar daily. 1 Units prn  . diclofenac sodium (VOLTAREN) 1 % GEL Apply 4 g topically 4 (four) times daily. To affected joint. 100 g 11  . escitalopram (LEXAPRO) 10 MG tablet TAKE 1 TABLET (10 MG TOTAL) BY MOUTH DAILY. 90 tablet 3  . cefdinir (OMNICEF) 300 MG capsule Take 1 capsule (300 mg total) by mouth 2 (two) times daily. 14 capsule 0  . LORazepam (ATIVAN) 0.5 MG tablet 1-2 tabs 30-60 mins prior to MRI 4 tablet 0  . predniSONE (DELTASONE) 10 MG tablet Take 3 tablets (30 mg total) by mouth daily with breakfast. 15 tablet 0   No current facility-administered medications for this visit.    Allergies  Allergen Reactions  . Mobic [Meloxicam] Swelling  . Shellfish Allergy Nausea Only and Swelling  . Lyrica [Pregabalin] Other (See Comments)    "zombie"  . Percocet [Oxycodone-Acetaminophen] Itching    Health Maintenance Health Maintenance  Topic Date Due  . HIV Screening  02/26/1983  . INFLUENZA VACCINE  01/07/2017  . TETANUS/TDAP  03/24/2026     Exam:  BP 118/78   Pulse 91   Temp 97.9 F (36.6 C) (Oral)   Ht 5\' 4"  (1.626 m)   Wt 197 lb (89.4 kg)   BMI 33.81 kg/m  Gen: Well NAD HEENT: EOMI,  MMM  Clear nasal discharge. Inflamed nasal turbinates bilaterally. Tender palpation right maxillary sinus. Normal tympanic membranes bilaterally. Mild cervical lymphadenopathy present bilaterally Lungs: Normal work of breathing. CTABL Heart: RRR no MRG Abd: NABS, Soft. Nondistended, Nontender Exts: Brisk capillary refill, warm and well perfused.  Left hip tender at the greater trochanter mild antalgic gait.   No results found for this or any previous visit (from the past 72 hour(s)). No  results found.    Assessment and Plan: 51 y.o. female with  Sinusitis likely bacterial with history of secondary sickening. Plan for treatment with oral Omnicef and backup prednisone. Continue to follow.  Left lateral hip pain: Etiology is unclear. Patient has failed multiple different treatment modalities. Plan for MRI of the left hip to evaluate for hip abductor tendinopathy or trochanteric bursitis.   Orders Placed This Encounter  Procedures  . MR HIP LEFT WO CONTRAST    Standing Status:   Future    Standing Expiration Date:   02/03/2018    Order Specific Question:   Reason for Exam (SYMPTOM  OR DIAGNOSIS REQUIRED)    Answer:   eval troch bursitis? left lateral hip    Order Specific Question:   What is the patient's sedation requirement?    Answer:   Anti-anxiety    Order Specific Question:   Does the patient have a pacemaker or implanted devices?    Answer:   No    Order Specific Question:   Preferred imaging location?    Answer:   [facility] (table limit-350lbs)    Order Specific Question:   Radiology Contrast Protocol - do NOT remove file path    Answer:   \\[email or web address]   Meds ordered this encounter  Medications  . cefdinir (OMNICEF) 300 MG capsule    Sig: Take 1 capsule (300 mg total) by mouth 2 (two) times daily.    Dispense:  14 capsule    Refill:  0  . predniSONE (DELTASONE) 10 MG tablet    Sig: Take 3 tablets (30 mg total) by mouth daily with breakfast.    Dispense:  15 tablet    Refill:  0  . LORazepam (ATIVAN) 0.5 MG tablet    Sig: 1-2 tabs 30-60 mins prior to MRI    Dispense:  4 tablet    Refill:  0     Discussed warning signs or symptoms. Please see discharge instructions. Patient expresses understanding.

## 2016-12-08 ENCOUNTER — Ambulatory Visit (INDEPENDENT_AMBULATORY_CARE_PROVIDER_SITE_OTHER): Payer: 59

## 2016-12-08 DIAGNOSIS — M5442 Lumbago with sciatica, left side: Secondary | ICD-10-CM

## 2016-12-08 DIAGNOSIS — M25552 Pain in left hip: Secondary | ICD-10-CM | POA: Diagnosis not present

## 2016-12-08 DIAGNOSIS — M7062 Trochanteric bursitis, left hip: Secondary | ICD-10-CM

## 2016-12-09 ENCOUNTER — Encounter: Payer: Self-pay | Admitting: Family Medicine

## 2016-12-30 ENCOUNTER — Ambulatory Visit (INDEPENDENT_AMBULATORY_CARE_PROVIDER_SITE_OTHER): Payer: 59

## 2016-12-30 ENCOUNTER — Ambulatory Visit (INDEPENDENT_AMBULATORY_CARE_PROVIDER_SITE_OTHER): Payer: 59 | Admitting: Family Medicine

## 2016-12-30 ENCOUNTER — Encounter: Payer: Self-pay | Admitting: Family Medicine

## 2016-12-30 VITALS — BP 114/76 | HR 83 | Wt 202.0 lb

## 2016-12-30 DIAGNOSIS — M79652 Pain in left thigh: Secondary | ICD-10-CM | POA: Diagnosis not present

## 2016-12-30 DIAGNOSIS — M25562 Pain in left knee: Secondary | ICD-10-CM | POA: Diagnosis not present

## 2016-12-30 DIAGNOSIS — E162 Hypoglycemia, unspecified: Secondary | ICD-10-CM | POA: Diagnosis not present

## 2016-12-30 DIAGNOSIS — K909 Intestinal malabsorption, unspecified: Secondary | ICD-10-CM | POA: Diagnosis not present

## 2016-12-30 LAB — CBC
HEMATOCRIT: 42.4 % (ref 35.0–45.0)
Hemoglobin: 14.4 g/dL (ref 11.7–15.5)
MCH: 31.4 pg (ref 27.0–33.0)
MCHC: 34 g/dL (ref 32.0–36.0)
MCV: 92.4 fL (ref 80.0–100.0)
MPV: 10.8 fL (ref 7.5–12.5)
PLATELETS: 317 10*3/uL (ref 140–400)
RBC: 4.59 MIL/uL (ref 3.80–5.10)
RDW: 14.2 % (ref 11.0–15.0)
WBC: 6.4 10*3/uL (ref 3.8–10.8)

## 2016-12-30 MED ORDER — AMITRIPTYLINE HCL 25 MG PO TABS: 25.0000 mg | ORAL_TABLET | Freq: Every day | ORAL | 1 refills | Status: DC

## 2016-12-30 NOTE — Progress Notes (Signed)
Stacy Woods is a 51 y.o. female who presents to [facility] today for tinea left leg pain. Patient continues to experience quite severe pain radiating from the left lateral hip across the lateral aspect of her upper leg to the anterior proximal tibia to the medial distal tibia. This is been ongoing for months. She's had an extensive workup so far that is unrevealing. She had a relatively normal lumbar MRI normal hip MRI and normal x-rays of the pelvis and lumbar spine. She had a trial of a lumbar epidural steroid injection which provided no relief. She notes the pain is quite bad even with rest and is keeping her up at night. The pain is not similar to previous episodes of fibromyalgia. The pain is as bad as 9 out of 10 at times.   Past Medical History:  Diagnosis Date  . Anemia   . Anemia, iron deficiency 05/02/2015  . Fibromyalgia   . History of gastric bypass 05/09/2016  . Hypertension   . Malabsorption of iron 05/09/2016  . PONV (postoperative nausea and vomiting)   . Surgical menopause 03/24/2016   Past Surgical History:  Procedure Laterality Date  . ABDOMINAL HYSTERECTOMY  1997  . APPENDECTOMY    . CESAREAN SECTION  [id number]  . CHOLECYSTECTOMY    . GASTRIC BYPASS  2011  . KNEE ARTHROSCOPY  2002  . TONSILLECTOMY  1977  . ULNAR NERVE TRANSPOSITION Right 06/12/2015   Procedure: RIGHT CUBITAL TUNNEL RELEASE;  Surgeon: Milly Jakob, MD;  Location: [facility];  Service: Orthopedics;  Laterality: Right;   Social History  Substance Use Topics  . Smoking status: Never Smoker  . Smokeless tobacco: Never Used  . Alcohol use No     ROS:  As above   Medications: Current Outpatient Prescriptions  Medication Sig Dispense Refill  . AMBULATORY NON FORMULARY MEDICATION One Touch Glucometer per insurance formulary. One month of testing strips, and lancets used to check blood sugar daily. 1 Units prn  . escitalopram (LEXAPRO)  10 MG tablet TAKE 1 TABLET (10 MG TOTAL) BY MOUTH DAILY. 90 tablet 3  . amitriptyline (ELAVIL) 25 MG tablet Take 1 tablet (25 mg total) by mouth at bedtime. 30 tablet 1   No current facility-administered medications for this visit.    Allergies  Allergen Reactions  . Mobic [Meloxicam] Swelling  . Shellfish Allergy Nausea Only and Swelling  . Lyrica [Pregabalin] Other (See Comments)    "zombie"  . Percocet [Oxycodone-Acetaminophen] Itching     Exam:  BP 114/76   Pulse 83   Wt 202 lb (91.6 kg)   SpO2 99%   BMI 34.67 kg/m  General: Well Developed, well nourished, and in no acute distress.  Neuro/Psych: Alert and oriented x3, extra-ocular muscles intact, able to move all 4 extremities, sensation grossly intact. Skin: Warm and dry, no rashes noted.  Respiratory: Not using accessory muscles, speaking in full sentences, trachea midline.  Cardiovascular: Pulses palpable, no extremity edema. Abdomen: Does not appear distended. MSK: Left lumbar spine hip and leg normal-appearing mildly tender to palpation across the lateral thigh. Mild antalgic gait present.    No results found for this or any previous visit (from the past 48 hour(s)). Dg Femur Min 2 Views Left  Result Date: 12/30/2016 CLINICAL DATA:  LEFT upper leg pain for months, no known injury, LEFT thigh pain EXAM: LEFT FEMUR 2 VIEWS COMPARISON:  None FINDINGS: Osseous mineralization normal. Hip and knee joint alignments normal. No  acute fracture, dislocation or bone destruction. IMPRESSION: No acute abnormalities. Electronically Signed   By: Lavonia Dana M.D.   On: 12/30/2016 16:12      Assessment and Plan: 51 y.o. female with left leg pain without clear etiology. Patient has had a pretty thorough workup so far that has been unrevealing. We'll plan to broaden workup to include metabolic workup checking M-25 levels as well as ordering a nerve conduction study and ABI. Trial of treatment with amitriptyline. Recheck after  workup.    Orders Placed This Encounter  Procedures  . DG FEMUR MIN 2 VIEWS LEFT    Standing Status:   Future    Number of Occurrences:   1    Standing Expiration Date:   03/02/2018    Order Specific Question:   Reason for Exam (SYMPTOM  OR DIAGNOSIS REQUIRED)    Answer:   eval left thigh pain    Order Specific Question:   Is patient pregnant?    Answer:   No    Order Specific Question:   Preferred imaging location?    Answer:   [facility]    Order Specific Question:   Radiology Contrast Protocol - do NOT remove file path    Answer:   \\[email or web address]\[email or web address].pdf  . CBC  . COMPLETE METABOLIC PANEL WITH GFR  . Vitamin B12  . Folate  . Methylmalonic acid, serum  . Homocysteine  . Nerve conduction test    Order Specific Question:   Where should this test be performed?    Answer:   [facility]   Meds ordered this encounter  Medications  . amitriptyline (ELAVIL) 25 MG tablet    Sig: Take 1 tablet (25 mg total) by mouth at bedtime.    Dispense:  30 tablet    Refill:  1    Discussed warning signs or symptoms. Please see discharge instructions. Patient expresses understanding.

## 2016-12-30 NOTE — Patient Instructions (Signed)
Thank you for coming in today. Get labs today.  Get xray today.,  You should hear from both [facility] and [facility] about further testing.   Take Amitrypline for pain at night.   Let me know how you are doing.    We will keep working on this.

## 2016-12-31 LAB — COMPLETE METABOLIC PANEL WITH GFR
ALT: 38 U/L — AB (ref 6–29)
AST: 36 U/L — ABNORMAL HIGH (ref 10–35)
Albumin: 4.1 g/dL (ref 3.6–5.1)
Alkaline Phosphatase: 109 U/L (ref 33–115)
BUN: 13 mg/dL (ref 7–25)
CALCIUM: 8.8 mg/dL (ref 8.6–10.2)
CHLORIDE: 108 mmol/L (ref 98–110)
CO2: 22 mmol/L (ref 20–31)
CREATININE: 0.98 mg/dL (ref 0.50–1.10)
GFR, EST AFRICAN AMERICAN: 79 mL/min (ref >=60)
GFR, EST NON AFRICAN AMERICAN: 68 mL/min (ref >=60)
Glucose, Bld: 71 mg/dL (ref 65–99)
POTASSIUM: 4.4 mmol/L (ref 3.5–5.3)
Sodium: 141 mmol/L (ref 135–146)
Total Bilirubin: 0.5 mg/dL (ref 0.2–1.2)
Total Protein: 6.5 g/dL (ref 6.1–8.1)

## 2016-12-31 LAB — FOLATE: FOLATE: 9.4 ng/mL (ref >5.4)

## 2016-12-31 LAB — VITAMIN B12: VITAMIN B 12: 164 pg/mL — AB (ref 200–1100)

## 2016-12-31 LAB — HOMOCYSTEINE: Homocysteine: 17.3 umol/L — ABNORMAL HIGH (ref <10.4)

## 2017-01-01 ENCOUNTER — Encounter: Payer: Self-pay | Admitting: Family Medicine

## 2017-01-01 DIAGNOSIS — E538 Deficiency of other specified B group vitamins: Secondary | ICD-10-CM | POA: Insufficient documentation

## 2017-01-02 ENCOUNTER — Ambulatory Visit (INDEPENDENT_AMBULATORY_CARE_PROVIDER_SITE_OTHER): Payer: 59 | Admitting: Sports Medicine

## 2017-01-02 VITALS — BP 120/79 | HR 74 | Resp 16 | Wt 200.7 lb

## 2017-01-02 DIAGNOSIS — E538 Deficiency of other specified B group vitamins: Secondary | ICD-10-CM

## 2017-01-02 LAB — METHYLMALONIC ACID, SERUM: METHYLMALONIC ACID, QUANT: 392 nmol/L — AB (ref 87–318)

## 2017-01-02 MED ORDER — CYANOCOBALAMIN 1000 MCG/ML IJ SOLN
1000.0000 ug | Freq: Once | INTRAMUSCULAR | Status: AC
  Administered 2017-01-02: 1000 ug via INTRAMUSCULAR

## 2017-01-02 NOTE — Progress Notes (Signed)
Pt is here for initial Vit B12 injection. Denies any GI problems or dizziness. Pt tolerated injection into L deltoid without complications. Pt advised to return for next injection in 1 month.

## 2017-01-08 ENCOUNTER — Ambulatory Visit (INDEPENDENT_AMBULATORY_CARE_PROVIDER_SITE_OTHER): Payer: 59 | Admitting: Diagnostic Neuroimaging

## 2017-01-08 ENCOUNTER — Encounter (INDEPENDENT_AMBULATORY_CARE_PROVIDER_SITE_OTHER): Payer: Self-pay | Admitting: Diagnostic Neuroimaging

## 2017-01-08 DIAGNOSIS — M79605 Pain in left leg: Secondary | ICD-10-CM | POA: Diagnosis not present

## 2017-01-08 DIAGNOSIS — Z0289 Encounter for other administrative examinations: Secondary | ICD-10-CM

## 2017-01-09 NOTE — Procedures (Signed)
[facility]  NCS (NERVE CONDUCTION STUDY) WITH EMG (ELECTROMYOGRAPHY) REPORT   STUDY DATE: 01/08/17 PATIENT NAME: Stacy Woods DOB: 1965-08-16 MRN: [id number]  ORDERING CLINICIAN: Lynne Leader  TECHNOLOGIST: Oneita Jolly ELECTROMYOGRAPHER: Earlean Polka. Penumalli, MD  CLINICAL INFORMATION: 51 year old female with left hip pain radiating to left thigh and left foot.   FINDINGS: NERVE CONDUCTION STUDY: Left peroneal and left tibial motor responses are normal. Left tibial F-wave latency is normal. Left sural and left superficial peroneal sensory responses are normal.  Left lateral femoral cutaneous nerve of thigh not be obtained. Right lateral femoral cutaneous nerve of thigh has decreased amplitude.  Bilateral H reflex responses with stimulation tibial nerves and recording of her soleus muscles are normal and symmetric.   NEEDLE ELECTROMYOGRAPHY: Left vastus medialis, tibials anterior, gastrocnemius and left lumbar paraspinal muscles are normal.   IMPRESSION:  Mildly abnormal study demonstrating: 1. Bilateral (left greater than right) lateral femoral cutaneous neuropathies (i.e. meralgia paresthetica). 2. No definite evidence of underlying large fiber neuropathy or lumbar radiculopathy at this time.    INTERPRETING PHYSICIAN:  Penni Bombard, MD Certified in Neurology, Neurophysiology and Neuroimaging  [facility] [address] [place], [place] [address] [phone]  [facility]    Nerve / Sites Muscle Latency Ref. Amplitude Ref. Rel Amp Segments Distance Velocity Ref. Area    ms ms mV mV %  cm m/s m/s mVms  L Peroneal - EDB     Ankle EDB 3.8 ?6.5 4.1 ?2.0 100 Ankle - EDB 9   12.4     Fib head EDB 9.1  3.5  83.6 Fib head - Ankle 26 49 ?44 11.3     Pop fossa EDB 11.3  3.6  105 Pop fossa - Fib head 10 47 ?44 12.0         Pop fossa - Ankle      L Tibial - AH     Ankle AH 3.4 ?5.8 23.9 ?4.0 100 Ankle - AH 9   61.1     Pop fossa  AH 11.1  15.4  64.2 Pop fossa - Ankle 32 42 ?41 40.3         SNC    Nerve / Sites Rec. Site Peak Lat Ref.  Amp Ref. Segments Distance    ms ms V V  cm  L Sural - Ankle (Calf)     Calf Ankle 3.6 ?4.4 8 ?6 Calf - Ankle 14  L Superficial peroneal - Ankle     Lat leg Ankle 3.9 ?4.4 7 ?6 Lat leg - Ankle 14  R Lateral femoral cutaneous - Thigh (Inguinal ligament)     A. Ing ligament Thigh 3.2  1  A. Ing ligament - Thigh   L Lateral femoral cutaneous - Thigh (Inguinal ligament)     A. Ing ligament Thigh NR  NR  A. Ing ligament - Thigh              F  Wave    Nerve F Lat Ref.   ms ms  L Tibial - AH 46.0 ?56.0       H Reflex    Nerve H Lat Lat Hmax   ms ms   Left Right Ref. Left Right Ref.  Tibial - [place] 30.5 30.8 ?35.0 30.5 30.8 ?35.0         EMG full       EMG Summary Table    Spontaneous MUAP Recruitment  Muscle IA Fib  PSW Fasc Other Amp Dur. Poly Pattern  L. Tibialis anterior Normal None None None _______ Normal Normal Normal Normal  L. Gastrocnemius (Medial head) Normal None None None _______ Normal Normal Normal Normal  L. Vastus medialis Normal None None None _______ Normal Normal Normal Normal  L. Lumbar paraspinals Normal None None None _______ Normal Normal Normal Normal

## 2017-01-11 DIAGNOSIS — Z79899 Other long term (current) drug therapy: Secondary | ICD-10-CM | POA: Diagnosis not present

## 2017-01-11 DIAGNOSIS — R4182 Altered mental status, unspecified: Secondary | ICD-10-CM | POA: Diagnosis not present

## 2017-01-11 DIAGNOSIS — R404 Transient alteration of awareness: Secondary | ICD-10-CM | POA: Diagnosis not present

## 2017-01-11 DIAGNOSIS — S00512A Abrasion of oral cavity, initial encounter: Secondary | ICD-10-CM | POA: Diagnosis not present

## 2017-01-11 DIAGNOSIS — R569 Unspecified convulsions: Secondary | ICD-10-CM | POA: Diagnosis not present

## 2017-01-11 DIAGNOSIS — E860 Dehydration: Secondary | ICD-10-CM | POA: Diagnosis not present

## 2017-01-12 ENCOUNTER — Encounter: Payer: Self-pay | Admitting: Family Medicine

## 2017-01-12 ENCOUNTER — Ambulatory Visit (INDEPENDENT_AMBULATORY_CARE_PROVIDER_SITE_OTHER): Payer: 59 | Admitting: Family Medicine

## 2017-01-12 VITALS — BP 124/88 | HR 72 | Wt 200.0 lb

## 2017-01-12 DIAGNOSIS — R790 Abnormal level of blood mineral: Secondary | ICD-10-CM

## 2017-01-12 DIAGNOSIS — E538 Deficiency of other specified B group vitamins: Secondary | ICD-10-CM | POA: Diagnosis not present

## 2017-01-12 DIAGNOSIS — R569 Unspecified convulsions: Secondary | ICD-10-CM | POA: Diagnosis not present

## 2017-01-12 DIAGNOSIS — E162 Hypoglycemia, unspecified: Secondary | ICD-10-CM | POA: Diagnosis not present

## 2017-01-12 MED ORDER — MAGNESIUM OXIDE 400 MG PO TABS: 800.0000 mg | ORAL_TABLET | Freq: Every day | ORAL | 3 refills | Status: DC

## 2017-01-12 MED ORDER — GLUCAGON (RDNA) 1 MG IJ KIT: 1.0000 mg | PACK | Freq: Once | INTRAMUSCULAR | 12 refills | Status: DC | PRN

## 2017-01-12 MED FILL — GLUCAGON 1 MG EMERGENCY KIT: 1 | 1 days supply | Qty: 1 | Fill #0

## 2017-01-12 MED FILL — MAGNESIUM OXIDE 400 MG TAB: 400 (240 MG | 60 days supply | Qty: 120 | Fill #0

## 2017-01-12 NOTE — Patient Instructions (Addendum)
Thank you for coming in today. We will get records from the ED.  You should hear form both Neurology and Endocrinology soon.  Let me know if you hit brick walls.   Recheck with me soon.   Use glucose tablets if you have low blood sugars.   Use a glucagon injection if blood sugar gets less than 60 and you feel bad.    No swimming or driving or baths alone until seen by Neuro.

## 2017-01-12 NOTE — Progress Notes (Signed)
Stacy Woods is a 51 y.o. female who presents to [facility] today for seizure. Patient suffered a witnessed seizure and [place] over the weekend. Prior to the seizure she felt tingly and lightheaded. She's had similar episodes of lightheadedness prior where her blood sugar was less than 50 measured by home glucometer. She reportedly had a grand mal seizure that lasted for 2 or 3 minutes. She was worked up in the emergency department after stabilization. Her blood sugar when it was checked was 66. She had a CT scan that was reportedly normal and lab workup that was reportedly normal with the exception of mildly low magnesium. The only new medicine that she has been taking his low dose amitriptyline at night to relieve her leg pain that has been worked up previously. Additionally she was recently given a B-12 shot to combat recently diagnosed B-12 deficiency. She feels fatigued and tired but has not had any further seizure-like episodes. She also has not had any further hypoglycemic episodes. She has no history of seizure disorder.   Past Medical History:  Diagnosis Date  . Anemia   . Anemia, iron deficiency 05/02/2015  . Fibromyalgia   . History of gastric bypass 05/09/2016  . Hypertension   . Malabsorption of iron 05/09/2016  . PONV (postoperative nausea and vomiting)   . Surgical menopause 03/24/2016   Past Surgical History:  Procedure Laterality Date  . ABDOMINAL HYSTERECTOMY  1997  . APPENDECTOMY    . CESAREAN SECTION  [id number]  . CHOLECYSTECTOMY    . GASTRIC BYPASS  2011  . KNEE ARTHROSCOPY  2002  . TONSILLECTOMY  1977  . ULNAR NERVE TRANSPOSITION Right 06/12/2015   Procedure: RIGHT CUBITAL TUNNEL RELEASE;  Surgeon: Milly Jakob, MD;  Location: [facility];  Service: Orthopedics;  Laterality: Right;   Social History  Substance Use Topics  .  Smoking status: Never Smoker  . Smokeless tobacco: Never Used  . Alcohol use No   family history includes Diabetes in her father; Heart failure in her father; Hypertension in her father; Stroke in her father.  ROS as above:  Medications: Current Outpatient Prescriptions  Medication Sig Dispense Refill  . AMBULATORY NON FORMULARY MEDICATION One Touch Glucometer per insurance formulary. One month of testing strips, and lancets used to check blood sugar daily. 1 Units prn  . amitriptyline (ELAVIL) 25 MG tablet Take 1 tablet (25 mg total) by mouth at bedtime. 30 tablet 1  . escitalopram (LEXAPRO) 10 MG tablet TAKE 1 TABLET (10 MG TOTAL) BY MOUTH DAILY. 90 tablet 3  . glucagon 1 MG injection Inject 1 mg into the muscle once as needed. For low blood sugar 1 each 12  . magnesium oxide (MAG-OX) 400 MG tablet Take 2 tablets (800 mg total) by mouth at bedtime. 90 tablet 3   No current facility-administered medications for this visit.    Allergies  Allergen Reactions  . Mobic [Meloxicam] Swelling  . Shellfish Allergy Nausea Only and Swelling  . Lyrica [Pregabalin] Other (See Comments)    "zombie"  . Percocet [Oxycodone-Acetaminophen] Itching    Health Maintenance Health Maintenance  Topic Date Due  . HIV Screening  02/26/1983  . INFLUENZA VACCINE  01/07/2017  . TETANUS/TDAP  03/24/2026     Exam:  BP 124/88   Pulse 72   Wt 200 lb (90.7 kg)   BMI 34.33 kg/m  Gen: Well NAD HEENT: EOMI,  MMM Lungs: Normal  work of breathing. CTABL Heart: RRR no MRG Abd: NABS, Soft. Nondistended, Nontender Exts: Brisk capillary refill, warm and well perfused.  Neuro: Alert and oriented normal speech and balance gait coordination strength sensation reflexes.  No results found for this or any previous visit (from the past 72 hour(s)). No results found.    Assessment and Plan: 51 y.o. female with seizure likely due to underlying hypoglycemic episode. CT scan reportedly normal but patient will  likely benefit from further neuroimaging. Regardless I think it's reasonable to refer to neurology for presumed DVT and neuro imaging. This patient reports low magnesium levels I think is reasonable supplement with oral magnesium. Additionally if it is reasonable to stop amitriptyline although I think it's unlikely this was the cause of seizures.    Additionally because patient has a history of hypoglycemic episodes now with seizure I think is reasonable to refer her to endocrinology since I don't see any obvious cause for these hypoglycemic episodes  Plan to follow-up with me soon in the near future based on results with both endocrinology and neurology.     Orders Placed This Encounter  Procedures  . Ambulatory referral to Neurology    Referral Priority:   Urgent    Referral Type:   Consultation    Referral Reason:   Specialty Services Required    Requested Specialty:   Neurology    Number of Visits Requested:   1  . Ambulatory referral to Endocrinology    Referral Priority:   Routine    Referral Type:   Consultation    Referral Reason:   Specialty Services Required    Referred to Provider:   Elayne Snare, MD    Number of Visits Requested:   1   Meds ordered this encounter  Medications  . magnesium oxide (MAG-OX) 400 MG tablet    Sig: Take 2 tablets (800 mg total) by mouth at bedtime.    Dispense:  90 tablet    Refill:  3  . glucagon 1 MG injection    Sig: Inject 1 mg into the muscle once as needed. For low blood sugar    Dispense:  1 each    Refill:  12     Discussed warning signs or symptoms. Please see discharge instructions. Patient expresses understanding.

## 2017-01-13 ENCOUNTER — Inpatient Hospital Stay: Payer: 59 | Admitting: Family Medicine

## 2017-01-16 ENCOUNTER — Telehealth: Payer: Self-pay | Admitting: Endocrinology

## 2017-01-16 ENCOUNTER — Encounter: Payer: Self-pay | Admitting: Diagnostic Neuroimaging

## 2017-01-16 ENCOUNTER — Ambulatory Visit (INDEPENDENT_AMBULATORY_CARE_PROVIDER_SITE_OTHER): Payer: 59 | Admitting: Diagnostic Neuroimaging

## 2017-01-16 VITALS — BP 132/93 | HR 63 | Ht 64.0 in | Wt 200.2 lb

## 2017-01-16 DIAGNOSIS — E162 Hypoglycemia, unspecified: Secondary | ICD-10-CM | POA: Diagnosis not present

## 2017-01-16 DIAGNOSIS — R569 Unspecified convulsions: Secondary | ICD-10-CM | POA: Diagnosis not present

## 2017-01-16 MED ORDER — ALPRAZOLAM 0.5 MG PO TABS: 0.5000 mg | ORAL_TABLET | ORAL | 0 refills | Status: DC | PRN

## 2017-01-16 MED FILL — ALPRAZolam 0.5 MG TABS: 0.5 | 3 days supply | Qty: 3 | Fill #0

## 2017-01-16 NOTE — Progress Notes (Signed)
[facility]  PATIENT: Stacy Woods DOB: 1965-09-21  REFERRING CLINICIAN: Steva Colder HISTORY FROM: patient, husband, mother  REASON FOR VISIT: new consult    HISTORICAL  CHIEF COMPLAINT:  Chief Complaint  Patient presents with  . Rm 6  . New Evaluation    Husband, Louie Casa, and mom, Ulis Rias  . Seizures    Pt here to f/u on 01/11/17 ER visit for seizure episode likely r/t low blood sugar. She also had hypomagnesemia and vit B12 def. No additional s/s of seizure since that time.  Pt now has FSBS machine and glucagon on hand if needed.    HISTORY OF PRESENT ILLNESS:   51 year old right-handed female here for evaluation of seizure. I recently saw patient for EMG nerve conduction study for bilateral leg numbness and pain. Now patient presented for new issue.  01/11/17 patient [place] an amusement park with family, was in the gift shop taking pictures. She fell sweaty, not well, started to fall backwards and collapse. Patient had convulsions, tongue biting. She is taken to the urgent care on site and transfer to local emergency room. Patient had CT scan, EKG, lab testing. Blood sugar was 66. Patient was noted to be slightly dehydrated and had low back magnesium level. Patient was amnestic for events leading up to and immediately following the seizure. Patient was diagnosed with probable hypoglycemic seizure and referred to me for further evaluation.  Patient has had several episodes of intermittent hypoglycemia over the last 1 year. She has measured blood sugars as low as 40 in the past. This is patient's first seizure of life.  Patient has family history of seizure in her sister, who had seizure related to cyst and trauma.  Patient denies any personal history of CNS infections or brain trauma.  Patient feels back to baseline.  Patient has history of gastric bypass surgery 2011. She also has fibromyalgia. She has had some insomnia lately. Some increased family stress  lately.    REVIEW OF SYSTEMS: Full 14 system review of systems performed and negative with exception of: As per history of present illness.  ALLERGIES: Allergies  Allergen Reactions  . Mobic [Meloxicam] Swelling  . Shellfish Allergy Nausea Only and Swelling  . Lyrica [Pregabalin] Other (See Comments)    "zombie"  . Percocet [Oxycodone-Acetaminophen] Itching    HOME MEDICATIONS: Outpatient Medications Prior to Visit  Medication Sig Dispense Refill  . AMBULATORY NON FORMULARY MEDICATION One Touch Glucometer per insurance formulary. One month of testing strips, and lancets used to check blood sugar daily. 1 Units prn  . escitalopram (LEXAPRO) 10 MG tablet TAKE 1 TABLET (10 MG TOTAL) BY MOUTH DAILY. 90 tablet 3  . glucagon 1 MG injection Inject 1 mg into the muscle once as needed. For low blood sugar 1 each 12  . magnesium oxide (MAG-OX) 400 MG tablet Take 2 tablets (800 mg total) by mouth at bedtime. 90 tablet 3  . amitriptyline (ELAVIL) 25 MG tablet Take 1 tablet (25 mg total) by mouth at bedtime. 30 tablet 1   No facility-administered medications prior to visit.     PAST MEDICAL HISTORY: Past Medical History:  Diagnosis Date  . Anemia   . Anemia, iron deficiency 05/02/2015  . Fibromyalgia   . History of gastric bypass 05/09/2016  . Hypertension   . Malabsorption of iron 05/09/2016  . PONV (postoperative nausea and vomiting)   . Surgical menopause 03/24/2016    PAST SURGICAL HISTORY: Past Surgical History:  Procedure Laterality Date  .  ABDOMINAL HYSTERECTOMY  1997  . APPENDECTOMY    . CESAREAN SECTION  [id number]  . CHOLECYSTECTOMY    . GASTRIC BYPASS  2011  . KNEE ARTHROSCOPY  2002  . TONSILLECTOMY  1977  . ULNAR NERVE TRANSPOSITION Right 06/12/2015   Procedure: RIGHT CUBITAL TUNNEL RELEASE;  Surgeon: Milly Jakob, MD;  Location: [place];  Service: Orthopedics;  Laterality: Right;    FAMILY HISTORY: Family History  Problem Relation Age of Onset   . Diabetes Father   . Hypertension Father   . Stroke Father   . Heart failure Father   . Breast cancer Mother     SOCIAL HISTORY:  Social History   Social History  . Marital status: Married    Spouse name: N/A  . Number of children: 2  . Years of education: Assoc   Occupational History  . [employer]    Social History Main Topics  . Smoking status: Never Smoker  . Smokeless tobacco: Never Used  . Alcohol use No  . Drug use: No  . Sexual activity: No   Other Topics Concern  . Not on file   Social History Narrative   Lives at home w/ her husband, daughter and grandson   Right-handed   Caffeine: 16 oz soda daily     PHYSICAL EXAM  GENERAL EXAM/CONSTITUTIONAL: Vitals:  Vitals:   01/16/17 1037  BP: (!) 132/93  Pulse: 63  Weight: 200 lb 3.2 oz (90.8 kg)  Height: 5\' 4"  (1.626 m)     Body mass index is 34.36 kg/m.  No exam data present  Patient is in no distress; well developed, nourished and groomed; neck is supple  CARDIOVASCULAR:  Examination of carotid arteries is normal; no carotid bruits  Regular rate and rhythm, no murmurs  Examination of peripheral vascular system by observation and palpation is normal  EYES:  Ophthalmoscopic exam of optic discs and posterior segments is normal; no papilledema or hemorrhages  MUSCULOSKELETAL:  Gait, strength, tone, movements noted in Neurologic exam below  NEUROLOGIC: MENTAL STATUS:  No flowsheet data found.  awake, alert, oriented to person, place and time  recent and remote memory intact  normal attention and concentration  language fluent, comprehension intact, naming intact,   fund of knowledge appropriate  CRANIAL NERVE:   2nd - no papilledema on fundoscopic exam  2nd, 3rd, 4th, 6th - pupils equal and reactive to light, visual fields full to confrontation, extraocular muscles intact, no nystagmus  5th - facial sensation symmetric  7th - facial strength symmetric  8th - hearing  intact  9th - palate elevates symmetrically, uvula midline  11th - shoulder shrug symmetric  12th - tongue protrusion midline  MOTOR:   normal bulk and tone, full strength in the BUE, BLE  SENSORY:   normal and symmetric to light touch, temperature, vibration  COORDINATION:   finger-nose-finger, fine finger movements normal  REFLEXES:   deep tendon reflexes present and symmetric  GAIT/STATION:   narrow based gait; romberg is negative    DIAGNOSTIC DATA (LABS, IMAGING, TESTING) - I reviewed patient records, labs, notes, testing and imaging myself where available.  Lab Results  Component Value Date   WBC 6.4 12/30/2016   HGB 14.4 12/30/2016   HCT 42.4 12/30/2016   MCV 92.4 12/30/2016   PLT 317 12/30/2016      Component Value Date/Time   NA 141 12/30/2016 1506   NA 138 05/09/2016 1332   K 4.4 12/30/2016 1506   K 3.6 05/09/2016  1332   CL 108 12/30/2016 1506   CO2 22 12/30/2016 1506   CO2 20 (L) 05/09/2016 1332   GLUCOSE 71 12/30/2016 1506   GLUCOSE 126 05/09/2016 1332   BUN 13 12/30/2016 1506   BUN 12.2 05/09/2016 1332   CREATININE 0.98 12/30/2016 1506   CREATININE 0.9 05/09/2016 1332   CALCIUM 8.8 12/30/2016 1506   CALCIUM 9.1 05/09/2016 1332   PROT 6.5 12/30/2016 1506   PROT 7.2 05/09/2016 1332   ALBUMIN 4.1 12/30/2016 1506   ALBUMIN 3.7 05/09/2016 1332   AST 36 (H) 12/30/2016 1506   AST 34 05/09/2016 1332   ALT 38 (H) 12/30/2016 1506   ALT 28 05/09/2016 1332   ALKPHOS 109 12/30/2016 1506   ALKPHOS 121 05/09/2016 1332   BILITOT 0.5 12/30/2016 1506   BILITOT 0.47 05/09/2016 1332   GFRNONAA 68 12/30/2016 1506   GFRAA 79 12/30/2016 1506   Lab Results  Component Value Date   CHOL 162 03/24/2016   HDL 38 (L) 03/24/2016   LDLCALC 107 03/24/2016   TRIG 85 03/24/2016   CHOLHDL 4.3 03/24/2016   Lab Results  Component Value Date   HGBA1C 5.4 03/24/2016   Lab Results  Component Value Date   VITAMINB12 164 (L) 12/30/2016   Lab Results   Component Value Date   TSH 2.31 03/24/2016        ASSESSMENT AND PLAN  51 y.o. year old female here with new onset seizure, with blood glucose of 66, possibly related to hypoglycemia but not clear. Will proceed with further workup with MRI of the brain, EEG. Advised patient to follow driving restriction of at least 6 months seizure-free as per [facility]   Dx:  1. Seizures ([place])   2. Hypoglycemia      PLAN:  - check MRI brain and EEG  - According to [place] law, you can not drive unless you are seizure / syncope free for at least 6 months and under physician's care.   - Please maintain precautions. Do not participate in activities where a loss of awareness could harm you or someone else. No swimming alone, no tub bathing, no hot tubs, no driving, no operating motorized vehicles (cars, ATVs, motocycles, etc), lawnmowers, power tools or firearms. No standing at heights, such as rooftops, ladders or stairs. Avoid hot objects such as stoves, heaters, open fires. Wear a helmet when riding a bicycle, scooter, skateboard, etc. and avoid areas of traffic. Set your water heater to 120 degrees or less.  Orders Placed This Encounter  Procedures  . MR BRAIN W WO CONTRAST  . EEG adult   Meds ordered this encounter  Medications  . ALPRAZolam (XANAX) 0.5 MG tablet    Sig: Take 1 tablet (0.5 mg total) by mouth as needed for anxiety (for sedation before MRI scan; take 1 hour before scan; may repeat 15 min before scan).    Dispense:  3 tablet    Refill:  0   Return in about 3 months (around 04/18/2017).  I reviewed labs, notes, records myself. I summarized findings and reviewed with patient, for this high risk condition (new onset seizure) requiring high complexity decision making.    Penni Bombard, MD 3/76/2831, 51:76 AM Certified in Neurology, Neurophysiology and Neuroimaging  [facility] [address] [place], [place] [address] [phone]

## 2017-01-16 NOTE — Telephone Encounter (Signed)
Patient calling to check on status of referral.   Advised her it's in review by Dwyane Dee.   Please update.  -LL

## 2017-01-16 NOTE — Patient Instructions (Signed)
Thank you for coming to see [place] at [facility]. I hope we have been able to provide you high quality care today.  You may receive a patient satisfaction survey over the next few weeks. We would appreciate your feedback and comments so that we may continue to improve ourselves and the health of our patients.  - check MRI brain and EEG  - According to [place] law, you can not drive unless you are seizure / syncope free for at least 6 months and under physician's care.   - Please maintain precautions. Do not participate in activities where a loss of awareness could harm you or someone else. No swimming alone, no tub bathing, no hot tubs, no driving, no operating motorized vehicles (cars, ATVs, motocycles, etc), lawnmowers, power tools or firearms. No standing at heights, such as rooftops, ladders or stairs. Avoid hot objects such as stoves, heaters, open fires. Wear a helmet when riding a bicycle, scooter, skateboard, etc. and avoid areas of traffic. Set your water heater to 120 degrees or less.   ~~~~~~~~~~~~~~~~~~~~~~~~~~~~~~~~~~~~~~~~~~~~~~~~~~~~~~~~~~~~~~~~~  DR. Qadir Folks'S GUIDE TO HAPPY AND HEALTHY LIVING These are some of my general health and wellness recommendations. Some of them may apply to you better than others. Please use common sense as you try these suggestions and feel free to ask me any questions.   ACTIVITY/FITNESS Mental, social, emotional and physical stimulation are very important for brain and body health. Try learning a new activity (arts, music, language, sports, games).  Keep moving your body to the best of your abilities. You can do this at home, inside or outside, the park, community center, gym or anywhere you like. Consider a physical therapist or personal trainer to get started. Consider the app Sworkit. Fitness trackers such as smart-watches, smart-phones or Fitbits can help as well.   NUTRITION Eat more plants: colorful vegetables, nuts, seeds and  berries.  Eat less sugar, salt, preservatives and processed foods.  Avoid toxins such as cigarettes and alcohol.  Drink water when you are thirsty. Warm water with a slice of lemon is an excellent morning drink to start the day.  Consider these websites for more information The Nutrition Source (https://www.henry-hernandez.biz/) Precision Nutrition (WindowBlog.ch)   RELAXATION Consider practicing mindfulness meditation or other relaxation techniques such as deep breathing, prayer, yoga, tai chi, massage. See website mindful.org or the apps Headspace or Calm to help get started.   SLEEP Try to get at least 7-8+ hours sleep per day. Regular exercise and reduced caffeine will help you sleep better. Practice good sleep hygeine techniques. See website sleep.org for more information.   PLANNING Prepare estate planning, living will, healthcare POA documents. Sometimes this is best planned with the help of an attorney. Theconversationproject.org and agingwithdignity.org are excellent resources.

## 2017-01-19 ENCOUNTER — Encounter: Payer: Self-pay | Admitting: Family Medicine

## 2017-01-19 NOTE — Telephone Encounter (Signed)
Patient's husband calling to check status of referral. Please call and advise.

## 2017-01-20 ENCOUNTER — Ambulatory Visit (HOSPITAL_COMMUNITY)
Admission: RE | Admit: 2017-01-20 | Discharge: 2017-01-20 | Disposition: A | Discharge status: Home / Self Care | Payer: 59 | Source: Ambulatory Visit | Attending: Cardiovascular Disease | Admitting: Cardiovascular Disease

## 2017-01-20 DIAGNOSIS — M79652 Pain in left thigh: Secondary | ICD-10-CM | POA: Diagnosis not present

## 2017-01-20 DIAGNOSIS — R202 Paresthesia of skin: Secondary | ICD-10-CM | POA: Diagnosis not present

## 2017-01-26 ENCOUNTER — Ambulatory Visit (INDEPENDENT_AMBULATORY_CARE_PROVIDER_SITE_OTHER): Payer: 59 | Admitting: Diagnostic Neuroimaging

## 2017-01-26 ENCOUNTER — Ambulatory Visit (INDEPENDENT_AMBULATORY_CARE_PROVIDER_SITE_OTHER): Payer: 59

## 2017-01-26 DIAGNOSIS — R569 Unspecified convulsions: Secondary | ICD-10-CM

## 2017-01-26 MED ORDER — GADOBENATE DIMEGLUMINE 529 MG/ML IV SOLN
20.0000 mL | Freq: Once | INTRAVENOUS | Status: AC | PRN
  Administered 2017-01-26: 19 mL via INTRAVENOUS

## 2017-01-27 ENCOUNTER — Telehealth: Payer: Self-pay | Admitting: *Deleted

## 2017-01-27 NOTE — Telephone Encounter (Signed)
Spoke with patient and informed her that her MRI brain showed unremarkable imaging results, no major findings. She stated she had EEG the same day as MRI.  Advised her she will get a call when those results are available. She verbalized understanding, appreciation.

## 2017-01-29 ENCOUNTER — Ambulatory Visit (INDEPENDENT_AMBULATORY_CARE_PROVIDER_SITE_OTHER): Payer: 59 | Admitting: Family Medicine

## 2017-01-29 VITALS — BP 123/88 | HR 65 | Ht 64.0 in | Wt 202.8 lb

## 2017-01-29 DIAGNOSIS — E538 Deficiency of other specified B group vitamins: Secondary | ICD-10-CM

## 2017-01-29 MED ORDER — CYANOCOBALAMIN 1000 MCG/ML IJ SOLN
1000.0000 ug | Freq: Once | INTRAMUSCULAR | Status: AC
  Administered 2017-01-29: 1000 ug via INTRAMUSCULAR

## 2017-01-29 NOTE — Progress Notes (Signed)
   Subjective:    Patient ID: Stacy Woods, female    DOB: 16-Oct-1965, 51 y.o.   MRN: [id number]  HPI Pt is here for a vitamin B 12 injection. Denies gastrointestinal problems or dizziness.     Review of Systems     Objective:   Physical Exam        Assessment & Plan:  Pt tolerated injection well and without complications. Pt advised to schedule next injection in 30 days.

## 2017-02-01 NOTE — Progress Notes (Signed)
Patient ID: Stacy Woods, female   DOB: 17-Mar-1966, 51 y.o.   MRN: [id number]            Referring physician: Georgina Snell  Chief complaint: Low blood sugars  History of Present Illness:  She started having symptoms of low blood sugars in early 2017  The symptoms generally occur about 1-1/2 or 2 hours after eating especially breakfast She gets symptoms of feeling shaky, jittery and sweaty and sometimes may have a feeling of confusion and difficulty with speaking clearly.  She says her lowest blood sugar has been 40 with blood sugar monitoring She does not have any symptoms on a regular basis Most of her symptoms are between breakfast and lunch and sometimes in the afternoon She uses 4 glucose tablets to relieve her symptoms and this usually works well  She did have a seizure on 01/11/17 which was about an hour and a half after eating a biscuit and drinking regular soft drinks She did not have any typical symptoms of feeling jittery and her hands shaking but she started feeling shaking all over.  She was sweating a little but had been walking in the warm humid atmosphere.  She apparently fell backwards and had 2 grand mal seizure for about 3 or 4 minutes without any warning Her blood sugar done by paramedics after the episode was 66  Blood sugar review: Her meter has incorrect time programmed and recently in the last month has had one low blood sugar around 9 AM of 57 last Friday and twice late afternoon with lowest reading 57 Her normal fasting blood sugars range from 84-1 03  Although she had lost about 130 pounds with her bariatric surgery in 2011 she started gaining back weight at least about 2 years ago which was gradually She has however gained about 12 pounds in the last month or 2  At breakfast she is eating either a granola bar when she gets to work or a fast food burrito At lunchtime she is usually eating a salad and a lean meat and at dinnertime usually having a grilled protein and not  much carbohydrate She may have peanut butter crackers mid morning if she feels the need to otherwise usually not a snack in the afternoons  Wt Readings from Last 3 Encounters:  02/02/17 200 lb 12.8 oz (91.1 kg)  01/29/17 202 lb 12.8 oz (92 kg)  01/16/17 200 lb 3.2 oz (90.8 kg)     Past Medical History:  Diagnosis Date  . Anemia   . Anemia, iron deficiency 05/02/2015  . Fibromyalgia   . History of gastric bypass 05/09/2016  . Hypertension   . Malabsorption of iron 05/09/2016  . PONV (postoperative nausea and vomiting)   . Surgical menopause 03/24/2016    Past Surgical History:  Procedure Laterality Date  . ABDOMINAL HYSTERECTOMY  1997  . APPENDECTOMY    . CESAREAN SECTION  [id number]  . CHOLECYSTECTOMY    . GASTRIC BYPASS  2011  . KNEE ARTHROSCOPY  2002  . TONSILLECTOMY  1977  . ULNAR NERVE TRANSPOSITION Right 06/12/2015   Procedure: RIGHT CUBITAL TUNNEL RELEASE;  Surgeon: Milly Jakob, MD;  Location: [place];  Service: Orthopedics;  Laterality: Right;    Family History  Problem Relation Age of Onset  . Diabetes Father   . Hypertension Father   . Stroke Father   . Heart failure Father   . Breast cancer Mother     Social History:  reports that she has  never smoked. She has never used smokeless tobacco. She reports that she does not drink alcohol or use drugs.  Allergies:  Allergies  Allergen Reactions  . Mobic [Meloxicam] Swelling  . Shellfish Allergy Nausea Only and Swelling  . Lyrica [Pregabalin] Other (See Comments)    "zombie"  . Percocet [Oxycodone-Acetaminophen] Itching    Allergies as of 02/02/2017      Reactions   Mobic [meloxicam] Swelling   Shellfish Allergy Nausea Only, Swelling   Lyrica [pregabalin] Other (See Comments)   "zombie"   Percocet [oxycodone-acetaminophen] Itching      Medication List       Accurate as of 02/02/17  9:57 AM. Always use your most recent med list.          AMBULATORY NON FORMULARY MEDICATION One  Touch Glucometer per insurance formulary. One month of testing strips, and lancets used to check blood sugar daily.   escitalopram 10 MG tablet Commonly known as:  LEXAPRO TAKE 1 TABLET (10 MG TOTAL) BY MOUTH DAILY.   glucagon 1 MG injection Inject 1 mg into the muscle once as needed. For low blood sugar   magnesium oxide 400 MG tablet Commonly known as:  MAG-OX Take 2 tablets (800 mg total) by mouth at bedtime.       LABS:  No visits with results within 1 Week(s) from this visit.  Latest known visit with results is:  Office Visit on 12/30/2016  Component Date Value Ref Range Status  . WBC 12/30/2016 6.4  3.8 - 10.8 K/uL Final  . RBC 12/30/2016 4.59  3.80 - 5.10 MIL/uL Final  . Hemoglobin 12/30/2016 14.4  11.7 - 15.5 g/dL Final  . HCT 12/30/2016 42.4  35.0 - 45.0 % Final  . MCV 12/30/2016 92.4  80.0 - 100.0 fL Final  . MCH 12/30/2016 31.4  27.0 - 33.0 pg Final  . MCHC 12/30/2016 34.0  32.0 - 36.0 g/dL Final  . RDW 12/30/2016 14.2  11.0 - 15.0 % Final  . Platelets 12/30/2016 317  140 - 400 K/uL Final  . MPV 12/30/2016 10.8  7.5 - 12.5 fL Final  . Sodium 12/30/2016 141  135 - 146 mmol/L Final  . Potassium 12/30/2016 4.4  3.5 - 5.3 mmol/L Final  . Chloride 12/30/2016 108  98 - 110 mmol/L Final  . CO2 12/30/2016 22  20 - 31 mmol/L Final  . Glucose, Bld 12/30/2016 71  65 - 99 mg/dL Final  . BUN 12/30/2016 13  7 - 25 mg/dL Final  . Creat 12/30/2016 0.98  0.50 - 1.10 mg/dL Final  . Total Bilirubin 12/30/2016 0.5  0.2 - 1.2 mg/dL Final  . Alkaline Phosphatase 12/30/2016 109  33 - 115 U/L Final  . AST 12/30/2016 36* 10 - 35 U/L Final  . ALT 12/30/2016 38* 6 - 29 U/L Final  . Total Protein 12/30/2016 6.5  6.1 - 8.1 g/dL Final  . Albumin 12/30/2016 4.1  3.6 - 5.1 g/dL Final  . Calcium 12/30/2016 8.8  8.6 - 10.2 mg/dL Final  . GFR, Est African American 12/30/2016 79  >=60 mL/min Final  . GFR, Est Non African American 12/30/2016 68  >=60 mL/min Final  . Vitamin B-12 12/30/2016 164*  200 - 1,100 pg/mL Final  . Folate 12/30/2016 9.4  >5.4 ng/mL Final   Comment: Reference Range >17 years:   Low: <3.4 ng/mL              Borderline: 3.4-5.4 ng/mL  Normal: >5.4 ng/mL     . Methylmalonic Acid, Quant 12/30/2016 392* 87 - 318 nmol/L Final  . Homocysteine 12/30/2016 17.3* <10.4 umol/L Final   Comment: Homocysteine is increased by functional deficiency of folate or vitamin B12.  Testing for methylmalonic acid differentiates between these deficiencies.  Other causes of increased homocysteine include renal failure, folate antagonists such as methotrexate and phenytoin, and exposure to nitrous oxide.         Review of Systems  Constitutional: Positive for weight gain.  Respiratory: Negative for shortness of breath.   Cardiovascular: Negative for palpitations.  Gastrointestinal: Positive for constipation.  Endocrine: Positive for fatigue.  Musculoskeletal: Positive for muscle aches.  Skin: Negative for rash.  Neurological: Negative for weakness.  Psychiatric/Behavioral:       Has an anxiety and some depression, at times will feel stressed   Hematology: Recently started a B12 injection for her level of 164  PHYSICAL EXAM:  BP 120/88   Pulse 75   Ht 5\' 4"  (1.626 m)   Wt 200 lb 12.8 oz (91.1 kg)   SpO2 98%   BMI 34.47 kg/m   GENERAL:  She has moderate obesity without any cushingoid  Features    No pallor, clubbing, lymphadenopathy or edema.  Skin:  no rash or pigmentation.  EYES:  Externally normal.  Fundii:  normal discs and vessels.  ENT: Oral mucosa and tongue normal.  THYROID:  Not palpable.  HEART:  Normal  S1 and S2; no murmur or click.  CHEST:  Normal shape.  Lungs: Vescicular breath sounds heard equally.  No crepitations/ wheeze.  ABDOMEN:  No distention.  Liver and spleen not palpable.  No other mass or tenderness.  NEUROLOGICAL: .Reflexes are bilaterally at biceps and at ankles.  JOINTS:  Normal.   ASSESSMENT:   Episodes  of reactive hypoglycemia likely related to history of gastric bypass surgery Most of her episodes occur about 2 hours after breakfast and occasionally after lunch, these are likely more common with her having more carbohydrate and less protein with her meals Although her episode of seizure may have been hyperglycemia this is not confirmed and patient did have a regular soft drink before that episode   Discussed because of reactive hypoglycemia related to gastric bypass surgery  Described the  occurrence of dumping and excessive insulin production with carbohydrate intake simultaneously as the cause of hypoglycemia  PLAN:  She will need to reduce carbohydrate intake especially at breakfast and with regular soft drinks Instead of granola bar she will need to have a protein bar or have a mixed meal with eggs and toast Trial of Precose at mealtimes especially breakfast and lunch She will start with 25 mg tablet and use a half tablet as a trial, see instructions below She will be scheduled to see the nutritionist  Follow-up in 4 weeks to reassess her progress  Also need follow-up TSH/free T4 for her fatigue symptoms  Consultation note sent to the referring physician  Patient Instructions  Avoid high carbohydrate breakfast bars and use protein bar or eggs and toast in the morning  No regular soft drinks or sweetened drinks, avoid juice also  Have a protein with every meal and if going more than 3-4 hours between meals have a protein snack like peanut butter crackers  Start regular exercise with walking  ACARBOSE: Start taking half a tablet at the start of breakfast for the first couple of days and then a whole tablet at breakfast  After 1 week  start half tablet at lunch for a few days and then increase this to full tablet also    Stacy Woods 02/02/2017, 9:57 AM

## 2017-02-02 ENCOUNTER — Encounter: Payer: Self-pay | Admitting: Endocrinology

## 2017-02-02 ENCOUNTER — Ambulatory Visit (INDEPENDENT_AMBULATORY_CARE_PROVIDER_SITE_OTHER): Payer: 59 | Admitting: Endocrinology

## 2017-02-02 VITALS — BP 120/88 | HR 75 | Ht 64.0 in | Wt 200.8 lb

## 2017-02-02 DIAGNOSIS — E161 Other hypoglycemia: Secondary | ICD-10-CM

## 2017-02-02 DIAGNOSIS — Z9884 Bariatric surgery status: Secondary | ICD-10-CM | POA: Diagnosis not present

## 2017-02-02 DIAGNOSIS — R5383 Other fatigue: Secondary | ICD-10-CM | POA: Diagnosis not present

## 2017-02-02 MED ORDER — ACARBOSE 25 MG PO TABS: 25.0000 mg | ORAL_TABLET | Freq: Three times a day (TID) | ORAL | 1 refills | Status: DC

## 2017-02-02 MED FILL — ACARBOSE 25 MG TABLET: 25 | 30 days supply | Qty: 90 | Fill #0

## 2017-02-02 NOTE — Patient Instructions (Signed)
Avoid high carbohydrate breakfast bars and use protein bar or eggs and toast in the morning  No regular soft drinks or sweetened drinks, avoid juice also  Have a protein with every meal and if going more than 3-4 hours between meals have a protein snack like peanut butter crackers  Start regular exercise with walking  ACARBOSE: Start taking half a tablet at the start of breakfast for the first couple of days and then a whole tablet at breakfast  After 1 week start half tablet at lunch for a few days and then increase this to full tablet also

## 2017-02-03 ENCOUNTER — Encounter: Payer: Self-pay | Admitting: Family Medicine

## 2017-02-03 MED FILL — ESCITALOPRAM 10 MG TABLET: 10 | 90 days supply | Qty: 90 | Fill #3

## 2017-02-04 ENCOUNTER — Encounter: Payer: Self-pay | Admitting: Diagnostic Neuroimaging

## 2017-02-05 ENCOUNTER — Encounter: Payer: Self-pay | Admitting: Endocrinology

## 2017-02-06 ENCOUNTER — Other Ambulatory Visit: Payer: Self-pay

## 2017-02-06 MED ORDER — LANCETS 30G MISC: 99 refills | Status: AC

## 2017-02-06 MED ORDER — BLOOD GLUCOSE MONITOR KIT: PACK | 0 refills | Status: DC

## 2017-02-06 MED ORDER — GLUCOSE BLOOD VI STRP: ORAL_STRIP | 99 refills | Status: DC

## 2017-02-06 MED FILL — FREESTYLE LANCETS: 90 days supply | Qty: 100 | Fill #0

## 2017-02-06 MED FILL — FREESTYLE LITE METER: 30 days supply | Qty: 1 | Fill #0

## 2017-02-06 MED FILL — FREESTYLE LITE TEST STRIP: 50 days supply | Qty: 50 | Fill #0

## 2017-02-06 NOTE — Procedures (Signed)
   [facility]  EEG (ELECTROENCEPHALOGRAM) REPORT   STUDY DATE: 01/26/17 PATIENT NAME: Stacy Woods DOB: 1965/12/28 MRN: [id number]  ORDERING CLINICIAN: Andrey Spearman, MD   TECHNOLOGIST: Laretta Alstrom  TECHNIQUE: Electroencephalogram was recorded utilizing standard 10-20 system of lead placement and reformatted into average and bipolar montages.  RECORDING TIME: 31 minutes  ACTIVATION: hyperventilation and photic stimulation  CLINICAL INFORMATION: 51 year old female here for evaluation of seizure.  FINDINGS: Posterior dominant background rhythms, which attenuate with eye opening, ranging 9-11 hertz and 35-40 microvolts. No focal, lateralizing, epileptiform activity or seizures are seen. Patient recorded in the awake and drowsy state. EKG channel shows regular rhythm of 60 beats per minute.   IMPRESSION:  Normal EEG in the awake and drowsy states.    INTERPRETING PHYSICIAN:  Penni Bombard, MD Certified in Neurology, Neurophysiology and Neuroimaging  [facility] [address] [place], [place] [address] [phone]

## 2017-02-10 ENCOUNTER — Telehealth: Payer: Self-pay | Admitting: *Deleted

## 2017-02-10 NOTE — Telephone Encounter (Signed)
LVM informing patient her EEG results are normal. Reminded her of Dr [facility] plan and advice during her last office visit, reviewed those. Advised she call back prior to her FU in Nov for any questions or problems.

## 2017-02-27 ENCOUNTER — Ambulatory Visit (INDEPENDENT_AMBULATORY_CARE_PROVIDER_SITE_OTHER): Payer: 59 | Admitting: Family Medicine

## 2017-02-27 VITALS — BP 118/78 | HR 71 | Wt 198.0 lb

## 2017-02-27 DIAGNOSIS — E538 Deficiency of other specified B group vitamins: Secondary | ICD-10-CM

## 2017-02-27 MED ORDER — CYANOCOBALAMIN 1000 MCG/ML IJ SOLN
1000.0000 ug | Freq: Once | INTRAMUSCULAR | Status: AC
  Administered 2017-02-27: 1000 ug via INTRAMUSCULAR

## 2017-03-05 ENCOUNTER — Encounter: Payer: 59 | Admitting: Dietician

## 2017-03-11 ENCOUNTER — Ambulatory Visit: Payer: 59 | Admitting: Endocrinology

## 2017-03-13 NOTE — Progress Notes (Signed)
Note opened in error.

## 2017-03-16 ENCOUNTER — Encounter: Payer: Self-pay | Admitting: Family Medicine

## 2017-03-16 DIAGNOSIS — I10 Essential (primary) hypertension: Secondary | ICD-10-CM

## 2017-03-16 DIAGNOSIS — E538 Deficiency of other specified B group vitamins: Secondary | ICD-10-CM

## 2017-03-16 DIAGNOSIS — Z9884 Bariatric surgery status: Secondary | ICD-10-CM

## 2017-03-16 DIAGNOSIS — R7303 Prediabetes: Secondary | ICD-10-CM

## 2017-03-16 DIAGNOSIS — E162 Hypoglycemia, unspecified: Secondary | ICD-10-CM

## 2017-03-16 NOTE — Telephone Encounter (Signed)
Called Pt regarding her blood sugar readings. Pt states the ones listed in the MyChart message are accurate, influencing a reading of 26. Pt states she took glucose tabs and brought it back up. Advised Pt for any reading less that 70 she needs to seek immediate medical treatment, Pt states "then I might as well get a bed put in your office."  Went over Pt's eating schedule, she is eating 6 times a day - mostly protein since she has had gastric bypass. Pt reports she checks her sugars when she feels symptomatic, which includes confusion and temperature change.   Advised Pt I would place lab orders. Asked her to bring her glucometer with her to the office visit so we can check the accuracy. Strongly reiterated that if her sugar goes below 70, she needs medical treatment. Reminded if she gets another reading in the 20's she needs to call 911 immediately.

## 2017-03-17 DIAGNOSIS — I10 Essential (primary) hypertension: Secondary | ICD-10-CM | POA: Diagnosis not present

## 2017-03-17 DIAGNOSIS — E538 Deficiency of other specified B group vitamins: Secondary | ICD-10-CM | POA: Diagnosis not present

## 2017-03-17 DIAGNOSIS — E162 Hypoglycemia, unspecified: Secondary | ICD-10-CM | POA: Diagnosis not present

## 2017-03-17 DIAGNOSIS — R7303 Prediabetes: Secondary | ICD-10-CM | POA: Diagnosis not present

## 2017-03-18 ENCOUNTER — Encounter: Payer: Self-pay | Admitting: Family Medicine

## 2017-03-18 DIAGNOSIS — E162 Hypoglycemia, unspecified: Secondary | ICD-10-CM

## 2017-03-18 MED ORDER — GLUCOSE BLOOD VI STRP
ORAL_STRIP | 11 refills | Status: DC
Start: 2017-03-18 — End: 2020-02-17

## 2017-03-18 MED FILL — FREESTYLE LITE TEST STRIP: 25 days supply | Qty: 100 | Fill #0

## 2017-03-19 LAB — HEMOGLOBIN A1C
Hgb A1c MFr Bld: 5.1 % of total Hgb (ref <5.7)
Mean Plasma Glucose: 100 (calc)
eAG (mmol/L): 5.5 (calc)

## 2017-03-19 LAB — COMPLETE METABOLIC PANEL WITH GFR
AG Ratio: 1.6 (calc) (ref 1.0–2.5)
ALBUMIN MSPROF: 4 g/dL (ref 3.6–5.1)
ALT: 39 U/L — ABNORMAL HIGH (ref 6–29)
AST: 31 U/L (ref 10–35)
Alkaline phosphatase (APISO): 127 U/L — ABNORMAL HIGH (ref 33–115)
BILIRUBIN TOTAL: 0.5 mg/dL (ref 0.2–1.2)
BUN: 9 mg/dL (ref 7–25)
CHLORIDE: 110 mmol/L (ref 98–110)
CO2: 24 mmol/L (ref 20–32)
Calcium: 9.4 mg/dL (ref 8.6–10.2)
Creat: 0.89 mg/dL (ref 0.50–1.10)
GFR, EST AFRICAN AMERICAN: 88 mL/min/{1.73_m2} (ref >=60)
GFR, Est Non African American: 76 mL/min/{1.73_m2} (ref >=60)
GLOBULIN: 2.5 g/dL (ref 1.9–3.7)
Glucose, Bld: 59 mg/dL — ABNORMAL LOW (ref 65–99)
Potassium: 3.7 mmol/L (ref 3.5–5.3)
SODIUM: 142 mmol/L (ref 135–146)
Total Protein: 6.5 g/dL (ref 6.1–8.1)

## 2017-03-19 LAB — MAGNESIUM: MAGNESIUM: 1.8 mg/dL (ref 1.5–2.5)

## 2017-03-19 LAB — FOLATE: FOLATE: 5.7 ng/mL

## 2017-03-19 LAB — METHYLMALONIC ACID, SERUM: Methylmalonic Acid, Quant: 110 nmol/L (ref 87–318)

## 2017-03-19 LAB — VITAMIN B12: Vitamin B-12: 596 pg/mL (ref 200–1100)

## 2017-03-19 LAB — FRUCTOSAMINE: FRUCTOSAMINE: 210 umol/L (ref 190–270)

## 2017-03-23 ENCOUNTER — Ambulatory Visit (INDEPENDENT_AMBULATORY_CARE_PROVIDER_SITE_OTHER): Payer: 59 | Admitting: Family Medicine

## 2017-03-23 VITALS — BP 117/79 | HR 60 | Wt 201.0 lb

## 2017-03-23 DIAGNOSIS — E162 Hypoglycemia, unspecified: Secondary | ICD-10-CM

## 2017-03-23 DIAGNOSIS — Z23 Encounter for immunization: Secondary | ICD-10-CM

## 2017-03-23 NOTE — Progress Notes (Signed)
Stacy Woods is a 51 y.o. female who presents to [facility] today for follow-up hypoglycemia. Patient has significant hypoglycemia thought to be due to post gastric bypass insulin overproduction. She was prescribed Acarbose by endocrinology. She has not yet started it because she was concerned about taking the medication designed to lower blood sugar. She continues to express episodes of cycling hypo-and hyperglycemia.   Past Medical History:  Diagnosis Date  . Anemia   . Anemia, iron deficiency 05/02/2015  . Fibromyalgia   . History of gastric bypass 05/09/2016  . Hypertension   . Malabsorption of iron 05/09/2016  . PONV (postoperative nausea and vomiting)   . Surgical menopause 03/24/2016   Past Surgical History:  Procedure Laterality Date  . ABDOMINAL HYSTERECTOMY  1997  . APPENDECTOMY    . CESAREAN SECTION  [id number]  . CHOLECYSTECTOMY    . GASTRIC BYPASS  2011  . KNEE ARTHROSCOPY  2002  . TONSILLECTOMY  1977  . ULNAR NERVE TRANSPOSITION Right 06/12/2015   Procedure: RIGHT CUBITAL TUNNEL RELEASE;  Surgeon: Milly Jakob, MD;  Location: [facility];  Service: Orthopedics;  Laterality: Right;   Social History  Substance Use Topics  . Smoking status: Never Smoker  . Smokeless tobacco: Never Used  . Alcohol use No   family history includes Breast cancer in her mother; Diabetes in her father; Heart failure in her father; Hypertension in her father; Stroke in her father.  ROS as above:  Medications: Current Outpatient Prescriptions  Medication Sig Dispense Refill  . AMBULATORY NON FORMULARY MEDICATION One Touch Glucometer per insurance formulary. One month of testing strips, and lancets used to check blood sugar daily. 1 Units prn  . blood glucose meter kit and supplies KIT Check fasting blood sugar daily. 1 each 0  . escitalopram (LEXAPRO) 10  MG tablet TAKE 1 TABLET (10 MG TOTAL) BY MOUTH DAILY. 90 tablet 3  . glucagon 1 MG injection Inject 1 mg into the muscle once as needed. For low blood sugar 1 each 12  . glucose blood (IGLUCOSE TEST STRIPS) test strip Check fasting blood sugar 3-4 times daily. Dx: E16.2 100 each 11  . Lancets 30G MISC Check fasting blood sugar daily 50 each PRN  . magnesium oxide (MAG-OX) 400 MG tablet Take 2 tablets (800 mg total) by mouth at bedtime. 90 tablet 3  . acarbose (PRECOSE) 25 MG tablet   1   No current facility-administered medications for this visit.    Allergies  Allergen Reactions  . Mobic [Meloxicam] Swelling  . Shellfish Allergy Nausea Only and Swelling  . Lyrica [Pregabalin] Other (See Comments)    "zombie"  . Percocet [Oxycodone-Acetaminophen] Itching    Health Maintenance Health Maintenance  Topic Date Due  . HIV Screening  02/26/1983  . INFLUENZA VACCINE  01/07/2018 (Originally 01/07/2017)  . TETANUS/TDAP  03/24/2026     Exam:  BP 117/79   Pulse 60   Wt 201 lb (91.2 kg)   BMI 34.50 kg/m  Gen: Well NAD HEENT: EOMI,  MMM Lungs: Normal work of breathing. CTABL Heart: RRR no MRG Abd: NABS, Soft. Nondistended, Nontender Exts: Brisk capillary refill, warm and well perfused.    No results found for this or any previous visit (from the past 72 hour(s)). No results found.    Assessment and Plan: 51 y.o. female with Hypoglycemia: I think it's reasonable that this is likely secondary to gastric bypass insulin overproduction.  We explained that delaying absorption of carbohydrates will likely help to control hypoglycemia. Additionally we discussed eating a lower carbohydrate diet will also be helpful. We discussed backup plan in safety on this medication. Recommend glucose tablets as regular sugar will not be as effective to correct hypoglycemia. Recheck in a month or so. If not better will likely follow back up with endocrinology. Patient notes that she did not feel that her  endocrinologist communicated very well and she would like to see a different endocrinologist for second opinion in the future. [facility] should be in network with [facility]..   Orders Placed This Encounter  Procedures  . Flu Vaccine QUAD 36+ mos IM   Meds ordered this encounter  Medications  . acarbose (PRECOSE) 25 MG tablet    Refill:  1     Discussed warning signs or symptoms. Please see discharge instructions. Patient expresses understanding.  I spent 25 minutes with this patient, greater than 50% was face-to-face time counseling regarding treatment plan.

## 2017-03-23 NOTE — Patient Instructions (Signed)
Thank you for coming in today. Start the [facility]  Let me know if not better.  We will refer to [facility] if not better.   Recheck sooner if needed.

## 2017-03-30 ENCOUNTER — Ambulatory Visit (INDEPENDENT_AMBULATORY_CARE_PROVIDER_SITE_OTHER): Payer: 59 | Admitting: Family Medicine

## 2017-03-30 VITALS — BP 125/79 | HR 70

## 2017-03-30 DIAGNOSIS — E538 Deficiency of other specified B group vitamins: Secondary | ICD-10-CM | POA: Diagnosis not present

## 2017-03-30 MED ORDER — CYANOCOBALAMIN 1000 MCG/ML IJ SOLN
1000.0000 ug | Freq: Once | INTRAMUSCULAR | Status: AC
  Administered 2017-03-30: 1000 ug via INTRAMUSCULAR

## 2017-03-30 NOTE — Progress Notes (Signed)
Pt came into clinic today for B12 injection. Pt reports she received her first two injections with no negative side effects. Then the injection last month caused her to have itchy whelps on her feet/ankles, which lasted about 5 days. Pt was under a lot of stress at the time, her father was in the hospital, so she was advised by PCP to try one more B12 injection. If the same reaction occurs, we will discontinue injections. Pt tolerated injection in right arm well, no immediate complications. Advised to contact clinic with any questions/concerns. Pt advised to follow up in 4 weeks if she tolerates injection well. Verbalized understanding.

## 2017-04-17 ENCOUNTER — Telehealth: Payer: 59 | Admitting: Nurse Practitioner

## 2017-04-17 DIAGNOSIS — J0100 Acute maxillary sinusitis, unspecified: Secondary | ICD-10-CM

## 2017-04-17 MED ORDER — AMOXICILLIN-POT CLAVULANATE 875-125 MG PO TABS: 1.0000 | ORAL_TABLET | Freq: Two times a day (BID) | ORAL | 0 refills | Status: DC

## 2017-04-17 MED FILL — AMOX-CLAV 875-125 MG TABLET: 875-125 | 10 days supply | Qty: 20 | Fill #0

## 2017-04-17 NOTE — Progress Notes (Signed)

## 2017-04-20 ENCOUNTER — Ambulatory Visit: Payer: 59 | Admitting: Diagnostic Neuroimaging

## 2017-04-20 ENCOUNTER — Encounter: Payer: Self-pay | Admitting: Diagnostic Neuroimaging

## 2017-04-20 VITALS — BP 120/81 | HR 86 | Ht 64.0 in | Wt 199.4 lb

## 2017-04-20 DIAGNOSIS — E162 Hypoglycemia, unspecified: Secondary | ICD-10-CM

## 2017-04-20 DIAGNOSIS — R569 Unspecified convulsions: Secondary | ICD-10-CM | POA: Diagnosis not present

## 2017-04-20 NOTE — Patient Instructions (Signed)
 -   monitor symptoms and sugar levels  - According to [place] law, you can not drive unless you are seizure / syncope free for at least 6 months and under physician's care (event occurred on 01/11/17)  - Please maintain precautions. Do not participate in activities where a loss of awareness could harm you or someone else. No swimming alone, no tub bathing, no hot tubs, no driving, no operating motorized vehicles (cars, ATVs, motocycles, etc), lawnmowers, power tools or firearms. No standing at heights, such as rooftops, ladders or stairs. Avoid hot objects such as stoves, heaters, open fires. Wear a helmet when riding a bicycle, scooter, skateboard, etc. and avoid areas of traffic. Set your water heater to 120 degrees or less.

## 2017-04-20 NOTE — Progress Notes (Signed)
[facility]  PATIENT: Stacy Woods DOB: 28-Apr-1966  REFERRING CLINICIAN: Steva Colder HISTORY FROM: patient REASON FOR VISIT: follow up    HISTORICAL  CHIEF COMPLAINT:  Chief Complaint  Patient presents with  . Follow-up  . Seizures    On augmentin for sinus infrctions.  No sz, just episodes of low blood sugurs.    HISTORY OF PRESENT ILLNESS:   UPDATE (04/20/17, VRP): Since last visit, doing well. Tolerating meds. No alleviating or aggravating factors. No further seizures. Has had a few low sugar events (20-60's) with hot, jittery, anxious feeling, but no associated LOC or seizures. She is workign with PCP and endocrinology to stabilize sugars.   PRIOR HPI (01/16/17): 51 year old right-handed female here for evaluation of seizure. I recently saw patient for EMG nerve conduction study for bilateral leg numbness and pain. Now patient presented for new issue. 01/11/17 patient was at an amusement park with family, was in the gift shop taking pictures. She fell sweaty, not well, started to fall backwards and collapse. Patient had convulsions, tongue biting. She is taken to the urgent care on site and transfer to local emergency room. Patient had CT scan, EKG, lab testing. Blood sugar was 66. Patient was noted to be slightly dehydrated and had low back magnesium level. Patient was amnestic for events leading up to and immediately following the seizure. Patient was diagnosed with probable hypoglycemic seizure and referred to me for further evaluation. Patient has had several episodes of intermittent hypoglycemia over the last 1 year. She has measured blood sugars as low as 40 in the past. This is patient's first seizure of life. Patient has family history of seizure in her sister, who had seizure related to cyst and trauma.Patient denies any personal history of CNS infections or brain trauma. Patient feels back to baseline. Patient has history of gastric bypass surgery 2011. She also  has fibromyalgia. She has had some insomnia lately. Some increased family stress lately.    REVIEW OF SYSTEMS: Full 14 system review of systems performed and negative with exception of: back pain blurred vision.    ALLERGIES: Allergies  Allergen Reactions  . Mobic [Meloxicam] Swelling  . Shellfish Allergy Nausea Only and Swelling  . Lyrica [Pregabalin] Other (See Comments)    "zombie"  . Percocet [Oxycodone-Acetaminophen] Itching    HOME MEDICATIONS: Outpatient Medications Prior to Visit  Medication Sig Dispense Refill  . acarbose (PRECOSE) 25 MG tablet   1  . AMBULATORY NON FORMULARY MEDICATION One Touch Glucometer per insurance formulary. One month of testing strips, and lancets used to check blood sugar daily. 1 Units prn  . amoxicillin-clavulanate (AUGMENTIN) 875-125 MG tablet Take 1 tablet 2 (two) times daily by mouth. 20 tablet 0  . blood glucose meter kit and supplies KIT Check fasting blood sugar daily. 1 each 0  . escitalopram (LEXAPRO) 10 MG tablet TAKE 1 TABLET (10 MG TOTAL) BY MOUTH DAILY. 90 tablet 3  . glucagon 1 MG injection Inject 1 mg into the muscle once as needed. For low blood sugar 1 each 12  . glucose blood (IGLUCOSE TEST STRIPS) test strip Check fasting blood sugar 3-4 times daily. Dx: E16.2 100 each 11  . Lancets 30G MISC Check fasting blood sugar daily 50 each PRN  . magnesium oxide (MAG-OX) 400 MG tablet Take 2 tablets (800 mg total) by mouth at bedtime. 90 tablet 3   No facility-administered medications prior to visit.     PAST MEDICAL HISTORY: Past Medical History:  Diagnosis Date  . Anemia   . Anemia, iron deficiency 05/02/2015  . Fibromyalgia   . History of gastric bypass 05/09/2016  . Hypertension   . Malabsorption of iron 05/09/2016  . PONV (postoperative nausea and vomiting)   . Surgical menopause 03/24/2016    PAST SURGICAL HISTORY: Past Surgical History:  Procedure Laterality Date  . ABDOMINAL HYSTERECTOMY  1997  . APPENDECTOMY    .  CESAREAN SECTION  [id number]  . CHOLECYSTECTOMY    . GASTRIC BYPASS  2011  . KNEE ARTHROSCOPY  2002  . TONSILLECTOMY  1977    FAMILY HISTORY: Family History  Problem Relation Age of Onset  . Diabetes Father   . Hypertension Father   . Stroke Father   . Heart failure Father   . Breast cancer Mother     SOCIAL HISTORY:  Social History   Socioeconomic History  . Marital status: Married    Spouse name: Not on file  . Number of children: 2  . Years of education: Assoc  . Highest education level: Not on file  Social Needs  . Financial resource strain: Not on file  . Food insecurity - worry: Not on file  . Food insecurity - inability: Not on file  . Transportation needs - medical: Not on file  . Transportation needs - non-medical: Not on file  Occupational History  . Occupation: [employer]  Tobacco Use  . Smoking status: Never Smoker  . Smokeless tobacco: Never Used  Substance and Sexual Activity  . Alcohol use: No  . Drug use: No  . Sexual activity: No  Other Topics Concern  . Not on file  Social History Narrative   Lives at home w/ her husband, daughter and grandson   Right-handed   Caffeine: 16 oz soda daily     PHYSICAL EXAM  GENERAL EXAM/CONSTITUTIONAL: Vitals:  Vitals:   04/20/17 0807  BP: 120/81  Pulse: 86  Weight: 199 lb 6.4 oz (90.4 kg)  Height: _0  (1.626 m)   Body mass index is 34.23 kg/m. No exam data present  Patient is in no distress; well developed, nourished and groomed; neck is supple  CARDIOVASCULAR:  Examination of carotid arteries is normal; no carotid bruits  Regular rate and rhythm, no murmurs  Examination of peripheral vascular system by observation and palpation is normal  EYES:  Ophthalmoscopic exam of optic discs and posterior segments is normal; no papilledema or hemorrhages  MUSCULOSKELETAL:  Gait, strength, tone, movements noted in Neurologic exam below  NEUROLOGIC: MENTAL STATUS:  No flowsheet data  found.  awake, alert, oriented to person, place and time  recent and remote memory intact  normal attention and concentration  language fluent, comprehension intact, naming intact,   fund of knowledge appropriate  CRANIAL NERVE:   2nd - no papilledema on fundoscopic exam  2nd, 3rd, 4th, 6th - pupils equal and reactive to light, visual fields full to confrontation, extraocular muscles intact, no nystagmus  5th - facial sensation symmetric  7th - facial strength symmetric  8th - hearing intact  9th - palate elevates symmetrically, uvula midline  11th - shoulder shrug symmetric  12th - tongue protrusion midline  MOTOR:   normal bulk and tone, full strength in the BUE, BLE  SENSORY:   normal and symmetric to light touch, temperature, vibration  COORDINATION:   finger-nose-finger, fine finger movements normal  REFLEXES:   deep tendon reflexes present and symmetric  GAIT/STATION:   narrow based gait; romberg is negative  DIAGNOSTIC DATA (LABS, IMAGING, TESTING) - I reviewed patient records, labs, notes, testing and imaging myself where available.  Lab Results  Component Value Date   WBC 6.4 12/30/2016   HGB 14.4 12/30/2016   HCT 42.4 12/30/2016   MCV 92.4 12/30/2016   PLT 317 12/30/2016      Component Value Date/Time   NA 142 03/17/2017 1522   NA 138 05/09/2016 1332   K 3.7 03/17/2017 1522   K 3.6 05/09/2016 1332   CL 110 03/17/2017 1522   CO2 24 03/17/2017 1522   CO2 20 (L) 05/09/2016 1332   GLUCOSE 59 (L) 03/17/2017 1522   GLUCOSE 126 05/09/2016 1332   BUN 9 03/17/2017 1522   BUN 12.2 05/09/2016 1332   CREATININE 0.89 03/17/2017 1522   CREATININE 0.9 05/09/2016 1332   CALCIUM 9.4 03/17/2017 1522   CALCIUM 9.1 05/09/2016 1332   PROT 6.5 03/17/2017 1522   PROT 7.2 05/09/2016 1332   ALBUMIN 4.1 12/30/2016 1506   ALBUMIN 3.7 05/09/2016 1332   AST 31 03/17/2017 1522   AST 34 05/09/2016 1332   ALT 39 (H) 03/17/2017 1522   ALT 28  05/09/2016 1332   ALKPHOS 109 12/30/2016 1506   ALKPHOS 121 05/09/2016 1332   BILITOT 0.5 03/17/2017 1522   BILITOT 0.47 05/09/2016 1332   GFRNONAA 76 03/17/2017 1522   GFRAA 88 03/17/2017 1522   Lab Results  Component Value Date   CHOL 162 03/24/2016   HDL 38 (L) 03/24/2016   LDLCALC 107 03/24/2016   TRIG 85 03/24/2016   CHOLHDL 4.3 03/24/2016   Lab Results  Component Value Date   HGBA1C 5.1 03/17/2017   Lab Results  Component Value Date   VITAMINB12 596 03/17/2017   Lab Results  Component Value Date   TSH 2.31 03/24/2016   01/08/17 EMG/NCS 1. Bilateral (left greater than right) lateral femoral cutaneous neuropathies (i.e. meralgia paresthetica). 2. No definite evidence of underlying large fiber neuropathy or lumbar radiculopathy at this time.  01/26/17 MRI brain 1. No acute intracranial abnormality or etiology of seizures identified. 2. Mild cerebral white matter T2 signal changes, nonspecific. Considerations include early chronic small vessel ischemia, sequelae of trauma, hypercoagulable state, vasculitis, migraines, prior infection, and demyelination.  01/26/17 EEG - normal    ASSESSMENT AND PLAN  51 y.o. year old female here with new onset seizure (01/11/17), with blood glucose of 66, possibly related to hypoglycemia but not clear. Will proceed with further workup with MRI of the brain, EEG. Advised patient to follow driving restriction of at least 6 months seizure-free as per [facility].   Dx:  1. Seizure ([place])   2. Hypoglycemia      PLAN:  I spent 15 minutes of face to face time with patient. Greater than 50% of time was spent in counseling and coordination of care with patient. In summary we discussed:   - monitor symptoms and sugar levels  - According to [place] law, you can not drive unless you are seizure / syncope free for at least 6 months and under physician's care (event occurred on 01/11/17)  - Please maintain precautions. Do not participate in  activities where a loss of awareness could harm you or someone else. No swimming alone, no tub bathing, no hot tubs, no driving, no operating motorized vehicles (cars, ATVs, motocycles, etc), lawnmowers, power tools or firearms. No standing at heights, such as rooftops, ladders or stairs. Avoid hot objects such as stoves, heaters, open fires. Wear a helmet when  riding a bicycle, scooter, skateboard, etc. and avoid areas of traffic. Set your water heater to 120 degrees or less.  Return if symptoms worsen or fail to improve, for return to PCP.    Penni Bombard, MD 25/42/7062, 3:76 AM Certified in Neurology, Neurophysiology and Neuroimaging  [facility] [address] [place], [place] [address] [phone]

## 2017-05-05 ENCOUNTER — Other Ambulatory Visit: Payer: Self-pay | Admitting: Family Medicine

## 2017-05-05 DIAGNOSIS — Z1231 Encounter for screening mammogram for malignant neoplasm of breast: Secondary | ICD-10-CM

## 2017-05-11 ENCOUNTER — Other Ambulatory Visit: Payer: Self-pay | Admitting: Sports Medicine

## 2017-05-11 DIAGNOSIS — F411 Generalized anxiety disorder: Secondary | ICD-10-CM

## 2017-05-11 MED FILL — ESCITALOPRAM 10 MG TABLET: 10 | 90 days supply | Qty: 90 | Fill #0

## 2017-05-21 ENCOUNTER — Encounter: Payer: Self-pay | Admitting: Family Medicine

## 2017-05-21 ENCOUNTER — Ambulatory Visit (INDEPENDENT_AMBULATORY_CARE_PROVIDER_SITE_OTHER): Payer: 59 | Admitting: Family Medicine

## 2017-05-21 ENCOUNTER — Ambulatory Visit (INDEPENDENT_AMBULATORY_CARE_PROVIDER_SITE_OTHER): Payer: 59

## 2017-05-21 VITALS — BP 109/76 | HR 77 | Ht 64.0 in | Wt 204.0 lb

## 2017-05-21 DIAGNOSIS — M7061 Trochanteric bursitis, right hip: Secondary | ICD-10-CM

## 2017-05-21 DIAGNOSIS — E162 Hypoglycemia, unspecified: Secondary | ICD-10-CM | POA: Diagnosis not present

## 2017-05-21 DIAGNOSIS — Z1231 Encounter for screening mammogram for malignant neoplasm of breast: Secondary | ICD-10-CM | POA: Diagnosis not present

## 2017-05-21 DIAGNOSIS — M545 Low back pain, unspecified: Secondary | ICD-10-CM

## 2017-05-21 DIAGNOSIS — M5416 Radiculopathy, lumbar region: Secondary | ICD-10-CM | POA: Diagnosis not present

## 2017-05-21 DIAGNOSIS — M25551 Pain in right hip: Secondary | ICD-10-CM | POA: Diagnosis not present

## 2017-05-21 MED ORDER — AMBULATORY NON FORMULARY MEDICATION: 0 refills | Status: DC

## 2017-05-21 NOTE — Patient Instructions (Addendum)
Thank you for coming in today. Get xray today.  Let me know about the CGM. I will order PT.  Recheck in about 1 months if not better.

## 2017-05-21 NOTE — Progress Notes (Signed)
Stacy Woods is a 51 y.o. female who presents to [facility] today for back pain and hypoglycemia.  Stacy Woods notes continued right low back pain radiating to the right hip.  She had a seizure and fell in August.  She has had pain since then and has not improved much.  She notes continued right low back pain going to the right lateral hip.  The pain is worse with activity and better with rest.  She denies any radiating pain to her right leg weakness or numbness.  No bowel or bladder dysfunction.  Hypoglycemia: Stacy Woods notes continued episodes of hypoglycemia.  The seizure was thought to be caused by hypoglycemic episode.  She notes blood sugars going as low as 40 or 30 at times.  She was unable to tolerate the acarbose due to nausea and diarrhea.  She is interested in a continuous glucose monitor for safety.   Past Medical History:  Diagnosis Date  . Anemia   . Anemia, iron deficiency 05/02/2015  . Fibromyalgia   . History of gastric bypass 05/09/2016  . Hypertension   . Malabsorption of iron 05/09/2016  . PONV (postoperative nausea and vomiting)   . Surgical menopause 03/24/2016   Past Surgical History:  Procedure Laterality Date  . ABDOMINAL HYSTERECTOMY  1997  . APPENDECTOMY    . CESAREAN SECTION  [id number]  . CHOLECYSTECTOMY    . GASTRIC BYPASS  2011  . KNEE ARTHROSCOPY  2002  . TONSILLECTOMY  1977  . ULNAR NERVE TRANSPOSITION Right 06/12/2015   Procedure: RIGHT CUBITAL TUNNEL RELEASE;  Surgeon: Milly Jakob, MD;  Location: [facility];  Service: Orthopedics;  Laterality: Right;   Social History   Tobacco Use  . Smoking status: Never Smoker  . Smokeless tobacco: Never Used  Substance Use Topics  . Alcohol use: No   family history includes Breast cancer in her mother; Diabetes in her father; Heart failure in her father; Hypertension in her father;  Stroke in her father.  ROS as above:  Medications: Current Outpatient Medications  Medication Sig Dispense Refill  . AMBULATORY NON FORMULARY MEDICATION One Touch Glucometer per insurance formulary. One month of testing strips, and lancets used to check blood sugar daily. 1 Units prn  . blood glucose meter kit and supplies KIT Check fasting blood sugar daily. 1 each 0  . escitalopram (LEXAPRO) 10 MG tablet TAKE 1 TABLET (10 MG TOTAL) BY MOUTH DAILY. 90 tablet 3  . glucagon 1 MG injection Inject 1 mg into the muscle once as needed. For low blood sugar 1 each 12  . glucose blood (IGLUCOSE TEST STRIPS) test strip Check fasting blood sugar 3-4 times daily. Dx: E16.2 100 each 11  . Lancets 30G MISC Check fasting blood sugar daily 50 each PRN  . magnesium oxide (MAG-OX) 400 MG tablet Take 2 tablets (800 mg total) by mouth at bedtime. 90 tablet 3  . AMBULATORY NON FORMULARY MEDICATION Continuous glucose monitor for profound hypoglycemia.  Dexcom system if able. E16.2 1 each 0   No current facility-administered medications for this visit.    Allergies  Allergen Reactions  . Mobic [Meloxicam] Swelling  . Shellfish Allergy Nausea Only and Swelling  . Lyrica [Pregabalin] Other (See Comments)    "zombie"  . Percocet [Oxycodone-Acetaminophen] Itching    Health Maintenance Health Maintenance  Topic Date Due  . HIV Screening  02/26/1983  . TETANUS/TDAP  03/24/2026  . INFLUENZA VACCINE  Completed     Exam:  BP 109/76   Pulse 77   Ht _0  (1.626 m)   Wt 204 lb (92.5 kg)   BMI 35.02 kg/m  Gen: Well NAD HEENT: EOMI,  MMM Lungs: Normal work of breathing. CTABL Heart: RRR no MRG Abd: NABS, Soft. Nondistended, Nontender Exts: Brisk capillary refill, warm and well perfused.  MSK: L spine nontender to midline.  Tender palpation right lower lumbar paraspinal muscle. Motion is decreased especially with extension and rotation. Lower extremity strength is intact. Normal gait. Incision is  intact throughout lower extremities.  Right hip: Normal-appearing Normal hip motion. Tender to palpation greater trochanter. Hip abduction strength is diminished 4+/5   No results found for this or any previous visit (from the past 72 hour(s)). Dg Lumbar Spine Complete  Result Date: 05/21/2017 CLINICAL DATA:  Right low back pain radiating into right hip. EXAM: LUMBAR SPINE - COMPLETE 4+ VIEW COMPARISON:  MRI 07/05/2016 FINDINGS: There is no evidence of lumbar spine fracture. Alignment is normal. Intervertebral disc spaces are maintained. IMPRESSION: Negative. Electronically Signed   By: Rolm Baptise M.D.   On: 05/21/2017 09:52   Mm Screening Breast Tomo Bilateral  Result Date: 05/21/2017 CLINICAL DATA:  Screening. EXAM: 2D DIGITAL SCREENING BILATERAL MAMMOGRAM WITH CAD AND ADJUNCT TOMO COMPARISON:  Previous exam(s). ACR Breast Density Category b: There are scattered areas of fibroglandular density. FINDINGS: There are no findings suspicious for malignancy. Images were processed with CAD. IMPRESSION: No mammographic evidence of malignancy. A result letter of this screening mammogram will be mailed directly to the patient. RECOMMENDATION: Screening mammogram in one year. (Code:SM-B-01Y) BI-RADS CATEGORY  1: Negative. Electronically Signed   By: Lajean Manes M.D.   On: 05/21/2017 08:29   Dg Hip Unilat With Pelvis 1v Right  Result Date: 05/21/2017 CLINICAL DATA:  Right low back pain radiating into right hip. EXAM: DG HIP (WITH OR WITHOUT PELVIS) 1V RIGHT COMPARISON:  None. FINDINGS: There is no evidence of hip fracture or dislocation. There is no evidence of arthropathy or other focal bone abnormality. Hip joints and SI joints are symmetric and unremarkable. IMPRESSION: Negative. Electronically Signed   By: Rolm Baptise M.D.   On: 05/21/2017 09:53      Assessment and Plan: 51 y.o. female with  Lumbago and trochanteric bursitis after a fall.  X-rays unremarkable.  Likely myofascial  disruption.  Plan for refer to physical therapy.  If not better next step would be MRI for injection planning.  Hypoglycemia: Difficult to control and dangerous.  I think a continuous glucose monitor is an excellent idea.  I have written an order for this which we will try to arrange through [employer] as Stacy Woods is a [employer].    Orders Placed This Encounter  Procedures  . DG Lumbar Spine Complete    Please include AP, Lateral, obliques, and lumbosacral spot views.    Standing Status:   Future    Number of Occurrences:   1    Standing Expiration Date:   07/21/2018    Order Specific Question:   Preferred imaging location?    Answer:   [facility]    Comments:   Please include AP, Lateral, obliques, and lumbosacral spot views.    Order Specific Question:   Reason for exam:    Answer:   Please include AP, Lateral, obliques, and lumbosacral spot views.    Comments:   Please include AP, Lateral, obliques, and lumbosacral spot views.  . DG HIP UNILAT  WITH PELVIS 1V RIGHT    Standing Status:   Future    Number of Occurrences:   1    Standing Expiration Date:   07/22/2018    Order Specific Question:   Reason for Exam (SYMPTOM  OR DIAGNOSIS REQUIRED)    Answer:   right lateral hip pain    Order Specific Question:   Is patient pregnant?    Answer:   No    Order Specific Question:   Preferred imaging location?    Answer:   [facility]    Comments:   Please include AP, Lateral, obliques, and lumbosacral spot views.    Order Specific Question:   Radiology Contrast Protocol - do NOT remove file path    Answer:   [email or web address]  . Ambulatory referral to Physical Therapy    Referral Priority:   Routine    Referral Type:   Physical Medicine    Referral Reason:   Specialty Services Required    Requested Specialty:   Physical Therapy   Meds ordered this encounter  Medications  . AMBULATORY NON FORMULARY MEDICATION    Sig:  Continuous glucose monitor for profound hypoglycemia.  Dexcom system if able. E16.2    Dispense:  1 each    Refill:  0     Discussed warning signs or symptoms. Please see discharge instructions. Patient expresses understanding.

## 2017-05-29 ENCOUNTER — Ambulatory Visit: Payer: 59 | Admitting: Physical Therapy

## 2017-06-04 ENCOUNTER — Encounter: Payer: Self-pay | Admitting: Physical Therapy

## 2017-06-04 ENCOUNTER — Ambulatory Visit: Payer: 59 | Attending: Family Medicine | Admitting: Physical Therapy

## 2017-06-04 DIAGNOSIS — M62830 Muscle spasm of back: Secondary | ICD-10-CM | POA: Insufficient documentation

## 2017-06-04 DIAGNOSIS — M5440 Lumbago with sciatica, unspecified side: Secondary | ICD-10-CM | POA: Insufficient documentation

## 2017-06-04 NOTE — Therapy (Addendum)
[facility] [address], [place], [address] Phone: [phone]   Fax:  [phone]  Physical Therapy Evaluation  Patient Details  Name: Stacy Woods MRN: [id number] Date of Birth: 03/05/1966 Referring Provider: Lynne Leader   Encounter Date: 06/04/2017  PT End of Session - 06/04/17 1134    Visit Number  1    Date for PT Re-Evaluation  08/05/17    PT Start Time  1100    PT Stop Time  1150    PT Time Calculation (min)  50 min    Activity Tolerance  Patient tolerated treatment well    Behavior During Therapy  [facility] for tasks assessed/performed       Past Medical History:  Diagnosis Date  . Anemia   . Anemia, iron deficiency 05/02/2015  . Fibromyalgia   . History of gastric bypass 05/09/2016  . Hypertension   . Malabsorption of iron 05/09/2016  . PONV (postoperative nausea and vomiting)   . Surgical menopause 03/24/2016    Past Surgical History:  Procedure Laterality Date  . ABDOMINAL HYSTERECTOMY  1997  . APPENDECTOMY    . CESAREAN SECTION  [id number]  . CHOLECYSTECTOMY    . GASTRIC BYPASS  2011  . KNEE ARTHROSCOPY  2002  . TONSILLECTOMY  1977  . ULNAR NERVE TRANSPOSITION Right 06/12/2015   Procedure: RIGHT CUBITAL TUNNEL RELEASE;  Surgeon: Milly Jakob, MD;  Location: [facility];  Service: Orthopedics;  Laterality: Right;    There were no vitals filed for this visit.   Subjective Assessment - 06/04/17 1101    Subjective  X-rays of the back and the hip were negative.  She has been having left low back pain since she had a seizure in August.  She feels like she has spasms in the low back and at time in the abdomen.  She does have a history of fibromyalgia.    Limitations  Standing    Patient Stated Goals  have less pain, have less difficulty getting up and down    Currently in Pain?  Yes    Pain Score  4     Pain Location  Back    Pain Orientation  Left;Lower    Pain Descriptors /  Indicators  Aching;Cramping;Spasm    Pain Radiating Towards  as some p and anterior thighain in the left buttock and the left lateral leg    Pain Onset  More than a month ago    Pain Frequency  Constant    Aggravating Factors   Standing, getting up and down, pain up to 9/10, lifting grandson    Pain Relieving Factors  heat, rest, pain can be as little as a 3-4/10    Effect of Pain on Daily Activities  difficulty getting up and down, lifting grandchild, turning over in bed         [facility] PT Assessment - 06/04/17 0001      Assessment   Medical Diagnosis  LBP    Referring Provider  Lynne Leader    Onset Date/Surgical Date  05/05/17    Prior Therapy  yes, with some benefit      Precautions   Precautions  None      Balance Screen   Has the patient fallen in the past 6 months  No    Has the patient had a decrease in activity level because of a fear of falling?   No    Is the patient  reluctant to leave their home because of a fear of falling?   No      Home Environment   Additional Comments  3 stairs into the home      Prior Function   Level of Independence  Independent    Vocation  Full time employment    Vocation Requirements  mostly sitting    Leisure  some walking      ROM / Strength   AROM / PROM / Strength  AROM;Strength      AROM   Overall AROM Comments  Lumbar ROM was decreased 50% with some pain in the back and hips, hip ROM on the left was to 80 degrees flexion with some pain in the left low back      Strength   Overall Strength Comments  4-/5 for the hips with pain in the back and the hips      Flexibility   Soft Tissue Assessment /Muscle Length  yes    Hamstrings  very tight    Quadriceps  tight and some replication of symptoms    ITB  very tight    Piriformis  tight with some back pain      Palpation   Palpation comment  very tender in the left lumbar area, this side the lumbar parapsinals are a little more prominent, tender in the hips and the ITB, the right  iliopsoas is very tender and seemd to refer some pain i th eback      Transfers   Comments  gaurded with motions and seems to be painful             Objective measurements completed on examination: See above findings.      [facility] Adult PT Treatment/Exercise - 06/04/17 0001      Moist Heat Therapy   Number Minutes Moist Heat  15 Minutes    Moist Heat Location  Lumbar Spine      Electrical Stimulation   Electrical Stimulation Location  right iliopsoas anterior and posterior    Electrical Stimulation Action  IFC    Electrical Stimulation Parameters  supine     Electrical Stimulation Goals  Pain             PT Education - 06/04/17 1134    Education provided  Yes    Education Details  gave handout for hip flexor stretches and for sheet traction at home    Person(s) Educated  Patient    Methods  Explanation;Demonstration;Handout    Comprehension  Verbalized understanding       PT Short Term Goals - 06/04/17 1137      PT SHORT TERM GOAL #1   Title  independent with initial HEP    Time  2    Period  Weeks    Status  New        PT Long Term Goals - 06/04/17 1137      PT LONG TERM GOAL #1   Title  understand posture at work and how to manage back issues and posture    Time  8    Period  Weeks    Status  New      PT LONG TERM GOAL #2   Title  increased  ROM to WFL's  painfree     Time  8    Period  Weeks    Status  New      PT LONG TERM GOAL #3   Title  Patient to tolerate sitting; standing;  walking for 15 min without pain     Time  8    Period  Weeks    Status  New      PT LONG TERM GOAL #4   Title  able to lift grandchild without problems    Time  8    Period  Weeks    Status  New             Plan - 06/04/17 1134    Clinical Impression Statement  Patient reports a seizure and a fall in August, she reports that she has been having some back pain since that time, she has a diagnosis of fibromyalgia that has limited her motions and her  pain.  She had lumbar ROM decreased 50%, she is very tender in the right iliopsoas, the lumbar paraspinals, the buttocks and the ITB.      Clinical Presentation  Stable    Clinical Decision Making  Low    Rehab Potential  Good    PT Frequency  2x / week    PT Duration  8 weeks    PT Treatment/Interventions  ADLs/Self Care Home Management;Cryotherapy;Electrical Stimulation;Iontophoresis 7m/ml Dexamethasone;Moist Heat;Ultrasound;Gait training;Therapeutic activities;Therapeutic exercise;Patient/family education;Manual techniques;Taping;Traction    PT Next Visit Plan  will try to start some STM of the low back and the iliopsoas    Consulted and Agree with Plan of Care  Patient       Patient will benefit from skilled therapeutic intervention in order to improve the following deficits and impairments:  Abnormal gait, Decreased range of motion, Decreased strength, Difficulty walking, Increased fascial restricitons, Increased muscle spasms, Impaired flexibility, Postural dysfunction, Improper body mechanics, Pain  Visit Diagnosis: Acute bilateral low back pain with sciatica, sciatica laterality unspecified - Plan: PT plan of care cert/re-cert  Muscle spasm of back - Plan: PT plan of care cert/re-cert     Problem List Patient Active Problem List   Diagnosis Date Noted  . Low magnesium level 01/12/2017  . B12 deficiency 01/01/2017  . Trochanteric bursitis of left hip 10/08/2016  . Left thigh pain 08/18/2016  . Syncope 06/12/2016  . Malabsorption of iron 05/09/2016  . History of gastric bypass 05/09/2016  . Arthritis of knee, degenerative 04/14/2016  . Surgical menopause 03/24/2016  . Family history of breast cancer 03/24/2016  . Left knee pain 10/01/2015  . Hypoglycemia 06/12/2015  . Iron deficiency anemia 05/02/2015  . Cubital tunnel syndrome on right 09/14/2014  . Degenerative disc disease, cervical 07/27/2014  . Prediabetes 09/01/2013  . Fibromyalgia 04/11/2013  . Essential  hypertension, benign 04/11/2013  . Anxiety state 04/11/2013    ASumner Boast, PT 06/04/2017, 11:40 AM  [address] [address]Phone: [phone]  Fax:  [phone] Name: Stacy KOLEKMRN: [id number]  Date of Birth: 03/27/1966-08-04  PHYSICAL THERAPY DISCHARGE SUMMARY  Visits from Start of Care: 1    Plan: Patient agrees to discharge.  Patient goals were not met. Patient is being discharged due to not returning since the last visit.  ?????     LLyndee Hensen PT, DPT 9:46 PM  12/07/18

## 2017-06-22 MED FILL — LISINOPRIL-HCTZ 20-12.5 MG: 20-12.5 | 90 days supply | Qty: 90 | Fill #1

## 2017-07-14 ENCOUNTER — Encounter: Payer: Self-pay | Admitting: Family Medicine

## 2017-07-14 ENCOUNTER — Ambulatory Visit (INDEPENDENT_AMBULATORY_CARE_PROVIDER_SITE_OTHER): Payer: No Typology Code available for payment source | Admitting: Family Medicine

## 2017-07-14 VITALS — BP 98/66 | HR 83 | Wt 184.0 lb

## 2017-07-14 DIAGNOSIS — L570 Actinic keratosis: Secondary | ICD-10-CM | POA: Insufficient documentation

## 2017-07-14 DIAGNOSIS — Z0000 Encounter for general adult medical examination without abnormal findings: Secondary | ICD-10-CM

## 2017-07-14 HISTORY — DX: Actinic keratosis: L57.0

## 2017-07-14 NOTE — Patient Instructions (Addendum)
Thank you for coming in today. We should recheck iron and B12 in 6 months.  Continue low carb diet. This will help both hypoglycemia and weight.  Colon cancer screening start at 17.  Recheck in 6 months.  Use sun screen.  Return for liquid nitrogen treatment or dermatology.    Actinic Keratosis An actinic keratosis is a precancerous growth on the skin. This means that it could develop into skin cancer if it is not treated. About 1% of these growths (actinic keratoses) turn into skin cancer within one year if they are not treated. It is important to have all of these growths evaluated to determine the best treatment approach. What are the causes? This condition is caused by getting too much ultraviolet (UV) radiation from the sun or other UV light sources. What increases the risk? The following factors may make you more likely to develop this condition:  Having light-colored skin and blue eyes.  Having blonde or red hair.  Spending a lot of time in the sun.  Inadequate skin protection when outdoors. This may include: ? Not using sunscreen properly. ? Not covering up skin that is exposed to sunlight.  Aging. The risk of developing an actinic keratosis increases with age.  What are the signs or symptoms? Actinic keratoses look like scaly, rough spots of skin.They can be as small as a pinhead or as big as a quarter. They may itch, hurt, or feel sensitive. In most cases, the growths become red. In some cases, they may be skin-colored, light tan, dark tan, pink, or a combination of any of these colors. There may be a small piece of pink or gray skin (skin tag) growing from the actinic keratosis. In some cases, it may be easier to notice actinic keratoses by feeling them, rather than seeing them. Actinic keratoses appear most often on areas of skin that get a lot of sun exposure, including the scalp, face, ears, lips, upper back, forearms, and the backs of the hands. Sometimes, actinic  keratoses disappear, but many reappear a few days to a few weeks later. How is this diagnosed? This condition is usually diagnosed with a physical exam. A tissue sample may be removed from the actinic keratosis and examined under a microscope (biopsy). How is this treated?  Treatment for this condition may include:  Scraping off the actinic keratosis (curettage).  Freezing the actinic keratosis with liquid nitrogen (cryosurgery). This causes the growth to eventually fall off the skin.  Applying medicated creams or gels to destroy the cells in the growth.  Applying chemicals to the actinic keratosis to make the outer layers of skin peel off (chemical peel).  Photodynamic therapy. In this procedure, medicated cream is applied to the actinic keratosis. This cream increases your skin's sensitivity to light. Then, a strong light is aimed at the actinic keratosis to destroy cells in the growth.  Follow these instructions at home: Skin care  Apply cool, wet cloths (cool compresses) to the affected areas.  Do not scratch your skin.  Check your skin regularly for any growths, especially growths that: ? Start to itch or bleed. ? Change in size, shape, or color. Caring for the treated area  Keep the treated area clean and dry as told by your health care provider.  Do not apply any medicine, cream, or lotion to the treated area unless your health care provider tells you to do that.  Do not pick at blisters or try to break them open. This can cause  infection and scarring.  If you have red or irritated skin after treatment, follow instructions from your health care provider about how to take care of the treated area. Make sure you: ? Wash your hands with soap and water before you change your bandage (dressing). If soap and water are not available, use hand sanitizer. ? Change your dressing as told by your health care provider.  If you have red or irritated skin after treatment, check your  treated area every day for signs of infection. Check for: ? Swelling, pain, or more redness. ? Fluid or blood. ? Warmth. ? Pus or a bad smell. General instructions  Take over-the-counter and prescription medicines only as told by your health care provider.  Return to your normal activities as told by your health care provider. Ask your health care provider what activities are safe for you.  Do not use any tobacco products, such as cigarettes, chewing tobacco, and e-cigarettes. If you need help quitting, ask your health care provider.  Have a skin exam done every year by a health care provider who is a skin conditions specialist (dermatologist).  Keep all follow-up visits as told by your health care provider. This is important. How is this prevented?  Do not get sunburns.  Try to avoid the sun between 10:00 a.m. and 4:00 p.m. This is when the UV light is the strongest.  Use a sunscreen or sunblock with SPF 30 (sun protection factor 30) or greater.  Apply sunscreen before you are exposed to sunlight, and reapply periodically as often as directed by the instructions on the sunscreen container.  Always wear sunglasses that have UV protection, and always wear hats and clothing to protect your skin from sunlight.  When possible, avoid medicines that increase your sensitivity to sunlight. These include: ? Certain antibiotic medicines. ? Certain water pills (diuretics). ? Certain prescription medicines that are used to treat acne (retinoids).  Do not use tanning beds or other indoor tanning devices. Contact a health care provider if:  You notice any changes or new growths on your skin.  You have swelling, pain, or more redness around your treated area.  You have fluid or blood coming from your treated area.  Your treated area feels warm to the touch.  You have pus or a bad smell coming from your treated area.  You have a fever.  You have a blister that becomes large and  painful. This information is not intended to replace advice given to you by your health care provider. Make sure you discuss any questions you have with your health care provider. Document Released: 08/22/2008 Document Revised: 01/25/2016 Document Reviewed: 02/03/2015 Elsevier Interactive Patient Education  [employer].

## 2017-07-14 NOTE — Progress Notes (Signed)
Stacy Woods is a 52 y.o. female who presents to [place]: [place] today for well adult visit.   Stacy Woods is doing well overall as listed below. She feels much better.   Back pain: Patient with complete resolution of back pain symptoms since last visit. Patient did attend physical therapy for one session. It was very helpful and she has been doing home exercises since that time. She has also increased her baseline physical activity which has helped significantly with her back pain.   Hypoglycemia: Patient has only had two episodes of hypoglycemia in the last several months. She has initiated intensive low carbohydrate diet and this has nearly eliminated her episodes of hypoglycemia. Has also lost 14 pounds since the new year.  Skin lesion: patient complaining of hypopigmented raised skin lesion on right chest. Not painful or itchy, but wanted to get it checked out. Most consistent with AK.  Health maintenance: patient's recent mammogram was normal. She should start colon cancer screening at 59.    Past Medical History:  Diagnosis Date  . AK (actinic keratosis) 07/14/2017   Right upper chest wall  . Anemia   . Anemia, iron deficiency 05/02/2015  . Fibromyalgia   . History of gastric bypass 05/09/2016  . Hypertension   . Malabsorption of iron 05/09/2016  . PONV (postoperative nausea and vomiting)   . Surgical menopause 03/24/2016   Past Surgical History:  Procedure Laterality Date  . ABDOMINAL HYSTERECTOMY  1997  . APPENDECTOMY    . CESAREAN SECTION  [id number]  . CHOLECYSTECTOMY    . GASTRIC BYPASS  2011  . KNEE ARTHROSCOPY  2002  . TONSILLECTOMY  1977  . ULNAR NERVE TRANSPOSITION Right 06/12/2015   Procedure: RIGHT CUBITAL TUNNEL RELEASE;  Surgeon: Milly Jakob, MD;  Location: [facility];  Service: Orthopedics;  Laterality: Right;   Social History    Tobacco Use  . Smoking status: Never Smoker  . Smokeless tobacco: Never Used  Substance Use Topics  . Alcohol use: No   family history includes Breast cancer in her mother; Diabetes in her father; Heart failure in her father; Hypertension in her father; Stroke in her father.  ROS as above:  Medications: Current Outpatient Medications  Medication Sig Dispense Refill  . AMBULATORY NON FORMULARY MEDICATION One Touch Glucometer per insurance formulary. One month of testing strips, and lancets used to check blood sugar daily. 1 Units prn  . AMBULATORY NON FORMULARY MEDICATION Continuous glucose monitor for profound hypoglycemia.  Dexcom system if able. E16.2 1 each 0  . blood glucose meter kit and supplies KIT Check fasting blood sugar daily. 1 each 0  . escitalopram (LEXAPRO) 10 MG tablet TAKE 1 TABLET (10 MG TOTAL) BY MOUTH DAILY. 90 tablet 3  . glucagon 1 MG injection Inject 1 mg into the muscle once as needed. For low blood sugar 1 each 12  . glucose blood (IGLUCOSE TEST STRIPS) test strip Check fasting blood sugar 3-4 times daily. Dx: E16.2 100 each 11  . Lancets 30G MISC Check fasting blood sugar daily 50 each PRN  . magnesium oxide (MAG-OX) 400 MG tablet Take 2 tablets (800 mg total) by mouth at bedtime. 90 tablet 3   No current facility-administered medications for this visit.    Allergies  Allergen Reactions  . Mobic [Meloxicam] Swelling  . Shellfish Allergy Nausea Only and Swelling  . Lyrica [Pregabalin] Other (See Comments)    "zombie"  .  Percocet [Oxycodone-Acetaminophen] Itching    Health Maintenance Health Maintenance  Topic Date Due  . HIV Screening  07/14/2018 (Originally 02/26/1981)  . TETANUS/TDAP  03/24/2026  . INFLUENZA VACCINE  Completed     Exam:  BP 98/66   Pulse 83   Wt 184 lb (83.5 kg)   BMI 31.58 kg/m  Gen: Well NAD HEENT: EOMI,  MMM Lungs: Normal work of breathing. CTABL Heart: RRR no MRG Abd: NABS, Soft. Nondistended, Nontender Exts:  Brisk capillary refill, warm and well perfused.  Skin: singular raised hypopigmented lesion of right anterior chest wall.   No results found for this or any previous visit (from the past 72 hour(s)). No results found.   Assessment and Plan: 52 y.o. female here for well adult visit.   Doing well. Screening addressed.   Lumbosacral strain: resolved. Patient should continue home exercises and lifestyle changes.   Hypoglycemia: significantly improved now that she has initiated low carb diet. She should continue with this diet and continue to monitor blood glucose periodically.   Actinic keratosis: Patient with singular skin lesion of anterior right chest, most consistent with AK. Liquid nitrogen therapy was offered. Patient elected to defer therapy until a later date. Counseled that this is precancerous lesion for squamous cell, but that the overall risk for conversion is relatively small and metastasis is exceedingly small.   Health maintenance: colon cancer screening in 7 months. Will address at next follow up visit.    No orders of the defined types were placed in this encounter.  No orders of the defined types were placed in this encounter.    Discussed warning signs or symptoms. Please see discharge instructions. Patient expresses understanding.

## 2017-07-16 ENCOUNTER — Encounter: Payer: Self-pay | Admitting: Family Medicine

## 2017-07-16 ENCOUNTER — Ambulatory Visit (INDEPENDENT_AMBULATORY_CARE_PROVIDER_SITE_OTHER): Payer: No Typology Code available for payment source | Admitting: Family Medicine

## 2017-07-16 VITALS — BP 90/63 | HR 67 | Wt 188.0 lb

## 2017-07-16 DIAGNOSIS — L570 Actinic keratosis: Secondary | ICD-10-CM

## 2017-07-17 NOTE — Progress Notes (Signed)
Jeweldean presents to clinic today for previously arranged chrysotherapy for actinic keratosis on right chest wall and left nose.  We reviewed risks and benefits of this procedure.  The small actinic keratosis on the right chest wall and left nose was treated with liquid nitrogen jet to achieve a Frost for 5 seconds and repeated 3 times. Patient tolerated the procedure well and will return as needed.

## 2017-08-14 ENCOUNTER — Ambulatory Visit (INDEPENDENT_AMBULATORY_CARE_PROVIDER_SITE_OTHER): Payer: No Typology Code available for payment source | Admitting: Family Medicine

## 2017-08-14 ENCOUNTER — Encounter: Payer: Self-pay | Admitting: Family Medicine

## 2017-08-14 VITALS — BP 111/76 | HR 66 | Ht 64.02 in | Wt 188.0 lb

## 2017-08-14 DIAGNOSIS — F411 Generalized anxiety disorder: Secondary | ICD-10-CM | POA: Diagnosis not present

## 2017-08-14 DIAGNOSIS — G4700 Insomnia, unspecified: Secondary | ICD-10-CM

## 2017-08-14 DIAGNOSIS — R109 Unspecified abdominal pain: Secondary | ICD-10-CM | POA: Diagnosis not present

## 2017-08-14 HISTORY — DX: Insomnia, unspecified: G47.00

## 2017-08-14 LAB — POCT URINALYSIS DIPSTICK
Bilirubin, UA: NEGATIVE
Blood, UA: NEGATIVE
Glucose, UA: NEGATIVE
Ketones, UA: NEGATIVE
LEUKOCYTES UA: NEGATIVE
NITRITE UA: NEGATIVE
Spec Grav, UA: >=1.03 — AB (ref 1.010–1.025)
Urobilinogen, UA: 0.2 E.U./dL
pH, UA: 6 (ref 5.0–8.0)

## 2017-08-14 MED ORDER — ZOLPIDEM TARTRATE 5 MG PO TABS: 5.0000 mg | ORAL_TABLET | Freq: Every evening | ORAL | 1 refills | Status: DC | PRN

## 2017-08-14 MED ORDER — GABAPENTIN 300 MG PO CAPS: 300.0000 mg | ORAL_CAPSULE | Freq: Three times a day (TID) | ORAL | 0 refills | Status: DC

## 2017-08-14 MED ORDER — TRAMADOL HCL 50 MG PO TABS: 50.0000 mg | ORAL_TABLET | Freq: Four times a day (QID) | ORAL | 0 refills | Status: DC | PRN

## 2017-08-14 MED ORDER — VALACYCLOVIR HCL 1 G PO TABS: 1000.0000 mg | ORAL_TABLET | Freq: Three times a day (TID) | ORAL | 0 refills | Status: DC

## 2017-08-14 NOTE — Progress Notes (Signed)
Stacy Woods is a 52 y.o. female who presents to [facility]: [place] today for left flank pain.  [name] notes a 1 day history of left flank pain radiating to the lateral low back approaching the anterior abdomen.  The pain is tingling and unlike previous episodes of muscle strains or spasms.  She denies any urinary frequency urgency or dysuria or blood in the urine.  She denies any significant abdominal pain fevers or chills.  She is concerned that she may be developing shingles.  She denies any current rash.  She is tried some over-the-counter medications for pain but notes they are insufficient to control current pain.  She rates the pain is moderate to severe.   Additionally Stacy Woods notes insomnia.  She has had insomnia in the past and has had trials of trazodone and Klonopin which were not very effective or tolerable.  She notes her mood has worsened as noted below.  She is interested in management of her insomnia symptoms which she finds to be quite obnoxious.  She has trouble falling asleep.  Mood: Stacy Woods has a history of anxiety and depression in the past.  She currently takes Lexapro 10 mg daily.  She notes that she is currently in the process of getting a divorce and has been separated from her husband since January 1.  She finds this distressing obviously and feels sad and upset.  She does have support however from her friends and family.  In the past she is used counseling and is interested in this again through work.   Past Medical History:  Diagnosis Date  . AK (actinic keratosis) 07/14/2017   Right upper chest wall  . Anemia   . Anemia, iron deficiency 05/02/2015  . Fibromyalgia   . History of gastric bypass 05/09/2016  . Hypertension   . Malabsorption of iron 05/09/2016  . PONV (postoperative nausea and vomiting)   . Surgical menopause 03/24/2016   Past Surgical History:    Procedure Laterality Date  . ABDOMINAL HYSTERECTOMY  1997  . APPENDECTOMY    . CESAREAN SECTION  [id number]  . CHOLECYSTECTOMY    . GASTRIC BYPASS  2011  . KNEE ARTHROSCOPY  2002  . TONSILLECTOMY  1977  . ULNAR NERVE TRANSPOSITION Right 06/12/2015   Procedure: RIGHT CUBITAL TUNNEL RELEASE;  Surgeon: Milly Jakob, MD;  Location: [facility];  Service: Orthopedics;  Laterality: Right;   Social History   Tobacco Use  . Smoking status: Never Smoker  . Smokeless tobacco: Never Used  Substance Use Topics  . Alcohol use: No   family history includes Breast cancer in her mother; Diabetes in her father; Heart failure in her father; Hypertension in her father; Stroke in her father.  ROS as above:  Medications: Current Outpatient Medications  Medication Sig Dispense Refill  . AMBULATORY NON FORMULARY MEDICATION One Touch Glucometer per insurance formulary. One month of testing strips, and lancets used to check blood sugar daily. 1 Units prn  . AMBULATORY NON FORMULARY MEDICATION Continuous glucose monitor for profound hypoglycemia.  Dexcom system if able. E16.2 1 each 0  . blood glucose meter kit and supplies KIT Check fasting blood sugar daily. 1 each 0  . escitalopram (LEXAPRO) 10 MG tablet TAKE 1 TABLET (10 MG TOTAL) BY MOUTH DAILY. 90 tablet 3  . gabapentin (NEURONTIN) 300 MG capsule Take 1 capsule (300 mg total) by mouth 3 (three) times daily. 90 capsule 0  .  glucagon 1 MG injection Inject 1 mg into the muscle once as needed. For low blood sugar 1 each 12  . glucose blood (IGLUCOSE TEST STRIPS) test strip Check fasting blood sugar 3-4 times daily. Dx: E16.2 100 each 11  . Lancets 30G MISC Check fasting blood sugar daily 50 each PRN  . magnesium oxide (MAG-OX) 400 MG tablet Take 2 tablets (800 mg total) by mouth at bedtime. 90 tablet 3  . traMADol (ULTRAM) 50 MG tablet Take 1-2 tablets (50-100 mg total) by mouth every 6 (six) hours as needed. 15 tablet 0  . valACYclovir  (VALTREX) 1000 MG tablet Take 1 tablet (1,000 mg total) by mouth 3 (three) times daily. 21 tablet 0  . zolpidem (AMBIEN) 5 MG tablet Take 1 tablet (5 mg total) by mouth at bedtime as needed for sleep. 30 tablet 1   No current facility-administered medications for this visit.    Allergies  Allergen Reactions  . Mobic [Meloxicam] Swelling  . Shellfish Allergy Nausea Only and Swelling  . Lyrica [Pregabalin] Other (See Comments)    "zombie"  . Percocet [Oxycodone-Acetaminophen] Itching    Health Maintenance Health Maintenance  Topic Date Due  . HIV Screening  07/14/2018 (Originally 02/26/1981)  . TETANUS/TDAP  03/24/2026  . INFLUENZA VACCINE  Completed     Exam:  BP 111/76   Pulse 66   Ht 5' 4.02" (1.626 m)   Wt 188 lb (85.3 kg)   BMI 32.25 kg/m  Gen: Well NAD HEENT: EOMI,  MMM Lungs: Normal work of breathing. CTABL Heart: RRR no MRG Abd: NABS, Soft. Nondistended, Nontender no CVA angle tenderness to percussion Exts: Brisk capillary refill, warm and well perfused.  Skin: No rash L-spine normal motion Psych: Alert and oriented normal speech thought process and affect.  No SI or HI expressed.  Depression screen [facility] 2/9 08/14/2017 01/29/2017 03/24/2016  Decreased Interest 1 0 0  Down, Depressed, Hopeless 0 0 0  PHQ - 2 Score 1 0 0  Altered sleeping 3 - -  Tired, decreased energy 3 - -  Change in appetite 1 - -  Feeling bad or failure about yourself  1 - -  Trouble concentrating 0 - -  Moving slowly or fidgety/restless 0 - -  Suicidal thoughts 0 - -  PHQ-9 Score 9 - -  Difficult doing work/chores Not difficult at all - -   GAD 7 : Generalized Anxiety Score 08/14/2017  Nervous, Anxious, on Edge 1  Control/stop worrying 1  Worry too much - different things 1  Trouble relaxing 1  Restless 1  Easily annoyed or irritable 1  Afraid - awful might happen 0  Total GAD 7 Score 6  Anxiety Difficulty Not difficult at all      Results for orders placed or performed in visit  on 08/14/17 (from the past 72 hour(s))  POCT Urinalysis Dipstick     Status: Abnormal   Collection Time: 08/14/17 11:33 AM  Result Value Ref Range   Color, UA yellow    Clarity, UA clear    Glucose, UA negative    Bilirubin, UA negative    Ketones, UA negative    Spec Grav, UA >=1.030 (A) 1.010 - 1.025   Blood, UA negative    pH, UA 6.0 5.0 - 8.0   Protein, UA 72m/dl    Urobilinogen, UA 0.2 0.2 or 1.0 E.U./dL   Nitrite, UA negative    Leukocytes, UA Negative Negative   Appearance     Odor  No results found.    Assessment and Plan: 52 y.o. female with  Flank pain likely shingles based on absence of urinary symptoms and no blood in the urine.  Pyelonephritis or kidney stone is very unlikely.  Plan for empiric treatment with Valtrex now as well as tramadol and gabapentin for pain control.  Recheck as needed.  Insomnia: Worsening chronic issue.  Likely related to stress and worsening mood.  Plan for trial of Ambien low-dose immediate release.  Recheck if not improved.  Mood: Slightly worsening but still in the mild range.  Plan for watchful waiting and start counseling.  Recheck as needed.  Continue Lexapro.   Orders Placed This Encounter  Procedures  . POCT Urinalysis Dipstick   Meds ordered this encounter  Medications  . valACYclovir (VALTREX) 1000 MG tablet    Sig: Take 1 tablet (1,000 mg total) by mouth 3 (three) times daily.    Dispense:  21 tablet    Refill:  0  . traMADol (ULTRAM) 50 MG tablet    Sig: Take 1-2 tablets (50-100 mg total) by mouth every 6 (six) hours as needed.    Dispense:  15 tablet    Refill:  0  . gabapentin (NEURONTIN) 300 MG capsule    Sig: Take 1 capsule (300 mg total) by mouth 3 (three) times daily.    Dispense:  90 capsule    Refill:  0  . zolpidem (AMBIEN) 5 MG tablet    Sig: Take 1 tablet (5 mg total) by mouth at bedtime as needed for sleep.    Dispense:  30 tablet    Refill:  1     Discussed warning signs or symptoms. Please  see discharge instructions. Patient expresses understanding.

## 2017-08-14 NOTE — Patient Instructions (Addendum)
Thank you for coming in today. Start valtrex now for possible shingles.  Use tramadol sparingly for pain as needed.  Continue tylenol or ibuprofen for pain.  Take gabapentin for nerve pain related to shingles.   If your belly pain worsens, or you have high fever, bad vomiting, blood in your stool or black tarry stool go to the Emergency Room.   Continue support for mood.  Let me know if we need to adjust medicine or refer to therapy.   For insomnia lets start with ambien.   Recheck in few months.   Shingles Shingles, which is also known as herpes zoster, is an infection that causes a painful skin rash and fluid-filled blisters. Shingles is not related to genital herpes, which is a sexually transmitted infection. Shingles only develops in people who:  Have had chickenpox.  Have received the chickenpox vaccine. (This is rare.)  What are the causes? Shingles is caused by varicella-zoster virus (VZV). This is the same virus that causes chickenpox. After exposure to VZV, the virus stays in the body in an inactive (dormant) state. Shingles develops if the virus reactivates. This can happen many years after the initial exposure to VZV. It is not known what causes this virus to reactivate. What increases the risk? People who have had chickenpox or received the chickenpox vaccine are at risk for shingles. Infection is more common in people who:  Are older than age 35.  Have a weakened defense (immune) system, such as those with HIV, AIDS, or cancer.  Are taking medicines that weaken the immune system, such as transplant medicines.  Are under great stress.  What are the signs or symptoms? Early symptoms of this condition include itching, tingling, and pain in an area on your skin. Pain may be described as burning, stabbing, or throbbing. A few days or weeks after symptoms start, a painful red rash appears, usually on one side of the body in a bandlike or beltlike pattern. The rash  eventually turns into fluid-filled blisters that break open, scab over, and dry up in about 2-3 weeks. At any time during the infection, you may also develop:  A fever.  Chills.  A headache.  An upset stomach.  How is this diagnosed? This condition is diagnosed with a skin exam. Sometimes, skin or fluid samples are taken from the blisters before a diagnosis is made. These samples are examined under a microscope or sent to a lab for testing. How is this treated? There is no specific cure for this condition. Your health care provider will probably prescribe medicines to help you manage pain, recover more quickly, and avoid long-term problems. Medicines may include:  Antiviral drugs.  Anti-inflammatory drugs.  Pain medicines.  If the area involved is on your face, you may be referred to a specialist, such as an eye doctor (ophthalmologist) or an ear, nose, and throat (ENT) doctor to help you avoid eye problems, chronic pain, or disability. Follow these instructions at home: Medicines  Take medicines only as directed by your health care provider.  Apply an anti-itch or numbing cream to the affected area as directed by your health care provider. Blister and Rash Care  Take a cool bath or apply cool compresses to the area of the rash or blisters as directed by your health care provider. This may help with pain and itching.  Keep your rash covered with a loose bandage (dressing). Wear loose-fitting clothing to help ease the pain of material rubbing against the rash.  Keep your rash and blisters clean with mild soap and cool water or as directed by your health care provider.  Check your rash every day for signs of infection. These include redness, swelling, and pain that lasts or increases.  Do not pick your blisters.  Do not scratch your rash. General instructions  Rest as directed by your health care provider.  Keep all follow-up visits as directed by your health care  provider. This is important.  Until your blisters scab over, your infection can cause chickenpox in people who have never had it or been vaccinated against it. To prevent this from happening, avoid contact with other people, especially: ? Babies. ? Pregnant women. ? Children who have eczema. ? Elderly people who have transplants. ? People who have chronic illnesses, such as leukemia or AIDS. Contact a health care provider if:  Your pain is not relieved with prescribed medicines.  Your pain does not get better after the rash heals.  Your rash looks infected. Signs of infection include redness, swelling, and pain that lasts or increases. Get help right away if:  The rash is on your face or nose.  You have facial pain, pain around your eye area, or loss of feeling on one side of your face.  You have ear pain or you have ringing in your ear.  You have loss of taste.  Your condition gets worse. This information is not intended to replace advice given to you by your health care provider. Make sure you discuss any questions you have with your health care provider. Document Released: 05/26/2005 Document Revised: 01/20/2016 Document Reviewed: 04/06/2014 Elsevier Interactive Patient Education  2018 [employer].

## 2017-08-17 ENCOUNTER — Encounter: Payer: Self-pay | Admitting: Family Medicine

## 2017-08-17 MED FILL — ESCITALOPRAM 10 MG TABLET: 10 | 90 days supply | Qty: 90 | Fill #1

## 2017-08-17 NOTE — Telephone Encounter (Signed)
I called Stacy Woods and clarified transmission risk of chickenpox with shingles.  I offered stronger pain medicine and she declined.  We will continue to follow.

## 2017-08-26 ENCOUNTER — Encounter: Payer: Self-pay | Admitting: Family Medicine

## 2017-09-07 ENCOUNTER — Telehealth: Payer: No Typology Code available for payment source | Admitting: Family

## 2017-09-07 DIAGNOSIS — J029 Acute pharyngitis, unspecified: Secondary | ICD-10-CM | POA: Diagnosis not present

## 2017-09-07 MED ORDER — PREDNISONE 5 MG PO TABS: 5.0000 mg | ORAL_TABLET | ORAL | 0 refills | Status: DC

## 2017-09-07 MED ORDER — BENZONATATE 100 MG PO CAPS: 100.0000 mg | ORAL_CAPSULE | Freq: Three times a day (TID) | ORAL | 0 refills | Status: DC | PRN

## 2017-09-07 MED FILL — predniSONE 5 MG TABS: 5 | 6 days supply | Qty: 21 | Fill #0

## 2017-09-07 MED FILL — BENZONATATE 100 MG CAPSULE: 100 | 5 days supply | Qty: 30 | Fill #0

## 2017-09-07 NOTE — Progress Notes (Signed)
Thank you for the details you included in the comment boxes. Those details are very helpful in determining the best course of treatment for you and help [place] to provide the best care. I know the coughing can be extremely uncomfortable with shingles or post-shingles. I am sending a few things for you below so that you can get the cough under control.  We are sorry that you are not feeling well.  Here is how we plan to help!  Based on your presentation I believe you most likely have A cough due to a virus.  This is called viral bronchitis and is best treated by rest, plenty of fluids and control of the cough.  You may use Ibuprofen or Tylenol as directed to help your symptoms.     In addition you may use A prescription cough medication called Tessalon Perles 100mg . You may take 1-2 capsules every 8 hours as needed for your cough.  Sterapred 5 mg dosepak  From your responses in the eVisit questionnaire you describe inflammation in the upper respiratory tract which is causing a significant cough.  This is commonly called Bronchitis and has four common causes:    Allergies  Viral Infections  Acid Reflux  Bacterial Infection Allergies, viruses and acid reflux are treated by controlling symptoms or eliminating the cause. An example might be a cough caused by taking certain blood pressure medications. You stop the cough by changing the medication. Another example might be a cough caused by acid reflux. Controlling the reflux helps control the cough.  USE OF BRONCHODILATOR ("RESCUE") INHALERS: There is a risk from using your bronchodilator too frequently.  The risk is that over-reliance on a medication which only relaxes the muscles surrounding the breathing tubes can reduce the effectiveness of medications prescribed to reduce swelling and congestion of the tubes themselves.  Although you feel brief relief from the bronchodilator inhaler, your asthma may actually be worsening with the tubes becoming more  swollen and filled with mucus.  This can delay other crucial treatments, such as oral steroid medications. If you need to use a bronchodilator inhaler daily, several times per day, you should discuss this with your provider.  There are probably better treatments that could be used to keep your asthma under control.     HOME CARE . Only take medications as instructed by your medical team. . Complete the entire course of an antibiotic. . Drink plenty of fluids and get plenty of rest. . Avoid close contacts especially the very young and the elderly . Cover your mouth if you cough or cough into your sleeve. . Always remember to wash your hands . A steam or ultrasonic humidifier can help congestion.   GET HELP RIGHT AWAY IF: . You develop worsening fever. . You become short of breath . You cough up blood. . Your symptoms persist after you have completed your treatment plan MAKE SURE YOU   Understand these instructions.  Will watch your condition.  Will get help right away if you are not doing well or get worse.  Your e-visit answers were reviewed by a board certified advanced clinical practitioner to complete your personal care plan.  Depending on the condition, your plan could have included both over the counter or prescription medications. If there is a problem please reply  once you have received a response from your provider. Your safety is important to [place].  If you have drug allergies check your prescription carefully.    You can use MyChart  to ask questions about today's visit, request a non-urgent call back, or ask for a work or school excuse for 24 hours related to this e-Visit. If it has been greater than 24 hours you will need to follow up with your provider, or enter a new e-Visit to address those concerns. You will get an e-mail in the next two days asking about your experience.  I hope that your e-visit has been valuable and will speed your recovery. Thank you for using  e-visits.

## 2017-09-10 ENCOUNTER — Ambulatory Visit (INDEPENDENT_AMBULATORY_CARE_PROVIDER_SITE_OTHER): Payer: No Typology Code available for payment source | Admitting: Family Medicine

## 2017-09-10 ENCOUNTER — Encounter: Payer: Self-pay | Admitting: Family Medicine

## 2017-09-10 ENCOUNTER — Ambulatory Visit (INDEPENDENT_AMBULATORY_CARE_PROVIDER_SITE_OTHER): Payer: No Typology Code available for payment source

## 2017-09-10 VITALS — BP 115/83 | HR 65 | Temp 97.9°F | Wt 192.0 lb

## 2017-09-10 DIAGNOSIS — R05 Cough: Secondary | ICD-10-CM | POA: Diagnosis not present

## 2017-09-10 DIAGNOSIS — I10 Essential (primary) hypertension: Secondary | ICD-10-CM

## 2017-09-10 DIAGNOSIS — R059 Cough, unspecified: Secondary | ICD-10-CM

## 2017-09-10 MED ORDER — ALBUTEROL SULFATE HFA 108 (90 BASE) MCG/ACT IN AERS: 2.0000 | INHALATION_SPRAY | Freq: Four times a day (QID) | RESPIRATORY_TRACT | 0 refills | Status: DC | PRN

## 2017-09-10 MED ORDER — GUAIFENESIN-CODEINE 100-10 MG/5ML PO SOLN: 5.0000 mL | Freq: Four times a day (QID) | ORAL | 0 refills | Status: DC | PRN

## 2017-09-10 NOTE — Patient Instructions (Addendum)
Thank you for coming in today. Finish prednisone.  Use codeine cough medicine as needed.  Try albuterol for cough and tightness.  Recheck as needed,    Acute Bronchitis, Adult Acute bronchitis is sudden (acute) swelling of the air tubes (bronchi) in the lungs. Acute bronchitis causes these tubes to fill with mucus, which can make it hard to breathe. It can also cause coughing or wheezing. In adults, acute bronchitis usually goes away within 2 weeks. A cough caused by bronchitis may last up to 3 weeks. Smoking, allergies, and asthma can make the condition worse. Repeated episodes of bronchitis may cause further lung problems, such as chronic obstructive pulmonary disease (COPD). What are the causes? This condition can be caused by germs and by substances that irritate the lungs, including:  Cold and flu viruses. This condition is most often caused by the same virus that causes a cold.  Bacteria.  Exposure to tobacco smoke, dust, fumes, and air pollution.  What increases the risk? This condition is more likely to develop in people who:  Have close contact with someone with acute bronchitis.  Are exposed to lung irritants, such as tobacco smoke, dust, fumes, and vapors.  Have a weak immune system.  Have a respiratory condition such as asthma.  What are the signs or symptoms? Symptoms of this condition include:  A cough.  Coughing up clear, yellow, or green mucus.  Wheezing.  Chest congestion.  Shortness of breath.  A fever.  Body aches.  Chills.  A sore throat.  How is this diagnosed? This condition is usually diagnosed with a physical exam. During the exam, your health care provider may order tests, such as chest X-rays, to rule out other conditions. He or she may also:  Test a sample of your mucus for bacterial infection.  Check the level of oxygen in your blood. This is done to check for pneumonia.  Do a chest X-ray or lung function testing to rule out  pneumonia and other conditions.  Perform blood tests.  Your health care provider will also ask about your symptoms and medical history. How is this treated? Most cases of acute bronchitis clear up over time without treatment. Your health care provider may recommend:  Drinking more fluids. Drinking more makes your mucus thinner, which may make it easier to breathe.  Taking a medicine for a fever or cough.  Taking an antibiotic medicine.  Using an inhaler to help improve shortness of breath and to control a cough.  Using a cool mist vaporizer or humidifier to make it easier to breathe.  Follow these instructions at home: Medicines  Take over-the-counter and prescription medicines only as told by your health care provider.  If you were prescribed an antibiotic, take it as told by your health care provider. Do not stop taking the antibiotic even if you start to feel better. General instructions  Get plenty of rest.  Drink enough fluids to keep your urine clear or pale yellow.  Avoid smoking and secondhand smoke. Exposure to cigarette smoke or irritating chemicals will make bronchitis worse. If you smoke and you need help quitting, ask your health care provider. Quitting smoking will help your lungs heal faster.  Use an inhaler, cool mist vaporizer, or humidifier as told by your health care provider.  Keep all follow-up visits as told by your health care provider. This is important. How is this prevented? To lower your risk of getting this condition again:  Wash your hands often with soap and  water. If soap and water are not available, use hand sanitizer.  Avoid contact with people who have cold symptoms.  Try not to touch your hands to your mouth, nose, or eyes.  Make sure to get the flu shot every year.  Contact a health care provider if:  Your symptoms do not improve in 2 weeks of treatment. Get help right away if:  You cough up blood.  You have chest pain.  You  have severe shortness of breath.  You become dehydrated.  You faint or keep feeling like you are going to faint.  You keep vomiting.  You have a severe headache.  Your fever or chills gets worse. This information is not intended to replace advice given to you by your health care provider. Make sure you discuss any questions you have with your health care provider. Document Released: 07/03/2004 Document Revised: 12/19/2015 Document Reviewed: 11/14/2015 Elsevier Interactive Patient Education  [employer].

## 2017-09-10 NOTE — Progress Notes (Signed)
Stacy Woods is a 52 y.o. female who presents to [facility]: [place] today for cough.  Stacy Woods notes a cough and congestion present for about 6 days.  This is associated with some mild shortness of breath.  She denies chest pain or palpitations or severe shortness of breath or additional symptoms.  She had an E visit and was prescribed prednisone and Tessalon Perles.  She has the prednisone is not helping very much and the Tessalon Perles helped a little bit to control cough but not enough.  She notes the cough is obnoxious and interferes with sleep and is nonproductive.  She denies any history of asthma.  She denies any significant itchy watery eyes runny nose.   Past Medical History:  Diagnosis Date  . AK (actinic keratosis) 07/14/2017   Right upper chest wall  . Anemia   . Anemia, iron deficiency 05/02/2015  . Fibromyalgia   . History of gastric bypass 05/09/2016  . Hypertension   . Insomnia 08/14/2017  . Malabsorption of iron 05/09/2016  . PONV (postoperative nausea and vomiting)   . Surgical menopause 03/24/2016   Past Surgical History:  Procedure Laterality Date  . ABDOMINAL HYSTERECTOMY  1997  . APPENDECTOMY    . CESAREAN SECTION  [id number]  . CHOLECYSTECTOMY    . GASTRIC BYPASS  2011  . KNEE ARTHROSCOPY  2002  . TONSILLECTOMY  1977  . ULNAR NERVE TRANSPOSITION Right 06/12/2015   Procedure: RIGHT CUBITAL TUNNEL RELEASE;  Surgeon: Milly Jakob, MD;  Location: [facility];  Service: Orthopedics;  Laterality: Right;   Social History   Tobacco Use  . Smoking status: Never Smoker  . Smokeless tobacco: Never Used  Substance Use Topics  . Alcohol use: No   family history includes Breast cancer in her mother; Diabetes in her father; Heart failure in her father; Hypertension in her father; Stroke in her father.  ROS as above:  Medications: Current  Outpatient Medications  Medication Sig Dispense Refill  . AMBULATORY NON FORMULARY MEDICATION One Touch Glucometer per insurance formulary. One month of testing strips, and lancets used to check blood sugar daily. 1 Units prn  . AMBULATORY NON FORMULARY MEDICATION Continuous glucose monitor for profound hypoglycemia.  Dexcom system if able. E16.2 1 each 0  . benzonatate (TESSALON PERLES) 100 MG capsule Take 1-2 capsules (100-200 mg total) by mouth every 8 (eight) hours as needed for cough. 30 capsule 0  . blood glucose meter kit and supplies KIT Check fasting blood sugar daily. 1 each 0  . escitalopram (LEXAPRO) 10 MG tablet TAKE 1 TABLET (10 MG TOTAL) BY MOUTH DAILY. 90 tablet 3  . gabapentin (NEURONTIN) 300 MG capsule Take 1 capsule (300 mg total) by mouth 3 (three) times daily. 90 capsule 0  . glucagon 1 MG injection Inject 1 mg into the muscle once as needed. For low blood sugar 1 each 12  . glucose blood (IGLUCOSE TEST STRIPS) test strip Check fasting blood sugar 3-4 times daily. Dx: E16.2 100 each 11  . Lancets 30G MISC Check fasting blood sugar daily 50 each PRN  . magnesium oxide (MAG-OX) 400 MG tablet Take 2 tablets (800 mg total) by mouth at bedtime. 90 tablet 3  . predniSONE (DELTASONE) 5 MG tablet Take 1 tablet (5 mg total) by mouth as directed. Taper daily tabs 6,5,4,3,2,1 21 tablet 0  . traMADol (ULTRAM) 50 MG tablet Take 1-2 tablets (50-100 mg total) by mouth  every 6 (six) hours as needed. 15 tablet 0  . valACYclovir (VALTREX) 1000 MG tablet Take 1 tablet (1,000 mg total) by mouth 3 (three) times daily. 21 tablet 0  . zolpidem (AMBIEN) 5 MG tablet Take 1 tablet (5 mg total) by mouth at bedtime as needed for sleep. 30 tablet 1  . albuterol (PROVENTIL HFA;VENTOLIN HFA) 108 (90 Base) MCG/ACT inhaler Inhale 2 puffs into the lungs every 6 (six) hours as needed for wheezing or shortness of breath. 1 Inhaler 0  . guaiFENesin-codeine 100-10 MG/5ML syrup Take 5 mLs by mouth every 6 (six)  hours as needed for cough. 120 mL 0   No current facility-administered medications for this visit.    Allergies  Allergen Reactions  . Mobic [Meloxicam] Swelling  . Shellfish Allergy Nausea Only and Swelling  . Lyrica [Pregabalin] Other (See Comments)    "zombie"  . Percocet [Oxycodone-Acetaminophen] Itching    Health Maintenance Health Maintenance  Topic Date Due  . HIV Screening  07/14/2018 (Originally 02/26/1981)  . INFLUENZA VACCINE  01/07/2018  . TETANUS/TDAP  03/24/2026     Exam:  BP 115/83   Pulse 65   Temp 97.9 F (36.6 C) (Oral)   Wt 192 lb (87.1 kg)   SpO2 99%   BMI 32.94 kg/m  Gen: Well NAD HEENT: EOMI,  MMM Lungs: Normal work of breathing. CTABL Heart: RRR no MRG Abd: NABS, Soft. Nondistended, Nontender Exts: Brisk capillary refill, warm and well perfused.   2 view chest x-ray images obtained today showed no acute infiltrate, or other acute findings.  Awaiting for radiology review.    Assessment and Plan: 52 y.o. female with cough likely viral bronchitis.  Plan to continue current regimen and add codeine cough syrup at bedtime.  Formal chest x-ray read pending but pneumonia doubtful.  Additionally will try albuterol as a possible benefit for the shortness of breath.  Additionally recommend daily over-the-counter nonsedating antihistamines.  Recheck if not better.   Orders Placed This Encounter  Procedures  . DG Chest 2 View    Order Specific Question:   Reason for exam:    Answer:   Cough, assess intra-thoracic pathology    Order Specific Question:   Is the patient pregnant?    Answer:   No    Order Specific Question:   Preferred imaging location?    Answer:   [facility]   Meds ordered this encounter  Medications  . guaiFENesin-codeine 100-10 MG/5ML syrup    Sig: Take 5 mLs by mouth every 6 (six) hours as needed for cough.    Dispense:  120 mL    Refill:  0  . albuterol (PROVENTIL HFA;VENTOLIN HFA) 108 (90 Base) MCG/ACT inhaler      Sig: Inhale 2 puffs into the lungs every 6 (six) hours as needed for wheezing or shortness of breath.    Dispense:  1 Inhaler    Refill:  0     Discussed warning signs or symptoms. Please see discharge instructions. Patient expresses understanding.

## 2017-10-23 ENCOUNTER — Telehealth: Payer: No Typology Code available for payment source | Admitting: Family

## 2017-10-23 DIAGNOSIS — J028 Acute pharyngitis due to other specified organisms: Secondary | ICD-10-CM

## 2017-10-23 DIAGNOSIS — B9689 Other specified bacterial agents as the cause of diseases classified elsewhere: Secondary | ICD-10-CM

## 2017-10-23 MED ORDER — BENZONATATE 100 MG PO CAPS: 100.0000 mg | ORAL_CAPSULE | Freq: Three times a day (TID) | ORAL | 0 refills | Status: DC | PRN

## 2017-10-23 MED ORDER — AZITHROMYCIN 250 MG PO TABS: ORAL_TABLET | ORAL | 0 refills | Status: DC

## 2017-10-23 NOTE — Progress Notes (Signed)

## 2017-11-03 ENCOUNTER — Ambulatory Visit (INDEPENDENT_AMBULATORY_CARE_PROVIDER_SITE_OTHER): Payer: No Typology Code available for payment source | Admitting: Family Medicine

## 2017-11-03 ENCOUNTER — Encounter: Payer: Self-pay | Admitting: Family Medicine

## 2017-11-03 VITALS — BP 104/74 | HR 74 | Ht 64.0 in | Wt 188.0 lb

## 2017-11-03 DIAGNOSIS — F3340 Major depressive disorder, recurrent, in remission, unspecified: Secondary | ICD-10-CM | POA: Insufficient documentation

## 2017-11-03 DIAGNOSIS — F331 Major depressive disorder, recurrent, moderate: Secondary | ICD-10-CM

## 2017-11-03 DIAGNOSIS — F411 Generalized anxiety disorder: Secondary | ICD-10-CM

## 2017-11-03 MED ORDER — ESCITALOPRAM OXALATE 20 MG PO TABS: 20.0000 mg | ORAL_TABLET | Freq: Every day | ORAL | 1 refills | Status: DC

## 2017-11-03 MED FILL — ESCITALOPRAM 20 MG TABLET: 20 | 90 days supply | Qty: 90 | Fill #0

## 2017-11-03 NOTE — Patient Instructions (Signed)
Thank you for coming in today. Increase lexapro to 20mg  daily.  Attend Therapy/consueling.  Recheck with me in 2 weeks.  Let me know if you are worsening.   You should hear from [facility].     Major Depressive Disorder, Adult Major depressive disorder (MDD) is a mental health condition. It may also be called clinical depression or unipolar depression. MDD usually causes feelings of sadness, hopelessness, or helplessness. MDD can also cause physical symptoms. It can interfere with work, school, relationships, and other everyday activities. MDD may be mild, moderate, or severe. It may occur once (single episode major depressive disorder) or it may occur multiple times (recurrent major depressive disorder). What are the causes? The exact cause of this condition is not known. MDD is most likely caused by a combination of things, which may include:  Genetic factors. These are traits that are passed along from parent to child.  Individual factors. Your personality, your behavior, and the way you handle your thoughts and feelings may contribute to MDD. This includes personality traits and behaviors learned from others.  Physical factors, such as: ? Differences in the part of your brain that controls emotion. This part of your brain may be different than it is in people who do not have MDD. ? Long-term (chronic) medical or psychiatric illnesses.  Social factors. Traumatic experiences or major life changes may play a role in the development of MDD.  What increases the risk? This condition is more likely to develop in women. The following factors may also make you more likely to develop MDD:  A family history of depression.  Troubled family relationships.  Abnormally low levels of certain brain chemicals.  Traumatic events in childhood, especially abuse or the loss of a parent.  Being under a lot of stress, or long-term stress, especially from upsetting life experiences or  losses.  A history of: ? Chronic physical illness. ? Other mental health disorders. ? Substance abuse.  Poor living conditions.  Experiencing social exclusion or discrimination on a regular basis.  What are the signs or symptoms? The main symptoms of MDD typically include:  Constant depressed or irritable mood.  Loss of interest in things and activities.  MDD symptoms may also include:  Sleeping or eating too much or too little.  Unexplained weight change.  Fatigue or low energy.  Feelings of worthlessness or guilt.  Difficulty thinking clearly or making decisions.  Thoughts of suicide or of harming others.  Physical agitation or weakness.  Isolation.  Severe cases of MDD may also occur with other symptoms, such as:  Delusions or hallucinations, in which you imagine things that are not real (psychotic depression).  Low-level depression that lasts at least a year (chronic depression or persistent depressive disorder).  Extreme sadness and hopelessness (melancholic depression).  Trouble speaking and moving (catatonic depression).  How is this diagnosed? This condition may be diagnosed based on:  Your symptoms.  Your medical history, including your mental health history. This may involve tests to evaluate your mental health. You may be asked questions about your lifestyle, including any drug and alcohol use, and how long you have had symptoms of MDD.  A physical exam.  Blood tests to rule out other conditions.  You must have a depressed mood and at least four other MDD symptoms most of the day, nearly every day in the same 2-week timeframe before your health care provider can confirm a diagnosis of MDD. How is this treated? This condition is usually treated  by mental health professionals, such as psychologists, psychiatrists, and clinical social workers. You may need more than one type of treatment. Treatment may include:  Psychotherapy. This is also called  talk therapy or counseling. Types of psychotherapy include: ? Cognitive behavioral therapy (CBT). This type of therapy teaches you to recognize unhealthy feelings, thoughts, and behaviors, and replace them with positive thoughts and actions. ? Interpersonal therapy (IPT). This helps you to improve the way you relate to and communicate with others. ? Family therapy. This treatment includes members of your family.  Medicine to treat anxiety and depression, or to help you control certain emotions and behaviors.  Lifestyle changes, such as: ? Limiting alcohol and drug use. ? Exercising regularly. ? Getting plenty of sleep. ? Making healthy eating choices. ? Spending more time outdoors.  Treatments involving stimulation of the brain can be used in situations with extremely severe symptoms, or when medicine or other therapies do not work over time. These treatments include electroconvulsive therapy, transcranial magnetic stimulation, and vagal nerve stimulation. Follow these instructions at home: Activity  Return to your normal activities as told by your health care provider.  Exercise regularly and spend time outdoors as told by your health care provider. General instructions  Take over-the-counter and prescription medicines only as told by your health care provider.  Do not drink alcohol. If you drink alcohol, limit your alcohol intake to no more than 1 drink a day for nonpregnant women and 2 drinks a day for men. One drink equals 12 oz of beer, 5 oz of wine, or 1 oz of hard liquor. Alcohol can affect any antidepressant medicines you are taking. Talk to your health care provider about your alcohol use.  Eat a healthy diet and get plenty of sleep.  Find activities that you enjoy doing, and make time to do them.  Consider joining a support group. Your health care provider may be able to recommend a support group.  Keep all follow-up visits as told by your health care provider. This is  important. Where to find more information: [employer] on Mental Illness  www.nami.org  U.S. National Institute of Mental Health  https://carter.com/  National Suicide Prevention Lifeline  1-800-273-TALK (910) 512-6189). This is free, 24-hour help.  Contact a health care provider if:  Your symptoms get worse.  You develop new symptoms. Get help right away if:  You self-harm.  You have serious thoughts about hurting yourself or others.  You see, hear, taste, smell, or feel things that are not present (hallucinate). This information is not intended to replace advice given to you by your health care provider. Make sure you discuss any questions you have with your health care provider. Document Released: 09/20/2012 Document Revised: 01/31/2016 Document Reviewed: 12/05/2015 Elsevier Interactive Patient Education  [employer].

## 2017-11-03 NOTE — Telephone Encounter (Signed)
Called Pt, scheduled appt for today.

## 2017-11-03 NOTE — Progress Notes (Signed)
Stacy Woods is a 52 y.o. female who presents to [facility] today for depression.   Patient reports 3 weeks of severe depression. She is currently dealing with a divorce, which is causing a lot of severe stress. She reports severe agitation, crying, anhedonia, and insomnia. She denies suicidal ideation. She is taking Lexapro 10 mg as prescribed with little improvement. She is also taking Ambien for her sleep. She endorses some nausea with eating. She states she is also stressed about changes to her work environment. She will be working from home more, which she agrees is not best for her mood.    ROS as above:  Exam:  BP 104/74   Pulse 74   Ht 5' 4"  (1.626 m)   Wt 188 lb (85.3 kg)   BMI 32.27 kg/m  Gen: Well NAD, tearful, HEENT: EOMI,  MMM Lungs: Normal work of breathing. CTABL Heart: RRR no MRG Abd: NABS, Soft. Nondistended, Nontender Exts: Brisk capillary refill, warm and well perfused.  Psychiatric: Tearful affect normal speech and thought process.  No SI or HI.  Depression screen [facility] 2/9 11/03/2017 08/14/2017 01/29/2017 03/24/2016  Decreased Interest 3 1 0 0  Down, Depressed, Hopeless 2 0 0 0  PHQ - 2 Score 5 1 0 0  Altered sleeping 2 3 - -  Tired, decreased energy 3 3 - -  Change in appetite 3 1 - -  Feeling bad or failure about yourself  2 1 - -  Trouble concentrating 1 0 - -  Moving slowly or fidgety/restless 0 0 - -  Suicidal thoughts 0 0 - -  PHQ-9 Score 16 9 - -  Difficult doing work/chores Very difficult Not difficult at all - -   GAD 7 : Generalized Anxiety Score 11/03/2017 08/14/2017  Nervous, Anxious, on Edge 3 1  Control/stop worrying 2 1  Worry too much - different things 3 1  Trouble relaxing 3 1  Restless 2 1  Easily annoyed or irritable 3 1  Afraid - awful might happen 0 0  Total GAD 7 Score 16 6  Anxiety Difficulty - Not difficult at  all      Assessment and Plan: 52 y.o. female with depression.  Stacy Woods is going through a very stressful period in her life and is having trouble coping.  I do not think she is a risk to herself, but her quality of life is significantly impacted. She is open to different avenues of treatment. I recommend that she seek counseling as well as an increase in her Lexapro from 10 mg to 20 mg. She should contact me if she does not improve or she begins having suicidal thoughts. We will follow her closely through this episode and she will follow up in two weeks.   Orders Placed This Encounter  Procedures  . Ambulatory referral to Behavioral Health    Referral Priority:   Routine    Referral Type:   Psychiatric    Referral Reason:   Specialty Services Required    Requested Specialty:   Behavioral Health    Number of Visits Requested:   1   Meds ordered this encounter  Medications  . escitalopram (LEXAPRO) 20 MG tablet    Sig: Take 1 tablet (20 mg total) by mouth daily.    Dispense:  90 tablet    Refill:  1     Historical information moved to improve visibility of documentation.  Past  Medical History:  Diagnosis Date  . AK (actinic keratosis) 07/14/2017   Right upper chest wall  . Anemia   . Anemia, iron deficiency 05/02/2015  . Fibromyalgia   . History of gastric bypass 05/09/2016  . Hypertension   . Insomnia 08/14/2017  . Malabsorption of iron 05/09/2016  . PONV (postoperative nausea and vomiting)   . Surgical menopause 03/24/2016   Past Surgical History:  Procedure Laterality Date  . ABDOMINAL HYSTERECTOMY  1997  . APPENDECTOMY    . CESAREAN SECTION  [id number]  . CHOLECYSTECTOMY    . GASTRIC BYPASS  2011  . KNEE ARTHROSCOPY  2002  . TONSILLECTOMY  1977  . ULNAR NERVE TRANSPOSITION Right 06/12/2015   Procedure: RIGHT CUBITAL TUNNEL RELEASE;  Surgeon: Milly Jakob, MD;  Location: [facility];  Service: Orthopedics;  Laterality: Right;   Social History   Tobacco  Use  . Smoking status: Never Smoker  . Smokeless tobacco: Never Used  Substance Use Topics  . Alcohol use: No   family history includes Breast cancer in her mother; Diabetes in her father; Heart failure in her father; Hypertension in her father; Stroke in her father.  Medications: Current Outpatient Medications  Medication Sig Dispense Refill  . AMBULATORY NON FORMULARY MEDICATION One Touch Glucometer per insurance formulary. One month of testing strips, and lancets used to check blood sugar daily. 1 Units prn  . AMBULATORY NON FORMULARY MEDICATION Continuous glucose monitor for profound hypoglycemia.  Dexcom system if able. E16.2 1 each 0  . blood glucose meter kit and supplies KIT Check fasting blood sugar daily. 1 each 0  . escitalopram (LEXAPRO) 20 MG tablet Take 1 tablet (20 mg total) by mouth daily. 90 tablet 1  . glucagon 1 MG injection Inject 1 mg into the muscle once as needed. For low blood sugar 1 each 12  . glucose blood (IGLUCOSE TEST STRIPS) test strip Check fasting blood sugar 3-4 times daily. Dx: E16.2 100 each 11  . Lancets 30G MISC Check fasting blood sugar daily 50 each PRN  . magnesium oxide (MAG-OX) 400 MG tablet Take 2 tablets (800 mg total) by mouth at bedtime. 90 tablet 3  . zolpidem (AMBIEN) 5 MG tablet Take 1 tablet (5 mg total) by mouth at bedtime as needed for sleep. 30 tablet 1   No current facility-administered medications for this visit.    Allergies  Allergen Reactions  . Mobic [Meloxicam] Swelling  . Shellfish Allergy Nausea Only and Swelling  . Lyrica [Pregabalin] Other (See Comments)    "zombie"  . Percocet [Oxycodone-Acetaminophen] Itching    Health Maintenance Health Maintenance  Topic Date Due  . HIV Screening  07/14/2018 (Originally 02/26/1981)  . INFLUENZA VACCINE  01/07/2018  . TETANUS/TDAP  03/24/2026    Discussed warning signs or symptoms. Please see discharge instructions. Patient expresses understanding.

## 2017-11-04 ENCOUNTER — Ambulatory Visit (INDEPENDENT_AMBULATORY_CARE_PROVIDER_SITE_OTHER): Payer: No Typology Code available for payment source | Admitting: Licensed Clinical Social Worker

## 2017-11-04 DIAGNOSIS — F4323 Adjustment disorder with mixed anxiety and depressed mood: Secondary | ICD-10-CM | POA: Diagnosis not present
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
## 2017-11-17 ENCOUNTER — Encounter: Payer: Self-pay | Admitting: Family Medicine

## 2017-11-17 ENCOUNTER — Ambulatory Visit (INDEPENDENT_AMBULATORY_CARE_PROVIDER_SITE_OTHER): Payer: No Typology Code available for payment source | Admitting: Family Medicine

## 2017-11-17 VITALS — BP 108/71 | HR 65 | Ht 64.0 in | Wt 189.0 lb

## 2017-11-17 DIAGNOSIS — F330 Major depressive disorder, recurrent, mild: Secondary | ICD-10-CM

## 2017-11-17 DIAGNOSIS — G4700 Insomnia, unspecified: Secondary | ICD-10-CM

## 2017-11-17 MED ORDER — ZOLPIDEM TARTRATE 5 MG PO TABS: 5.0000 mg | ORAL_TABLET | Freq: Every evening | ORAL | 3 refills | Status: DC | PRN

## 2017-11-17 MED FILL — ZOLPIDEM TARTRATE 5 MG TAB: 5 | 30 days supply | Qty: 30 | Fill #0

## 2017-11-17 NOTE — Progress Notes (Signed)
Stacy Woods is a 52 y.o. female who presents to [facility] today for follow-up mood and insomnia. Stacy Woods was seen on May 29 for major depression.  At that time she was started on Lexapro 20 mg daily and asked to see a licensed clinical social worker for counseling.  She notes that she started Lexapro and tolerates it well.  She notes that she is significantly better than she was last visit but not completely better.  She denies any SI or HI.  She notes that she is currently going through divorce and it still quite frustrating but is currently moving to arbitration for distribution of assets.  She find counseling to be mildly helpful but does not have a scheduled follow-up as she thinks she is improving.   Additionally patient notes insomnia.  This is been typically well managed with Ambien.  She notes it works quite well and she would like a refill if possible.  She denies significant next day fatigue.   ROS as above:  Exam:  BP 108/71   Pulse 65   Ht 5' 4" (1.626 m)   Wt 189 lb (85.7 kg)   BMI 32.44 kg/m  Gen: Well NAD HEENT: EOMI,  MMM Lungs: Normal work of breathing. CTABL Heart: RRR no MRG Abd: NABS, Soft. Nondistended, Nontender Exts: Brisk capillary refill, warm and well perfused.  Psych alert and oriented normal speech thought process and affect no SI or HI expressed.  Depression screen [facility] 2/9 11/17/2017 11/03/2017 08/14/2017 01/29/2017 03/24/2016  Decreased Interest _0 0 0  Down, Depressed, Hopeless 1 2 0 0 0  PHQ - 2 Score _1 0 0  Altered sleeping _2 - -  Tired, decreased energy _3 - -  Change in appetite _4 - -  Feeling bad or failure about yourself  0 2 1 - -  Trouble concentrating 0 1 0 - -  Moving slowly or fidgety/restless 0 0 0 - -  Suicidal thoughts 0 0 0 - -  PHQ-9 Score _5 - -  Difficult doing work/chores Not difficult at all  Very difficult Not difficult at all - -   GAD 7 : Generalized Anxiety Score 11/17/2017 11/03/2017 08/14/2017  Nervous, Anxious, on Edge _6 Control/stop worrying _7 Worry too much - different things _8 Trouble relaxing _9 Restless 0 2 1  Easily annoyed or irritable _10 Afraid - awful might happen 0 0 0  Total GAD 7 Score _11 Anxiety Difficulty Somewhat difficult - Not difficult at all     Assessment and Plan: 52 y.o. female with  Depression.  Improved with occasions and therapy.  Plan to continue Lexapro 20 mg daily.  Self-guided cognitive behavioral therapy techniques.  Follow-up with LCSW as needed.  Recheck with me as scheduled on July 29.  Return sooner if needed.  Insomnia: Stable.  Plan to continue Ambien.  Recheck as scheduled or sooner if needed.   No orders of the defined types were placed in this encounter.  Meds ordered this encounter  Medications  . zolpidem (AMBIEN) 5 MG tablet    Sig: Take 1 tablet (5 mg total) by mouth at bedtime as needed for sleep.    Dispense:  30 tablet    Refill:  3     Historical  information moved to improve visibility of documentation.  Past Medical History:  Diagnosis Date  . AK (actinic keratosis) 07/14/2017   Right upper chest wall  . Anemia   . Anemia, iron deficiency 05/02/2015  . Fibromyalgia   . History of gastric bypass 05/09/2016  . Hypertension   . Insomnia 08/14/2017  . Malabsorption of iron 05/09/2016  . PONV (postoperative nausea and vomiting)   . Surgical menopause 03/24/2016   Past Surgical History:  Procedure Laterality Date  . ABDOMINAL HYSTERECTOMY  1997  . APPENDECTOMY    . CESAREAN SECTION  1991,1994  . CHOLECYSTECTOMY    . GASTRIC BYPASS  2011  . KNEE ARTHROSCOPY  2002  . TONSILLECTOMY  1977  . ULNAR NERVE TRANSPOSITION Right 06/12/2015   Procedure: RIGHT CUBITAL TUNNEL RELEASE;  Surgeon: David Thompson, MD;  Location: [facility];  Service: Orthopedics;  Laterality: Right;    Social History   Tobacco Use  . Smoking status: Never Smoker  . Smokeless tobacco: Never Used  Substance Use Topics  . Alcohol use: No   family history includes Breast cancer in her mother; Diabetes in her father; Heart failure in her father; Hypertension in her father; Stroke in her father.  Medications: Current Outpatient Medications  Medication Sig Dispense Refill  . AMBULATORY NON FORMULARY MEDICATION One Touch Glucometer per insurance formulary. One month of testing strips, and lancets used to check blood sugar daily. 1 Units prn  . AMBULATORY NON FORMULARY MEDICATION Continuous glucose monitor for profound hypoglycemia.  Dexcom system if able. E16.2 1 each 0  . blood glucose meter kit and supplies KIT Check fasting blood sugar daily. 1 each 0  . escitalopram (LEXAPRO) 20 MG tablet Take 1 tablet (20 mg total) by mouth daily. 90 tablet 1  . glucagon 1 MG injection Inject 1 mg into the muscle once as needed. For low blood sugar 1 each 12  . glucose blood (IGLUCOSE TEST STRIPS) test strip Check fasting blood sugar 3-4 times daily. Dx: E16.2 100 each 11  . Lancets 30G MISC Check fasting blood sugar daily 50 each PRN  . magnesium oxide (MAG-OX) 400 MG tablet Take 2 tablets (800 mg total) by mouth at bedtime. 90 tablet 3  . zolpidem (AMBIEN) 5 MG tablet Take 1 tablet (5 mg total) by mouth at bedtime as needed for sleep. 30 tablet 3   No current facility-administered medications for this visit.    Allergies  Allergen Reactions  . Mobic [Meloxicam] Swelling  . Shellfish Allergy Nausea Only and Swelling  . Lyrica [Pregabalin] Other (See Comments)    "zombie"  . Percocet [Oxycodone-Acetaminophen] Itching    Health Maintenance Health Maintenance  Topic Date Due  . HIV Screening  07/14/2018 (Originally 02/26/1981)  . INFLUENZA VACCINE  01/07/2018  . TETANUS/TDAP  03/24/2026    Discussed warning signs or symptoms. Please see discharge instructions. Patient expresses  understanding.  

## 2017-11-17 NOTE — Patient Instructions (Addendum)
Thank you for coming in today. Continue current medications.  Return on July 29th as scheduled.

## 2017-11-29 ENCOUNTER — Encounter: Payer: Self-pay | Admitting: Family Medicine

## 2017-11-30 ENCOUNTER — Ambulatory Visit: Payer: No Typology Code available for payment source

## 2017-11-30 ENCOUNTER — Ambulatory Visit (INDEPENDENT_AMBULATORY_CARE_PROVIDER_SITE_OTHER): Payer: No Typology Code available for payment source | Admitting: Family Medicine

## 2017-11-30 ENCOUNTER — Encounter: Payer: Self-pay | Admitting: Family Medicine

## 2017-11-30 VITALS — BP 116/78 | HR 63 | Ht 64.02 in | Wt 187.0 lb

## 2017-11-30 DIAGNOSIS — M7989 Other specified soft tissue disorders: Secondary | ICD-10-CM | POA: Diagnosis not present

## 2017-11-30 LAB — CBC
HCT: 40.2 % (ref 35.0–45.0)
Hemoglobin: 13.7 g/dL (ref 11.7–15.5)
MCH: 31.4 pg (ref 27.0–33.0)
MCHC: 34.1 g/dL (ref 32.0–36.0)
MCV: 92.2 fL (ref 80.0–100.0)
MPV: 11.2 fL (ref 7.5–12.5)
PLATELETS: 298 10*3/uL (ref 140–400)
RBC: 4.36 10*6/uL (ref 3.80–5.10)
RDW: 13 % (ref 11.0–15.0)
WBC: 4.3 10*3/uL (ref 3.8–10.8)

## 2017-11-30 LAB — COMPLETE METABOLIC PANEL WITH GFR
AG RATIO: 1.6 (calc) (ref 1.0–2.5)
ALT: 38 U/L — AB (ref 6–29)
AST: 38 U/L — AB (ref 10–35)
Albumin: 4.1 g/dL (ref 3.6–5.1)
Alkaline phosphatase (APISO): 122 U/L — ABNORMAL HIGH (ref 33–115)
BILIRUBIN TOTAL: 0.5 mg/dL (ref 0.2–1.2)
BUN: 11 mg/dL (ref 7–25)
CALCIUM: 9.1 mg/dL (ref 8.6–10.2)
CHLORIDE: 112 mmol/L — AB (ref 98–110)
CO2: 21 mmol/L (ref 20–32)
Creat: 0.81 mg/dL (ref 0.50–1.10)
GFR, Est African American: 99 mL/min/{1.73_m2} (ref >=60)
GFR, Est Non African American: 85 mL/min/{1.73_m2} (ref >=60)
GLUCOSE: 81 mg/dL (ref 65–99)
Globulin: 2.5 g/dL (calc) (ref 1.9–3.7)
POTASSIUM: 3.9 mmol/L (ref 3.5–5.3)
Sodium: 142 mmol/L (ref 135–146)
Total Protein: 6.6 g/dL (ref 6.1–8.1)

## 2017-11-30 NOTE — Patient Instructions (Signed)
Thank you for coming in today. Get [place] and labs.  I will contact you with results.     Deep Vein Thrombosis Deep vein thrombosis (DVT) is a condition in which a blood clot forms in a deep vein, such as a lower leg, thigh, or arm vein. A clot is blood that has thickened into a gel or solid. This condition is dangerous. It can lead to serious and even life-threatening complications if the clot travels to the lungs and causes a blockage (pulmonary embolism). It can also damage veins in the leg. This can result in leg pain, swelling, discoloration, and sores (post-thrombotic syndrome). What are the causes? This condition may be caused by:  A slowdown of blood flow.  Damage to a vein.  A condition that makes blood clot more easily.  What increases the risk? The following factors may make you more likely to develop this condition:  Being overweight.  Being elderly, especially over age 61.  Sitting or lying down for more than four hours.  Lack of physical activity (sedentary lifestyle).  Being pregnant, giving birth, or having recently given birth.  Taking medicines that contain estrogen.  Smoking.  A history of any of the following: ? Blood clots or blood clotting disease. ? Peripheral vascular disease. ? Inflammatory bowel disease. ? Cancer. ? Heart disease. ? Genetic conditions that affect how blood clots. ? Neurological diseases that affect the legs (leg paresis). ? Injury. ? Major or lengthy surgery. ? A central line placed inside a large vein.  What are the signs or symptoms? Symptoms of this condition include:  Swelling, pain, or tenderness in an arm or leg.  Warmth, redness, or discoloration in an arm or leg.  If the clot is in your leg, symptoms may be more noticeable or worse when you stand or walk. Some people do not have any symptoms. How is this diagnosed? This condition is diagnosed with:  A medical history.  A physical exam.  Tests, such  as: ? Blood tests. These are done to see how your blood clots. ? Imaging tests. These are done to check for clots. Tests may include:  Ultrasound.  CT scan.  MRI.  X-ray.  Venogram. For this test, X-rays are taken after a dye is injected into a vein.  How is this treated? Treatment for this condition depends on the cause, your risk for bleeding or developing more clots, and any medical conditions you have. Treatment may include:  Taking blood thinners (also called anticoagulants). These medicines may be taken by mouth, injected under the skin, or injected through an IV tube (catheter). These medicines prevent clots from forming.  Injecting medicine that dissolves blood clots into the affected vein (catheter-directed thrombolysis).  Having surgery. Surgery may be done to: ? Remove the clot. ? Place a filter in a large vein to catch blood clots before they reach the lungs.  Some treatments may be continued for up to six months. Follow these instructions at home: If you are taking an oral blood thinner:  Take the medicine exactly as told by your health care provider. Some blood thinners need to be taken at the same time every day. Do not skip a dose.  Ask your health care provider about what foods and drugs interact with the medicine.  Ask about possible side effects. General instructions  Blood thinners can cause easy bruising and difficulty stopping bleeding. Because of this, if you are taking or were given a blood thinner: ? Hold pressure over  cuts for longer than usual. ? Tell your dentist and other health care providers that you are taking blood thinners before having any procedures that can cause bleeding. ? Avoid contact sports.  Take over-the-counter and prescription medicines only as told by your health care provider.  Return to your normal activities as told by your health care provider. Ask your health care provider what activities are safe for you.  Wear  compression stockings if recommended by your health care provider.  Keep all follow-up visits as told by your health care provider. This is important. How is this prevented? To lower your risk of developing this condition again:  For 30 or more minutes every day, do an activity that: ? Involves moving your arms and legs. ? Increases your heart rate.  When traveling for longer than four hours: ? Exercise your arms and legs every hour. ? Drink plenty of water. ? Avoid drinking alcohol.  Avoid sitting or lying for a long time without moving your legs.  Stay a healthy weight.  If you are a woman who is older than age 74, avoid unnecessary use of medicines that contain estrogen.  Do not use any products that contain nicotine or tobacco, such as cigarettes and e-cigarettes. This is especially important if you take estrogen medicines. If you need help quitting, ask your health care provider.  Contact a health care provider if:  You miss a dose of your blood thinner.  You have nausea, vomiting, or diarrhea that lasts for more than one day.  Your menstrual period is heavier than usual.  You have unusual bruising. Get help right away if:  You have new or increased pain, swelling, or redness in an arm or leg.  You have numbness or tingling in an arm or leg.  You have shortness of breath.  You have chest pain.  You have a rapid or irregular heartbeat.  You feel light-headed or dizzy.  You cough up blood.  There is blood in your vomit, stool, or urine.  You have a serious fall or accident, or you hit your head.  You have a severe headache or confusion.  You have a cut that will not stop bleeding. These symptoms may represent a serious problem that is an emergency. Do not wait to see if the symptoms will go away. Get medical help right away. Call your local emergency services (911 in the [place] Do not drive yourself to the hospital. Summary  DVT is a condition in which a  blood clot forms in a deep vein, such as a lower leg, thigh, or arm vein.  Symptoms can include swelling, warmth, pain, and redness in your leg or arm.  Treatment may include taking blood thinners, injecting medicine that dissolves blood clots,wearing compression stockings, or surgery.  If you are prescribed blood thinners, take them exactly as told. This information is not intended to replace advice given to you by your health care provider. Make sure you discuss any questions you have with your health care provider. Document Released: 05/26/2005 Document Revised: 06/28/2016 Document Reviewed: 06/28/2016 Elsevier Interactive Patient Education  2018 [employer].

## 2017-11-30 NOTE — Progress Notes (Signed)
Stacy Woods is a 52 y.o. female who presents to [facility] today for left calf pain and swelling.  Patient notes pain and mild swelling of the left calf present since June 20.  She notes some palpable areas of tenderness at the lateral anterior calf as well as the posterior calf.  She notes is worse with motion and better with rest.  She denies fevers or chills nausea vomiting or diarrhea.  She is tried ice and heat which helped a bit.  She denies any personal history of DVT.  She denies chest pain palpitations or shortness of breath.  She is concerned because she increased her Lexapro dose recently and notes the DVT is a rarely of listed side effect of this medication.   ROS as above:  Exam:  BP 116/78   Pulse 63   Ht 5' 4.02" (1.626 m)   Wt 187 lb (84.8 kg)   SpO2 100%   BMI 32.08 kg/m  Gen: Well NAD HEENT: EOMI,  MMM Lungs: Normal work of breathing. CTABL Heart: RRR no MRG Abd: NABS, Soft. Nondistended, Nontender Exts: Brisk capillary refill, warm and well perfused.  Left calf normal diameter without significant edema.  Palpable tender cord at the lateral anterior calf and on posterior calf.  Positive calf squeeze test.      Assessment and Plan: 52 y.o. female with  Left calf pain and swelling with palpable cords concerning for DVT versus calf strain versus superficial thrombophlebitis.  Plan for duplex ultrasound evaluating for DVT today.  Additional check CBC metabolic panel.  If DVT positive we will treat with Eliquis with coupon sample provided  If no DVT will treat symptomatically.   Orders Placed This Encounter  Procedures  . US Venous Img Lower Unilateral Left    Standing Status:   Future    Number of Occurrences:   1    Standing Expiration Date:   01/31/2019    Order Specific Question:   Reason for Exam (SYMPTOM  OR DIAGNOSIS REQUIRED)    Answer:    eval left left swelling and palpaable cords. Suspect DVT vs superficial VT    Order Specific Question:   Preferred imaging location?    Answer:   [facility]  . CBC  . COMPLETE METABOLIC PANEL WITH GFR   No orders of the defined types were placed in this encounter.    Historical information moved to improve visibility of documentation.  Past Medical History:  Diagnosis Date  . AK (actinic keratosis) 07/14/2017   Right upper chest wall  . Anemia   . Anemia, iron deficiency 05/02/2015  . Fibromyalgia   . History of gastric bypass 05/09/2016  . Hypertension   . Insomnia 08/14/2017  . Malabsorption of iron 05/09/2016  . PONV (postoperative nausea and vomiting)   . Surgical menopause 03/24/2016   Past Surgical History:  Procedure Laterality Date  . ABDOMINAL HYSTERECTOMY  1997  . APPENDECTOMY    . CESAREAN SECTION  [id number]  . CHOLECYSTECTOMY    . GASTRIC BYPASS  2011  . KNEE ARTHROSCOPY  2002  . TONSILLECTOMY  1977  . ULNAR NERVE TRANSPOSITION Right 06/12/2015   Procedure: RIGHT CUBITAL TUNNEL RELEASE;  Surgeon: Milly Jakob, MD;  Location: [facility];  Service: Orthopedics;  Laterality: Right;   Social History   Tobacco Use  . Smoking status: Never Smoker  . Smokeless tobacco: Never Used  Substance Use Topics  .  Alcohol use: No   family history includes Breast cancer in her mother; Diabetes in her father; Heart failure in her father; Hypertension in her father; Stroke in her father.  Medications: Current Outpatient Medications  Medication Sig Dispense Refill  . AMBULATORY NON FORMULARY MEDICATION One Touch Glucometer per insurance formulary. One month of testing strips, and lancets used to check blood sugar daily. 1 Units prn  . AMBULATORY NON FORMULARY MEDICATION Continuous glucose monitor for profound hypoglycemia.  Dexcom system if able. E16.2 1 each 0  . blood glucose meter kit and supplies KIT Check fasting blood sugar daily. 1 each 0  .  escitalopram (LEXAPRO) 20 MG tablet Take 1 tablet (20 mg total) by mouth daily. 90 tablet 1  . glucagon 1 MG injection Inject 1 mg into the muscle once as needed. For low blood sugar 1 each 12  . glucose blood (IGLUCOSE TEST STRIPS) test strip Check fasting blood sugar 3-4 times daily. Dx: E16.2 100 each 11  . Lancets 30G MISC Check fasting blood sugar daily 50 each PRN  . magnesium oxide (MAG-OX) 400 MG tablet Take 2 tablets (800 mg total) by mouth at bedtime. 90 tablet 3  . zolpidem (AMBIEN) 5 MG tablet Take 1 tablet (5 mg total) by mouth at bedtime as needed for sleep. 30 tablet 3   No current facility-administered medications for this visit.    Allergies  Allergen Reactions  . Mobic [Meloxicam] Swelling  . Shellfish Allergy Nausea Only and Swelling  . Lyrica [Pregabalin] Other (See Comments)    "zombie"  . Percocet [Oxycodone-Acetaminophen] Itching     Discussed warning signs or symptoms. Please see discharge instructions. Patient expresses understanding.

## 2017-12-01 ENCOUNTER — Encounter: Payer: Self-pay | Admitting: Family Medicine

## 2017-12-01 DIAGNOSIS — R740 Nonspecific elevation of levels of transaminase and lactic acid dehydrogenase [LDH]: Secondary | ICD-10-CM

## 2017-12-01 DIAGNOSIS — R7401 Elevation of levels of liver transaminase levels: Secondary | ICD-10-CM | POA: Insufficient documentation

## 2017-12-16 MED FILL — ZOLPIDEM TARTRATE 5 MG TAB: 5 | 30 days supply | Qty: 30 | Fill #1

## 2017-12-29 MED FILL — FREESTYLE LITE TEST STRIP: 25 days supply | Qty: 100 | Fill #1

## 2018-01-04 ENCOUNTER — Ambulatory Visit (INDEPENDENT_AMBULATORY_CARE_PROVIDER_SITE_OTHER): Payer: No Typology Code available for payment source | Admitting: Family Medicine

## 2018-01-04 ENCOUNTER — Encounter: Payer: Self-pay | Admitting: Family Medicine

## 2018-01-04 VITALS — BP 120/76 | HR 62 | Wt 187.0 lb

## 2018-01-04 DIAGNOSIS — F330 Major depressive disorder, recurrent, mild: Secondary | ICD-10-CM | POA: Diagnosis not present

## 2018-01-04 DIAGNOSIS — R740 Nonspecific elevation of levels of transaminase and lactic acid dehydrogenase [LDH]: Secondary | ICD-10-CM

## 2018-01-04 DIAGNOSIS — R7401 Elevation of levels of liver transaminase levels: Secondary | ICD-10-CM

## 2018-01-04 DIAGNOSIS — F411 Generalized anxiety disorder: Secondary | ICD-10-CM | POA: Diagnosis not present

## 2018-01-04 MED ORDER — ESCITALOPRAM OXALATE 10 MG PO TABS: 10.0000 mg | ORAL_TABLET | Freq: Every day | ORAL | 1 refills | Status: DC

## 2018-01-04 MED FILL — ESCITALOPRAM 10 MG TABLET: 10 | 90 days supply | Qty: 90 | Fill #0

## 2018-01-04 NOTE — Progress Notes (Signed)
Stacy Woods is a 52 y.o. female who presents to [facility] today for leg swelling follow up.   She was seen 5 weeks ago for unilateral swelling and pain in her left calf. She says the she has implemented some exercises and is trying to walk and move around more and the swelling and pain has resolved. She has no further concerns regarding her leg swelling.   Transaminitis: she had mild transaminitis noted in her labs from her leg swelling work up   Depression: She has previously suffered from depression. She says her mood is doing well currently and has not had any major depression symptoms. She is requesting decreasing her Lexapro at this time.     ROS as above:  Exam:  BP 120/76   Pulse 62   Wt 187 lb (84.8 kg)   BMI 32.08 kg/m  Gen: Well NAD HEENT: EOMI,  MMM Lungs: Normal work of breathing. CTABL Heart: RRR no MRG Abd: NABS, Soft. Nondistended, Nontender Exts: Brisk capillary refill, warm and well perfused.  Leftcalf: no swelling, no erythema. Non tender to palpation. No cords palpated.  Psych alert and oriented normal speech thought process and affect.  Lab and Radiology Results Lab Results  Component Value Date   ALT 38 (H) 11/30/2017   AST 38 (H) 11/30/2017   ALKPHOS 109 12/30/2016   BILITOT 0.5 11/30/2017    Previous LFTs reviewed.   Depression screen [facility] 2/9 01/04/2018 11/17/2017 11/03/2017 08/14/2017 01/29/2017  Decreased Interest 0 1 3 1  0  Down, Depressed, Hopeless 0 1 2 0 0  PHQ - 2 Score 0 2 5 1  0  Altered sleeping 0 2 2 3  -  Tired, decreased energy 1 2 3 3  -  Change in appetite 0 2 3 1  -  Feeling bad or failure about yourself  0 0 2 1 -  Trouble concentrating 0 0 1 0 -  Moving slowly or fidgety/restless 0 0 0 0 -  Suicidal thoughts 0 0 0 0 -  PHQ-9 Score 1 8 16 9  -  Difficult doing work/chores Not difficult at all Not difficult at all  Very difficult Not difficult at all -     Assessment and Plan: 52 y.o. female with follow up leg swelling and depression. Her legs swelling has completely resolved with home exercises and increased mobility. She was advised to continue the increased activity to prevent swelling in the future.   We briefly discussed her mild transaminitis noted on her LFTs. We discussed that the elevation is mild and we will monitor closely with no intervention at this time.   Her depression is doing very well. Her PHQ9 score today was 1. She does not complain of any depressive symptoms and is asking reduce her Lexapro dose. We will reduce Lexapro to 10 mg today.    No orders of the defined types were placed in this encounter.  Meds ordered this encounter  Medications  . escitalopram (LEXAPRO) 10 MG tablet    Sig: Take 1 tablet (10 mg total) by mouth daily.    Dispense:  90 tablet    Refill:  1     Historical information moved to improve visibility of documentation.  Past Medical History:  Diagnosis Date  . AK (actinic keratosis) 07/14/2017   Right upper chest wall  . Anemia   . Anemia, iron deficiency 05/02/2015  . Fibromyalgia   . History of gastric bypass 05/09/2016  .  Hypertension   . Insomnia 08/14/2017  . Malabsorption of iron 05/09/2016  . PONV (postoperative nausea and vomiting)   . Surgical menopause 03/24/2016   Past Surgical History:  Procedure Laterality Date  . ABDOMINAL HYSTERECTOMY  1997  . APPENDECTOMY    . CESAREAN SECTION  [id number]  . CHOLECYSTECTOMY    . GASTRIC BYPASS  2011  . KNEE ARTHROSCOPY  2002  . TONSILLECTOMY  1977  . ULNAR NERVE TRANSPOSITION Right 06/12/2015   Procedure: RIGHT CUBITAL TUNNEL RELEASE;  Surgeon: Milly Jakob, MD;  Location: [facility];  Service: Orthopedics;  Laterality: Right;   Social History   Tobacco Use  . Smoking status: Never Smoker  . Smokeless tobacco: Never Used  Substance Use Topics  . Alcohol use: No   family  history includes Breast cancer in her mother; Diabetes in her father; Heart failure in her father; Hypertension in her father; Stroke in her father.  Medications: Current Outpatient Medications  Medication Sig Dispense Refill  . AMBULATORY NON FORMULARY MEDICATION One Touch Glucometer per insurance formulary. One month of testing strips, and lancets used to check blood sugar daily. 1 Units prn  . AMBULATORY NON FORMULARY MEDICATION Continuous glucose monitor for profound hypoglycemia.  Dexcom system if able. E16.2 1 each 0  . blood glucose meter kit and supplies KIT Check fasting blood sugar daily. 1 each 0  . escitalopram (LEXAPRO) 10 MG tablet Take 1 tablet (10 mg total) by mouth daily. 90 tablet 1  . glucagon 1 MG injection Inject 1 mg into the muscle once as needed. For low blood sugar 1 each 12  . glucose blood (IGLUCOSE TEST STRIPS) test strip Check fasting blood sugar 3-4 times daily. Dx: E16.2 100 each 11  . Lancets 30G MISC Check fasting blood sugar daily 50 each PRN  . magnesium oxide (MAG-OX) 400 MG tablet Take 2 tablets (800 mg total) by mouth at bedtime. 90 tablet 3  . zolpidem (AMBIEN) 5 MG tablet Take 1 tablet (5 mg total) by mouth at bedtime as needed for sleep. 30 tablet 3   No current facility-administered medications for this visit.    Allergies  Allergen Reactions  . Mobic [Meloxicam] Swelling  . Shellfish Allergy Nausea Only and Swelling  . Lyrica [Pregabalin] Other (See Comments)    "zombie"  . Percocet [Oxycodone-Acetaminophen] Itching     Discussed warning signs or symptoms. Please see discharge instructions. Patient expresses understanding.  I personally was present and performed or re-performed the history, physical exam and medical decision-making activities of this service and have verified that the service and findings are accurately documented in the student's note. ___________________________________________ Lynne Leader M.D., ABFM., CAQSM. Primary Care  and Sports Medicine Adjunct Instructor of [employer]

## 2018-01-04 NOTE — Patient Instructions (Signed)
Thank you for coming in today. Continue lower carb diet.  Continue current treatment.  Reduce lexapro to 10mg  daily.  Recheck in 6 months or sooner if needed. Let me know when you get your flu shot.

## 2018-01-11 ENCOUNTER — Ambulatory Visit: Payer: Self-pay | Admitting: Family Medicine

## 2018-01-13 MED FILL — ZOLPIDEM TARTRATE 5 MG TAB: 5 | 30 days supply | Qty: 30 | Fill #2

## 2018-02-10 ENCOUNTER — Telehealth: Payer: No Typology Code available for payment source | Admitting: Nurse Practitioner

## 2018-02-10 DIAGNOSIS — J0100 Acute maxillary sinusitis, unspecified: Secondary | ICD-10-CM

## 2018-02-10 MED ORDER — AMOXICILLIN-POT CLAVULANATE 875-125 MG PO TABS: 1.0000 | ORAL_TABLET | Freq: Two times a day (BID) | ORAL | 0 refills | Status: DC

## 2018-02-10 NOTE — Progress Notes (Signed)

## 2018-02-17 MED FILL — ZOLPIDEM TARTRATE 5 MG TAB: 5 | 30 days supply | Qty: 30 | Fill #3

## 2018-03-04 ENCOUNTER — Ambulatory Visit: Payer: Self-pay

## 2018-03-08 ENCOUNTER — Encounter: Payer: Self-pay | Admitting: Family Medicine

## 2018-03-08 MED ORDER — CYCLOBENZAPRINE HCL 10 MG PO TABS: 10.0000 mg | ORAL_TABLET | Freq: Three times a day (TID) | ORAL | 0 refills | Status: DC | PRN

## 2018-03-10 ENCOUNTER — Telehealth: Payer: Self-pay

## 2018-03-10 ENCOUNTER — Ambulatory Visit (INDEPENDENT_AMBULATORY_CARE_PROVIDER_SITE_OTHER): Payer: No Typology Code available for payment source | Admitting: Family Medicine

## 2018-03-10 ENCOUNTER — Other Ambulatory Visit: Payer: Self-pay

## 2018-03-10 DIAGNOSIS — Z23 Encounter for immunization: Secondary | ICD-10-CM | POA: Diagnosis not present

## 2018-03-10 NOTE — Telephone Encounter (Signed)
Pt wanting to get Shingles vaccine since she had Shingles earlier this year.   Pt states she just turned 28 and knows she is young, but since she has already had shingles she wants to go ahead with the shot.   OK to add this pt to the Shingles wait list?

## 2018-03-11 NOTE — Telephone Encounter (Signed)
done

## 2018-03-11 NOTE — Telephone Encounter (Signed)
Please add to waiting list and inform patient

## 2018-04-16 IMAGING — DX DG WRIST COMPLETE 3+V*L*
4 series · 4 of 4 positions shown · non-contrast
Comparison: None.

CLINICAL DATA: Lateral wrist pain following fall, initial encounter

EXAM:
LEFT WRIST - COMPLETE 3+ VIEW

[wrist pa]
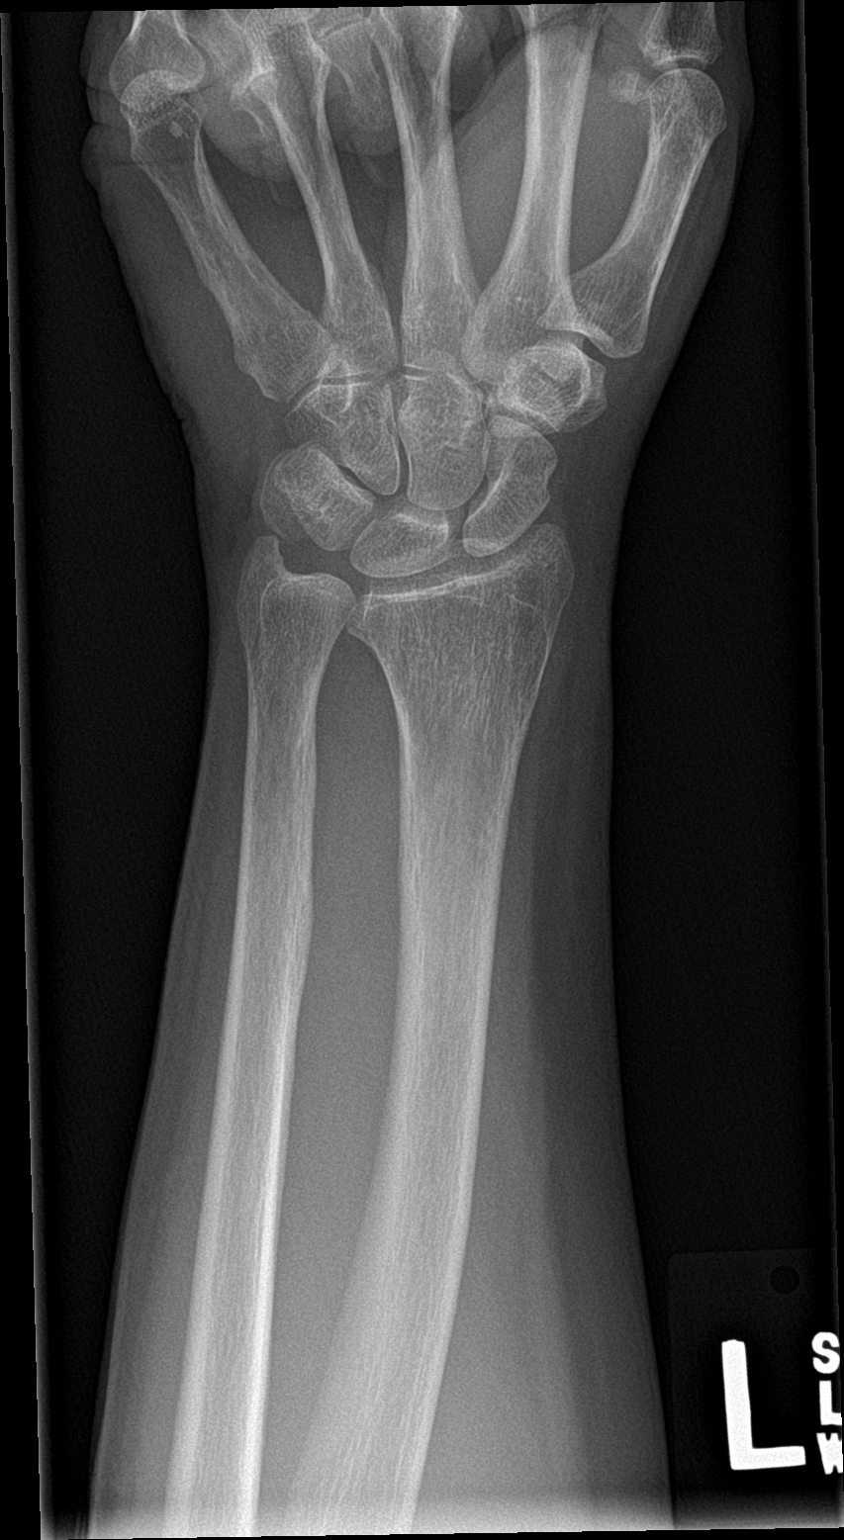

[wrist obl]
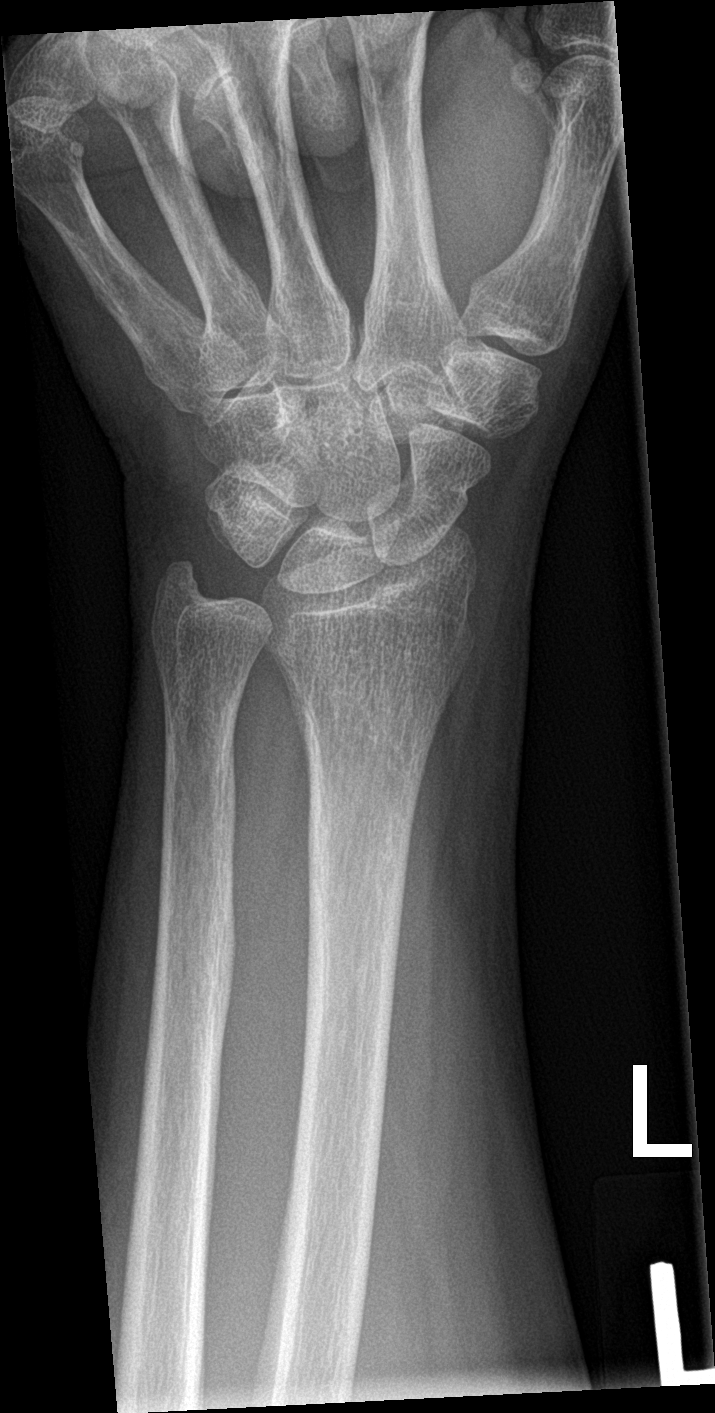

[wrist lat]
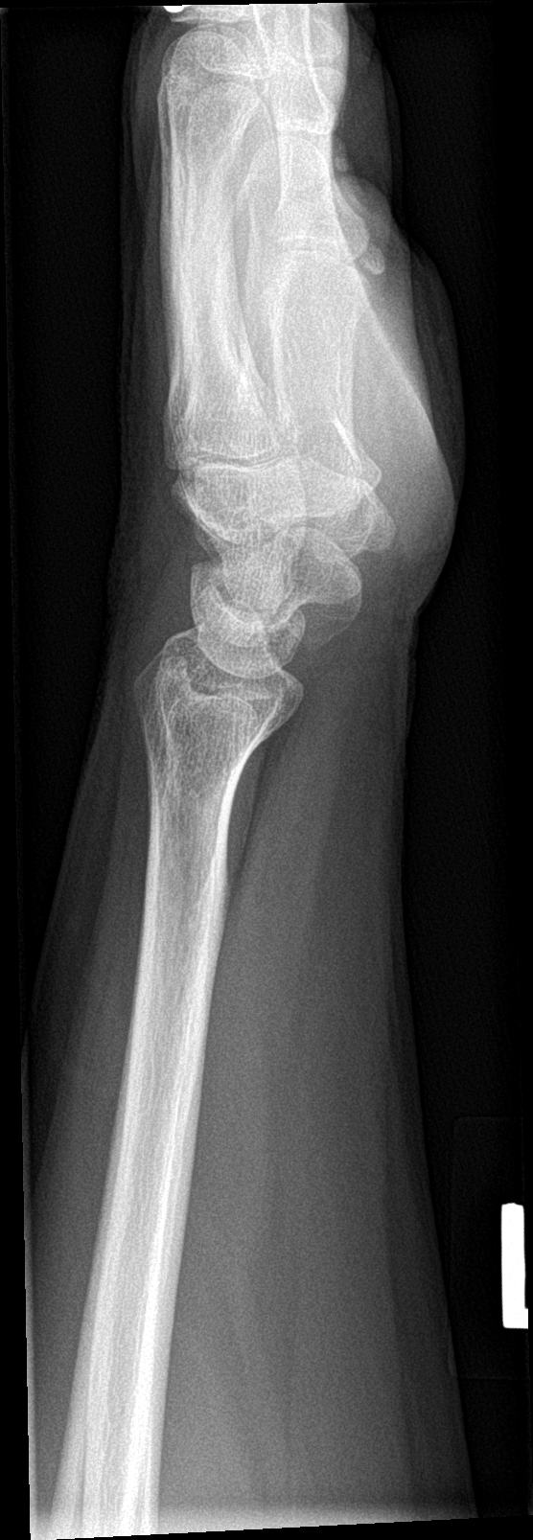

[wrist navicular]
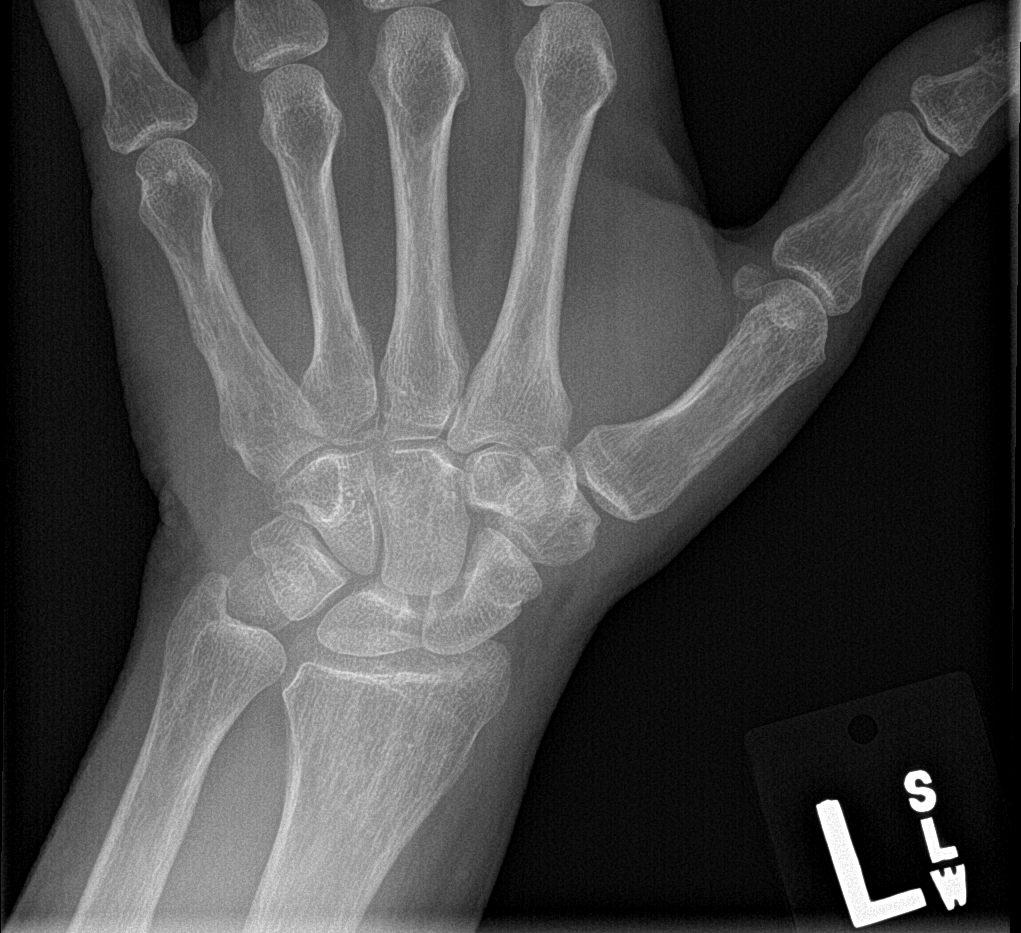

[4 of 4 positions shown; findings below may reference images not displayed]

FINDINGS: There is no evidence of fracture or dislocation. There is no
evidence of arthropathy or other focal bone abnormality. Soft
tissues are unremarkable.
IMPRESSION: No acute abnormality.

## 2018-04-16 IMAGING — DX DG KNEE COMPLETE 4+V*L*
4 series · 4 of 4 positions shown · non-contrast
Comparison: No recent prior .

CLINICAL DATA: Fall.

EXAM:
LEFT KNEE - COMPLETE 4+ VIEW

[knee ap]
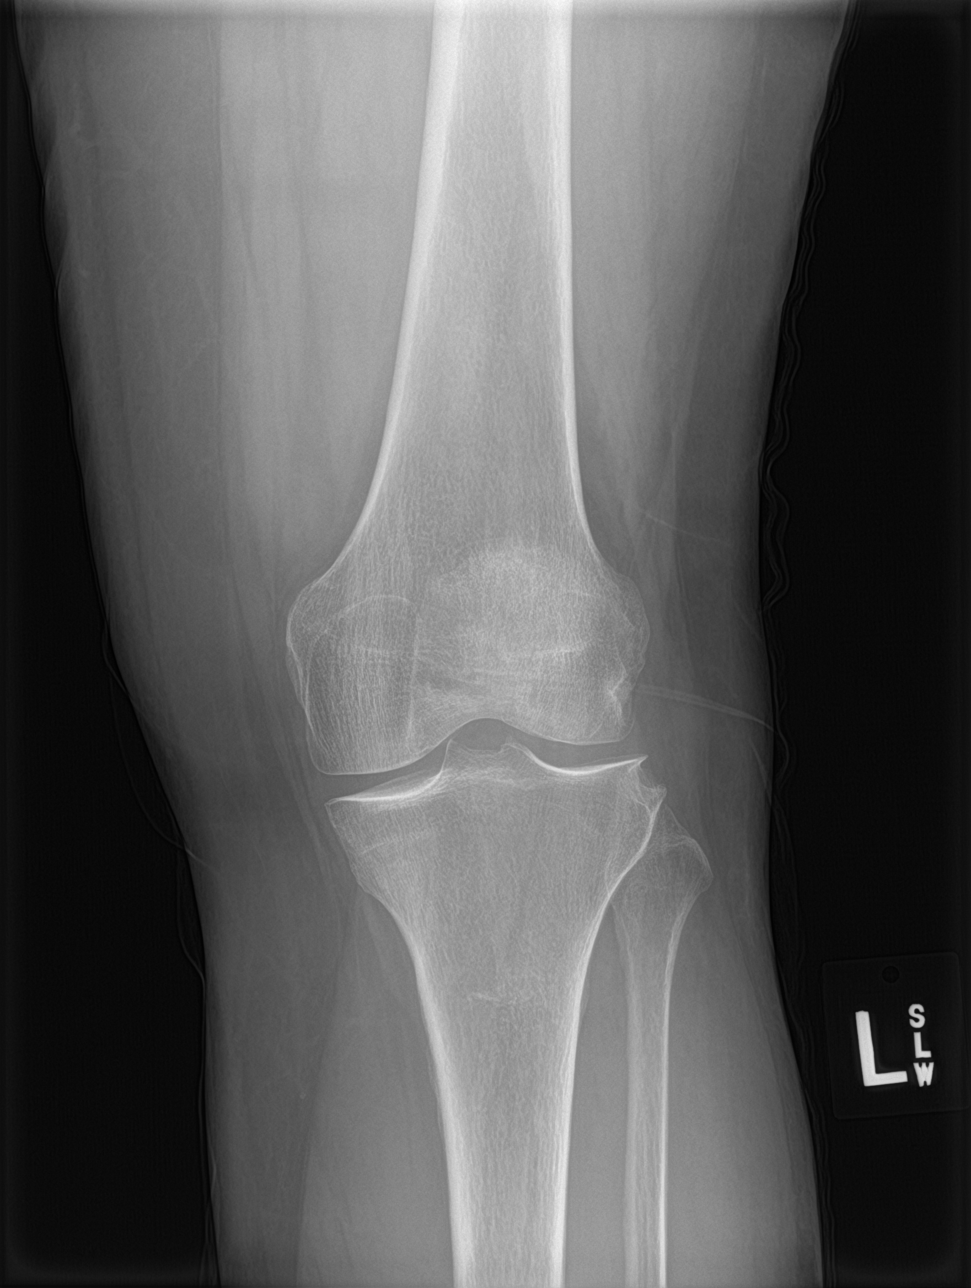

[knee lat]
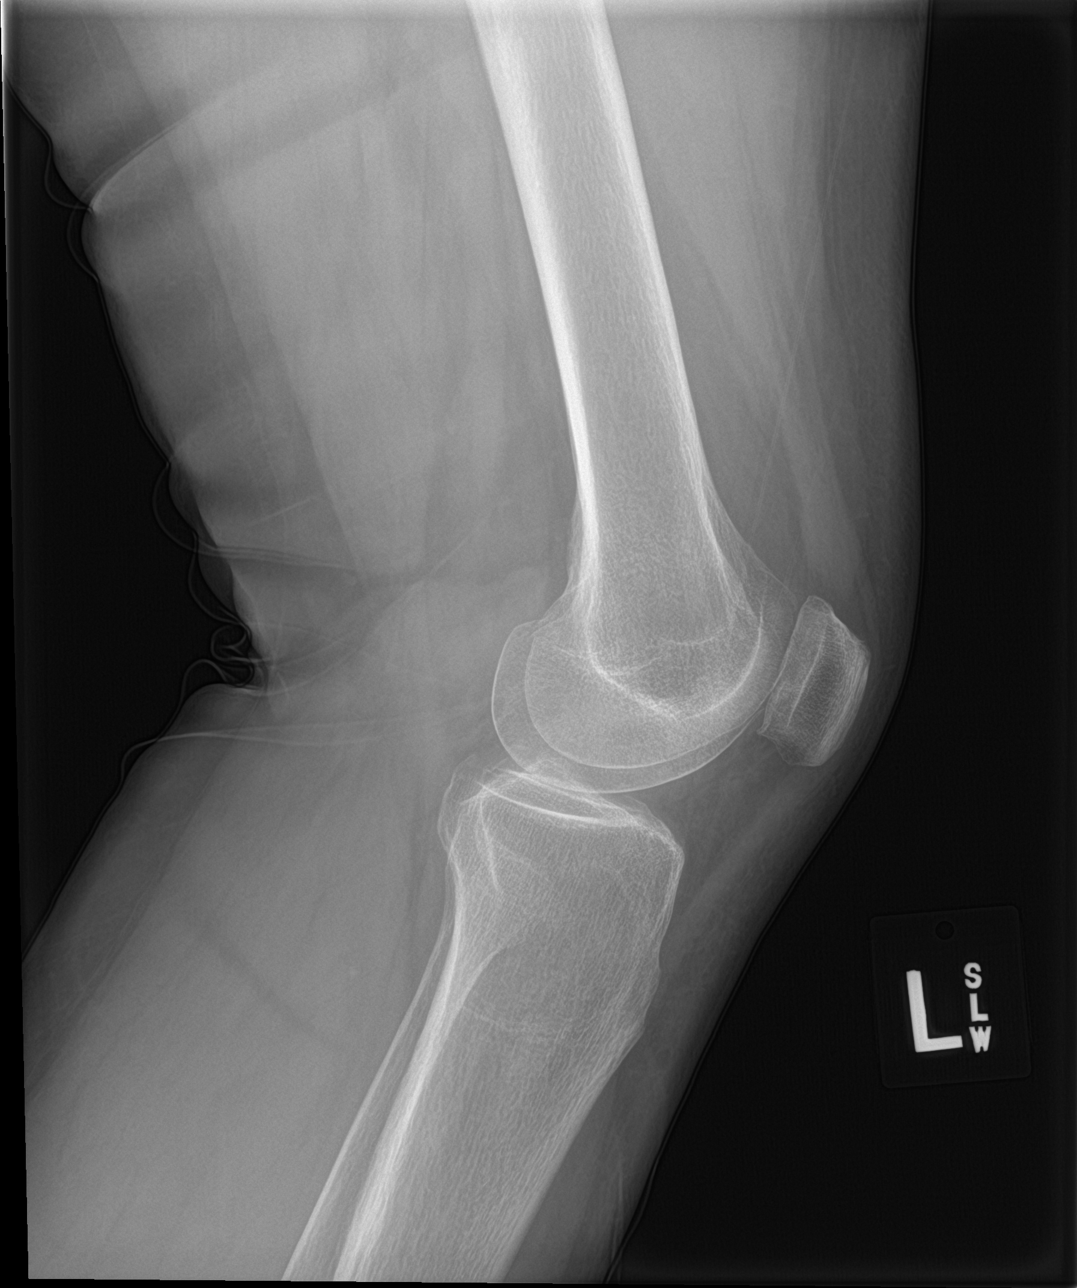

[knee obl (1 of 2)]
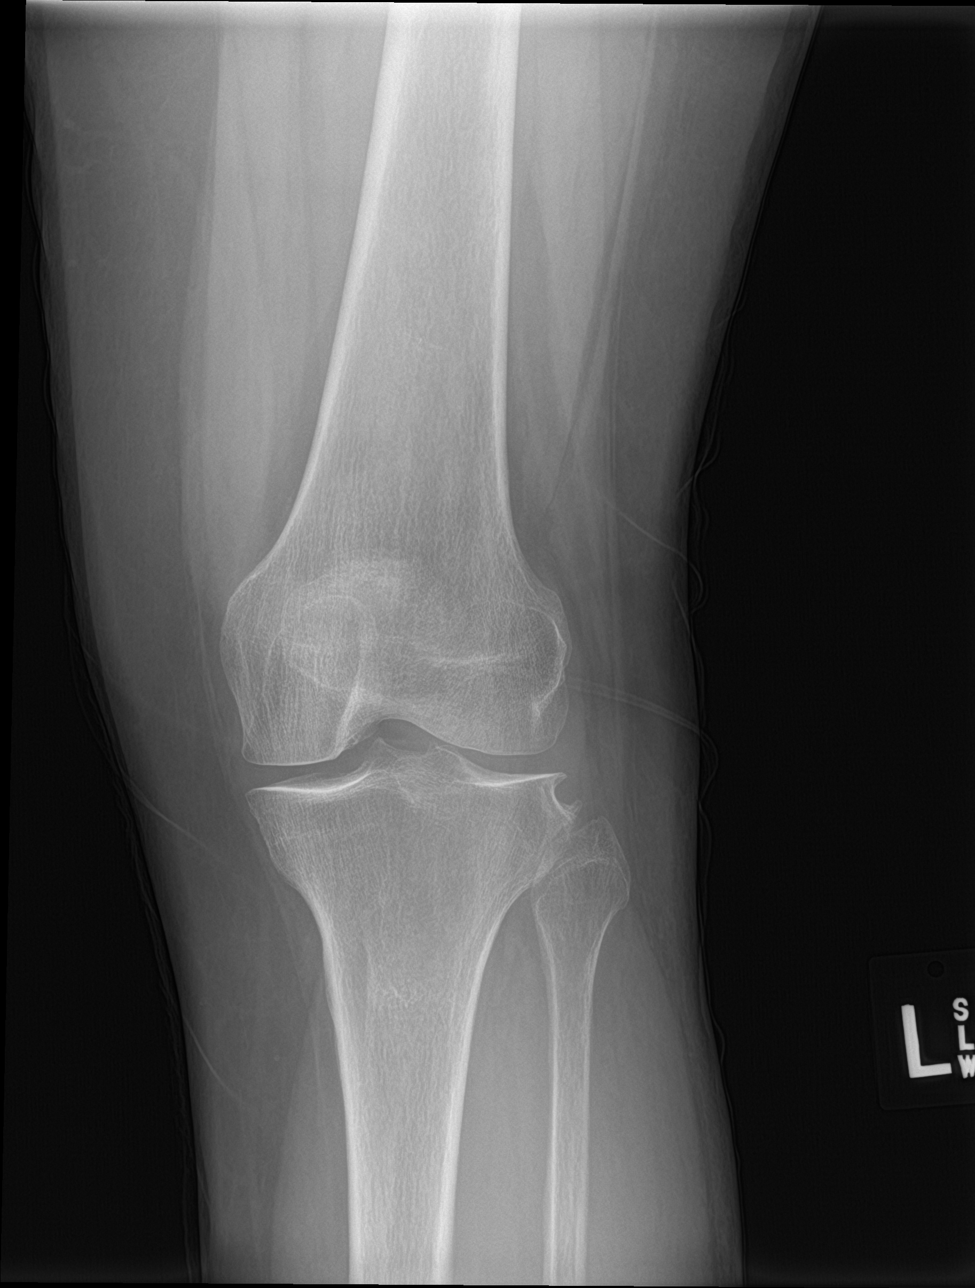

[knee obl (2 of 2)]
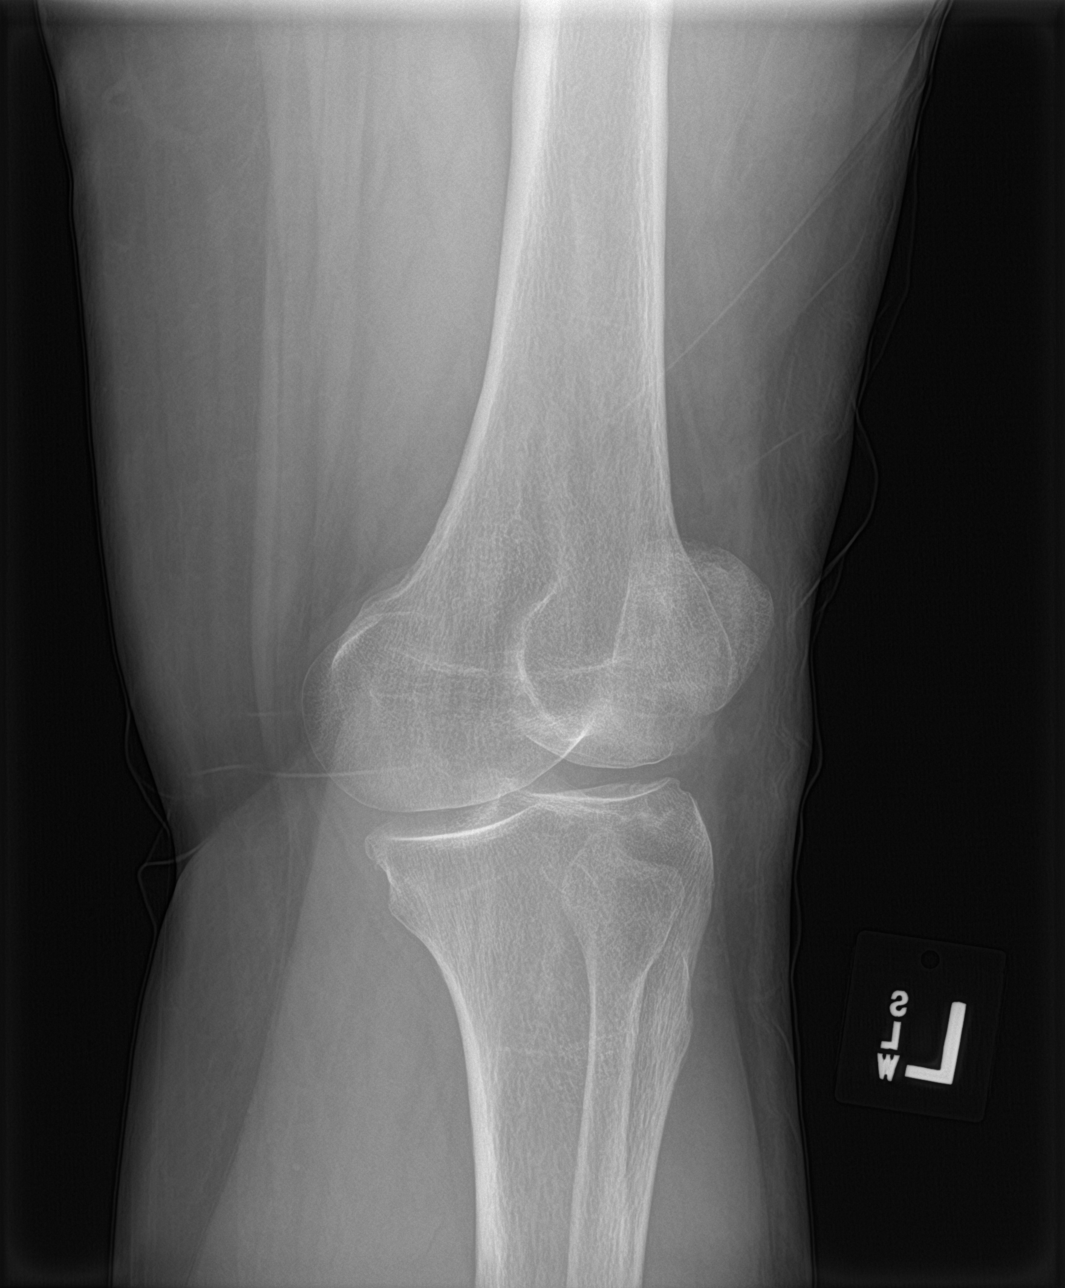

[4 of 4 positions shown; findings below may reference images not displayed]

FINDINGS: Mild tricompartment degenerative change. No acute bony or joint
abnormality identified. No evidence of fracture or dislocation.
Small knee joint effusion.
IMPRESSION: Mild tricompartment degenerative change. No acute abnormality
identified. Small knee joint effusion.

## 2018-04-19 ENCOUNTER — Ambulatory Visit (INDEPENDENT_AMBULATORY_CARE_PROVIDER_SITE_OTHER): Payer: No Typology Code available for payment source | Admitting: Family Medicine

## 2018-04-19 VITALS — BP 118/78 | HR 64 | Temp 98.9°F | Wt 187.0 lb

## 2018-04-19 DIAGNOSIS — Z23 Encounter for immunization: Secondary | ICD-10-CM

## 2018-04-19 MED ORDER — ZOLPIDEM TARTRATE 5 MG PO TABS: 5.0000 mg | ORAL_TABLET | Freq: Every evening | ORAL | 3 refills | Status: DC | PRN

## 2018-04-19 MED FILL — ZOLPIDEM TARTRATE 5 MG TAB: 5 | 30 days supply | Qty: 30 | Fill #0

## 2018-04-19 NOTE — Progress Notes (Signed)
Pt in today for her first shingrix. Shingrix vaccine given in left deltoid. Pt tolerated well without complications. Pt advised to return in 2 to 6 months for Shingrix #2. Pt stated she had a physical scheduled in January. Pt requested a refill of her zolpidem to be sent to [facility], advised I would sent to provider to refill. [facility] printed rx , shredded and electronic version sent to provider.

## 2018-04-20 MED FILL — ESCITALOPRAM 10 MG TABLET: 10 | 90 days supply | Qty: 90 | Fill #1

## 2018-05-12 ENCOUNTER — Other Ambulatory Visit: Payer: Self-pay | Admitting: Family Medicine

## 2018-05-12 DIAGNOSIS — Z1231 Encounter for screening mammogram for malignant neoplasm of breast: Secondary | ICD-10-CM

## 2018-05-24 ENCOUNTER — Telehealth: Payer: No Typology Code available for payment source | Admitting: Family

## 2018-05-24 DIAGNOSIS — J208 Acute bronchitis due to other specified organisms: Secondary | ICD-10-CM | POA: Diagnosis not present

## 2018-05-24 DIAGNOSIS — J029 Acute pharyngitis, unspecified: Secondary | ICD-10-CM | POA: Diagnosis not present

## 2018-05-24 MED ORDER — BENZONATATE 100 MG PO CAPS: 100.0000 mg | ORAL_CAPSULE | Freq: Three times a day (TID) | ORAL | 0 refills | Status: DC | PRN

## 2018-05-24 NOTE — Progress Notes (Signed)
Thank you for the details you included in the comment boxes. Those details are very helpful in determining the best course of treatment for you and help [place] to provide the best care.  We are sorry that you are not feeling well.  Here is how we plan to help!  Based on your presentation I believe you most likely have A cough due to a virus.  This is called viral bronchitis and is best treated by rest, plenty of fluids and control of the cough.  You may use Ibuprofen or Tylenol as directed to help your symptoms.     In addition you may use A non-prescription cough medication called Mucinex DM: take 2 tablets every 12 hours. and A prescription cough medication called Tessalon Perles 100mg . You may take 1-2 capsules every 8 hours as needed for your cough.   From your responses in the eVisit questionnaire you describe inflammation in the upper respiratory tract which is causing a significant cough.  This is commonly called Bronchitis and has four common causes:    Allergies  Viral Infections  Acid Reflux  Bacterial Infection Allergies, viruses and acid reflux are treated by controlling symptoms or eliminating the cause. An example might be a cough caused by taking certain blood pressure medications. You stop the cough by changing the medication. Another example might be a cough caused by acid reflux. Controlling the reflux helps control the cough.  USE OF BRONCHODILATOR ("RESCUE") INHALERS: There is a risk from using your bronchodilator too frequently.  The risk is that over-reliance on a medication which only relaxes the muscles surrounding the breathing tubes can reduce the effectiveness of medications prescribed to reduce swelling and congestion of the tubes themselves.  Although you feel brief relief from the bronchodilator inhaler, your asthma may actually be worsening with the tubes becoming more swollen and filled with mucus.  This can delay other crucial treatments, such as oral steroid  medications. If you need to use a bronchodilator inhaler daily, several times per day, you should discuss this with your provider.  There are probably better treatments that could be used to keep your asthma under control.     HOME CARE . Only take medications as instructed by your medical team. . Complete the entire course of an antibiotic. . Drink plenty of fluids and get plenty of rest. . Avoid close contacts especially the very young and the elderly . Cover your mouth if you cough or cough into your sleeve. . Always remember to wash your hands . A steam or ultrasonic humidifier can help congestion.   GET HELP RIGHT AWAY IF: . You develop worsening fever. . You become short of breath . You cough up blood. . Your symptoms persist after you have completed your treatment plan MAKE SURE YOU   Understand these instructions.  Will watch your condition.  Will get help right away if you are not doing well or get worse.  Your e-visit answers were reviewed by a board certified advanced clinical practitioner to complete your personal care plan.  Depending on the condition, your plan could have included both over the counter or prescription medications. If there is a problem please reply  once you have received a response from your provider. Your safety is important to [place].  If you have drug allergies check your prescription carefully.    You can use MyChart to ask questions about today's visit, request a non-urgent call back, or ask for a work or school excuse for  24 hours related to this e-Visit. If it has been greater than 24 hours you will need to follow up with your provider, or enter a new e-Visit to address those concerns. You will get an e-mail in the next two days asking about your experience.  I hope that your e-visit has been valuable and will speed your recovery. Thank you for using e-visits.

## 2018-05-26 ENCOUNTER — Ambulatory Visit (INDEPENDENT_AMBULATORY_CARE_PROVIDER_SITE_OTHER): Payer: No Typology Code available for payment source

## 2018-05-26 DIAGNOSIS — Z1231 Encounter for screening mammogram for malignant neoplasm of breast: Secondary | ICD-10-CM

## 2018-05-26 MED FILL — ZOLPIDEM TARTRATE 5 MG TAB: 5 | 30 days supply | Qty: 30 | Fill #1

## 2018-05-27 ENCOUNTER — Encounter: Payer: Self-pay | Admitting: Osteopathic Medicine

## 2018-05-27 ENCOUNTER — Ambulatory Visit (INDEPENDENT_AMBULATORY_CARE_PROVIDER_SITE_OTHER): Payer: No Typology Code available for payment source | Admitting: Osteopathic Medicine

## 2018-05-27 VITALS — BP 131/96 | HR 64 | Temp 98.4°F | Wt 189.2 lb

## 2018-05-27 DIAGNOSIS — B9689 Other specified bacterial agents as the cause of diseases classified elsewhere: Secondary | ICD-10-CM

## 2018-05-27 DIAGNOSIS — J329 Chronic sinusitis, unspecified: Secondary | ICD-10-CM

## 2018-05-27 MED ORDER — IPRATROPIUM BROMIDE 0.06 % NA SOLN: 2.0000 | Freq: Four times a day (QID) | NASAL | 1 refills | Status: DC

## 2018-05-27 MED ORDER — AMOXICILLIN-POT CLAVULANATE 875-125 MG PO TABS: 1.0000 | ORAL_TABLET | Freq: Two times a day (BID) | ORAL | 0 refills | Status: DC

## 2018-05-27 MED FILL — AMOX-CLAV 875-125 MG TABLET: 875-125 | 7 days supply | Qty: 14 | Fill #0

## 2018-05-27 MED FILL — IPRATROPIUM 0.06% SPRAY: 0.06 | 10 days supply | Qty: 15 | Fill #0

## 2018-05-27 NOTE — Progress Notes (Signed)
HPI: Stacy Woods is a 52 y.o. female who  has a past medical history of AK (actinic keratosis) (07/14/2017), Anemia, Anemia, iron deficiency (05/02/2015), Fibromyalgia, History of gastric bypass (05/09/2016), Hypertension, Insomnia (08/14/2017), Malabsorption of iron (05/09/2016), PONV (postoperative nausea and vomiting), and Surgical menopause (03/24/2016).  she presents to [facility] today, 05/27/18,  for chief complaint of: Sick, cough   E-visit 4 days ago for three days of cough/cold symptoms - viral bronchitis dx, rx for Tessalon, advised Tylenol/Motrin + Mucinex DM  At this time R ear pain x2 days Scratchy throat Cough thinks postnasal drip      Past medical, surgical, social and family history reviewed and updated as necessary.   Current medication list and allergy/intolerance information reviewed:    Current Outpatient Medications  Medication Sig Dispense Refill  . AMBULATORY NON FORMULARY MEDICATION One Touch Glucometer per insurance formulary. One month of testing strips, and lancets used to check blood sugar daily. 1 Units prn  . AMBULATORY NON FORMULARY MEDICATION Continuous glucose monitor for profound hypoglycemia.  Dexcom system if able. E16.2 1 each 0  . benzonatate (TESSALON PERLES) 100 MG capsule Take 1-2 capsules (100-200 mg total) by mouth every 8 (eight) hours as needed for cough. 30 capsule 0  . blood glucose meter kit and supplies KIT Check fasting blood sugar daily. 1 each 0  . cyclobenzaprine (FLEXERIL) 10 MG tablet Take 1 tablet (10 mg total) by mouth 3 (three) times daily as needed for muscle spasms. 30 tablet 0  . escitalopram (LEXAPRO) 10 MG tablet Take 1 tablet (10 mg total) by mouth daily. 90 tablet 1  . glucagon 1 MG injection Inject 1 mg into the muscle once as needed. For low blood sugar 1 each 12  . glucose blood (IGLUCOSE TEST STRIPS) test strip Check fasting blood sugar 3-4 times daily. Dx: E16.2 100 each 11  .  Lancets 30G MISC Check fasting blood sugar daily 50 each PRN  . magnesium oxide (MAG-OX) 400 MG tablet Take 2 tablets (800 mg total) by mouth at bedtime. 90 tablet 3  . zolpidem (AMBIEN) 5 MG tablet Take 1 tablet (5 mg total) by mouth at bedtime as needed for sleep. 30 tablet 3  . zolpidem (AMBIEN) 5 MG tablet Take 1 tablet (5 mg total) by mouth at bedtime as needed for sleep. 30 tablet 3   No current facility-administered medications for this visit.     Allergies  Allergen Reactions  . Mobic [Meloxicam] Swelling  . Shellfish Allergy Nausea Only and Swelling  . Lyrica [Pregabalin] Other (See Comments)    "zombie"  . Percocet [Oxycodone-Acetaminophen] Itching       Review of Systems:  Constitutional:  +low-grade fever, no chills, No recent illness, No unintentional weight changes. +significant fatigue.   HEENT: +headache, no vision change, no hearing change, +sore throat, +sinus pressure  Cardiac: No  chest pain, No  pressure, No palpitations  Respiratory:  No  shortness of breath. +Cough  Gastrointestinal: No  abdominal pain, No  nausea, No  vomiting,  No  blood in stool, No  diarrhea   Musculoskeletal: No new myalgia/arthralgia  Skin: No  Rash  Neurologic: No  weakness, No  dizziness  Exam:  BP (!) 131/96 (BP Location: Left Arm, Patient Position: Sitting, Cuff Size: Normal)   Pulse 64   Temp 98.4 F (36.9 C) (Oral)   Wt 189 lb 3.2 oz (85.8 kg)   BMI 32.46 kg/m   Constitutional: VS see  above. General Appearance: alert, well-developed, well-nourished, NAD  Eyes: Normal lids and conjunctive, non-icteric sclera  Ears, Nose, Mouth, Throat: MMM, Normal external inspection ears/nares/mouth/lips/gums. TM normal bilaterally w/ clear effusion on R. Pharynx/tonsils no erythema, no exudate. Nasal mucosa normal.   Neck: No masses, trachea midline. No tenderness/mass appreciated. No enlarged LN but tender anterior cervical on R  Respiratory: Normal respiratory effort. no  wheeze, no rhonchi, no rales  Cardiovascular: S1/S2 normal, no murmur, no rub/gallop auscultated. RRR. No lower extremity edema.   Gastrointestinal: Nontender, no masses. Bowel sounds normal.  Musculoskeletal: Gait normal.   Neurological: Normal balance/coordination. No tremor.   Skin: warm, dry, intact.   Psychiatric: Normal judgment/insight. Normal mood and affect.        ASSESSMENT/PLAN: The encounter diagnosis was Bacterial sinusitis.   Meds ordered this encounter  Medications  . amoxicillin-clavulanate (AUGMENTIN) 875-125 MG tablet    Sig: Take 1 tablet by mouth 2 (two) times daily.    Dispense:  14 tablet    Refill:  0  . ipratropium (ATROVENT) 0.06 % nasal spray    Sig: Place 2 sprays into both nostrils 4 (four) times daily.    Dispense:  15 mL    Refill:  1     Patient Instructions  Medications & Home Remedies for Upper Respiratory Illness   Note: the following list assumes no pregnancy, normal liver & kidney function and no other drug interactions. Dr. Sheppard Coil has highlighted medications which are safe for you to use, but these may not be appropriate for everyone. Always ask a pharmacist or qualified medical provider if you have any questions!    Aches/Pains, Fever, Headache OTC Acetaminophen (Tylenol) 500 mg tablets - take max 2 tablets (1000 mg) every 6 hours (4 times per day)  OTC Ibuprofen (Motrin) 200 mg tablets - take max 4 tablets (800 mg) every 6 hours*   Sinus Congestion Prescription Atrovent as directed OTC Nasal Saline if desired to rinse OTC Oxymetolazone (Afrin, others) sparing use due to rebound congestion, NEVER use in kids OTC Phenylephrine (Sudafed) 10 mg tablets every 4 hours (or the 12-hour formulation)* OTC Diphenhydramine (Benadryl) 25 mg tablets - take max 2 tablets every 4 hours   Cough & Sore Throat Prescription cough pills or syrups as directed OTC Dextromethorphan (Robitussin, others) - cough suppressant OTC Guaifenesin  (Robitussin, Mucinex, others) - expectorant (helps cough up mucus) (Dextromethorphan and Guaifenesin also come in a combination tablet/syrup) OTC Lozenges w/ Benzocaine + Menthol (Cepacol) Honey - as much as you want! Teas which "coat the throat" - look for ingredients Elm Bark, Licorice Root, Marshmallow Root   Other Prescription Antibiotics if these are necessary for bacterial infection - take ALL, even if you're feeling better  OTC Zinc Lozenges within 24 hours of symptoms onset - mixed evidence this shortens the duration of the common cold Don't waste your money on Vitamin C or Echinacea in acute illness - it's already too late!    *Caution in patients with high blood pressure          Visit summary with medication list and pertinent instructions was printed for patient to review. All questions at time of visit were answered - patient instructed to contact office with any additional concerns or updates. ER/RTC precautions were reviewed with the patient.   Follow-up plan: Return if symptoms worsen or fail to improve, for routine care per PCP as directed.   Please note: voice recognition software was used to produce this document, and typos may  escape review. Please contact Dr. Sheppard Coil for any needed clarifications.

## 2018-05-27 NOTE — Patient Instructions (Signed)
Medications & Home Remedies for Upper Respiratory Illness   Note: the following list assumes no pregnancy, normal liver & kidney function and no other drug interactions. Dr. Sheppard Coil has highlighted medications which are safe for you to use, but these may not be appropriate for everyone. Always ask a pharmacist or qualified medical provider if you have any questions!    Aches/Pains, Fever, Headache OTC Acetaminophen (Tylenol) 500 mg tablets - take max 2 tablets (1000 mg) every 6 hours (4 times per day)  OTC Ibuprofen (Motrin) 200 mg tablets - take max 4 tablets (800 mg) every 6 hours*   Sinus Congestion Prescription Atrovent as directed OTC Nasal Saline if desired to rinse OTC Oxymetolazone (Afrin, others) sparing use due to rebound congestion, NEVER use in kids OTC Phenylephrine (Sudafed) 10 mg tablets every 4 hours (or the 12-hour formulation)* OTC Diphenhydramine (Benadryl) 25 mg tablets - take max 2 tablets every 4 hours   Cough & Sore Throat Prescription cough pills or syrups as directed OTC Dextromethorphan (Robitussin, others) - cough suppressant OTC Guaifenesin (Robitussin, Mucinex, others) - expectorant (helps cough up mucus) (Dextromethorphan and Guaifenesin also come in a combination tablet/syrup) OTC Lozenges w/ Benzocaine + Menthol (Cepacol) Honey - as much as you want! Teas which "coat the throat" - look for ingredients Elm Bark, Licorice Root, Marshmallow Root   Other Prescription Antibiotics if these are necessary for bacterial infection - take ALL, even if you're feeling better  OTC Zinc Lozenges within 24 hours of symptoms onset - mixed evidence this shortens the duration of the common cold Don't waste your money on Vitamin C or Echinacea in acute illness - it's already too late!    *Caution in patients with high blood pressure

## 2018-06-23 MED FILL — ZOLPIDEM TARTRATE 5 MG TAB: 5 | 30 days supply | Qty: 30 | Fill #2

## 2018-07-05 ENCOUNTER — Ambulatory Visit (INDEPENDENT_AMBULATORY_CARE_PROVIDER_SITE_OTHER): Payer: No Typology Code available for payment source | Admitting: Family Medicine

## 2018-07-05 ENCOUNTER — Encounter: Payer: Self-pay | Admitting: Gastroenterology

## 2018-07-05 ENCOUNTER — Encounter: Payer: Self-pay | Admitting: Family Medicine

## 2018-07-05 VITALS — BP 129/94 | HR 72 | Ht 64.0 in | Wt 192.0 lb

## 2018-07-05 DIAGNOSIS — R7401 Elevation of levels of liver transaminase levels: Secondary | ICD-10-CM

## 2018-07-05 DIAGNOSIS — Z23 Encounter for immunization: Secondary | ICD-10-CM

## 2018-07-05 DIAGNOSIS — I10 Essential (primary) hypertension: Secondary | ICD-10-CM

## 2018-07-05 DIAGNOSIS — R740 Nonspecific elevation of levels of transaminase and lactic acid dehydrogenase [LDH]: Secondary | ICD-10-CM

## 2018-07-05 DIAGNOSIS — Z1211 Encounter for screening for malignant neoplasm of colon: Secondary | ICD-10-CM | POA: Diagnosis not present

## 2018-07-05 DIAGNOSIS — R7303 Prediabetes: Secondary | ICD-10-CM | POA: Diagnosis not present

## 2018-07-05 DIAGNOSIS — D509 Iron deficiency anemia, unspecified: Secondary | ICD-10-CM

## 2018-07-05 DIAGNOSIS — F3340 Major depressive disorder, recurrent, in remission, unspecified: Secondary | ICD-10-CM

## 2018-07-05 DIAGNOSIS — E538 Deficiency of other specified B group vitamins: Secondary | ICD-10-CM

## 2018-07-05 NOTE — Progress Notes (Signed)
Stacy Woods is a 53 y.o. female who presents to [facility]: [facility] today for follow-up mood, transaminitis, prediabetes, iron deficiency and B12 deficiency.  Stacy Woods has had some mild transaminitis previously on labs.  She feels well without any abdominal or chills.  Additionally she has a history of prediabetes and episodes of hypoglycemic episodes.  She is been working on a low carbohydrate diet and feels well.  She has not had many episodes since.  Additionally she has a history of gastric bypass and iron deficiency and B12 deficiency.  She feels well  She notes that she is due for colon cancer screening.  She wishes to proceed with colonoscopy.  She has a history of depression and takes Lexapro below.  She notes her mood is well controlled. ROS as above:  Exam:  BP (!) 129/94   Pulse 72   Ht 5' 4"  (1.626 m)   Wt 192 lb (87.1 kg)   BMI 32.96 kg/m  Wt Readings from Last 5 Encounters:  07/05/18 192 lb (87.1 kg)  05/27/18 189 lb 3.2 oz (85.8 kg)  04/19/18 187 lb (84.8 kg)  01/04/18 187 lb (84.8 kg)  11/30/17 187 lb (84.8 kg)    Gen: Well NAD HEENT: EOMI,  MMM Lungs: Normal work of breathing. CTABL Heart: RRR no MRG Abd: NABS, Soft. Nondistended, Nontender Exts: Brisk capillary refill, warm and well perfused.  Psych alert and oriented normal speech thought process and affect.  Depression screen [facility] 2/9 07/05/2018 01/04/2018 11/17/2017 11/03/2017 08/14/2017  Decreased Interest 0 0 1 3 1   Down, Depressed, Hopeless 0 0 1 2 0  PHQ - 2 Score 0 0 2 5 1   Altered sleeping 1 0 2 2 3   Tired, decreased energy 0 1 2 3 3   Change in appetite 1 0 2 3 1   Feeling bad or failure about yourself  0 0 0 2 1  Trouble concentrating 0 0 0 1 0  Moving slowly or fidgety/restless 0 0 0 0 0  Suicidal thoughts 0 0 0 0 0  PHQ-9 Score 2 1 8 16 9   Difficult doing work/chores Not difficult at  all Not difficult at all Not difficult at all Very difficult Not difficult at all    Lab and Radiology Results No results found for this or any previous visit (from the past 72 hour(s)). No results found.    Assessment and Plan: 53 y.o. female with  Transaminitis: Recheck metabolic panel.  Recheck A1c and iron stores B12 deficiency for prediabetes and iron deficiency B12 deficiency following gastric bypass.  Refer to gastroenterology for colon cancer screening for colonoscopy.  Administer second Shingrix vaccine.  Recheck in 3 months for well adult visit.   Orders Placed This Encounter  Procedures  . Varicella-zoster vaccine IM (Shingrix)  . COMPLETE METABOLIC PANEL WITH GFR  . CBC  . Lipid Panel w/reflex Direct LDL  . Hemoglobin A1c  . Iron, TIBC and Ferritin Panel  . Vitamin B12  . Ambulatory referral to Gastroenterology    Referral Priority:   Routine    Referral Type:   Consultation    Referral Reason:   Specialty Services Required    Number of Visits Requested:   1   No orders of the defined types were placed in this encounter.    Historical information moved to improve visibility of documentation.  Past Medical History:  Diagnosis Date  . AK (actinic keratosis) 07/14/2017  Right upper chest wall  . Anemia   . Anemia, iron deficiency 05/02/2015  . Fibromyalgia   . History of gastric bypass 05/09/2016  . Hypertension   . Insomnia 08/14/2017  . Malabsorption of iron 05/09/2016  . PONV (postoperative nausea and vomiting)   . Surgical menopause 03/24/2016   Past Surgical History:  Procedure Laterality Date  . ABDOMINAL HYSTERECTOMY  1997  . APPENDECTOMY    . CESAREAN SECTION  [id number]  . CHOLECYSTECTOMY    . GASTRIC BYPASS  2011  . KNEE ARTHROSCOPY  2002  . TONSILLECTOMY  1977  . ULNAR NERVE TRANSPOSITION Right 06/12/2015   Procedure: RIGHT CUBITAL TUNNEL RELEASE;  Surgeon: Milly Jakob, MD;  Location: [place];  Service: Orthopedics;   Laterality: Right;   Social History   Tobacco Use  . Smoking status: Never Smoker  . Smokeless tobacco: Never Used  Substance Use Topics  . Alcohol use: No   family history includes Breast cancer in her mother; Diabetes in her father; Heart failure in her father; Hypertension in her father; Stroke in her father.  Medications: Current Outpatient Medications  Medication Sig Dispense Refill  . AMBULATORY NON FORMULARY MEDICATION One Touch Glucometer per insurance formulary. One month of testing strips, and lancets used to check blood sugar daily. 1 Units prn  . blood glucose meter kit and supplies KIT Check fasting blood sugar daily. 1 each 0  . cyclobenzaprine (FLEXERIL) 10 MG tablet Take 1 tablet (10 mg total) by mouth 3 (three) times daily as needed for muscle spasms. 30 tablet 0  . escitalopram (LEXAPRO) 10 MG tablet Take 1 tablet (10 mg total) by mouth daily. 90 tablet 1  . glucagon 1 MG injection Inject 1 mg into the muscle once as needed. For low blood sugar 1 each 12  . glucose blood (IGLUCOSE TEST STRIPS) test strip Check fasting blood sugar 3-4 times daily. Dx: E16.2 100 each 11  . Lancets 30G MISC Check fasting blood sugar daily 50 each PRN  . magnesium oxide (MAG-OX) 400 MG tablet Take 2 tablets (800 mg total) by mouth at bedtime. 90 tablet 3  . zolpidem (AMBIEN) 5 MG tablet Take 1 tablet (5 mg total) by mouth at bedtime as needed for sleep. 30 tablet 3   No current facility-administered medications for this visit.    Allergies  Allergen Reactions  . Mobic [Meloxicam] Swelling  . Shellfish Allergy Nausea Only and Swelling  . Lyrica [Pregabalin] Other (See Comments)    "zombie"  . Percocet [Oxycodone-Acetaminophen] Itching     Discussed warning signs or symptoms. Please see discharge instructions. Patient expresses understanding.

## 2018-07-05 NOTE — Patient Instructions (Signed)
Thank you for coming in today. You should hear about colon cancer screening initial appointment with Gastroenterology.   Recheck with me for physical in 3 month.   Return sooner if needed.   Get fasting labs today.

## 2018-07-06 ENCOUNTER — Encounter: Payer: Self-pay | Admitting: Family Medicine

## 2018-07-06 LAB — CBC
HCT: 45.1 % — ABNORMAL HIGH (ref 35.0–45.0)
HEMOGLOBIN: 15.2 g/dL (ref 11.7–15.5)
MCH: 30.6 pg (ref 27.0–33.0)
MCHC: 33.7 g/dL (ref 32.0–36.0)
MCV: 90.7 fL (ref 80.0–100.0)
MPV: 11.2 fL (ref 7.5–12.5)
Platelets: 340 10*3/uL (ref 140–400)
RBC: 4.97 10*6/uL (ref 3.80–5.10)
RDW: 13.1 % (ref 11.0–15.0)
WBC: 4.8 10*3/uL (ref 3.8–10.8)

## 2018-07-06 LAB — HEMOGLOBIN A1C
Hgb A1c MFr Bld: 5.3 % of total Hgb (ref <5.7)
Mean Plasma Glucose: 105 (calc)
eAG (mmol/L): 5.8 (calc)

## 2018-07-06 LAB — COMPLETE METABOLIC PANEL WITH GFR
AG Ratio: 1.7 (calc) (ref 1.0–2.5)
ALT: 27 U/L (ref 6–29)
AST: 27 U/L (ref 10–35)
Albumin: 4.7 g/dL (ref 3.6–5.1)
Alkaline phosphatase (APISO): 128 U/L (ref 33–130)
BILIRUBIN TOTAL: 0.6 mg/dL (ref 0.2–1.2)
BUN: 14 mg/dL (ref 7–25)
CHLORIDE: 109 mmol/L (ref 98–110)
CO2: 24 mmol/L (ref 20–32)
Calcium: 9.7 mg/dL (ref 8.6–10.4)
Creat: 0.82 mg/dL (ref 0.50–1.05)
GFR, EST AFRICAN AMERICAN: 97 mL/min/{1.73_m2} (ref >=60)
GFR, Est Non African American: 83 mL/min/{1.73_m2} (ref >=60)
GLUCOSE: 87 mg/dL (ref 65–99)
Globulin: 2.8 g/dL (calc) (ref 1.9–3.7)
Potassium: 4.2 mmol/L (ref 3.5–5.3)
Sodium: 141 mmol/L (ref 135–146)
TOTAL PROTEIN: 7.5 g/dL (ref 6.1–8.1)

## 2018-07-06 LAB — VITAMIN B12: Vitamin B-12: 245 pg/mL (ref 200–1100)

## 2018-07-06 LAB — IRON,TIBC AND FERRITIN PANEL
%SAT: 18 % (calc) (ref 16–45)
Ferritin: 18 ng/mL (ref 16–232)
Iron: 72 ug/dL (ref 45–160)
TIBC: 398 ug/dL (ref 250–450)

## 2018-07-06 LAB — LIPID PANEL W/REFLEX DIRECT LDL
Cholesterol: 221 mg/dL — ABNORMAL HIGH (ref <200)
HDL: 46 mg/dL — ABNORMAL LOW (ref >50)
LDL Cholesterol (Calc): 154 mg/dL (calc) — ABNORMAL HIGH
Non-HDL Cholesterol (Calc): 175 mg/dL (calc) — ABNORMAL HIGH (ref <130)
Total CHOL/HDL Ratio: 4.8 (calc) (ref <5.0)
Triglycerides: 101 mg/dL (ref <150)

## 2018-07-08 MED ORDER — B-12 100-5000 MCG SL SUBL: 1.0000 | SUBLINGUAL_TABLET | Freq: Every day | SUBLINGUAL | 3 refills | Status: DC

## 2018-07-09 ENCOUNTER — Telehealth: Payer: Self-pay

## 2018-07-09 NOTE — Telephone Encounter (Signed)
[facility] called and state they cannot get the Cobalamin combinations B 12 [phone] mcg. They asked if it could be changed to 5000 mcg and she can get the OTC with her [employer] Sp[employer] Account.

## 2018-07-09 NOTE — Telephone Encounter (Signed)
Okay to change? 

## 2018-07-09 NOTE — Telephone Encounter (Signed)
Pharmacy advised  

## 2018-07-15 ENCOUNTER — Ambulatory Visit: Payer: No Typology Code available for payment source

## 2018-07-15 ENCOUNTER — Encounter: Payer: Self-pay | Admitting: Gastroenterology

## 2018-07-15 VITALS — Ht 64.0 in | Wt 196.0 lb

## 2018-07-15 DIAGNOSIS — Z1211 Encounter for screening for malignant neoplasm of colon: Secondary | ICD-10-CM

## 2018-07-15 MED ORDER — NA SULFATE-K SULFATE-MG SULF 17.5-3.13-1.6 GM/177ML PO SOLN: 1.0000 | Freq: Once | ORAL | 0 refills | Status: AC

## 2018-07-15 NOTE — Progress Notes (Signed)
No egg or soy allergy known to patient  No issues with past sedation with any surgeries  or procedures, no intubation problems. N and V related to anesthesia No diet pills per patient No home 02 use per patient  No blood thinners per patient  Pt denies issues with constipation  No A fib or A flutter  EMMI video sent to pt's e mail the patient declined

## 2018-07-16 ENCOUNTER — Encounter: Payer: Self-pay | Admitting: Family Medicine

## 2018-07-18 ENCOUNTER — Telehealth: Payer: No Typology Code available for payment source | Admitting: Physician Assistant

## 2018-07-18 DIAGNOSIS — J069 Acute upper respiratory infection, unspecified: Secondary | ICD-10-CM

## 2018-07-18 MED ORDER — BENZONATATE 100 MG PO CAPS: 100.0000 mg | ORAL_CAPSULE | Freq: Three times a day (TID) | ORAL | 0 refills | Status: DC | PRN

## 2018-07-18 NOTE — Progress Notes (Signed)
We are sorry you are not feeling well.  Here is how we plan to help!  Based on what you have shared with me, it looks like you may have a viral upper respiratory infection or a "common cold".  Colds are caused by a large number of viruses; however, rhinovirus is the most common cause.   Symptoms of the common cold vary from person to person, with common symptoms including sore throat, cough, and malaise.  A low-grade fever of 100.4 may present, but is often uncommon.  Symptoms vary however, and are closely related to a person's age or underlying illnesses.  The most common symptoms associated with the common cold are nasal discharge or congestion, cough, sneezing, headache and pressure in the ears and face.  Cold symptoms usually persist for about 3 to 10 days, but can last up to 2 weeks.  It is important to know that colds do not cause serious illness or complications in most cases.    The common cold is transmitted from person to person, with the most common method of transmission being a person's hands.  The virus is able to live on the skin and can infect other persons for up to 2 hours after direct contact.  Also, colds are transmitted when someone coughs or sneezes; thus, it is important to cover the mouth to reduce this risk.  To keep the spread of the common cold at [place], good hand hygiene is very important.  This is an infection that is most likely caused by a virus. There are no specific treatments for the common cold other than to help you with the symptoms until the infection runs its course.    For nasal congestion, you may use an oral decongestants such as Mucinex D or if you have glaucoma or high blood pressure use plain Mucinex.  Saline nasal spray or nasal drops can help and can safely be used as often as needed for congestion.    If you do not have a history of heart disease, hypertension, diabetes or thyroid disease, prostate/bladder issues or glaucoma, you may also use Sudafed to treat  nasal congestion.  It is highly recommended that you consult with a pharmacist or your primary care physician to ensure this medication is safe for you to take.     If you have a cough, you may use cough suppressants such as Delsym and Robitussin.  If you have glaucoma or high blood pressure, you can also use Coricidin HBP.   For cough I have prescribed for you A prescription cough medication called Tessalon Perles 100 mg. You may take 1-2 capsules every 8 hours as needed for cough  If you have a sore or scratchy throat, use a saltwater gargle-  to  teaspoon of salt dissolved in a 4-ounce to 8-ounce glass of warm water.  Gargle the solution for approximately 15-30 seconds and then spit.  It is important not to swallow the solution.  You can also use throat lozenges/cough drops and Chloraseptic spray to help with throat pain or discomfort.  Warm or cold liquids can also be helpful in relieving throat pain.  For headache, pain or general discomfort, you can use Ibuprofen or Tylenol as directed.   Some authorities believe that zinc sprays or the use of Echinacea may shorten the course of your symptoms.   HOME CARE . Only take medications as instructed by your medical team. . Be sure to drink plenty of fluids. Water is fine as well as  fruit juices, sodas and electrolyte beverages. You may want to stay away from caffeine or alcohol. If you are nauseated, try taking small sips of liquids. How do you know if you are getting enough fluid? Your urine should be a pale yellow or almost colorless. . Get rest. . Taking a steamy shower or using a humidifier may help nasal congestion and ease sore throat pain. You can place a towel over your head and breathe in the steam from hot water coming from a faucet. . Using a saline nasal spray works much the same way. . Cough drops, hard candies and sore throat lozenges may ease your cough. . Avoid close contacts especially the very young and the elderly . Cover your  mouth if you cough or sneeze . Always remember to wash your hands.   GET HELP RIGHT AWAY IF: . You develop worsening fever. . If your symptoms do not improve within 10 days . You develop yellow or green discharge from your nose over 3 days. . You have coughing fits . You develop a severe head ache or visual changes. . You develop shortness of breath, difficulty breathing or start having chest pain . Your symptoms persist after you have completed your treatment plan  MAKE SURE YOU   Understand these instructions.  Will watch your condition.  Will get help right away if you are not doing well or get worse.  Your e-visit answers were reviewed by a board certified advanced clinical practitioner to complete your personal care plan. Depending upon the condition, your plan could have included both over the counter or prescription medications. Please review your pharmacy choice. If there is a problem, you may call our nursing hot line at and have the prescription routed to another pharmacy. Your safety is important to [place]. If you have drug allergies check your prescription carefully.   You can use MyChart to ask questions about today's visit, request a non-urgent call back, or ask for a work or school excuse for 24 hours related to this e-Visit. If it has been greater than 24 hours you will need to follow up with your provider, or enter a new e-Visit to address those concerns. You will get an e-mail in the next two days asking about your experience.  I hope that your e-visit has been valuable and will speed your recovery. Thank you for using e-visits.     2

## 2018-07-22 ENCOUNTER — Ambulatory Visit (INDEPENDENT_AMBULATORY_CARE_PROVIDER_SITE_OTHER): Payer: No Typology Code available for payment source | Admitting: Family Medicine

## 2018-07-22 ENCOUNTER — Encounter: Payer: Self-pay | Admitting: Family Medicine

## 2018-07-22 ENCOUNTER — Ambulatory Visit (INDEPENDENT_AMBULATORY_CARE_PROVIDER_SITE_OTHER): Payer: No Typology Code available for payment source

## 2018-07-22 VITALS — BP 125/87 | HR 81 | Ht 64.0 in | Wt 197.0 lb

## 2018-07-22 DIAGNOSIS — R05 Cough: Secondary | ICD-10-CM

## 2018-07-22 DIAGNOSIS — R059 Cough, unspecified: Secondary | ICD-10-CM

## 2018-07-22 DIAGNOSIS — T792XXA Traumatic secondary and recurrent hemorrhage and seroma, initial encounter: Secondary | ICD-10-CM

## 2018-07-22 DIAGNOSIS — M25531 Pain in right wrist: Secondary | ICD-10-CM | POA: Diagnosis not present

## 2018-07-22 MED ORDER — AZITHROMYCIN 250 MG PO TABS: 250.0000 mg | ORAL_TABLET | Freq: Every day | ORAL | 0 refills | Status: DC

## 2018-07-22 NOTE — Patient Instructions (Addendum)
Thank you for coming in today. I think this is a seroma.  It should get better.  Use compression  Use motion  Exercise with modeling clay pinch and push Let me know if not improving.   For the cough and congestion.  If worse fill and take the antibiotic.   Could consider oral steroid like prednisone or inhaled steroid like Symbicort inhaler.    Seroma A seroma is a collection of fluid on the body that looks like swelling or a mass. Seromas form where tissue has been injured or cut. Seromas vary in size. Some are small and painless. Others may become large and cause pain or discomfort. Many seromas go away on their own as the fluid is naturally absorbed by the body, and some seromas need to be drained. What are the causes? Seromas form as the result of damage to tissue or the removal of tissue. This tissue damage may occur during surgery or because of an injury or trauma. When tissue is disrupted or removed, empty space is created. The body's natural defense system (immune system) causes fluid to enter the empty space and form a seroma. What are the signs or symptoms? Symptoms of this condition include:  Swelling at the site of a surgical cut (incision) or an injury.  Drainage of clear fluid at the surgery or injury site.  Discomfort or pain. How is this diagnosed? This condition is diagnosed based on your symptoms, your medical history, and a physical exam. During the exam, your health care provider will press on the seroma. You may also have tests, including:  Blood tests.  Imaging tests, such as an ultrasound or CT scan. How is this treated? Some seromas go away (resolve) on their own. Your health care provider may monitor you to make sure the seroma does not cause any complications. If your seroma does not resolve on its own, treatment may include:  Using a needle to drain the fluid from the seroma (needle aspiration).  Inserting a flexible tube (catheter) to drain the  fluid.  Applying a bandage (dressing), such as an elastic bandage or binder.  Antibiotic medicines, if the seroma becomes infected. In rare cases, surgery may be done to remove the seroma and repair the area. Follow these instructions at home:   If you were prescribed an antibiotic medicine, take it as told by your health care provider. Do not stop taking the antibiotic even if you start to feel better.  Return to your normal activities as told by your health care provider. Ask your health care provider what activities are safe for you.  Take over-the-counter and prescription medicines only as told by your health care provider.  Check your seroma every day for signs of infection. Check for: ? Redness or pain. ? Fluid or pus. ? More swelling. ? Warmth.  Keep all follow-up visits as told by your health care provider. This is important. Contact a health care provider if:  You have a fever.  You have redness or pain at the site of the seroma.  You have fluid or pus coming from the seroma.  Your seroma is more swollen or is getting bigger.  Your seroma is warm to the touch. This information is not intended to replace advice given to you by your health care provider. Make sure you discuss any questions you have with your health care provider. Document Released: 09/20/2012 Document Revised: 03/07/2016 Document Reviewed: 03/07/2016 Elsevier Interactive Patient Education  2019 [employer].
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
## 2018-07-27 MED FILL — ZOLPIDEM TARTRATE 5 MG TAB: 5 | 30 days supply | Qty: 30 | Fill #3

## 2018-07-29 ENCOUNTER — Encounter: Payer: Self-pay | Admitting: Gastroenterology

## 2018-07-29 ENCOUNTER — Ambulatory Visit (AMBULATORY_SURGERY_CENTER): Payer: No Typology Code available for payment source | Admitting: Gastroenterology

## 2018-07-29 VITALS — BP 90/44 | HR 52 | Temp 97.3°F | Resp 18 | Ht 64.0 in | Wt 197.0 lb

## 2018-07-29 DIAGNOSIS — Z1211 Encounter for screening for malignant neoplasm of colon: Secondary | ICD-10-CM

## 2018-07-29 MED ORDER — SODIUM CHLORIDE 0.9 % IV SOLN: 500.0000 mL | Freq: Once | INTRAVENOUS | Status: DC

## 2018-07-29 NOTE — Patient Instructions (Signed)
YOU HAD AN ENDOSCOPIC PROCEDURE TODAY AT THE [facility]:   Refer to the procedure report that was given to you for any specific questions about what was found during the examination.  If the procedure report does not answer your questions, please call your gastroenterologist to clarify.  If you requested that your care partner not be given the details of your procedure findings, then the procedure report has been included in a sealed envelope for you to review at your convenience later.  YOU SHOULD EXPECT: Some feelings of bloating in the abdomen. Passage of more gas than usual.  Walking can help get rid of the air that was put into your GI tract during the procedure and reduce the bloating. If you had a lower endoscopy (such as a colonoscopy or flexible sigmoidoscopy) you may notice spotting of blood in your stool or on the toilet paper. If you underwent a bowel prep for your procedure, you may not have a normal bowel movement for a few days.  Please Note:  You might notice some irritation and congestion in your nose or some drainage.  This is from the oxygen used during your procedure.  There is no need for concern and it should clear up in a day or so.  SYMPTOMS TO REPORT IMMEDIATELY:   Following lower endoscopy (colonoscopy or flexible sigmoidoscopy):  Excessive amounts of blood in the stool  Significant tenderness or worsening of abdominal pains  Swelling of the abdomen that is new, acute  Fever of 100F or higher  For urgent or emergent issues, a gastroenterologist can be reached at any hour by calling [phone].   DIET:  We do recommend a small meal at first, but then you may proceed to your regular diet.  Drink plenty of fluids but you should avoid alcoholic beverages for 24 hours.  ACTIVITY:  You should plan to take it easy for the rest of today and you should NOT DRIVE or use heavy machinery until tomorrow (because of the sedation medicines used during the test).     FOLLOW UP: Our staff will call the number listed on your records the next business day following your procedure to check on you and address any questions or concerns that you may have regarding the information given to you following your procedure. If we do not reach you, we will leave a message.  However, if you are feeling well and you are not experiencing any problems, there is no need to return our call.  We will assume that you have returned to your regular daily activities without incident.  If any biopsies were taken you will be contacted by phone or by letter within the next 1-3 weeks.  Please call us at [phone] if you have not heard about the biopsies in 3 weeks.    SIGNATURES/CONFIDENTIALITY: You and/or your care partner have signed paperwork which will be entered into your electronic medical record.  These signatures attest to the fact that that the information above on your After Visit Summary has been reviewed and is understood.  Full responsibility of the confidentiality of this discharge information lies with you and/or your care-partner. 

## 2018-07-29 NOTE — Op Note (Signed)
[facility] Patient Name: Stacy Woods Procedure Date: 07/29/2018 9:23 AM MRN: [id number] Endoscopist: Thornton Park MD, MD Age: 53 Referring MD:  Date of Birth: 12-27-1965 Gender: Female Account #: [id number] Procedure:                Colonoscopy Indications:              Screening for colorectal malignant neoplasm, This                            is the patient's first colonoscopy. No known family                            history of colon cancer or polyps. No baseline GI                            symptoms. Medicines:                See the Anesthesia note for documentation of the                            administered medications Procedure:                Pre-Anesthesia Assessment:                           - Prior to the procedure, a History and Physical                            was performed, and patient medications and                            allergies were reviewed. The patient's tolerance of                            previous anesthesia was also reviewed. The risks                            and benefits of the procedure and the sedation                            options and risks were discussed with the patient.                            All questions were answered, and informed consent                            was obtained. Prior Anticoagulants: The patient has                            taken no previous anticoagulant or antiplatelet                            agents. ASA Grade Assessment: II - A patient with  mild systemic disease. After reviewing the risks                            and benefits, the patient was deemed in                            satisfactory condition to undergo the procedure.                           After obtaining informed consent, the colonoscope                            was passed under direct vision. Throughout the                            procedure, the patient's blood pressure, pulse, and                             oxygen saturations were monitored continuously. The                            Colonoscope was introduced through the anus and                            advanced to the the terminal ileum, with                            identification of the appendiceal orifice and IC                            valve. The colonoscopy was performed without                            difficulty. The patient tolerated the procedure                            well. The quality of the bowel preparation was                            good. The terminal ileum, ileocecal valve,                            appendiceal orifice, and rectum were photographed. Scope In: 9:40:30 AM Scope Out: 9:56:29 AM Scope Withdrawal Time: 0 hours 11 minutes 49 seconds  Total Procedure Duration: 0 hours 15 minutes 59 seconds  Findings:                 The perianal and digital rectal examinations were                            normal.                           The entire examined colon appeared normal on direct  and retroflexion views. Complications:            No immediate complications. Estimated Blood Loss:     Estimated blood loss: none. Impression:               - The entire examined colon is normal on direct and                            retroflexion views.                           - No specimens collected. Recommendation:           - Patient has a contact number available for                            emergencies. The signs and symptoms of potential                            delayed complications were discussed with the                            patient. Return to normal activities tomorrow.                            Written discharge instructions were provided to the                            patient.                           - Resume regular diet.                           - Continue present medications.                           - Repeat colonoscopy in 10  years for screening                            purposes. Thornton Park MD, MD 07/29/2018 9:59:44 AM This report has been signed electronically.

## 2018-07-29 NOTE — Progress Notes (Signed)
Report given to PACU, vssReport given to PACU, vss 

## 2018-07-29 NOTE — Progress Notes (Signed)
Pt's states no medical or surgical changes since previsit or office visit. 

## 2018-07-30 ENCOUNTER — Telehealth: Payer: Self-pay

## 2018-07-30 NOTE — Telephone Encounter (Signed)
  Follow up Call-  Call back number 07/29/2018  Post procedure Call Back phone  # [phone]  Permission to leave phone message Yes  Some recent data might be hidden     Patient questions:  Do you have a fever, pain , or abdominal swelling? No. Pain Score  0 *  Have you tolerated food without any problems? Yes.    Have you been able to return to your normal activities? Yes.    Do you have any questions about your discharge instructions: Diet   No. Medications  No. Follow up visit  No.  Do you have questions or concerns about your Care? No.  Actions: * If pain score is 4 or above: No action needed, pain <4.

## 2018-08-24 ENCOUNTER — Other Ambulatory Visit: Payer: Self-pay | Admitting: Family Medicine

## 2018-08-24 DIAGNOSIS — F411 Generalized anxiety disorder: Secondary | ICD-10-CM

## 2018-08-24 MED FILL — ZOLPIDEM TARTRATE 5 MG TAB: 5 | 30 days supply | Qty: 30 | Fill #0

## 2018-08-24 MED FILL — ESCITALOPRAM 10 MG TABLET: 10 | 90 days supply | Qty: 90 | Fill #0

## 2018-09-03 ENCOUNTER — Telehealth: Payer: No Typology Code available for payment source | Admitting: Nurse Practitioner

## 2018-09-03 DIAGNOSIS — N3000 Acute cystitis without hematuria: Secondary | ICD-10-CM

## 2018-09-03 MED ORDER — CEPHALEXIN 500 MG PO CAPS: 500.0000 mg | ORAL_CAPSULE | Freq: Two times a day (BID) | ORAL | 0 refills | Status: DC

## 2018-09-03 NOTE — Progress Notes (Signed)
We are sorry that you are not feeling well.  Here is how we plan to help!  Based on what you shared with me it looks like you most likely have a simple urinary tract infection.  A UTI (Urinary Tract Infection) is a bacterial infection of the bladder.  Most cases of urinary tract infections are simple to treat but a key part of your care is to encourage you to drink plenty of fluids and watch your symptoms carefully.  I have prescribed Keflex 500 mg twice a day for 7 days.  Your symptoms should gradually improve. Call us if the burning in your urine worsens, you develop worsening fever, back pain or pelvic pain or if your symptoms do not resolve after completing the antibiotic. * we do not normally treat UTI with back pain because really need urinalysis and urine culture. If  You are not better in 24 hours you will need to see your PCP or go to urgent care.  Urinary tract infections can be prevented by drinking plenty of water to keep your body hydrated.  Also be sure when you wipe, wipe from front to back and don't hold it in!  If possible, empty your bladder every 4 hours.  Your e-visit answers were reviewed by a board certified advanced clinical practitioner to complete your personal care plan.  Depending on the condition, your plan could have included both over the counter or prescription medications.  If there is a problem please reply  once you have received a response from your provider.  Your safety is important to [place].  If you have drug allergies check your prescription carefully.    You can use MyChart to ask questions about today's visit, request a non-urgent call back, or ask for a work or school excuse for 24 hours related to this e-Visit. If it has been greater than 24 hours you will need to follow up with your provider, or enter a new e-Visit to address those concerns.   You will get an e-mail in the next two days asking about your experience.  I hope that your e-visit has been  valuable and will speed your recovery. Thank you for using e-visits.  5 minutes spent reviewing and documenting in chart.

## 2018-09-13 ENCOUNTER — Ambulatory Visit: Payer: Self-pay | Admitting: Family Medicine

## 2018-09-13 ENCOUNTER — Ambulatory Visit (INDEPENDENT_AMBULATORY_CARE_PROVIDER_SITE_OTHER): Payer: No Typology Code available for payment source | Admitting: Family Medicine

## 2018-09-13 ENCOUNTER — Encounter: Payer: Self-pay | Admitting: Family Medicine

## 2018-09-13 ENCOUNTER — Other Ambulatory Visit: Payer: Self-pay

## 2018-09-13 VITALS — BP 146/98 | HR 73 | Ht 64.0 in | Wt 196.0 lb

## 2018-09-13 DIAGNOSIS — Z113 Encounter for screening for infections with a predominantly sexual mode of transmission: Secondary | ICD-10-CM | POA: Diagnosis not present

## 2018-09-13 DIAGNOSIS — R300 Dysuria: Secondary | ICD-10-CM

## 2018-09-13 DIAGNOSIS — N898 Other specified noninflammatory disorders of vagina: Secondary | ICD-10-CM | POA: Diagnosis not present

## 2018-09-13 DIAGNOSIS — A599 Trichomoniasis, unspecified: Secondary | ICD-10-CM | POA: Diagnosis not present

## 2018-09-13 LAB — POCT URINALYSIS DIPSTICK
Bilirubin, UA: NEGATIVE
Blood, UA: NEGATIVE
Glucose, UA: NEGATIVE
Ketones, UA: NEGATIVE
Nitrite, UA: NEGATIVE
Protein, UA: NEGATIVE
Spec Grav, UA: 1.025 (ref 1.010–1.025)
Urobilinogen, UA: 0.2 E.U./dL
pH, UA: 6 (ref 5.0–8.0)

## 2018-09-13 LAB — WET PREP FOR TRICH, YEAST, CLUE
MICRO NUMBER:: 377685
Specimen Quality: ADEQUATE

## 2018-09-13 MED ORDER — METRONIDAZOLE 500 MG PO TABS: 500.0000 mg | ORAL_TABLET | Freq: Two times a day (BID) | ORAL | 0 refills | Status: AC

## 2018-09-13 NOTE — Progress Notes (Signed)
Stacy Woods is a 53 y.o. female who presents to [facility] today for vaginal discharge.   Vaginal discharge. Starting 2-3 weeks ago after exposure to antibiotics for dental infection. Patient was also given keflex 57m bid for UTI symptoms on 3/27. She finished the treatment a few days ago and notes that she still has urinary frequency and urgency.  She denies fevers chills nausea vomiting diarrhea chest pain palpitations shortness of breath.  She no longer is taking any antibiotics.  She has tried some AZO which helped a little.  She denies any other specific treatment.  ROS as above:  Exam:  BP (!) 146/98   Pulse 73   Ht 5' 4"  (1.626 m)   Wt 196 lb (88.9 kg)   BMI 33.64 kg/m   Wt Readings from Last 5 Encounters:  09/13/18 196 lb (88.9 kg)  07/29/18 197 lb (89.4 kg)  07/22/18 197 lb (89.4 kg)  07/15/18 196 lb (88.9 kg)  07/05/18 192 lb (87.1 kg)    Gen: Well NAD HEENT: EOMI,  MMM Lungs: Normal work of breathing. CTABL Heart: RRR no MRG Abd: NABS, Soft. Nondistended, Nontender Exts: Brisk capillary refill, warm and well perfused.  GYN: External genitalia normal appearing. Vaginal canal with thin discharge.     Lab and Radiology Results Results for orders placed or performed in visit on 09/13/18 (from the past 72 hour(s))  POCT Urinalysis Dipstick     Status: Abnormal   Collection Time: 09/13/18  2:41 PM  Result Value Ref Range   Color, UA yellow    Clarity, UA clear    Glucose, UA Negative Negative   Bilirubin, UA negative    Ketones, UA negative    Spec Grav, UA 1.025 1.010 - 1.025   Blood, UA negative    pH, UA 6.0 5.0 - 8.0   Protein, UA Negative Negative   Urobilinogen, UA 0.2 0.2 or 1.0 E.U./dL   Nitrite, UA negative    Leukocytes, UA Small (1+) (A) Negative   Appearance     Odor     No results found.     Assessment and Plan: 53 y.o. female with  Vaginal discharge following exposure to antibiotics.  Concerning for bacterial vaginosis based on appearance of discharge.  Wet prep is pending however.  Empiric treatment with metronidazole.  Additionally patient does have some dysuria symptoms.  Point-of-care urinalysis positive only for small leukocyte esterase.  Plan for urine culture.  PDMP not reviewed this encounter. Orders Placed This Encounter  Procedures  . WET PREP FOR THeath YEAST, CLUE  . Urine Culture  . POCT Urinalysis Dipstick   Meds ordered this encounter  Medications  . metroNIDAZOLE (FLAGYL) 500 MG tablet    Sig: Take 1 tablet (500 mg total) by mouth 2 (two) times daily for 7 days.    Dispense:  14 tablet    Refill:  0     Historical information moved to improve visibility of documentation.  Past Medical History:  Diagnosis Date  . AK (actinic keratosis) 07/14/2017   Right upper chest wall  . Anemia   . Anemia, iron deficiency 05/02/2015  . Fibromyalgia   . History of gastric bypass 05/09/2016  . Hypertension   . Insomnia 08/14/2017  . Malabsorption of iron 05/09/2016  . PONV (postoperative nausea and vomiting)   . Surgical menopause 03/24/2016   Past Surgical History:  Procedure Laterality Date  . ABDOMINAL HYSTERECTOMY  1997  . APPENDECTOMY    . CESAREAN SECTION  [id number]  . CHOLECYSTECTOMY    . GASTRIC BYPASS  2011  . KNEE ARTHROSCOPY  2002  . TONSILLECTOMY  1977  . ULNAR NERVE TRANSPOSITION Right 06/12/2015   Procedure: RIGHT CUBITAL TUNNEL RELEASE;  Surgeon: Milly Jakob, MD;  Location: [facility];  Service: Orthopedics;  Laterality: Right;   Social History   Tobacco Use  . Smoking status: Never Smoker  . Smokeless tobacco: Never Used  Substance Use Topics  . Alcohol use: No   family history includes Breast cancer in her mother; Diabetes in her father; Heart failure in her father; Hypertension in her father; Stroke in her father.  Medications: Current  Outpatient Medications  Medication Sig Dispense Refill  . AMBULATORY NON FORMULARY MEDICATION One Touch Glucometer per insurance formulary. One month of testing strips, and lancets used to check blood sugar daily. 1 Units prn  . blood glucose meter kit and supplies KIT Check fasting blood sugar daily. 1 each 0  . Cobalamin Combinations (B-12) [phone] MCG SUBL Place 1 tablet under the tongue daily. 90 each 3  . Cyanocobalamin (VITAMIN B-12) 1000 MCG SUBL Place under the tongue.    . cyclobenzaprine (FLEXERIL) 10 MG tablet Take 1 tablet (10 mg total) by mouth 3 (three) times daily as needed for muscle spasms. 30 tablet 0  . escitalopram (LEXAPRO) 10 MG tablet TAKE 1 TABLET (10 MG TOTAL) BY MOUTH DAILY. 90 tablet 3  . glucagon 1 MG injection Inject 1 mg into the muscle once as needed. For low blood sugar 1 each 12  . glucose blood (IGLUCOSE TEST STRIPS) test strip Check fasting blood sugar 3-4 times daily. Dx: E16.2 100 each 11  . Lancets 30G MISC Check fasting blood sugar daily 50 each PRN  . magnesium oxide (MAG-OX) 400 MG tablet Take 2 tablets (800 mg total) by mouth at bedtime. 90 tablet 3  . zolpidem (AMBIEN) 5 MG tablet TAKE 1 TABLET (5 MG TOTAL) BY MOUTH AT BEDTIME AS NEEDED FOR SLEEP. 30 tablet 5  . metroNIDAZOLE (FLAGYL) 500 MG tablet Take 1 tablet (500 mg total) by mouth 2 (two) times daily for 7 days. 14 tablet 0   No current facility-administered medications for this visit.    Allergies  Allergen Reactions  . Mobic [Meloxicam] Swelling  . Shellfish Allergy Nausea Only and Swelling  . Lyrica [Pregabalin] Other (See Comments)    "zombie"  . Percocet [Oxycodone-Acetaminophen] Itching     Discussed warning signs or symptoms. Please see discharge instructions. Patient expresses understanding.

## 2018-09-13 NOTE — Patient Instructions (Signed)
Thank you for coming in today. Start metronidazole twice daily for 1 week.  Do not drink alcohol with this medicine.  I will send culture and wet prep results to you.  Call or go to the emergency room if you get worse, have trouble breathing, have chest pains, or palpitations.    Bacterial Vaginosis  Bacterial vaginosis is a vaginal infection that occurs when the normal balance of bacteria in the vagina is disrupted. It results from an overgrowth of certain bacteria. This is the most common vaginal infection among women ages 67-44. Because bacterial vaginosis increases your risk for STIs (sexually transmitted infections), getting treated can help reduce your risk for chlamydia, gonorrhea, herpes, and HIV (human immunodeficiency virus). Treatment is also important for preventing complications in pregnant women, because this condition can cause an early (premature) delivery. What are the causes? This condition is caused by an increase in harmful bacteria that are normally present in small amounts in the vagina. However, the reason that the condition develops is not fully understood. What increases the risk? The following factors may make you more likely to develop this condition:  Having a new sexual partner or multiple sexual partners.  Having unprotected sex.  Douching.  Having an intrauterine device (IUD).  Smoking.  Drug and alcohol abuse.  Taking certain antibiotic medicines.  Being pregnant. You cannot get bacterial vaginosis from toilet seats, bedding, swimming pools, or contact with objects around you. What are the signs or symptoms? Symptoms of this condition include:  Grey or white vaginal discharge. The discharge can also be watery or foamy.  A fish-like odor with discharge, especially after sexual intercourse or during menstruation.  Itching in and around the vagina.  Burning or pain with urination. Some women with bacterial vaginosis have no signs or symptoms.  How is this diagnosed? This condition is diagnosed based on:  Your medical history.  A physical exam of the vagina.  Testing a sample of vaginal fluid under a microscope to look for a large amount of bad bacteria or abnormal cells. Your health care provider may use a cotton swab or a small wooden spatula to collect the sample. How is this treated? This condition is treated with antibiotics. These may be given as a pill, a vaginal cream, or a medicine that is put into the vagina (suppository). If the condition comes back after treatment, a second round of antibiotics may be needed. Follow these instructions at home: Medicines  Take over-the-counter and prescription medicines only as told by your health care provider.  Take or use your antibiotic as told by your health care provider. Do not stop taking or using the antibiotic even if you start to feel better. General instructions  If you have a female sexual partner, tell her that you have a vaginal infection. She should see her health care provider and be treated if she has symptoms. If you have a female sexual partner, he does not need treatment.  During treatment: ? Avoid sexual activity until you finish treatment. ? Do not douche. ? Avoid alcohol as directed by your health care provider. ? Avoid breastfeeding as directed by your health care provider.  Drink enough water and fluids to keep your urine clear or pale yellow.  Keep the area around your vagina and rectum clean. ? Wash the area daily with warm water. ? Wipe yourself from front to back after using the toilet.  Keep all follow-up visits as told by your health care provider. This is important.  How is this prevented?  Do not douche.  Wash the outside of your vagina with warm water only.  Use protection when having sex. This includes latex condoms and dental dams.  Limit how many sexual partners you have. To help prevent bacterial vaginosis, it is best to have sex with  just one partner (monogamous).  Make sure you and your sexual partner are tested for STIs.  Wear cotton or cotton-lined underwear.  Avoid wearing tight pants and pantyhose, especially during summer.  Limit the amount of alcohol that you drink.  Do not use any products that contain nicotine or tobacco, such as cigarettes and e-cigarettes. If you need help quitting, ask your health care provider.  Do not use illegal drugs. Where to find more information  Centers for Disease Control and Prevention: AppraiserFraud.fi  American Sexual Health Association (ASHA): www.ashastd.org  U.S. Department of Health and Financial controller, Office on Women's Health: DustingSprays.pl or SecuritiesCard.it Contact a health care provider if:  Your symptoms do not improve, even after treatment.  You have more discharge or pain when urinating.  You have a fever.  You have pain in your abdomen.  You have pain during sex.  You have vaginal bleeding between periods. Summary  Bacterial vaginosis is a vaginal infection that occurs when the normal balance of bacteria in the vagina is disrupted.  Because bacterial vaginosis increases your risk for STIs (sexually transmitted infections), getting treated can help reduce your risk for chlamydia, gonorrhea, herpes, and HIV (human immunodeficiency virus). Treatment is also important for preventing complications in pregnant women, because the condition can cause an early (premature) delivery.  This condition is treated with antibiotic medicines. These may be given as a pill, a vaginal cream, or a medicine that is put into the vagina (suppository). This information is not intended to replace advice given to you by your health care provider. Make sure you discuss any questions you have with your health care provider. Document Released: 05/26/2005 Document Revised: 09/29/2016 Document Reviewed: 02/09/2016 Elsevier  Interactive Patient Education  2019 [employer].

## 2018-09-14 NOTE — Addendum Note (Signed)
Addended by: Gregor Hams on: 09/14/2018 07:23 AM   Modules accepted: Orders

## 2018-09-15 LAB — URINE CULTURE
MICRO NUMBER:: 377676
Result:: NO GROWTH
SPECIMEN QUALITY:: ADEQUATE

## 2018-09-27 MED FILL — ZOLPIDEM TARTRATE 5 MG TAB: 5 | 30 days supply | Qty: 30 | Fill #1

## 2018-10-05 ENCOUNTER — Encounter: Payer: Self-pay | Admitting: Family Medicine

## 2018-10-05 MED ORDER — CYCLOBENZAPRINE HCL 10 MG PO TABS: 10.0000 mg | ORAL_TABLET | Freq: Three times a day (TID) | ORAL | 0 refills | Status: DC | PRN

## 2018-10-11 ENCOUNTER — Encounter: Payer: Self-pay | Admitting: Family Medicine

## 2018-10-18 ENCOUNTER — Encounter: Payer: Self-pay | Admitting: Family Medicine

## 2018-10-27 MED FILL — ZOLPIDEM TARTRATE 5 MG TAB: 5 | 30 days supply | Qty: 30 | Fill #2

## 2018-11-11 ENCOUNTER — Ambulatory Visit (INDEPENDENT_AMBULATORY_CARE_PROVIDER_SITE_OTHER): Payer: No Typology Code available for payment source | Admitting: Family Medicine

## 2018-11-11 ENCOUNTER — Encounter: Payer: Self-pay | Admitting: Family Medicine

## 2018-11-11 VITALS — BP 118/82 | HR 74 | Temp 98.1°F | Wt 200.0 lb

## 2018-11-11 DIAGNOSIS — K909 Intestinal malabsorption, unspecified: Secondary | ICD-10-CM | POA: Diagnosis not present

## 2018-11-11 DIAGNOSIS — E538 Deficiency of other specified B group vitamins: Secondary | ICD-10-CM

## 2018-11-11 DIAGNOSIS — F3340 Major depressive disorder, recurrent, in remission, unspecified: Secondary | ICD-10-CM

## 2018-11-11 DIAGNOSIS — R7303 Prediabetes: Secondary | ICD-10-CM

## 2018-11-11 DIAGNOSIS — Z0000 Encounter for general adult medical examination without abnormal findings: Secondary | ICD-10-CM

## 2018-11-11 DIAGNOSIS — I10 Essential (primary) hypertension: Secondary | ICD-10-CM | POA: Diagnosis not present

## 2018-11-11 DIAGNOSIS — E162 Hypoglycemia, unspecified: Secondary | ICD-10-CM

## 2018-11-11 DIAGNOSIS — Z6834 Body mass index (BMI) 34.0-34.9, adult: Secondary | ICD-10-CM

## 2018-11-11 DIAGNOSIS — R790 Abnormal level of blood mineral: Secondary | ICD-10-CM

## 2018-11-11 DIAGNOSIS — R740 Nonspecific elevation of levels of transaminase and lactic acid dehydrogenase [LDH]: Secondary | ICD-10-CM

## 2018-11-11 DIAGNOSIS — R7401 Elevation of levels of liver transaminase levels: Secondary | ICD-10-CM

## 2018-11-11 MED ORDER — CYCLOBENZAPRINE HCL 10 MG PO TABS: 10.0000 mg | ORAL_TABLET | Freq: Three times a day (TID) | ORAL | 6 refills | Status: DC | PRN

## 2018-11-11 NOTE — Patient Instructions (Signed)
Thank you for coming in today.  Keep me updated.  Get labs today.  I will get results back to you ASAP.  Call or go to the emergency room if you get worse, have trouble breathing, have chest pains, or palpitations.

## 2018-11-11 NOTE — Progress Notes (Signed)
Stacy Woods is a 53 y.o. female who presents to [facility]: [place] today for well adult visit.   Stacy Woods is doing well overall.  She takes medications listed below.  She uses Ambien at night for insomnia which helps quite a bit.  She uses Flexeril occasionally for neck pain.  She is having a bit of neck pain now but denies severe pain or pain radiating down her arms.  She notes that Flexeril does help.  No fevers chills nausea vomiting or diarrhea.  No chest pain palpitation shortness of breath lightheadedness or dizziness. ROS as above:  Past Medical History:  Diagnosis Date  . AK (actinic keratosis) 07/14/2017   Right upper chest wall  . Anemia   . Anemia, iron deficiency 05/02/2015  . Fibromyalgia   . History of gastric bypass 05/09/2016  . Hypertension   . Insomnia 08/14/2017  . Malabsorption of iron 05/09/2016  . PONV (postoperative nausea and vomiting)   . Surgical menopause 03/24/2016   Past Surgical History:  Procedure Laterality Date  . ABDOMINAL HYSTERECTOMY  1997  . APPENDECTOMY    . CESAREAN SECTION  [id number]  . CHOLECYSTECTOMY    . GASTRIC BYPASS  2011  . KNEE ARTHROSCOPY  2002  . TONSILLECTOMY  1977  . ULNAR NERVE TRANSPOSITION Right 06/12/2015   Procedure: RIGHT CUBITAL TUNNEL RELEASE;  Surgeon: Milly Jakob, MD;  Location: [facility];  Service: Orthopedics;  Laterality: Right;   Social History   Tobacco Use  . Smoking status: Never Smoker  . Smokeless tobacco: Never Used  Substance Use Topics  . Alcohol use: No   family history includes Breast cancer in her mother; Diabetes in her father; Heart failure in her father; Hypertension in her father; Stroke in her father.  Medications: Current Outpatient Medications  Medication Sig Dispense Refill  . AMBULATORY NON FORMULARY MEDICATION One Touch Glucometer per insurance formulary. One  month of testing strips, and lancets used to check blood sugar daily. 1 Units prn  . blood glucose meter kit and supplies KIT Check fasting blood sugar daily. 1 each 0  . Cobalamin Combinations (B-12) [phone] MCG SUBL Place 1 tablet under the tongue daily. 90 each 3  . cyclobenzaprine (FLEXERIL) 10 MG tablet Take 1 tablet (10 mg total) by mouth 3 (three) times daily as needed for muscle spasms. 30 tablet 6  . escitalopram (LEXAPRO) 10 MG tablet TAKE 1 TABLET (10 MG TOTAL) BY MOUTH DAILY. 90 tablet 3  . glucagon 1 MG injection Inject 1 mg into the muscle once as needed. For low blood sugar 1 each 12  . glucose blood (IGLUCOSE TEST STRIPS) test strip Check fasting blood sugar 3-4 times daily. Dx: E16.2 100 each 11  . Lancets 30G MISC Check fasting blood sugar daily 50 each PRN  . magnesium oxide (MAG-OX) 400 MG tablet Take 2 tablets (800 mg total) by mouth at bedtime. 90 tablet 3  . zolpidem (AMBIEN) 5 MG tablet TAKE 1 TABLET (5 MG TOTAL) BY MOUTH AT BEDTIME AS NEEDED FOR SLEEP. 30 tablet 5   No current facility-administered medications for this visit.    Allergies  Allergen Reactions  . Mobic [Meloxicam] Swelling  . Shellfish Allergy Nausea Only and Swelling  . Lyrica [Pregabalin] Other (See Comments)    "zombie"  . Percocet [Oxycodone-Acetaminophen] Itching    Health Maintenance Health Maintenance  Topic Date Due  . HIV Screening  02/26/1983  . INFLUENZA  VACCINE  01/08/2019  . MAMMOGRAM  05/26/2020  . TETANUS/TDAP  03/24/2026  . COLONOSCOPY  07/29/2028     Exam:  BP 118/82   Pulse 74   Temp 98.1 F (36.7 C) (Oral)   Wt 200 lb (90.7 kg)   BMI 34.33 kg/m  Wt Readings from Last 5 Encounters:  11/11/18 200 lb (90.7 kg)  09/13/18 196 lb (88.9 kg)  07/29/18 197 lb (89.4 kg)  07/22/18 197 lb (89.4 kg)  07/15/18 196 lb (88.9 kg)      Gen: Well NAD HEENT: EOMI,  MMM Lungs: Normal work of breathing. CTABL Heart: RRR no MRG Abd: NABS, Soft. Nondistended, Nontender Exts:  Brisk capillary refill, warm and well perfused.  Psych: Alert and oriented normal speech thought process and affect.  Depression screen Stacy Woods 2/9 11/11/2018 07/05/2018 01/04/2018 11/17/2017 11/03/2017  Decreased Interest 0 0 0 1 3  Down, Depressed, Hopeless 0 0 0 1 2  PHQ - 2 Score 0 0 0 2 5  Altered sleeping 0 1 0 2 2  Tired, decreased energy 1 0 1 2 3   Change in appetite 1 1 0 2 3  Feeling bad or failure about yourself  0 0 0 0 2  Trouble concentrating 0 0 0 0 1  Moving slowly or fidgety/restless 0 0 0 0 0  Suicidal thoughts 0 0 0 0 0  PHQ-9 Score 2 2 1 8 16   Difficult doing work/chores Not difficult at all Not difficult at all Not difficult at all Not difficult at all Very difficult       Assessment and Plan: 53 y.o. female with well adult.  Doing reasonably well.  Plan to reassess fasting labs based on history of hyperlipidemia iron deficiency hypo-magnesium and low B12.  We will continue current medications.  If B12 still low we will switch to injectable or twice daily sublingual.    PDMP not reviewed this encounter. Orders Placed This Encounter  Procedures  . COMPLETE METABOLIC PANEL WITH GFR  . Lipid Panel w/reflex Direct LDL  . Vitamin B12  . CBC  . Magnesium   Meds ordered this encounter  Medications  . cyclobenzaprine (FLEXERIL) 10 MG tablet    Sig: Take 1 tablet (10 mg total) by mouth 3 (three) times daily as needed for muscle spasms.    Dispense:  30 tablet    Refill:  6     Discussed warning signs or symptoms. Please see discharge instructions. Patient expresses understanding.

## 2018-11-12 ENCOUNTER — Encounter: Payer: Self-pay | Admitting: Family Medicine

## 2018-11-12 LAB — LIPID PANEL W/REFLEX DIRECT LDL
Cholesterol: 216 mg/dL — ABNORMAL HIGH (ref <200)
HDL: 43 mg/dL — ABNORMAL LOW (ref >=50)
LDL Cholesterol (Calc): 143 mg/dL (calc) — ABNORMAL HIGH
Non-HDL Cholesterol (Calc): 173 mg/dL (calc) — ABNORMAL HIGH (ref <130)
Total CHOL/HDL Ratio: 5 (calc) — ABNORMAL HIGH (ref <5.0)
Triglycerides: 160 mg/dL — ABNORMAL HIGH (ref <150)

## 2018-11-12 LAB — MAGNESIUM: Magnesium: 2 mg/dL (ref 1.5–2.5)

## 2018-11-12 LAB — COMPLETE METABOLIC PANEL WITH GFR
AG Ratio: 1.7 (calc) (ref 1.0–2.5)
ALT: 24 U/L (ref 6–29)
AST: 30 U/L (ref 10–35)
Albumin: 4.3 g/dL (ref 3.6–5.1)
Alkaline phosphatase (APISO): 123 U/L (ref 37–153)
BUN: 9 mg/dL (ref 7–25)
CO2: 25 mmol/L (ref 20–32)
Calcium: 9.6 mg/dL (ref 8.6–10.4)
Chloride: 107 mmol/L (ref 98–110)
Creat: 0.85 mg/dL (ref 0.50–1.05)
GFR, Est African American: 93 mL/min/{1.73_m2} (ref >=60)
GFR, Est Non African American: 80 mL/min/{1.73_m2} (ref >=60)
Globulin: 2.5 g/dL (calc) (ref 1.9–3.7)
Glucose, Bld: 86 mg/dL (ref 65–99)
Potassium: 4.3 mmol/L (ref 3.5–5.3)
Sodium: 142 mmol/L (ref 135–146)
Total Bilirubin: 0.6 mg/dL (ref 0.2–1.2)
Total Protein: 6.8 g/dL (ref 6.1–8.1)

## 2018-11-12 LAB — CBC
HCT: 42.1 % (ref 35.0–45.0)
Hemoglobin: 14.3 g/dL (ref 11.7–15.5)
MCH: 30.3 pg (ref 27.0–33.0)
MCHC: 34 g/dL (ref 32.0–36.0)
MCV: 89.2 fL (ref 80.0–100.0)
MPV: 11.1 fL (ref 7.5–12.5)
Platelets: 337 10*3/uL (ref 140–400)
RBC: 4.72 10*6/uL (ref 3.80–5.10)
RDW: 13.4 % (ref 11.0–15.0)
WBC: 5.3 10*3/uL (ref 3.8–10.8)

## 2018-11-12 LAB — VITAMIN B12: Vitamin B-12: >2000 pg/mL — ABNORMAL HIGH (ref 200–1100)

## 2018-11-12 MED ORDER — GLUCAGON (RDNA) 1 MG IJ KIT: 1.0000 mg | PACK | Freq: Once | INTRAMUSCULAR | 12 refills | Status: DC | PRN

## 2018-11-15 ENCOUNTER — Encounter: Payer: Self-pay | Admitting: Family Medicine

## 2018-11-25 MED FILL — ZOLPIDEM TARTRATE 5 MG TAB: 5 | 30 days supply | Qty: 30 | Fill #3

## 2018-11-25 MED FILL — ESCITALOPRAM 10 MG TABLET: 10 | 90 days supply | Qty: 90 | Fill #1

## 2018-11-29 ENCOUNTER — Encounter: Payer: Self-pay | Admitting: Family Medicine

## 2018-11-29 DIAGNOSIS — M542 Cervicalgia: Secondary | ICD-10-CM

## 2018-11-29 DIAGNOSIS — R519 Headache, unspecified: Secondary | ICD-10-CM

## 2018-12-01 NOTE — Addendum Note (Signed)
Addended by: Gregor Hams on: 12/01/2018 07:00 AM   Modules accepted: Orders

## 2018-12-07 ENCOUNTER — Ambulatory Visit: Payer: No Typology Code available for payment source | Admitting: Physical Therapy

## 2018-12-07 ENCOUNTER — Other Ambulatory Visit: Payer: Self-pay

## 2018-12-07 ENCOUNTER — Encounter: Payer: Self-pay | Admitting: Physical Therapy

## 2018-12-07 DIAGNOSIS — M542 Cervicalgia: Secondary | ICD-10-CM | POA: Diagnosis not present

## 2018-12-07 NOTE — Therapy (Signed)
[facility] [place], [place], [address] Phone: [phone]   Fax:  [phone]  Physical Therapy Evaluation  Patient Details  Name: Stacy Woods MRN: [id number] Date of Birth: Dec 29, 1965 Referring Provider (PT): Lynne Leader   Encounter Date: 12/07/2018  PT End of Session - 12/07/18 1429    Visit Number  1    Number of Visits  12    Date for PT Re-Evaluation  01/18/19    Authorization Type  [place] [place] [facility]    PT Start Time  1332    PT Stop Time  1425    PT Time Calculation (min)  53 min    Activity Tolerance  Patient tolerated treatment well;Patient limited by pain    Behavior During Therapy  [facility] for tasks assessed/performed       Past Medical History:  Diagnosis Date  . AK (actinic keratosis) 07/14/2017   Right upper chest wall  . Anemia   . Anemia, iron deficiency 05/02/2015  . Fibromyalgia   . History of gastric bypass 05/09/2016  . Hypertension   . Insomnia 08/14/2017  . Malabsorption of iron 05/09/2016  . PONV (postoperative nausea and vomiting)   . Surgical menopause 03/24/2016    Past Surgical History:  Procedure Laterality Date  . ABDOMINAL HYSTERECTOMY  1997  . APPENDECTOMY    . CESAREAN SECTION  [id number]  . CHOLECYSTECTOMY    . GASTRIC BYPASS  2011  . KNEE ARTHROSCOPY  2002  . TONSILLECTOMY  1977  . ULNAR NERVE TRANSPOSITION Right 06/12/2015   Procedure: RIGHT CUBITAL TUNNEL RELEASE;  Surgeon: Milly Jakob, MD;  Location: [facility];  Service: Orthopedics;  Laterality: Right;    There were no vitals filed for this visit.   Subjective Assessment - 12/07/18 1335    Subjective  Pt states increased pain in L side of neck,started about 3 weeks ago. She states increased headaches and pain bilaterally. No incident to report. She works from home, on computer, full time. She is R handed. She denies  numbness/tingling in UE. No seizure history noted in PMH, but pt reports 1  seizure prior to last PT course, that was result of hypoglycemia, now controlled, has had no further incidents of seizures.    Limitations  Reading;Writing;House hold activities;Sitting;Lifting    Patient Stated Goals  decreased pain,    Currently in Pain?  Yes    Pain Score  7     Pain Location  Neck    Pain Orientation  Left;Right    Pain Descriptors / Indicators  Aching;Tightness;Sharp    Pain Onset  1 to 4 weeks ago    Pain Frequency  Intermittent    Aggravating Factors   first thing in AM, increased activity , work duties    Pain Relieving Factors  heat         [facility] PT Assessment - 12/07/18 0001      Assessment   Medical Diagnosis  Neck Pain    Referring Provider (PT)  Lynne Leader    Hand Dominance  Right    Prior Therapy  for back      Precautions   Precautions  None      Balance Screen   Has the patient fallen in the past 6 months  No      Prior Function   Level of Independence  Independent      Cognition   Overall Cognitive Status  Within Functional Limits for tasks assessed  Posture/Postural Control   Posture Comments  stiff upper thoracic region, mild forward head, rounded shoulders.       ROM / Strength   AROM / PROM / Strength  AROM;Strength      AROM   Overall AROM Comments  Cervical: mild limitation for flex/ext, Rot: WFL.;  UE: WFL      Strength   Overall Strength Comments  Shoulders: 4+/5;        Palpation   Palpation comment  Significant tenderness at sub occipital region, tightness in Bil UTs.       Special Tests   Other special tests  Neg ULTT,                 Objective measurements completed on examination: See above findings.      [facility] Adult PT Treatment/Exercise - 12/07/18 0001      Exercises   Exercises  Neck      Neck Exercises: Seated   Neck Retraction  10 reps    Cervical Rotation  10 reps    Shoulder Rolls  10 reps      Moist Heat Therapy   Number Minutes Moist Heat  10 Minutes    Moist Heat Location   Cervical      Manual Therapy   Manual Therapy  Soft tissue mobilization;Manual Traction    Soft tissue mobilization  STM to UTs, sub occipitals, sub occipital release     Manual Traction  10 sec x8 ;       Neck Exercises: Stretches   Upper Trapezius Stretch  3 reps;30 seconds;Right;Left    Lower Cervical/Upper Thoracic Stretch  3 reps;30 seconds             PT Education - 12/07/18 1436    Education provided  Yes    Education Details  PT POC, HEP, Exam findings.    Person(s) Educated  Patient    Methods  Explanation;Demonstration;Tactile cues;Verbal cues;Handout    Comprehension  Verbalized understanding;Returned demonstration;Verbal cues required;Need further instruction       PT Short Term Goals - 12/07/18 1430      PT SHORT TERM GOAL #1   Title  Pt to be independent with initial HEP    Time  2    Period  Weeks    Status  New    Target Date  12/21/18        PT Long Term Goals - 12/07/18 1431      PT LONG TERM GOAL #1   Title  Pt to be independent wtih final HEP    Time  6    Period  Weeks    Status  New    Target Date  01/18/19      PT LONG TERM GOAL #2   Title  Pt to report decreased pain in cervical region to 0-2/10 with work duties and sleep.    Time  6    Period  Weeks    Status  New    Target Date  01/18/19      PT LONG TERM GOAL #3   Title  Pt to demo soft tissue restrictions in cervical region, to be WNL    Time  6    Period  Weeks    Status  New    Target Date  01/18/19      PT LONG TERM GOAL #4   Title  Pt to demo ability to self correct posture in clinic and state proper desk set up  for decreased pain with work duties.    Time  6    Period  Weeks    Status  New    Target Date  01/18/19             Plan - 12/07/18 1415    Clinical Impression Statement  Pt presents with primary complaint of increased pain in cervical region. She has good preservation of ROM, but has increased pain in high cervical region and musculature. Sub  occipitals very tender and are contributing to headaches. Pt also states recent diffiuclty with visiton up close, will schedule eye exam. She has decreased ability for full functional activities, due to pain. Pt to benefit from skilled care to improve deficits, and return to PLOF without pain.    Examination-Activity Limitations  Sit;Sleep;Lift    Examination-Participation Restrictions  Laundry;Yard Work;Driving;Community Activity;Cleaning    Stability/Clinical Decision Making  Stable/Uncomplicated    Clinical Decision Making  Low    Rehab Potential  Good    PT Frequency  2x / week    PT Duration  6 weeks    PT Treatment/Interventions  ADLs/Self Care Home Management;Canalith Repostioning;Cryotherapy;Electrical Stimulation;Ultrasound;Traction;Moist Heat;Iontophoresis 4mg /ml Dexamethasone;Functional mobility training;Therapeutic activities;Therapeutic exercise;Patient/family education;Neuromuscular re-education;Manual techniques;Passive range of motion;Dry needling;Spinal Manipulations;Visual/perceptual remediation/compensation;Vestibular;Taping;Joint Manipulations    PT Home Exercise Plan  [phone]    Consulted and Agree with Plan of Care  Patient       Patient will benefit from skilled therapeutic intervention in order to improve the following deficits and impairments:  Decreased range of motion, Increased muscle spasms, Decreased activity tolerance, Pain, Improper body mechanics, Decreased strength  Visit Diagnosis: 1. Cervicalgia        Problem List Patient Active Problem List   Diagnosis Date Noted  . Transaminitis 12/01/2017  . MDD (recurrent major depressive disorder) in remission ([place]) 11/03/2017  . Insomnia 08/14/2017  . AK (actinic keratosis) 07/14/2017  . Low magnesium level 01/12/2017  . B12 deficiency 01/01/2017  . Trochanteric bursitis of left hip 10/08/2016  . Malabsorption of iron 05/09/2016  . History of gastric bypass 05/09/2016  . Arthritis of knee, degenerative  04/14/2016  . Surgical menopause 03/24/2016  . Family history of breast cancer 03/24/2016  . Left knee pain 10/01/2015  . Hypoglycemia 06/12/2015  . Iron deficiency anemia 05/02/2015  . Cubital tunnel syndrome on right 09/14/2014  . Degenerative disc disease, cervical 07/27/2014  . Prediabetes 09/01/2013  . Fibromyalgia 04/11/2013  . Essential hypertension, benign 04/11/2013  . Anxiety state 04/11/2013    Lyndee Hensen, PT, DPT 2:57 PM  12/07/18    [facility] [address] [place], [place], [address] Phone: [phone]   Fax:  [phone]  Name: Stacy Woods MRN: [id number] Date of Birth: [birth year]

## 2018-12-07 NOTE — Patient Instructions (Signed)
Access Code: [email or web address]  URL: [email or web address]  Date: 12/07/2018  Prepared by: Lyndee Hensen   Exercises Seated Cervical Retraction - 10 reps - 1 sets - 3 hold - 3x daily Neck Rotation - 10 reps - 1 sets - 2x daily Seated Scapular Retraction - 10 reps - 1 sets - 3 hold - 2x daily Standing Lower Cervical and Upper Thoracic Stretch - 3 reps - 20 hold - 2x daily Seated Cervical Sidebending Stretch - 3 reps - 30 hold - 2x daily

## 2018-12-09 ENCOUNTER — Other Ambulatory Visit: Payer: Self-pay

## 2018-12-09 ENCOUNTER — Ambulatory Visit: Payer: No Typology Code available for payment source | Admitting: Physical Therapy

## 2018-12-09 ENCOUNTER — Encounter: Payer: Self-pay | Admitting: Physical Therapy

## 2018-12-09 DIAGNOSIS — M542 Cervicalgia: Secondary | ICD-10-CM | POA: Diagnosis not present

## 2018-12-09 NOTE — Therapy (Signed)
[place], [place], [address] Phone: [phone]   Fax:  [phone]  Physical Therapy Treatment  Patient Details  Name: Stacy Woods MRN: [id number] Date of Birth: August 09, 1965 Referring Provider (PT): Lynne Leader   Encounter Date: 12/09/2018  PT End of Session - 12/09/18 0806    Visit Number  2    Number of Visits  12    Date for PT Re-Evaluation  01/18/19    Authorization Type  [employer]    PT Start Time  0803    PT Stop Time  0849    PT Time Calculation (min)  46 min    Activity Tolerance  Patient tolerated treatment well;Patient limited by pain    Behavior During Therapy  [facility] for tasks assessed/performed       Past Medical History:  Diagnosis Date  . AK (actinic keratosis) 07/14/2017   Right upper chest wall  . Anemia   . Anemia, iron deficiency 05/02/2015  . Fibromyalgia   . History of gastric bypass 05/09/2016  . Hypertension   . Insomnia 08/14/2017  . Malabsorption of iron 05/09/2016  . PONV (postoperative nausea and vomiting)   . Surgical menopause 03/24/2016    Past Surgical History:  Procedure Laterality Date  . ABDOMINAL HYSTERECTOMY  1997  . APPENDECTOMY    . CESAREAN SECTION  [id number]  . CHOLECYSTECTOMY    . GASTRIC BYPASS  2011  . KNEE ARTHROSCOPY  2002  . TONSILLECTOMY  1977  . ULNAR NERVE TRANSPOSITION Right 06/12/2015   Procedure: RIGHT CUBITAL TUNNEL RELEASE;  Surgeon: Milly Jakob, MD;  Location: [place];  Service: Orthopedics;  Laterality: Right;    There were no vitals filed for this visit.  Subjective Assessment - 12/09/18 0806    Subjective  Pt reports she has had a dull headache since last visit.  "I'm sore".   Pt reports her headaches normally only last 2 hrs.  She has been taking tylenol and using heat (doesn't respond well to ice due to fibromyalgia). She reports relief with riding motorcycle.    Currently in Pain?  Yes    Pain Score   8     Pain Location  Neck    Pain Orientation  Right;Left    Pain Descriptors / Indicators  Dull    Aggravating Factors   worse in PM    Pain Relieving Factors  heat and medicine.         [facility] PT Assessment - 12/09/18 0001      Assessment   Medical Diagnosis  Neck Pain    Referring Provider (PT)  Lynne Leader    Onset Date/Surgical Date  09/27/18    Hand Dominance  Right    Next MD Visit  to be scheduled.     Prior Therapy  for back       [facility] Adult PT Treatment/Exercise - 12/09/18 0001      Neck Exercises: Machines for Strengthening   UBE (Upper Arm Bike)  L1: 1 min each direction, standing, for warm up.       Neck Exercises: Standing   Neck Retraction  5 reps;3 secs   back against noodle.    Neck Retraction Limitations  moved to supine; improved form and tolerance     Other Standing Exercises  scap retraction with noodle behind back x 5 reps of 3 sec; moved to supine with improved tolerance.       Neck  Exercises: Supine   Neck Retraction  5 reps;5 secs    Cervical Rotation  Right;Left;5 reps    Other Supine Exercise  scap retraction x 5 sec x 10 reps       Modalities   Modalities  --   declined; will do at home.      Manual Therapy   Manual Therapy  Myofascial release;Soft tissue mobilization    Manual therapy comments  pt supine     Soft tissue mobilization  gentle STM to cervical paraspinals and upper trap.     Myofascial Release  suboccipital release       Neck Exercises: Stretches   Upper Trapezius Stretch  Right;Left;2 reps;20 seconds    Levator Stretch  Right;Left;1 rep;20 seconds    Other Neck Stretches  thoracic ext over back of chair with hands supporting head x 4 reps of 5 seconds.   Seated cervical flexion to neutral and diagonals (forearms resting on thighs) x 3 reps each     Other Neck Stretches  3 position doorway stretch x 2 reps of each position held 20 sec each              PT Education - 12/09/18 0902    Education provided  Yes     Education Details  HEP modified; issued biofreeze sample    Person(s) Educated  Patient    Methods  Explanation;Handout;Demonstration    Comprehension  Verbalized understanding;Returned demonstration       PT Short Term Goals - 12/07/18 1430      PT SHORT TERM GOAL #1   Title  Pt to be independent with initial HEP    Time  2    Period  Weeks    Status  New    Target Date  12/21/18        PT Long Term Goals - 12/07/18 1431      PT LONG TERM GOAL #1   Title  Pt to be independent wtih final HEP    Time  6    Period  Weeks    Status  New    Target Date  01/18/19      PT LONG TERM GOAL #2   Title  Pt to report decreased pain in cervical region to 0-2/10 with work duties and sleep.    Time  6    Period  Weeks    Status  New    Target Date  01/18/19      PT LONG TERM GOAL #3   Title  Pt to demo soft tissue restrictions in cervical region, to be WNL    Time  6    Period  Weeks    Status  New    Target Date  01/18/19      PT LONG TERM GOAL #4   Title  Pt to demo ability to self correct posture in clinic and state proper desk set up for decreased pain with work duties.    Time  6    Period  Weeks    Status  New    Target Date  01/18/19            Plan - 12/09/18 0857    Clinical Impression Statement  Pt very sensitive to pressure with manual therapy, especially in suboccipitals. Tightness noted in Rt SCM, Rt scalenes,Rt upper trap, and suboccipitals.  Modified HEP today.  Encouraged self care with TENS and heat over weekend to assist in pain reduction.  Goals are  ongoing.    Examination-Activity Limitations  Sit;Sleep;Lift    Examination-Participation Restrictions  Laundry;Yard Work;Driving;Community Activity;Cleaning    Stability/Clinical Decision Making  Stable/Uncomplicated    Rehab Potential  Good    PT Frequency  2x / week    PT Duration  6 weeks    PT Treatment/Interventions  ADLs/Self Care Home Management;Canalith Repostioning;Cryotherapy;Electrical  Stimulation;Ultrasound;Traction;Moist Heat;Iontophoresis 4mg /ml Dexamethasone;Functional mobility training;Therapeutic activities;Therapeutic exercise;Patient/family education;Neuromuscular re-education;Manual techniques;Passive range of motion;Dry needling;Spinal Manipulations;Visual/perceptual remediation/compensation;Vestibular;Taping;Joint Manipulations    PT Next Visit Plan  assess response to new HEP; trial of IASTM to upper trap and paraspinals.    PT Home Exercise Plan  [phone]    Consulted and Agree with Plan of Care  Patient       Patient will benefit from skilled therapeutic intervention in order to improve the following deficits and impairments:  Decreased range of motion, Increased muscle spasms, Decreased activity tolerance, Pain, Improper body mechanics, Decreased strength  Visit Diagnosis: 1. Cervicalgia        Problem List Patient Active Problem List   Diagnosis Date Noted  . Transaminitis 12/01/2017  . MDD (recurrent major depressive disorder) in remission ([place]) 11/03/2017  . Insomnia 08/14/2017  . AK (actinic keratosis) 07/14/2017  . Low magnesium level 01/12/2017  . B12 deficiency 01/01/2017  . Trochanteric bursitis of left hip 10/08/2016  . Malabsorption of iron 05/09/2016  . History of gastric bypass 05/09/2016  . Arthritis of knee, degenerative 04/14/2016  . Surgical menopause 03/24/2016  . Family history of breast cancer 03/24/2016  . Left knee pain 10/01/2015  . Hypoglycemia 06/12/2015  . Iron deficiency anemia 05/02/2015  . Cubital tunnel syndrome on right 09/14/2014  . Degenerative disc disease, cervical 07/27/2014  . Prediabetes 09/01/2013  . Fibromyalgia 04/11/2013  . Essential hypertension, benign 04/11/2013  . Anxiety state 04/11/2013   Kerin Perna, PTA 12/09/18 9:11 AM  [facility], [place], [address] Phone: [phone]   Fax:  [phone]  Name:  Stacy Woods MRN: [id number] Date of Birth: 22-Jun-1965

## 2018-12-13 ENCOUNTER — Encounter: Payer: Self-pay | Admitting: Physical Therapy

## 2018-12-13 ENCOUNTER — Ambulatory Visit: Payer: No Typology Code available for payment source | Admitting: Physical Therapy

## 2018-12-13 ENCOUNTER — Other Ambulatory Visit: Payer: Self-pay

## 2018-12-13 DIAGNOSIS — M542 Cervicalgia: Secondary | ICD-10-CM

## 2018-12-13 NOTE — Therapy (Signed)
[place], [place], [address] Phone: [phone]   Fax:  [phone]  Physical Therapy Treatment  Patient Details  Name: Stacy Woods MRN: [id number] Date of Birth: [birth year] Referring Provider (PT): Lynne Leader   Encounter Date: 12/13/2018  PT End of Session - 12/13/18 1640    Visit Number  3    Number of Visits  12    Date for PT Re-Evaluation  01/18/19    Authorization Type  [facility] [employer]    PT Start Time  2542    PT Stop Time  7062    PT Time Calculation (min)  43 min       Past Medical History:  Diagnosis Date  . AK (actinic keratosis) 07/14/2017   Right upper chest wall  . Anemia   . Anemia, iron deficiency 05/02/2015  . Fibromyalgia   . History of gastric bypass 05/09/2016  . Hypertension   . Insomnia 08/14/2017  . Malabsorption of iron 05/09/2016  . PONV (postoperative nausea and vomiting)   . Surgical menopause 03/24/2016    Past Surgical History:  Procedure Laterality Date  . ABDOMINAL HYSTERECTOMY  1997  . APPENDECTOMY    . CESAREAN SECTION  [id number]  . CHOLECYSTECTOMY    . GASTRIC BYPASS  2011  . KNEE ARTHROSCOPY  2002  . TONSILLECTOMY  1977  . ULNAR NERVE TRANSPOSITION Right 06/12/2015   Procedure: RIGHT CUBITAL TUNNEL RELEASE;  Surgeon: Milly Jakob, MD;  Location: [place];  Service: Orthopedics;  Laterality: Right;    There were no vitals filed for this visit.  Subjective Assessment - 12/13/18 1642    Subjective  Persistant headache continues.  She used her TENS over weekend without much relief.    Limitations  Reading;Writing;House hold activities;Sitting;Lifting    Patient Stated Goals  decreased pain,    Currently in Pain?  Yes    Pain Score  7     Pain Location  Neck    Pain Orientation  Right    Pain Descriptors / Indicators  Headache;Constant;Dull    Aggravating Factors   worse in PM    Pain Relieving Factors  heat         [facility] PT  Assessment - 12/13/18 0001      Assessment   Medical Diagnosis  Neck Pain    Referring Provider (PT)  Lynne Leader    Onset Date/Surgical Date  09/27/18    Hand Dominance  Right    Next MD Visit  to be scheduled.     Prior Therapy  for back       [facility] Adult PT Treatment/Exercise - 12/13/18 0001      Neck Exercises: Machines for Strengthening   UBE (Upper Arm Bike)  L1: 1 min each direction, standing, for warm up.       Neck Exercises: Standing   Other Standing Exercises  shoulder ext stretch holding cane x 3 sec x 10 reps       Neck Exercises: Supine   Neck Retraction  5 reps;5 secs    Cervical Rotation  Right;Left;5 reps    Other Supine Exercise  scap retraction x 5 sec x 10 reps     Other Supine Exercise  prolonged stretch with arms off of table, 30 sec x 2, then 8 active snow angels.       Modalities   Modalities  Moist Heat;Ultrasound      Moist Heat Therapy  Number Minutes Moist Heat  15 Minutes   during ther ex, then manual    Moist Heat Location  Cervical      Ultrasound   Ultrasound Location  Rt cervical paraspinals to upper trap    Ultrasound Parameters  100%, 1.1 w/cm2, 8 min     Ultrasound Goals  Pain   tightness      Manual Therapy   Manual Therapy  Myofascial release;Soft tissue mobilization    Soft tissue mobilization  gentle STM to Rt cervical paraspinals, Rt SCM, and upper trap.   gentle IASTM to Rt and Lt cervical paraspinals and upper trap    Myofascial Release  suboccipital release, Rt SCM, Rt scalane       Neck Exercises: Stretches   Upper Trapezius Stretch  Right;Left;2 reps;20 seconds    Other Neck Stretches  3 position doorway stretch x 2 reps of each position held 20 sec each                PT Short Term Goals - 12/07/18 1430      PT SHORT TERM GOAL #1   Title  Pt to be independent with initial HEP    Time  2    Period  Weeks    Status  New    Target Date  12/21/18        PT Long Term Goals - 12/07/18 1431      PT LONG  TERM GOAL #1   Title  Pt to be independent wtih final HEP    Time  6    Period  Weeks    Status  New    Target Date  01/18/19      PT LONG TERM GOAL #2   Title  Pt to report decreased pain in cervical region to 0-2/10 with work duties and sleep.    Time  6    Period  Weeks    Status  New    Target Date  01/18/19      PT LONG TERM GOAL #3   Title  Pt to demo soft tissue restrictions in cervical region, to be WNL    Time  6    Period  Weeks    Status  New    Target Date  01/18/19      PT LONG TERM GOAL #4   Title  Pt to demo ability to self correct posture in clinic and state proper desk set up for decreased pain with work duties.    Time  6    Period  Weeks    Status  New    Target Date  01/18/19            Plan - 12/13/18 1644    Clinical Impression Statement  Persistant tightness in Rt cervical paraspinals, suboccipitals and SCM.  Pt responded well to MHP prior to and during manual therapy to neck.  Pt reported 1 point decrease in pain after manual therapy and additional point after [place] to neck.  Goals are ongoing at this time.    Examination-Activity Limitations  Sit;Sleep;Lift    Examination-Participation Restrictions  Laundry;Yard Work;Driving;Community Activity;Cleaning    Stability/Clinical Decision Making  Stable/Uncomplicated    Rehab Potential  Good    PT Frequency  2x / week    PT Duration  6 weeks    PT Treatment/Interventions  ADLs/Self Care Home Management;Canalith Repostioning;Cryotherapy;Electrical Stimulation;Ultrasound;Traction;Moist Heat;Iontophoresis 4mg /ml Dexamethasone;Functional mobility training;Therapeutic activities;Therapeutic exercise;Patient/family education;Neuromuscular re-education;Manual techniques;Passive range of motion;Dry needling;Spinal Manipulations;Visual/perceptual remediation/compensation;Vestibular;Taping;Joint  Manipulations    PT Next Visit Plan  assess response to IASTM and [place] to upper trap and paraspinals.    PT Home Exercise  Plan  [phone]    Consulted and Agree with Plan of Care  Patient       Patient will benefit from skilled therapeutic intervention in order to improve the following deficits and impairments:  Decreased range of motion, Increased muscle spasms, Decreased activity tolerance, Pain, Improper body mechanics, Decreased strength  Visit Diagnosis: 1. Cervicalgia        Problem List Patient Active Problem List   Diagnosis Date Noted  . Transaminitis 12/01/2017  . MDD (recurrent major depressive disorder) in remission ([place]) 11/03/2017  . Insomnia 08/14/2017  . AK (actinic keratosis) 07/14/2017  . Low magnesium level 01/12/2017  . B12 deficiency 01/01/2017  . Trochanteric bursitis of left hip 10/08/2016  . Malabsorption of iron 05/09/2016  . History of gastric bypass 05/09/2016  . Arthritis of knee, degenerative 04/14/2016  . Surgical menopause 03/24/2016  . Family history of breast cancer 03/24/2016  . Left knee pain 10/01/2015  . Hypoglycemia 06/12/2015  . Iron deficiency anemia 05/02/2015  . Cubital tunnel syndrome on right 09/14/2014  . Degenerative disc disease, cervical 07/27/2014  . Prediabetes 09/01/2013  . Fibromyalgia 04/11/2013  . Essential hypertension, benign 04/11/2013  . Anxiety state 04/11/2013   Kerin Perna, PTA 12/13/18 4:53 PM  [facility] [place], [place], [address] Phone: [phone]   Fax:  [phone]  Name: Stacy Woods MRN: [id number] Date of Birth: 1965/09/27

## 2018-12-15 ENCOUNTER — Encounter: Payer: Self-pay | Admitting: Physical Therapy

## 2018-12-15 ENCOUNTER — Other Ambulatory Visit: Payer: Self-pay

## 2018-12-15 ENCOUNTER — Encounter: Payer: Self-pay | Admitting: Family Medicine

## 2018-12-15 ENCOUNTER — Ambulatory Visit: Payer: No Typology Code available for payment source | Admitting: Physical Therapy

## 2018-12-15 DIAGNOSIS — M542 Cervicalgia: Secondary | ICD-10-CM

## 2018-12-15 NOTE — Therapy (Addendum)
[place], [place], [address] Phone: [phone]   Fax:  [phone]  Physical Therapy Treatment  Patient Details  Name: Stacy Woods MRN: [id number] Date of Birth: 29-Feb-1966 Referring Provider (PT): Lynne Leader   Encounter Date: 12/15/2018  PT End of Session - 12/15/18 1410    Visit Number  4    Number of Visits  12    Date for PT Re-Evaluation  01/18/19    Authorization Type  [employer]    PT Start Time  8466    PT Stop Time  5993    PT Time Calculation (min)  49 min       Past Medical History:  Diagnosis Date  . AK (actinic keratosis) 07/14/2017   Right upper chest wall  . Anemia   . Anemia, iron deficiency 05/02/2015  . Fibromyalgia   . History of gastric bypass 05/09/2016  . Hypertension   . Insomnia 08/14/2017  . Malabsorption of iron 05/09/2016  . PONV (postoperative nausea and vomiting)   . Surgical menopause 03/24/2016    Past Surgical History:  Procedure Laterality Date  . ABDOMINAL HYSTERECTOMY  1997  . APPENDECTOMY    . CESAREAN SECTION  [id number]  . CHOLECYSTECTOMY    . GASTRIC BYPASS  2011  . KNEE ARTHROSCOPY  2002  . TONSILLECTOMY  1977  . ULNAR NERVE TRANSPOSITION Right 06/12/2015   Procedure: RIGHT CUBITAL TUNNEL RELEASE;  Surgeon: Milly Jakob, MD;  Location: [facility];  Service: Orthopedics;  Laterality: Right;    There were no vitals filed for this visit.  Subjective Assessment - 12/15/18 1426    Subjective  Pt reports the headache persists, but worse. She has tried TENS, her partner rubbing her neck, heat, but pain persists. She plans to contact MD after PT visit to inquire about possible medication.    Currently in Pain?  Yes    Pain Score  7     Pain Location  Neck    Pain Orientation  Right;Upper    Pain Descriptors / Indicators  Aching;Headache         [facility] PT Assessment - 12/15/18 0001      Assessment   Medical Diagnosis  Neck  Pain    Referring Provider (PT)  Lynne Leader    Onset Date/Surgical Date  09/27/18    Hand Dominance  Right    Next MD Visit  to be scheduled.     Prior Therapy  for back       [facility] Adult PT Treatment/Exercise - 12/15/18 0001      Neck Exercises: Machines for Strengthening   Nustep  L5-6: 6 min (arms/legs)       Neck Exercises: Standing   Neck Retraction  5 reps;3 secs    Other Standing Exercises  back against noodle x 5 sec x 10 reps of W's, x 10 reps of L's.       Manual Therapy   Manual Therapy  Soft tissue mobilization    Manual therapy comments  pt prone and supine - skilled palpation required for DN     Joint Mobilization  cervical CPA mobs pt supine Grade II/III    Soft tissue mobilization  deep tissue work through the suboccipital area; cervical paraspinals bilat w/ pt prone and supine     Myofascial Release  posterior cervical to thoracic spine     Passive ROM  axial extension with gentle cervical traction; PROM/stretch into  cervical flexion from chin tucked position     Manual Traction  cervical traction 20-30 sec pull x 4-5 reps       Neck Exercises: Stretches   Upper Trapezius Stretch  Right;Left;2 reps;20 seconds    Other Neck Stretches  thoracic ext over back of chair with hands supporting head x 4 reps of 5 seconds.   Seated cervical flexion to neutral and diagonals (forearms resting on thighs) x 3 reps each     Other Neck Stretches  3 position doorway stretch x 2 reps of each position held 20 sec each        Trigger Point Dry Needling - 12/15/18 0001    Muscles Treated Head and Neck  Suboccipitals;Semispinalis capitus;Cervical multifidi    Dry Needling Comments  pt prone     Other Dry Needling  DN bilat     Semispinalis capitus Response  Twitch reponse elicited;Palpable increased muscle length    Cervical multifidi Response  Twitch reponse elicited;Palpable increased muscle length           PT Education - 12/15/18 1430    Education provided  Yes     Education Details  DN info    Person(s) Educated  Patient    Methods  Explanation    Comprehension  Verbalized understanding       PT Short Term Goals - 12/07/18 1430      PT SHORT TERM GOAL #1   Title  Pt to be independent with initial HEP    Time  2    Period  Weeks    Status  New    Target Date  12/21/18        PT Long Term Goals - 12/07/18 1431      PT LONG TERM GOAL #1   Title  Pt to be independent wtih final HEP    Time  6    Period  Weeks    Status  New    Target Date  01/18/19      PT LONG TERM GOAL #2   Title  Pt to report decreased pain in cervical region to 0-2/10 with work duties and sleep.    Time  6    Period  Weeks    Status  New    Target Date  01/18/19      PT LONG TERM GOAL #3   Title  Pt to demo soft tissue restrictions in cervical region, to be WNL    Time  6    Period  Weeks    Status  New    Target Date  01/18/19      PT LONG TERM GOAL #4   Title  Pt to demo ability to self correct posture in clinic and state proper desk set up for decreased pain with work duties.    Time  6    Period  Weeks    Status  New    Target Date  01/18/19            Plan - 12/15/18 1431    Clinical Impression Statement  Persistant headache and neck pain.  Trial of DN initiated today, provided by supervising PT, Celyn Holt.  Noted decreased palpable tightness and twitch response with DN. Patient holds head in flexed position through upper cervical/occipital area. She has difficulty releasing neck/chin into more neutral posture/position.Improved movement noted with manual work following DN.Pt reported no change in pain level at end of session.  Goals are ongoing.  Examination-Activity Limitations  Sit;Sleep;Lift    Examination-Participation Restrictions  Laundry;Yard Work;Driving;Community Activity;Cleaning    Stability/Clinical Decision Making  Stable/Uncomplicated    Rehab Potential  Good    PT Frequency  2x / week    PT Duration  6 weeks    PT  Treatment/Interventions  ADLs/Self Care Home Management;Canalith Repostioning;Cryotherapy;Electrical Stimulation;Ultrasound;Traction;Moist Heat;Iontophoresis 40m/ml Dexamethasone;Functional mobility training;Therapeutic activities;Therapeutic exercise;Patient/family education;Neuromuscular re-education;Manual techniques;Passive range of motion;Dry needling;Spinal Manipulations;Visual/perceptual remediation/compensation;Vestibular;Taping;Joint Manipulations    PT Next Visit Plan  assess response to DN and manual work; work on elongation of cervical musculature; may benefit from PROM/stretching cervical spine and deep tissue work through aEngineer, maintenance nodding yes/no to improve ease of movement suboccipital region and trial of occipital inhibition with golf balls (even if she tolerates 30-90 sec intially)    PT Home Exercise Plan  [id number]   Consulted and Agree with Plan of Care  Patient       Patient will benefit from skilled therapeutic intervention in order to improve the following deficits and impairments:  Decreased range of motion, Increased muscle spasms, Decreased activity tolerance, Pain, Improper body mechanics, Decreased strength  Visit Diagnosis: 1. Cervicalgia        Problem List Patient Active Problem List   Diagnosis Date Noted  . Transaminitis 12/01/2017  . MDD (re[place] 11/03/2017  . Insomnia 08/14/2017  . AK (actinic keratosis) 07/14/2017  . Low magnesium level 01/12/2017  . B12 deficiency 01/01/2017  . Trochanteric bursitis of left hip 10/08/2016  . Malabsorption of iron 05/09/2016  . History of gastric bypass 05/09/2016  . Arthritis of knee, degenerative 04/14/2016  . Surgical menopause 03/24/2016  . Family history of breast cancer 03/24/2016  . Left knee pain 10/01/2015  . Hypoglycemia 06/12/2015  . Iron deficiency anemia 05/02/2015  . Cubital tunnel syndrome on right 09/14/2014  .  Degenerative disc disease, cervical 07/27/2014  . Prediabetes 09/01/2013  . Fibromyalgia 04/11/2013  . Essential hypertension, benign 04/11/2013  . Anxiety state 04/11/2013   JKerin Perna PTA 12/15/18 3:01 PM   Celyn P. HHelene KelpPT, MPH 12/15/18 3:01 PM    [facility] [address] [address]Phone: [phone]  Fax:  [phone] Name: DLIAHNA BRICKNERMRN: [id number] of Birth: 08/31/1965/06/08  PHYSICAL THERAPY DISCHARGE SUMMARY  Visits from Start of Care: 4  Plan: Patient agrees to discharge.  Patient goals were not met. Patient is being discharged due to not returning since the last visit.  ?????  Pt did not return to PT     LLyndee Hensen PT, DPT 1:55 PM  02/07/19

## 2018-12-15 NOTE — Patient Instructions (Signed)

## 2018-12-16 ENCOUNTER — Ambulatory Visit (INDEPENDENT_AMBULATORY_CARE_PROVIDER_SITE_OTHER): Payer: No Typology Code available for payment source

## 2018-12-16 ENCOUNTER — Ambulatory Visit (INDEPENDENT_AMBULATORY_CARE_PROVIDER_SITE_OTHER): Payer: No Typology Code available for payment source | Admitting: Family Medicine

## 2018-12-16 ENCOUNTER — Encounter: Payer: Self-pay | Admitting: Family Medicine

## 2018-12-16 VITALS — BP 151/92 | HR 71 | Temp 98.4°F | Ht 64.0 in | Wt 207.0 lb

## 2018-12-16 DIAGNOSIS — G4489 Other headache syndrome: Secondary | ICD-10-CM | POA: Diagnosis not present

## 2018-12-16 DIAGNOSIS — M542 Cervicalgia: Secondary | ICD-10-CM | POA: Diagnosis not present

## 2018-12-16 MED ORDER — TRAMADOL HCL 50 MG PO TABS: 50.0000 mg | ORAL_TABLET | Freq: Three times a day (TID) | ORAL | 0 refills | Status: DC | PRN

## 2018-12-16 NOTE — Progress Notes (Signed)
Stacy Woods is a 53 y.o. female who presents to [facility] today for   Neck pain and headache.  Berkley has had posterior neck pain and posterior headache for over a month now.  She was seen for this issue on June 4.  She was thought to have cervical spasm and pain as result and treated with physical therapy.  She had several sessions of physical therapy which is actually made her worse.  On the right.  She notes a clicking sensation in her ears when she was her head.  She denies any radiating pain weakness or numbness fevers or chills.  She denies any weakness or numbness or loss of function.  She denies any visual changes.  She notes her symptoms are not consistent with previous episodes of migraine.  She has tried tyenol and flexeril which did not help. She rates her pain as severe at times and notes that it does significantly impact her quality of life.      ROS as above:  Exam:  BP (!) 151/92   Pulse 71   Temp 98.4 F (36.9 C) (Oral)   Ht 5' 4"  (1.626 m)   Wt 207 lb (Stacy Woods)   SpO2 100%   BMI 35.53 Woods/m  Wt Readings from Last 5 Encounters:  12/16/18 207 lb (Stacy Woods)  11/11/18 200 lb (90.7 Woods)  09/13/18 196 lb (88.9 Woods)  07/29/18 197 lb (89.4 Woods)  07/22/18 197 lb (89.4 Woods)    Gen: Well NAD HEENT: EOMI,  MMM NL TM BL Lungs: Normal work of breathing. CTABL Heart: RRR no MRG Abd: NABS, Soft. Nondistended, Nontender Exts: Brisk capillary refill, warm and well perfused.   MSK: Cspine: nontender to spinal midline. TTP BL perispinal muscles at cspine worse on right and at base of skull. Pain is alos present even with light touch in this area. Normal Cervical motion.  Intact reflex strength and sensation BL UE.  Neuro: Alert and oriented normal coordination and balance and gait.   Lab and Radiology Results EXAM: MRI HEAD WITHOUT AND WITH CONTRAST   TECHNIQUE: Multiplanar, multiecho pulse sequences of the brain and surrounding structures were obtained without and with intravenous contrast.  CONTRAST:  73m MULTIHANCE GADOBENATE DIMEGLUMINE 529 MG/ML IV SOLN  COMPARISON:  None.  FINDINGS: Brain: There is no evidence of acute infarct, intracranial hemorrhage, mass, midline shift, or extra-axial fluid collection. Dedicated thin section imaging through the temporal lobes demonstrates normal volume and signal of the hippocampi. There is no evidence of heterotopia or cortical dysplasia. The ventricles and sulci are normal. Small foci of T2 hyperintensity scattered in the cerebral white matter are mildly advanced for age, predominantly subcortical and in the frontal lobes. No abnormal enhancement is identified.  Vascular: Major intracranial vascular flow voids are preserved.  Skull and upper cervical spine: Unremarkable bone marrow signal.  Sinuses/Orbits: Unremarkable orbits. Paranasal sinuses and mastoid air cells are clear.  Other: None.  IMPRESSION: 1. No acute intracranial abnormality or etiology of seizures identified. 2. Mild cerebral white matter T2 signal changes, nonspecific. Considerations include early chronic small vessel ischemia, sequelae of trauma, hypercoagulable state, vasculitis, migraines, prior infection, and demyelination.   Electronically Signed   By: ALogan BoresM.D.   On: 01/26/2017 15:04  I personally (independently) visualized and performed the interpretation of the images attached in this note.   Xray cspine images personally and independently reviewed.  Loss of normal cervical  lordosis.  No fractures, deformity or significant degenerative changes.    Assessment and Plan: 53 y.o. female with Posterior neck pain and headache present >6 weeks. Patient is failing typical conservative management. I think the pain is likely due to occipital neuralgia.  I discussed options. I do not  have much experience personally in occipital nerve block or trigger point injections. Plan for referral to neurology/headache for further eval and treatment.  She already has an established relationship with neurology about 2 years ago for seizure workup.  Limited tramadol for pain control. Cannot tolerate NSAIDs 2/2 gastric bypass surgery.   PDMP reviewed during this encounter. Orders Placed This Encounter  Procedures  . DG Cervical Spine Complete    Standing Status:   Future    Number of Occurrences:   1    Standing Expiration Date:   02/16/2020    Order Specific Question:   Reason for Exam (SYMPTOM  OR DIAGNOSIS REQUIRED)    Answer:   eval pain in neck and base of skull and headache    Order Specific Question:   Is patient pregnant?    Answer:   No    Order Specific Question:   Preferred imaging location?    Answer:   [facility]    Order Specific Question:   Radiology Contrast Protocol - do NOT remove file path    Answer:   [email or web address]  . Ambulatory referral to Neurology    Referral Priority:   Urgent    Referral Type:   Consultation    Referral Reason:   Specialty Services Required    Requested Specialty:   Neurology    Number of Visits Requested:   1   Meds ordered this encounter  Medications  . traMADol (ULTRAM) 50 MG tablet    Sig: Take 1 tablet (50 mg total) by mouth every 8 (eight) hours as needed for severe pain.    Dispense:  15 tablet    Refill:  0     Historical information moved to improve visibility of documentation.  Past Medical History:  Diagnosis Date  . AK (actinic keratosis) 07/14/2017   Right upper chest wall  . Anemia   . Anemia, iron deficiency 05/02/2015  . Fibromyalgia   . History of gastric bypass 05/09/2016  . Hypertension   . Insomnia 08/14/2017  . Malabsorption of iron 05/09/2016  . PONV (postoperative nausea and vomiting)   . Surgical menopause 03/24/2016   Past Surgical History:   Procedure Laterality Date  . ABDOMINAL HYSTERECTOMY  1997  . APPENDECTOMY    . CESAREAN SECTION  [id number]  . CHOLECYSTECTOMY    . GASTRIC BYPASS  2011  . KNEE ARTHROSCOPY  2002  . TONSILLECTOMY  1977  . ULNAR NERVE TRANSPOSITION Right 06/12/2015   Procedure: RIGHT CUBITAL TUNNEL RELEASE;  Surgeon: Milly Jakob, MD;  Location: [facility];  Service: Orthopedics;  Laterality: Right;   Social History   Tobacco Use  . Smoking status: Never Smoker  . Smokeless tobacco: Never Used  Substance Use Topics  . Alcohol use: No   family history includes Breast cancer in her mother; Diabetes in her father; Heart failure in her father; Hypertension in her father; Stroke in her father.  Medications: Current Outpatient Medications  Medication Sig Dispense Refill  . AMBULATORY NON FORMULARY MEDICATION One Touch Glucometer per insurance formulary. One month of testing strips, and lancets used to check blood sugar daily. 1 Units prn  . blood glucose meter kit and supplies  KIT Check fasting blood sugar daily. 1 each 0  . escitalopram (LEXAPRO) 10 MG tablet TAKE 1 TABLET (10 MG TOTAL) BY MOUTH DAILY. 90 tablet 3  . glucagon 1 MG injection Inject 1 mg into the muscle once as needed for up to 1 dose. For low blood sugar 1 each 12  . glucose blood (IGLUCOSE TEST STRIPS) test strip Check fasting blood sugar 3-4 times daily. Dx: E16.2 100 each 11  . Lancets 30G MISC Check fasting blood sugar daily 50 each PRN  . magnesium oxide (MAG-OX) 400 MG tablet Take 2 tablets (800 mg total) by mouth at bedtime. 90 tablet 3  . zolpidem (AMBIEN) 5 MG tablet TAKE 1 TABLET (5 MG TOTAL) BY MOUTH AT BEDTIME AS NEEDED FOR SLEEP. 30 tablet 5  . traMADol (ULTRAM) 50 MG tablet Take 1 tablet (50 mg total) by mouth every 8 (eight) hours as needed for severe pain. 15 tablet 0   No current facility-administered medications for this visit.    Allergies  Allergen Reactions  . Mobic [Meloxicam] Swelling  . Shellfish  Allergy Nausea Only and Swelling  . Lyrica [Pregabalin] Other (See Comments)    "zombie"  . Percocet [Oxycodone-Acetaminophen] Itching     Discussed warning signs or symptoms. Please see discharge instructions. Patient expresses understanding.

## 2018-12-16 NOTE — Patient Instructions (Signed)
Thank you for coming in today. You should hear from neurology soon about headache. I will also communicate with your neurologist. However if you cannot get in soon let me know. I have a backup plan to use pain management doctor for this.   Use tramadol sparingly.    Occipital Neuralgia  Occipital neuralgia is a type of headache that causes brief episodes of very bad pain in the back of your head. Pain from occipital neuralgia may spread (radiate) to other parts of your head. These headaches may be caused by irritation of the nerves that leave your spinal cord high up in your neck, just below the base of your skull (occipital nerves). Your occipital nerves transmit sensations from the back of your head, the top of your head, and the areas behind your ears. What are the causes? This condition can occur without any known cause (primary headache syndrome). In other cases, this condition is caused by pressure on or irritation of one of the two occipital nerves. Pressure and irritation may be due to:  Muscle spasm in the neck.  Neck injury.  Wear and tear of the vertebrae in the neck (osteoarthritis).  Disease of the disks that separate the vertebrae.  Swollen blood vessels that put pressure on the occipital nerves.  Infections.  Tumors.  Diabetes. What are the signs or symptoms? This condition causes brief burning, stabbing, electric, shocking, or shooting pain which can radiate to the top of the head. It can happen on one side or both sides of the head. It can also cause:  Pain behind the eye.  Pain triggered by neck movement or hair brushing.  Scalp tenderness.  Aching in the back of the head between episodes of very bad pain.  Pain gets worse with exposure to bright lights. How is this diagnosed? There is no test that diagnoses this condition. Your health care provider may diagnose this condition based on a physical exam and your symptoms. Other tests may be done, such as:   Imaging studies of the brain and neck (cervical spine), such as an MRI or CT scan. These look for causes of pinched nerves.  Applying pressure to the nerves in the neck to try to re-create the pain.  Injection of numbing medicine into the occipital nerve areas to see if pain goes away (diagnostic nerve block). How is this treated? Treatment for this condition may begin with simple measures, such as:  Rest.  Massage.  Applying heat or cold on the area.  Over-the-counter pain relievers. If these measures do not work, you may need other treatments, including:  Medicines, such as: ? Prescription-strength anti-inflammatory medicines. ? Muscle relaxants. ? Anti-seizure medicines, which can relieve pain. ? Antidepressants, which can relieve pain. ? Injected medicines, such as medicines that numb the area (local anesthetic) and steroids.  Pulsed radiofrequency ablation. This is when wires are implanted to deliver electrical impulses that block pain signals from the occipital nerve.  Surgery to relieve nerve pressure.  Physical therapy. Follow these instructions at home: Pain management      Avoid any activities that cause pain.  Rest when you have an attack of pain.  Try gentle massage to relieve pain.  Try a different pillow or sleeping position.  If directed, apply heat to the affected area as told by your health care provider. Use the heat source that your health care provider recommends, such as a moist heat pack or a heating pad. ? Place a towel between your skin and  the heat source. ? Leave the heat on for 20-30 minutes. ? Remove the heat if your skin turns bright red. This is especially important if you are unable to feel pain, heat, or cold. You may have a greater risk of getting burned.  If directed, apply ice to the back of the head and neck area as told by your health care provider. ? Put ice in a plastic bag. ? Place a towel between your skin and the bag. ?  Leave the ice on for 20 minutes, 2-3 times per day. General instructions  Take over-the-counter and prescription medicines only as told by your health care provider.  Avoid things that make your symptoms worse, such as bright lights.  Try to stay active. Get regular exercise that does not cause pain. Ask your health care provider to suggest safe exercises for you.  Work with a physical therapist to learn stretching exercises you can do at home.  Practice good posture.  Keep all follow-up visits as told by your health care provider. This is important. Contact a health care provider if:  Your medicine is not working.  You have new or worsening symptoms. Get help right away if:  You have very bad head pain that does not go away.  You have a sudden change in vision, balance, or speech. Summary  Occipital neuralgia is a type of headache that causes brief episodes of very bad pain in the back of your head.  Pain from occipital neuralgia may spread (radiate) to other parts of your head.  Treatment for this condition includes rest, massage, and medicines. This information is not intended to replace advice given to you by your health care provider. Make sure you discuss any questions you have with your health care provider. Document Released: 05/20/2001 Document Revised: 05/12/2017 Document Reviewed: 07/31/2016 Elsevier Patient Education  [place].  Occipital Nerve Block Patient Information  Description: The occipital nerves originate in the cervical (neck) spinal cord and travel upward through muscle and tissue to supply sensation to the back of the head and top of the scalp.  In addition, the nerves control some of the muscles of the scalp.  Occipital neuralgia is an irritation of these nerves which can cause headaches, numbness of the scalp, and neck discomfort.     The occipital nerve block will interrupt nerve transmission through these nerves and can relieve pain and  spasm.  The block consists of insertion of a small needle under the skin in the back of the head to deposit local anesthetic (numbing medicine) and/or steroids around the nerve.  The entire block usually lasts less than 5 minutes.  Conditions which may be treated by occipital blocks:   Muscular pain and spasm of the scalp  Nerve irritation, back of the head  Headaches  Upper neck pain  Preparation for the injection:  1. Do not eat any solid food or dairy products within 8 hours of your appointment. 2. You may drink clear liquids up to 3 hours before appointment.  Clear liquids include water, black coffee, juice or soda.  No milk or cream please. 3. You may take your regular medication, including pain medications, with a sip of water before you appointment.  Diabetics should hold regular insulin (if taken separately) and take 1/2 normal NPH dose the morning of the procedure.  Carry some sugar containing items with you to your appointment. 4. A driver must accompany you and be prepared to drive you home after your procedure.  5. Bring all your current medications with you. 6. An IV may be inserted and sedation may be given at the discretion of the physician. 7. A blood pressure cuff, EKG, and other monitors will often be applied during the procedure.  Some patients may need to have extra oxygen administered for a short period. 8. You will be asked to provide medical information, including your allergies and medications, prior to the procedure.  We must know immediately if you are taking blood thinners (like Coumadin/Warfarin) or if you are allergic to IV iodine contrast (dye).  We must know if you could possible be pregnant.  9. Do not wear a high collared shirt or turtleneck.  Tie long hair up in the back if possible.  Possible side-effects:   Bleeding from needle site  Infection (rare, may require surgery)  Nerve injury (rare)  Hair on back of neck can be tinged with iodine scrub  (this will wash out)  Light-headedness (temporary)  Pain at injection site (several days)  Decreased blood pressure (rare, temporary)  Seizure (very rare)  Call if you experience:   Hives or difficulty breathing ( go to the emergency room)  Inflammation or drainage at the injection site(s)  Please note:  Although the local anesthetic injected can often make your painful muscles or headache feel good for several hours after the injection, the pain may return.  It takes 3-7 days for steroids to work.  You may not notice any pain relief for at least one week.  If effective, we will often do a series of injections spaced 3-6 weeks apart to maximally decrease your pain.

## 2018-12-20 ENCOUNTER — Encounter: Payer: No Typology Code available for payment source | Admitting: Physical Therapy

## 2018-12-22 ENCOUNTER — Encounter: Payer: Self-pay | Admitting: Neurology

## 2018-12-22 ENCOUNTER — Ambulatory Visit: Payer: No Typology Code available for payment source | Admitting: Neurology

## 2018-12-22 ENCOUNTER — Other Ambulatory Visit: Payer: Self-pay

## 2018-12-22 VITALS — BP 142/100 | HR 84 | Temp 97.8°F | Ht 64.0 in | Wt 208.0 lb

## 2018-12-22 DIAGNOSIS — M5481 Occipital neuralgia: Secondary | ICD-10-CM | POA: Diagnosis not present

## 2018-12-22 MED ORDER — GABAPENTIN 300 MG PO CAPS: ORAL_CAPSULE | ORAL | 2 refills | Status: DC

## 2018-12-22 NOTE — Progress Notes (Signed)
Nerve block w/o steroid: Pt signed consent  0.5% Bupivocaine 9 mL LOT: [id number] EXP: 01/21 NDC: 92493-241-99  2% Lidocaine 9 mL LOT: [id number] EXP: 01/08/2019 NDC: 1444-5848-35

## 2018-12-22 NOTE — Progress Notes (Signed)
[facility]    Provider:  Dr Jaynee Eagles Requesting Provider: Gregor Hams, MD Primary Care Provider:  Gregor Hams, MD  CC:  Occipital headaches  HPI:  Stacy Woods is a 53 y.o. female here as requested by Dr. Leta Baptist as a second opinion  for headaches. She has fibromyalgia. She has had the headache for 4 weeks. It started with neck stiffness and pain. Dr. Georgina Snell. Muscle relaxers did not help. She had therapy and also dry needling and it did not help. She was diagnosed with occipital neuralgia by Dr. Georgina Snell. She also has a clicking in both of her ears, she hears clicking. Starts at the base and radiates up the back of the head but will radiate to behind the eyes. Nothing has helped so far. The past 4 weeks have been miserable. Continuous. 7/10 pain today, waxes and wanes. Stretching and PT did not help. Could try TCAs, cymbalta, like a knife in the back of her head. Wrapping head in a heat blanket helps. Sleeping on it makes it worse. Also neck tightness and soreness in the muscles. No inciting events. No prior changes in meds or injuries or viruses. She woke up with a crick in her neck and then the headache started. No other focal neurologic deficits, associated symptoms, inciting events or modifiable factors.  Side effects to Lyrica in the past. Gabapentin didn't help Fibro but did not try it for this. Flexeril. Amitriptyline.   Review of Systems: Patient complains of symptoms per HPI as well as the following symptoms: neck pain and occipital headache. Pertinent negatives and positives per HPI. All others negative.   Social History   Socioeconomic History  . Marital status: Divorced    Spouse name: Not on file  . Number of children: 2  . Years of education: Assoc  . Highest education level: Not on file  Occupational History  . Occupation: [employer]  Social Needs  . Financial resource strain: Not on file  . Food insecurity    Worry: Not on file    Inability: Not on file   . Transportation needs    Medical: Not on file    Non-medical: Not on file  Tobacco Use  . Smoking status: Never Smoker  . Smokeless tobacco: Never Used  Substance and Sexual Activity  . Alcohol use: No  . Drug use: No  . Sexual activity: Never    Birth control/protection: Surgical  Lifestyle  . Physical activity    Days per week: Not on file    Minutes per session: Not on file  . Stress: Not on file  Relationships  . Social Herbalist on phone: Not on file    Gets together: Not on file    Attends religious service: Not on file    Active member of club or organization: Not on file    Attends meetings of clubs or organizations: Not on file    Relationship status: Not on file  . Intimate partner violence    Fear of current or ex partner: Not on file    Emotionally abused: Not on file    Physically abused: Not on file    Forced sexual activity: Not on file  Other Topics Concern  . Not on file  Social History Narrative   Lives with her daughter, son in law, and grandson   Right-handed   Caffeine: 16 oz soda daily    Family History  Problem Relation Age of Onset  .  Diabetes Father   . Hypertension Father   . Stroke Father   . Heart failure Father   . CAD Father        has stents  . Breast cancer Mother   . Migraines Sister   . Colon cancer Neg Hx   . Colon polyps Neg Hx   . Esophageal cancer Neg Hx   . Stomach cancer Neg Hx   . Rectal cancer Neg Hx     Past Medical History:  Diagnosis Date  . AK (actinic keratosis) 07/14/2017   Right upper chest wall  . Anemia   . Anemia, iron deficiency 05/02/2015  . Fibromyalgia   . History of gastric bypass 05/09/2016  . Hypertension   . Insomnia 08/14/2017  . Malabsorption of iron 05/09/2016  . PONV (postoperative nausea and vomiting)   . Seizure ([place]) 2018   from low blood sugar   . Surgical menopause 03/24/2016    Patient Active Problem List   Diagnosis Date Noted  . Transaminitis 12/01/2017  . MDD  (recurrent major depressive disorder) in remission ([place]) 11/03/2017  . Insomnia 08/14/2017  . AK (actinic keratosis) 07/14/2017  . Low magnesium level 01/12/2017  . B12 deficiency 01/01/2017  . Trochanteric bursitis of left hip 10/08/2016  . Malabsorption of iron 05/09/2016  . History of gastric bypass 05/09/2016  . Arthritis of knee, degenerative 04/14/2016  . Surgical menopause 03/24/2016  . Family history of breast cancer 03/24/2016  . Left knee pain 10/01/2015  . Hypoglycemia 06/12/2015  . Iron deficiency anemia 05/02/2015  . Cubital tunnel syndrome on right 09/14/2014  . Degenerative disc disease, cervical 07/27/2014  . Prediabetes 09/01/2013  . Fibromyalgia 04/11/2013  . Essential hypertension, benign 04/11/2013  . Anxiety state 04/11/2013    Past Surgical History:  Procedure Laterality Date  . ABDOMINAL HYSTERECTOMY  1997  . APPENDECTOMY    . CESAREAN SECTION  [id number]  . CHOLECYSTECTOMY    . GASTRIC BYPASS  2011  . KNEE ARTHROSCOPY  2002  . TONSILLECTOMY  1977  . ULNAR NERVE TRANSPOSITION Right 06/12/2015   Procedure: RIGHT CUBITAL TUNNEL RELEASE;  Surgeon: Milly Jakob, MD;  Location: [facility];  Service: Orthopedics;  Laterality: Right;    Current Outpatient Medications  Medication Sig Dispense Refill  . AMBULATORY NON FORMULARY MEDICATION One Touch Glucometer per insurance formulary. One month of testing strips, and lancets used to check blood sugar daily. 1 Units prn  . blood glucose meter kit and supplies KIT Check fasting blood sugar daily. 1 each 0  . escitalopram (LEXAPRO) 10 MG tablet TAKE 1 TABLET (10 MG TOTAL) BY MOUTH DAILY. 90 tablet 3  . glucagon 1 MG injection Inject 1 mg into the muscle once as needed for up to 1 dose. For low blood sugar 1 each 12  . glucose blood (IGLUCOSE TEST STRIPS) test strip Check fasting blood sugar 3-4 times daily. Dx: E16.2 100 each 11  . Lancets 30G MISC Check fasting blood sugar daily 50 each PRN  .  magnesium oxide (MAG-OX) 400 MG tablet Take 2 tablets (800 mg total) by mouth at bedtime. 90 tablet 3  . traMADol (ULTRAM) 50 MG tablet Take 1 tablet (50 mg total) by mouth every 8 (eight) hours as needed for severe pain. 15 tablet 0  . zolpidem (AMBIEN) 5 MG tablet TAKE 1 TABLET (5 MG TOTAL) BY MOUTH AT BEDTIME AS NEEDED FOR SLEEP. 30 tablet 5  . gabapentin (NEURONTIN) 300 MG capsule Take 1-2 caps at bedtime.  May also take 1 cap twice a day as needed. 120 capsule 2   No current facility-administered medications for this visit.     Allergies as of 12/22/2018 - Review Complete 12/22/2018  Allergen Reaction Noted  . Mobic [meloxicam] Swelling 03/07/2013  . Shellfish allergy Nausea Only and Swelling 03/07/2013  . Lyrica [pregabalin] Other (See Comments) 05/23/2014  . Percocet [oxycodone-acetaminophen] Itching 11/13/2015    Vitals: BP (!) 142/100 (BP Location: Right Arm, Patient Position: Sitting)   Pulse 84   Temp 97.8 F (36.6 C) Comment: taken by front staff  Ht 5' 4"  (1.626 m)   Wt 208 lb (94.3 kg)   BMI 35.70 kg/m  Last Weight:  Wt Readings from Last 1 Encounters:  12/22/18 208 lb (94.3 kg)   Last Height:   Ht Readings from Last 1 Encounters:  12/22/18 5' 4"  (1.626 m)     Physical exam: Exam: Gen: NAD, conversant, well nourised, obese, well groomed                     CV: RRR, no MRG. No Carotid Bruits. No peripheral edema, warm, nontender Eyes: Conjunctivae clear without exudates or hemorrhage  Neuro: Detailed Neurologic Exam  Speech:    Speech is normal; fluent and spontaneous with normal comprehension.  Cognition:    The patient is oriented to person, place, and time;     recent and remote memory intact;     language fluent;     normal attention, concentration,     fund of knowledge Cranial Nerves:    The pupils are equal, round, and reactive to light. The fundi are normal and spontaneous venous pulsations are present. Visual fields are full to finger  confrontation. Extraocular movements are intact. Trigeminal sensation is intact and the muscles of mastication are normal. The face is symmetric. The palate elevates in the midline. Hearing intact. Voice is normal. Shoulder shrug is normal. The tongue has normal motion without fasciculations.   Coordination:    Normal finger to nose and heel to shin. Normal rapid alternating movements.   Gait:    Heel-toe and tandem gait are normal.   Motor Observation:    No asymmetry, no atrophy, and no involuntary movements noted. Tone:    Normal muscle tone.    Posture:    Posture is normal. normal erect    Strength:    Strength is V/V in the upper and lower limbs.      Sensation: intact to LT     Reflex Exam:  DTR's:    Deep tendon reflexes in the upper and lower extremities are normal bilaterally.   Toes:    The toes are downgoing bilaterally.   Clonus:    Clonus is absent.    Assessment/Plan:  Patient of Dr. Leta Baptist who is here for evaluation of a headache for several weeks appears to be occipital neuralgia will try nerve blocks today and start Gabapentin.  Meds ordered this encounter  Medications  . gabapentin (NEURONTIN) 300 MG capsule    Sig: Take 1-2 caps at bedtime. May also take 1 cap twice a day as needed.    Dispense:  120 capsule    Refill:  2    All procedures a documented blood were medically necessary, reasonable and appropriate based on the patient's history, medical diagnosis and physician opinion. Verbal informed consent was obtained from the patient, patient was informed of potential risk of procedure, including bruising, bleeding, hematoma formation, infection, muscle weakness, muscle pain,  numbness, transient hypertension, transient hyperglycemia and transient insomnia among others. All areas injected were topically clean with isopropyl rubbing alcohol. Nonsterile nonlatex gloves were worn during the procedure.  1. Greater occipital nerve block (24932). The greater  occipital nerve site was identified at the nuchal line medial to the occipital artery. Medication was injected into the left and right occipital nerve areas and suboccipital areas. Patient's condition is associated with inflammation of the greater occipital nerve and associated multiple groups. Injection was deemed medically necessary, reasonable and appropriate. Injection represents a separate and unique surgical service. The lesser occipital nerve site was identified approximately 2 cm lateral to the greater occipital nerve. Occasion was injected into the left and right occipital nerve areas. Patient's condition is associated with inflammation of the lesser occipital nerve and associated muscle groups. Injection was deemed medically necessary, reasonable and appropriate. Injection represents a separate and unique surgical service.  Discussed: To prevent or relieve headaches, try the following: Cool Compress. Lie down and place a cool compress on your head.  Avoid headache triggers. If certain foods or odors seem to have triggered your migraines in the past, avoid them. A headache diary might help you identify triggers.  Include physical activity in your daily routine. Try a daily walk or other moderate aerobic exercise.  Manage stress. Find healthy ways to cope with the stressors, such as delegating tasks on your to-do list.  Practice relaxation techniques. Try deep breathing, yoga, massage and visualization.  Eat regularly. Eating regularly scheduled meals and maintaining a healthy diet might help prevent headaches. Also, drink plenty of fluids.  Follow a regular sleep schedule. Sleep deprivation might contribute to headaches Consider biofeedback. With this mind-body technique, you learn to control certain bodily functions - such as muscle tension, heart rate and blood pressure - to prevent headaches or reduce headache pain.    Proceed to emergency room if you experience new or worsening symptoms or  symptoms do not resolve, if you have new neurologic symptoms or if headache is severe, or for any concerning symptom.   Provided education and documentation from American headache Society toolbox including articles on: chronic migraine medication overuse headache, chronic migraines, prevention of migraines, behavioral and other nonpharmacologic treatments for headache.  A total of 45 minutes was spent face-to-face with this patient. Over half this time was spent on counseling patient on the  1. Bilateral occipital neuralgia    diagnosis and different diagnostic and therapeutic options, counseling and coordination of care, risks ans benefits of management, compliance, or risk factor reduction and education.  This does not include time spent on nerve blocks.     Cc: Gregor Hams, MD,     Sarina Ill, MD  [facility] [address] [place], [place] [address]  Phone [phone] Fax [phone]

## 2018-12-23 ENCOUNTER — Encounter: Payer: No Typology Code available for payment source | Admitting: Physical Therapy

## 2018-12-27 ENCOUNTER — Institutional Professional Consult (permissible substitution): Payer: Self-pay | Admitting: Neurology

## 2018-12-27 ENCOUNTER — Telehealth: Payer: Self-pay | Admitting: Neurology

## 2018-12-27 NOTE — Telephone Encounter (Signed)
Pt has called to inform that the last nerve block didnt work for her.  Phone rep spoke with RN Romelle Starcher who offered pt Wed of this week to come back for another with a check in of 12:45.  Pt accepted

## 2018-12-28 MED FILL — ZOLPIDEM TARTRATE 5 MG TAB: 5 | 30 days supply | Qty: 30 | Fill #4

## 2018-12-29 ENCOUNTER — Ambulatory Visit: Payer: No Typology Code available for payment source | Admitting: Neurology

## 2018-12-29 ENCOUNTER — Other Ambulatory Visit: Payer: Self-pay

## 2018-12-29 VITALS — Temp 98.0°F

## 2018-12-29 DIAGNOSIS — M5481 Occipital neuralgia: Secondary | ICD-10-CM

## 2018-12-29 MED ORDER — TIZANIDINE HCL 4 MG PO TABS: 4.0000 mg | ORAL_TABLET | Freq: Four times a day (QID) | ORAL | 6 refills | Status: DC | PRN

## 2018-12-29 NOTE — Progress Notes (Signed)
Orders for Toradol 30 mg IV x 1 and Depacon 1 gram IV x 1 signed by Dr. Jaynee Eagles and given to infusion nurse.

## 2018-12-29 NOTE — Progress Notes (Signed)
Nerve block w/o steroid: Pt signed consent  0.5% Bupivocaine 9 mL LOT: [id number] EXP: 06/2019 NDC: 17356-701-41  2% Lidocaine 9 mL LOT: [id number] EXP: 01/08/2019 NDC: 0301-3143-88

## 2018-12-29 NOTE — Progress Notes (Signed)
53 year old with occipital neuralgia and neck pain with movement. Neuro exam in non focal. Pain is bilateral and is located in the distribution of the greater, lesser and/or third occipital nerves, paroxysmal and brief, painful, sharp, with tenderness and trigger points at the emergence of the greater occipital nerve.Excellent response to nerve blocks headache complete;y went away for 2 days then returned. Today back to a 7/10 and   Performed by Dr. Jaynee Eagles M.D. . All procedures a documented blood were medically necessary, reasonable and appropriate based on the patient's history, medical diagnosis and physician opinion. Verbal informed consent was obtained from the patient, patient was informed of potential risk of procedure, including bruising, bleeding, hematoma formation, infection, muscle weakness, muscle pain, numbness, transient hypertension, transient hyperglycemia and transient insomnia among others. All areas injected were topically clean with isopropyl rubbing alcohol. Nonsterile nonlatex gloves were worn during the procedure.   She has tried PT, steroids, medications, muscle relaxers(flexeril), tramadol, dry needling, massage on gabapentin now. She tried Amitriptyline and Lyrica in the past and could not tolerate for other disorders. Will continue to titrate on the Gabapentin. Will add Tizanidine. Will refer to Dr. Laurence Spates at Ortho for other occipital nerve procedures. Nerve block today. Will give Toradol today. Increase Gabapentin to 300mg  3x a day and then 600mg  3x a day and consider Horizant or Gralise (Extened release Gabapentin).   1. Greater occipital nerve block [phone]). The greater occipital nerve site was identified at the nuchal line medial to the occipital artery. Medication was injected into the left and right occipital nerve areas and suboccipital areas. Patient's condition is associated with inflammation of the greater occipital nerve and associated multiple groups. Injection was  deemed medically necessary, reasonable and appropriate. Injection represents a separate and unique surgical service.  Continue Gabapentin will increase. Will send to Dr. Laurence Spates for evaluation of other pain procedures for occipital neuralgia.   Orders Placed This Encounter  Procedures  . Ambulatory referral to Orthopedic Surgery   Meds ordered this encounter  Medications  . tiZANidine (ZANAFLEX) 4 MG tablet    Sig: Take 1 tablet (4 mg total) by mouth every 6 (six) hours as needed for muscle spasms.    Dispense:  90 tablet    Refill:  6    A total of 40 minutes was spent face-to-face with this patient. Over half this time was spent on counseling patient on the  1. Bilateral occipital neuralgia    diagnosis and different diagnostic and therapeutic options, counseling and coordination of care, risks ans benefits of management, compliance, or risk factor reduction and education.

## 2018-12-29 NOTE — Patient Instructions (Signed)
Increase Gabapentin to 1-2 caps (300-600mg ) three times a day Tizanidine every 6 hours as needed Dr. Laurence Spates pain procedure for occipital neuralgia  Gabapentin capsules or tablets What is this medicine? GABAPENTIN (GA ba pen tin) is used to control seizures in certain types of epilepsy. It is also used to treat certain types of nerve pain. This medicine may be used for other purposes; ask your health care provider or pharmacist if you have questions. COMMON BRAND NAME(S): Active-PAC with Gabapentin, Gabarone, Neurontin What should I tell my health care provider before I take this medicine? They need to know if you have any of these conditions:  history of drug abuse or alcohol abuse problem  kidney disease  lung or breathing disease  suicidal thoughts, plans, or attempt; a previous suicide attempt by you or a family member  an unusual or allergic reaction to gabapentin, other medicines, foods, dyes, or preservatives  pregnant or trying to get pregnant  breast-feeding How should I use this medicine? Take this medicine by mouth with a glass of water. Follow the directions on the prescription label. You can take it with or without food. If it upsets your stomach, take it with food. Take your medicine at regular intervals. Do not take it more often than directed. Do not stop taking except on your doctor's advice. If you are directed to break the 600 or 800 mg tablets in half as part of your dose, the extra half tablet should be used for the next dose. If you have not used the extra half tablet within 28 days, it should be thrown away. A special MedGuide will be given to you by the pharmacist with each prescription and refill. Be sure to read this information carefully each time. Talk to your pediatrician regarding the use of this medicine in children. While this drug may be prescribed for children as young as 3 years for selected conditions, precautions do apply. Overdosage: If you  think you have taken too much of this medicine contact a poison control center or emergency room at once. NOTE: This medicine is only for you. Do not share this medicine with others. What if I miss a dose? If you miss a dose, take it as soon as you can. If it is almost time for your next dose, take only that dose. Do not take double or extra doses. What may interact with this medicine? This medicine may interact with the following medications:  alcohol  antihistamines for allergy, cough, and cold  certain medicines for anxiety or sleep  certain medicines for depression like amitriptyline, fluoxetine, sertraline  certain medicines for seizures like phenobarbital, primidone  certain medicines for stomach problems  general anesthetics like halothane, isoflurane, methoxyflurane, propofol  local anesthetics like lidocaine, pramoxine, tetracaine  medicines that relax muscles for surgery  narcotic medicines for pain  phenothiazines like chlorpromazine, mesoridazine, prochlorperazine, thioridazine This list may not describe all possible interactions. Give your health care provider a list of all the medicines, herbs, non-prescription drugs, or dietary supplements you use. Also tell them if you smoke, drink alcohol, or use illegal drugs. Some items may interact with your medicine. What should I watch for while using this medicine? Visit your doctor or health care provider for regular checks on your progress. You may want to keep a record at home of how you feel your condition is responding to treatment. You may want to share this information with your doctor or health care provider at each visit. You should  contact your doctor or health care provider if your seizures get worse or if you have any new types of seizures. Do not stop taking this medicine or any of your seizure medicines unless instructed by your doctor or health care provider. Stopping your medicine suddenly can increase your  seizures or their severity. This medicine may cause serious skin reactions. They can happen weeks to months after starting the medicine. Contact your health care provider right away if you notice fevers or flu-like symptoms with a rash. The rash may be red or purple and then turn into blisters or peeling of the skin. Or, you might notice a red rash with swelling of the face, lips or lymph nodes in your neck or under your arms. Wear a medical identification bracelet or chain if you are taking this medicine for seizures, and carry a card that lists all your medications. You may get drowsy, dizzy, or have blurred vision. Do not drive, use machinery, or do anything that needs mental alertness until you know how this medicine affects you. To reduce dizzy or fainting spells, do not sit or stand up quickly, especially if you are an older patient. Alcohol can increase drowsiness and dizziness. Avoid alcoholic drinks. Your mouth may get dry. Chewing sugarless gum or sucking hard candy, and drinking plenty of water will help. The use of this medicine may increase the chance of suicidal thoughts or actions. Pay special attention to how you are responding while on this medicine. Any worsening of mood, or thoughts of suicide or dying should be reported to your health care provider right away. Women who become pregnant while using this medicine may enroll in the [place] Pregnancy Registry by calling [phone]. This registry collects information about the safety of antiepileptic drug use during pregnancy. What side effects may I notice from receiving this medicine? Side effects that you should report to your doctor or health care professional as soon as possible:  allergic reactions like skin rash, itching or hives, swelling of the face, lips, or tongue  breathing problems  rash, fever, and swollen lymph nodes  redness, blistering, peeling or loosening of the skin, including inside  the mouth  suicidal thoughts, mood changes Side effects that usually do not require medical attention (report to your doctor or health care professional if they continue or are bothersome):  dizziness  drowsiness  headache  nausea, vomiting  swelling of ankles, feet, hands  tiredness This list may not describe all possible side effects. Call your doctor for medical advice about side effects. You may report side effects to FDA at 1-800-FDA-1088. Where should I keep my medicine? Keep out of reach of children. This medicine may cause accidental overdose and death if it taken by other adults, children, or pets. Mix any unused medicine with a substance like cat litter or coffee grounds. Then throw the medicine away in a sealed container like a sealed bag or a coffee can with a lid. Do not use the medicine after the expiration date. Store at room temperature between 15 and 30 degrees C (59 and 86 degrees F). NOTE: This sheet is a summary. It may not cover all possible information. If you have questions about this medicine, talk to your doctor, pharmacist, or health care provider.  2020 Elsevier/Gold Standard (2018-08-27 14:16:43)   Tizanidine tablets or capsules What is this medicine? TIZANIDINE (tye ZAN i deen) helps to relieve muscle spasms. It may be used to help in the treatment of multiple  sclerosis and spinal cord injury. This medicine may be used for other purposes; ask your health care provider or pharmacist if you have questions. COMMON BRAND NAME(S): Zanaflex What should I tell my health care provider before I take this medicine? They need to know if you have any of these conditions:  kidney disease  liver disease  low blood pressure  mental disorder  an unusual or allergic reaction to tizanidine, other medicines, lactose (tablets only), foods, dyes, or preservatives  pregnant or trying to get pregnant  breast-feeding How should I use this medicine? Take this  medicine by mouth with a full glass of water. Take this medicine on an empty stomach, at least 30 minutes before or 2 hours after food. Do not take with food unless you talk with your doctor. Follow the directions on the prescription label. Take your medicine at regular intervals. Do not take your medicine more often than directed. Do not stop taking except on your doctor's advice. Suddenly stopping the medicine can be very dangerous. Talk to your pediatrician regarding the use of this medicine in children. Patients over 95 years old may have a stronger reaction and need a smaller dose. Overdosage: If you think you have taken too much of this medicine contact a poison control center or emergency room at once. NOTE: This medicine is only for you. Do not share this medicine with others. What if I miss a dose? If you miss a dose, take it as soon as you can. If it is almost time for your next dose, take only that dose. Do not take double or extra doses. What may interact with this medicine? Do not take this medicine with any of the following medications:  ciprofloxacin  fluvoxamine  narcotic medicines for cough  thiabendazole This medicine may also interact with the following medications:  acyclovir  alcohol  antihistamines for allergy, cough, and cold  baclofen  certain medicines for anxiety or sleep  certain medicines for blood pressure, heart disease, irregular heartbeat  certain medicines for depression like amitriptyline, fluoxetine, sertraline  certain medicines for seizures like phenobarbital, primidone  certain medicines for stomach problems like cimetidine, famotidine  female hormones, like estrogens or progestins and birth control pills, patches, rings, or injections  general anesthetics like halothane, isoflurane, methoxyflurane, propofol  local anesthetics like lidocaine, pramoxine, tetracaine  medicines that relax muscles for surgery  narcotic medicines for  pain  phenothiazines like chlorpromazine, mesoridazine, prochlorperazine  ticlopidine  zileuton This list may not describe all possible interactions. Give your health care provider a list of all the medicines, herbs, non-prescription drugs, or dietary supplements you use. Also tell them if you smoke, drink alcohol, or use illegal drugs. Some items may interact with your medicine. What should I watch for while using this medicine? Tell your doctor or health care professional if your symptoms do not start to get better or if they get worse. You may get drowsy or dizzy. Do not drive, use machinery, or do anything that needs mental alertness until you know how this medicine affects you. Do not stand or sit up quickly, especially if you are an older patient. This reduces the risk of dizzy or fainting spells. Alcohol may interfere with the effect of this medicine. Avoid alcoholic drinks. If you are taking another medicine that also causes drowsiness, you may have more side effects. Give your health care provider a list of all medicines you use. Your doctor will tell you how much medicine to take. Do not  take more medicine than directed. Call emergency for help if you have problems breathing or unusual sleepiness. Your mouth may get dry. Chewing sugarless gum or sucking hard candy, and drinking plenty of water may help. Contact your doctor if the problem does not go away or is severe. What side effects may I notice from receiving this medicine? Side effects that you should report to your doctor or health care professional as soon as possible:  allergic reactions like skin rash, itching or hives, swelling of the face, lips, or tongue  breathing problems  hallucinations  signs and symptoms of liver injury like dark yellow or brown urine; general ill feeling or flu-like symptoms; light-colored stools; loss of appetite; nausea; right upper quadrant belly pain; unusually weak or tired; yellowing of the  eyes or skin  signs and symptoms of low blood pressure like dizziness; feeling faint or lightheaded, falls; unusually weak or tired  unusually slow heartbeat  unusually weak or tired Side effects that usually do not require medical attention (report to your doctor or health care professional if they continue or are bothersome):  blurred vision  constipation  dizziness  dry mouth  tiredness This list may not describe all possible side effects. Call your doctor for medical advice about side effects. You may report side effects to FDA at 1-800-FDA-1088. Where should I keep my medicine? Keep out of the reach of children. Store at room temperature between 15 and 30 degrees C (59 and 86 degrees F). Throw away any unused medicine after the expiration date. NOTE: This sheet is a summary. It may not cover all possible information. If you have questions about this medicine, talk to your doctor, pharmacist, or health care provider.  2020 Elsevier/Gold Standard (2017-03-10 13:33:29)

## 2019-01-04 ENCOUNTER — Other Ambulatory Visit: Payer: Self-pay | Admitting: Neurology

## 2019-01-04 DIAGNOSIS — R519 Headache, unspecified: Secondary | ICD-10-CM

## 2019-01-04 DIAGNOSIS — R51 Headache with orthostatic component, not elsewhere classified: Secondary | ICD-10-CM

## 2019-01-04 DIAGNOSIS — M542 Cervicalgia: Secondary | ICD-10-CM

## 2019-01-04 DIAGNOSIS — G379 Demyelinating disease of central nervous system, unspecified: Secondary | ICD-10-CM

## 2019-01-05 ENCOUNTER — Telehealth: Payer: Self-pay | Admitting: Neurology

## 2019-01-05 NOTE — Telephone Encounter (Signed)
[facility] pending faxed notes  

## 2019-01-06 ENCOUNTER — Encounter: Payer: Self-pay | Admitting: Family Medicine

## 2019-01-06 NOTE — Telephone Encounter (Signed)
no to the covid-19 questions MR Brain w/wo contrast & MR Cervicla spine w/wo contrast Dr. Lianne Bushy Focus Auth: [id number] (exp. 01/10/19 to 02/07/19). Patient is scheduled at [facility] for 01/12/19.  Patient also informed me she is claustrophic and would like something to help her. She is aware to have a driver.

## 2019-01-07 ENCOUNTER — Other Ambulatory Visit: Payer: Self-pay | Admitting: Neurology

## 2019-01-07 MED ORDER — ALPRAZOLAM 0.25 MG PO TABS: ORAL_TABLET | ORAL | 0 refills | Status: DC

## 2019-01-07 MED FILL — GABAPENTIN 300 MG CAPSULE: 300 | 75 days supply | Qty: 300 | Fill #0

## 2019-01-07 NOTE — Telephone Encounter (Signed)
I sent her in xana, please let her know thanks

## 2019-01-09 ENCOUNTER — Other Ambulatory Visit: Payer: Self-pay | Admitting: Neurology

## 2019-01-09 MED ORDER — GABAPENTIN 300 MG PO CAPS: ORAL_CAPSULE | ORAL | 2 refills | Status: DC

## 2019-01-10 NOTE — Telephone Encounter (Signed)
Spoke with patient and informed her Dr Jaynee Eagles sent in Rx for Xanax. She stated she picked it up Fri, verbalized understanding, appreciation.

## 2019-01-12 ENCOUNTER — Encounter: Payer: Self-pay | Admitting: Physical Medicine and Rehabilitation

## 2019-01-12 ENCOUNTER — Other Ambulatory Visit: Payer: Self-pay

## 2019-01-12 ENCOUNTER — Ambulatory Visit (INDEPENDENT_AMBULATORY_CARE_PROVIDER_SITE_OTHER): Payer: No Typology Code available for payment source | Admitting: Physical Medicine and Rehabilitation

## 2019-01-12 ENCOUNTER — Ambulatory Visit: Payer: No Typology Code available for payment source

## 2019-01-12 VITALS — BP 151/103 | HR 53 | Ht 64.0 in | Wt 199.0 lb

## 2019-01-12 DIAGNOSIS — M5481 Occipital neuralgia: Secondary | ICD-10-CM

## 2019-01-12 DIAGNOSIS — M542 Cervicalgia: Secondary | ICD-10-CM | POA: Diagnosis not present

## 2019-01-12 DIAGNOSIS — G379 Demyelinating disease of central nervous system, unspecified: Secondary | ICD-10-CM

## 2019-01-12 DIAGNOSIS — M797 Fibromyalgia: Secondary | ICD-10-CM | POA: Diagnosis not present

## 2019-01-12 DIAGNOSIS — R51 Headache with orthostatic component, not elsewhere classified: Secondary | ICD-10-CM

## 2019-01-12 DIAGNOSIS — R519 Headache, unspecified: Secondary | ICD-10-CM

## 2019-01-12 DIAGNOSIS — M7918 Myalgia, other site: Secondary | ICD-10-CM | POA: Diagnosis not present

## 2019-01-12 MED ORDER — GADOBENATE DIMEGLUMINE 529 MG/ML IV SOLN
19.0000 mL | Freq: Once | INTRAVENOUS | Status: AC | PRN
  Administered 2019-01-12: 19 mL via INTRAVENOUS

## 2019-01-12 NOTE — Progress Notes (Signed)
 .  Numeric Pain Rating Scale and Functional Assessment Average Pain 7 Pain Right Now 8 My pain is constant and dull Pain is worse with: unsure Pain improves with: therapy/exercise and TENS   In the last MONTH (on 0-10 scale) has pain interfered with the following?  1. General activity like being  able to carry out your everyday physical activities such as walking, climbing stairs, carrying groceries, or moving a chair?  Rating(8)  2. Relation with others like being able to carry out your usual social activities and roles such as  activities at home, at work and in your community. Rating(8)  3. Enjoyment of life such that you have  been bothered by emotional problems such as feeling anxious, depressed or irritable?  Rating(8)

## 2019-01-13 ENCOUNTER — Encounter: Payer: Self-pay | Admitting: Physical Medicine and Rehabilitation

## 2019-01-13 NOTE — Progress Notes (Signed)
Stacy Woods - 53 y.o. female MRN [id number]  Date of birth: 1966-04-13  Office Visit Note: Visit Date: 01/12/2019 PCP: Gregor Hams, MD Referred by: Gregor Hams, MD  Subjective: Chief Complaint  Patient presents with   Head - Pain   Neck - Pain   HPI: Stacy Woods is a 53 y.o. female who comes in today At the request of Dr. Sarina Ill for evaluation management of upper cervical and occipital pain.  Patient was originally seen by Dr. Leta Baptist for history of probable or possible hypoglycemic seizure and sometime in May or June began having pretty severe occipital headache.  No history of prior migraine headache.  She was then seen by Dr. Jaynee Eagles for headache evaluation.  Occipital neuralgia had been diagnosed by her primary care physician.  She has had occipital nerve blocks performed by Dr. Jaynee Eagles with temporary relief but good relief for a few days.  Diagnostically she admits that it did give her quite a bit of relief for a few days.  She has not had any other interventions of the cervical spine.  She has a complicated course of fibromyalgia with neck and low back pain and musculoskeletal pain in general.  She has been undergoing physical therapy and [place] through [employer].  She has had dry needling there with some relief but again temporary.  She has had negative MRI of the brain and has upcoming MRI of the cervical spine ordered.  She has had treatment of her pain with some medication trials.  She is intolerant of meloxicam and Lyrica and opioid.  She reports her average neck and headache pain is a 7 out of 10.  She reports it is constant and dull.  She gets a little bit improvement with the therapy and exercises and TENS unit.  Massage seems to help as well.  She has had no vision changes she has had no radicular pain in the arms or tingling.  She has had prior electrodiagnostic studies of both lower limbs not showing any peripheral neuropathy.  Interestingly it did show some  meralgia paresthetica.  The patient is obese and has had some bariatric treatment.  She has been diagnosed as prediabetic.  She has not had any Botox injections.  Interestingly she also endorses with side to side bending of the neck hearing a clicking in her ears and that is been going on now at the same time that her pain is been involved.  We see generalized sensation of cracking and popping with a lot of neck pain patients and it tends to be more related to tight musculature and almost what I would refer to a snapping of tendons back and forth.  We see this likely someone cracking her knuckles.  It is a very common finding with neck pain complaints at least to my office.  However she does seem to relate this more to the ears and not the neck.  Review of Systems  Constitutional: Negative for chills, fever, malaise/fatigue and weight loss.  HENT: Negative for hearing loss and sinus pain.   Eyes: Negative for blurred vision, double vision and photophobia.  Respiratory: Negative for cough and shortness of breath.   Cardiovascular: Negative for chest pain, palpitations and leg swelling.  Gastrointestinal: Negative for abdominal pain, nausea and vomiting.  Genitourinary: Negative for flank pain.  Musculoskeletal: Positive for back pain, joint pain and neck pain. Negative for myalgias.  Skin: Negative for itching and rash.  Neurological: Negative for tremors,  focal weakness and weakness.  Endo/Heme/Allergies: Negative.   Psychiatric/Behavioral: Negative for depression.  All other systems reviewed and are negative.  Otherwise per HPI.  Assessment & Plan: Visit Diagnoses:  1. Cervicalgia   2. Bilateral occipital neuralgia   3. Fibromyalgia   4. Myofascial pain syndrome     Plan: Findings:  Upper cervical pain with occipital headache that mostly stays occipital but does radiate some over the top of the head.  This is in the setting of normal brain MRI and history of underlying fibromyalgia.  Exam  is consistent with some myofascial pain syndrome and trigger points as well.  She is had some dry needling with some success but temporarily.  Bilateral occipital nerve blocks did give her a decent amount of relief for short while.  She continues to see Dr. Jaynee Eagles.  MRI is actually scheduled for later today.  We did try to call the patient to delay her appointment so we can see her after the MRI but she had already made her way here from a little bit of a distance I think it was wise to go ahead and see her today but I do want withhold a lot of treatment till we see exactly what the MRI looks like.  I told her I give her a call when we see the results of that.  I do think that she would be a candidate for bilateral upper cervical medial branch blocks and we went over that at great length with details on the models and imaging.  We discussed that if she did well with that it could be radiofrequency ablation and we discussed that at length.  I will ask her that she can go and google the phrases and words I gave her.  She seems inclined to try it if that seem like it would help.  Also encouraged her to continue to follow-up with Dr. Lavell Anchors for treatment of her cervical and headache pain.  Botox may be something to look at.  Obviously can neglect the underlying fibromyalgia in terms of exacerbation of experienced symptoms.  In that regard exercise and sleep hygiene are paramount.  In terms of the clicking with side rotation or side bending of the neck and experiencing is more in the ears if she is concerned about this I would recommend referral to an ENT physician.  Lastly one thought would be in terms of treatment regrouping with a different physical therapist potentially just to get a different technique for dry needling.    Meds & Orders: No orders of the defined types were placed in this encounter.  No orders of the defined types were placed in this encounter.   Follow-up: Return for MRI review after  completion.   Procedures: No procedures performed  No notes on file   Clinical History: No specialty comments available.   She reports that she has never smoked. She has never used smokeless tobacco.  Recent Labs    07/05/18 0841  HGBA1C 5.3    Objective:  VS:  HT:5\' 4"  (162.6 cm)    WT:199 lb (90.3 kg)   BMI:34.14     BP:(!) 151/103   HR:(!) 53bpm   TEMP: ( )   RESP:  Physical Exam Vitals signs and nursing note reviewed.  Constitutional:      General: She is not in acute distress.    Appearance: Normal appearance. She is well-developed. She is obese.  HENT:     Head: Normocephalic and atraumatic.  Nose: Nose normal.     Mouth/Throat:     Mouth: Mucous membranes are moist.     Pharynx: Oropharynx is clear.  Eyes:     Conjunctiva/sclera: Conjunctivae normal.     Pupils: Pupils are equal, round, and reactive to light.  Neck:     Musculoskeletal: Normal range of motion and neck supple. Muscular tenderness present. No neck rigidity.  Cardiovascular:     Rate and Rhythm: Regular rhythm.  Pulmonary:     Effort: Pulmonary effort is normal. No respiratory distress.  Abdominal:     General: There is no distension.     Palpations: Abdomen is soft.     Tenderness: There is no guarding.  Musculoskeletal:     Right lower leg: No edema.     Left lower leg: No edema.     Comments: Patient sits with somewhat forward flexed cervical spine she does have pain at end ranges of rotation.  With any active movement she subjectively hears clicking in her ears.  On exam of the cervical spine there is tenderness to palpation lightly but there is also evidence of focal trigger points in the splenius capitis muscles.  She has trigger points in the trapezius and levator scapula and rhomboids.  These are not huge but there is definitely tight bands there.  No shoulder impingement.  Good strength in the upper extremities bilaterally and negative Hoffmann's test bilaterally.  Skin:    General: Skin  is warm and dry.     Findings: No erythema or rash.  Neurological:     General: No focal deficit present.     Mental Status: She is alert and oriented to person, place, and time.     Motor: No abnormal muscle tone.     Coordination: Coordination normal.     Gait: Gait normal.  Psychiatric:        Mood and Affect: Mood normal.        Behavior: Behavior normal.        Thought Content: Thought content normal.     Ortho Exam Imaging: No results found.  Past Medical/Family/Surgical/Social History: Medications & Allergies reviewed per EMR, new medications updated. Patient Active Problem List   Diagnosis Date Noted   Transaminitis 12/01/2017   MDD (recurrent major depressive disorder) in remission ([place]) 11/03/2017   Insomnia 08/14/2017   AK (actinic keratosis) 07/14/2017   Low magnesium level 01/12/2017   B12 deficiency 01/01/2017   Trochanteric bursitis of left hip 10/08/2016   Malabsorption of iron 05/09/2016   History of gastric bypass 05/09/2016   Arthritis of knee, degenerative 04/14/2016   Surgical menopause 03/24/2016   Family history of breast cancer 03/24/2016   Left knee pain 10/01/2015   Hypoglycemia 06/12/2015   Iron deficiency anemia 05/02/2015   Cubital tunnel syndrome on right 09/14/2014   Degenerative disc disease, cervical 07/27/2014   Prediabetes 09/01/2013   Fibromyalgia 04/11/2013   Essential hypertension, benign 04/11/2013   Anxiety state 04/11/2013   Past Medical History:  Diagnosis Date   AK (actinic keratosis) 07/14/2017   Right upper chest wall   Anemia    Anemia, iron deficiency 05/02/2015   Fibromyalgia    History of gastric bypass 05/09/2016   Hypertension    Insomnia 08/14/2017   Malabsorption of iron 05/09/2016   PONV (postoperative nausea and vomiting)    Seizure ([place]) 2018   from low blood sugar    Surgical menopause 03/24/2016   Family History  Problem Relation Age of Onset  Diabetes Father     Hypertension Father    Stroke Father    Heart failure Father    CAD Father        has stents   Breast cancer Mother    Migraines Sister    Colon cancer Neg Hx    Colon polyps Neg Hx    Esophageal cancer Neg Hx    Stomach cancer Neg Hx    Rectal cancer Neg Hx    Past Surgical History:  Procedure Laterality Date   ABDOMINAL HYSTERECTOMY  1997   APPENDECTOMY     CESAREAN SECTION  [phone]   CHOLECYSTECTOMY     GASTRIC BYPASS  2011   KNEE ARTHROSCOPY  2002   TONSILLECTOMY  1977   ULNAR NERVE TRANSPOSITION Right 06/12/2015   Procedure: RIGHT CUBITAL TUNNEL RELEASE;  Surgeon: Milly Jakob, MD;  Location: [place];  Service: Orthopedics;  Laterality: Right;   Social History   Occupational History   Occupation: [employer]  Tobacco Use   Smoking status: Never Smoker   Smokeless tobacco: Never Used  Substance and Sexual Activity   Alcohol use: No   Drug use: No   Sexual activity: Never    Birth control/protection: Surgical

## 2019-01-18 ENCOUNTER — Telehealth: Payer: Self-pay | Admitting: Physical Medicine and Rehabilitation

## 2019-01-18 NOTE — Telephone Encounter (Signed)
I had looked at [facility] but was waiting on them to dictate. [place] for bilateral C2-3 Facet, consider different PT for dry needling

## 2019-01-18 NOTE — Telephone Encounter (Signed)
Is auth needed? Patient has not been scheduled.

## 2019-01-19 NOTE — Telephone Encounter (Signed)
Pt is scheduled for 02/03/2019 with driver and no BTs

## 2019-01-19 NOTE — Telephone Encounter (Signed)
Spoke with April Manson from pt insurance and she states no PA is needed for [phone]. [id number]

## 2019-02-03 ENCOUNTER — Encounter: Payer: Self-pay | Admitting: Physical Medicine and Rehabilitation

## 2019-02-03 ENCOUNTER — Ambulatory Visit (INDEPENDENT_AMBULATORY_CARE_PROVIDER_SITE_OTHER): Payer: No Typology Code available for payment source | Admitting: Physical Medicine and Rehabilitation

## 2019-02-03 ENCOUNTER — Ambulatory Visit: Payer: Self-pay

## 2019-02-03 VITALS — BP 160/109 | HR 82

## 2019-02-03 DIAGNOSIS — M542 Cervicalgia: Secondary | ICD-10-CM | POA: Diagnosis not present

## 2019-02-03 DIAGNOSIS — M5481 Occipital neuralgia: Secondary | ICD-10-CM | POA: Diagnosis not present

## 2019-02-03 MED ORDER — BUPIVACAINE HCL 0.25 % IJ SOLN
2.0000 mL | Freq: Once | INTRAMUSCULAR | Status: AC
  Administered 2019-02-03: 2 mL

## 2019-02-03 MED ORDER — METHYLPREDNISOLONE ACETATE 80 MG/ML IJ SUSP
80.0000 mg | Freq: Once | INTRAMUSCULAR | Status: AC
  Administered 2019-02-03: 80 mg

## 2019-02-03 MED FILL — ZOLPIDEM TARTRATE 5 MG TAB: 5 | 30 days supply | Qty: 30 | Fill #5

## 2019-02-03 NOTE — Progress Notes (Signed)
 .  Numeric Pain Rating Scale and Functional Assessment Average Pain 8   In the last MONTH (on 0-10 scale) has pain interfered with the following?  1. General activity like being  able to carry out your everyday physical activities such as walking, climbing stairs, carrying groceries, or moving a chair?  Rating(8)   +Driver, -BT, -Dye Allergies.  

## 2019-02-04 ENCOUNTER — Other Ambulatory Visit: Payer: Self-pay

## 2019-02-04 ENCOUNTER — Encounter: Payer: Self-pay | Admitting: Physical Medicine and Rehabilitation

## 2019-02-04 ENCOUNTER — Ambulatory Visit (INDEPENDENT_AMBULATORY_CARE_PROVIDER_SITE_OTHER): Payer: No Typology Code available for payment source | Admitting: Family Medicine

## 2019-02-04 DIAGNOSIS — Z23 Encounter for immunization: Secondary | ICD-10-CM

## 2019-02-08 NOTE — Procedures (Signed)
Diagnostic Cervical Facet Joint Nerve Block with Fluoroscopic Guidance  Patient: Stacy Woods      Date of Birth: 1966/05/26 MRN: [id number] PCP: Gregor Hams, MD      Visit Date: 02/03/2019   Universal Protocol:    Date/Time: 02/07/2009:48 AM  Consent Given By: the patient  Position: PRONE  Additional Comments: Vital signs were monitored before and after the procedure. Patient was prepped and draped in the usual sterile fashion. The correct patient, procedure, and site was verified.   Injection Procedure Details:  Procedure Site One Meds Administered:  Meds ordered this encounter  Medications  . bupivacaine (MARCAINE) 0.25 % (with pres) injection 2 mL  . methylPREDNISolone acetate (DEPO-MEDROL) injection 80 mg     Laterality: Bilateral  Location/Site:  C2-3  Needle size: 25 G  Needle type: Spinal  Needle Placement: Articular Pillar  Findings:  -Contrast Used: 0.5 mL iohexol 180 mg iodine/mL   -Comments: Excellent flow contrast over the articular pillars without any intravascular flow.  Procedure Details: The fluoroscope beam was positioned to square off the endplates of the desired vertebral level to achieve a true AP position. The beam was then moved in a small "counter" oblique to the contralateral side with a small amount of caudal tilt to achieve a trajectory alignment with the desired nerves.  For each target described below the skin was anesthetized with 1 ml of 1% Lidocaine without epinephrine.   To block the facet joint nerve to C2, the needle was fluoroscopically positioned over the inferior lateral portion of the C2/3 facet joint nerve where the third occipital nerve (TON) lies.  After contact with periosteum and negative aspirate for blood and CSF, correct placement without intravascular or epidural spread was confirmed by Bi-planar images and  injecting 0.5 ml. of Omnipaque-240.  A spot radiograph was obtained of this image.  Next, a 0.5 ml. volume of 1%  Lidocaine without Epinephrine was then injected.  Prior to the procedure, the patient was given a Pain Diary which was completed for baseline measurements.  After the procedure, the patient rated their pain every 30 minutes and will continue rating at this frequency for a total of 5 hours.  The patient has been asked to complete the Diary and return to [place] by mail, fax or hand delivered as soon as possible.   Additional Comments:  The patient tolerated the procedure well Dressing: Band-Aid    Post-procedure details: Patient was observed during the procedure. Post-procedure instructions were reviewed.  Patient left the clinic in stable condition.

## 2019-02-08 NOTE — Addendum Note (Signed)
Addended by: Raymondo Band on: 02/08/2019 10:54 AM   Modules accepted: Orders

## 2019-02-08 NOTE — Progress Notes (Signed)
[name] - 53 y.o. female MRN [id number]  Date of birth: 07/15/1965  Office Visit Note: Visit Date: 02/03/2019 PCP: Gregor Hams, MD Referred by: Gregor Hams, MD  Subjective: Chief Complaint  Patient presents with  . Neck - Pain  . Right Shoulder - Pain  . Left Shoulder - Pain  . Head - Pain   HPI:  Stacy Woods is a 53 y.o. female who comes in today For planned bilateral C2-3 facet joint blocks for upper cervical spine pain as well as cervicogenic type headache.  Patient was given pain diary today to see diagnostically if there is associated pain relief today. Small amount of cortisone was utilized.  This would be diagnostic block #1 in a double block paradigm.  If patient is successful with both of these she would meet criteria for radiofrequency ablation for more definitive longer-term relief.  Please see our prior evaluation and management note for further details and justification.  ROS Otherwise per HPI.  Assessment & Plan: Visit Diagnoses:  1. Cervicalgia   2. Bilateral occipital neuralgia     Plan: No additional findings.   Meds & Orders:  Meds ordered this encounter  Medications  . bupivacaine (MARCAINE) 0.25 % (with pres) injection 2 mL  . methylPREDNISolone acetate (DEPO-MEDROL) injection 80 mg    Orders Placed This Encounter  Procedures  . Facet Injection  . XR C-ARM NO REPORT    Follow-up: Return if symptoms worsen or fail to improve, for Review Pain Diary.   Procedures: No procedures performed  Diagnostic Cervical Facet Joint Nerve Block with Fluoroscopic Guidance  Patient: Stacy Woods      Date of Birth: 16-Sep-1965 MRN: [id number] PCP: Gregor Hams, MD      Visit Date: 02/03/2019   Universal Protocol:    Date/Time: 02/07/2009:48 AM  Consent Given By: the patient  Position: PRONE  Additional Comments: Vital signs were monitored before and after the procedure. Patient was prepped and draped in the usual sterile fashion. The correct  patient, procedure, and site was verified.   Injection Procedure Details:  Procedure Site One Meds Administered:  Meds ordered this encounter  Medications  . bupivacaine (MARCAINE) 0.25 % (with pres) injection 2 mL  . methylPREDNISolone acetate (DEPO-MEDROL) injection 80 mg     Laterality: Bilateral  Location/Site:  C2-3  Needle size: 25 G  Needle type: Spinal  Needle Placement: Articular Pillar  Findings:  -Contrast Used: 0.5 mL iohexol 180 mg iodine/mL   -Comments: Excellent flow contrast over the articular pillars without any intravascular flow.  Procedure Details: The fluoroscope beam was positioned to square off the endplates of the desired vertebral level to achieve a true AP position. The beam was then moved in a small "counter" oblique to the contralateral side with a small amount of caudal tilt to achieve a trajectory alignment with the desired nerves.  For each target described below the skin was anesthetized with 1 ml of 1% Lidocaine without epinephrine.   To block the facet joint nerve to C2, the needle was fluoroscopically positioned over the inferior lateral portion of the C2/3 facet joint nerve where the third occipital nerve (TON) lies.  After contact with periosteum and negative aspirate for blood and CSF, correct placement without intravascular or epidural spread was confirmed by Bi-planar images and  injecting 0.5 ml. of Omnipaque-240.  A spot radiograph was obtained of this image.  Next, a 0.5 ml. volume of 1% Lidocaine without Epinephrine was then  injected.  Prior to the procedure, the patient was given a Pain Diary which was completed for baseline measurements.  After the procedure, the patient rated their pain every 30 minutes and will continue rating at this frequency for a total of 5 hours.  The patient has been asked to complete the Diary and return to [place] by mail, fax or hand delivered as soon as possible.   Additional Comments:  The patient tolerated  the procedure well Dressing: Band-Aid    Post-procedure details: Patient was observed during the procedure. Post-procedure instructions were reviewed.  Patient left the clinic in stable condition.      Clinical History: No specialty comments available.     Objective:  VS:  HT:    WT:   BMI:     BP:(!) 160/109  HR:82bpm  TEMP: ( )  RESP:  Physical Exam  Ortho Exam Imaging: No results found.

## 2019-02-22 ENCOUNTER — Encounter: Payer: Self-pay | Admitting: Physical Medicine and Rehabilitation

## 2019-02-22 ENCOUNTER — Ambulatory Visit: Payer: Self-pay

## 2019-02-22 ENCOUNTER — Other Ambulatory Visit: Payer: Self-pay

## 2019-02-22 ENCOUNTER — Ambulatory Visit (INDEPENDENT_AMBULATORY_CARE_PROVIDER_SITE_OTHER): Payer: No Typology Code available for payment source | Admitting: Physical Medicine and Rehabilitation

## 2019-02-22 VITALS — BP 150/115 | HR 66

## 2019-02-22 DIAGNOSIS — M47812 Spondylosis without myelopathy or radiculopathy, cervical region: Secondary | ICD-10-CM | POA: Diagnosis not present

## 2019-02-22 MED ORDER — BUPIVACAINE HCL 0.25 % IJ SOLN
2.0000 mL | Freq: Once | INTRAMUSCULAR | Status: AC
  Administered 2019-02-22: 2 mL

## 2019-02-22 MED ORDER — METHYLPREDNISOLONE ACETATE 80 MG/ML IJ SUSP
80.0000 mg | Freq: Once | INTRAMUSCULAR | Status: AC
  Administered 2019-02-22: 80 mg

## 2019-02-22 NOTE — Progress Notes (Signed)
 .  Numeric Pain Rating Scale and Functional Assessment Average Pain 8   In the last MONTH (on 0-10 scale) has pain interfered with the following?  1. General activity like being  able to carry out your everyday physical activities such as walking, climbing stairs, carrying groceries, or moving a chair?  Rating(8)   +Driver, -BT, -Dye Allergies.  

## 2019-02-22 NOTE — Progress Notes (Signed)
Stacy Woods - 53 y.o. female MRN [id number]  Date of birth: 30-Jun-1965  Office Visit Note: Visit Date: 02/22/2019 PCP: Gregor Hams, MD Referred by: Gregor Hams, MD  Subjective: Chief Complaint  Patient presents with  . Head - Pain  . Neck - Pain   HPI: Stacy Woods is a 53 y.o. female who comes in today For planned second diagnostic C2-3 facet joint/medial branch block.  Patient continues to have upper cervical neck pain and posterior headache pain.  Prior injection actually gave her a tremendous amount relief.  She had close to 100% relief for a while and this was documented on pain diary which we do have.  She has had no new symptoms or problems since that time.  Depending on relief with this we would look at radiofrequency ablation for more definitive treatment.  She has had physical therapy and medication management without relief.  This is been an ongoing chronic problem.  Please see our prior notes for further details and justification.  Exam is consistent with facet mediated pain pain with extension rotation of the cervical spine and facet loading.  No focal trigger points in the upper cervical spine some trigger points in the trapezius and levator scapula.  ROS Otherwise per HPI.  Assessment & Plan: Visit Diagnoses:  1. Cervical spondylosis without myelopathy     Plan: No additional findings.   Meds & Orders:  Meds ordered this encounter  Medications  . methylPREDNISolone acetate (DEPO-MEDROL) injection 80 mg  . bupivacaine (MARCAINE) 0.25 % (with pres) injection 2 mL    Orders Placed This Encounter  Procedures  . Facet Injection  . XR C-ARM NO REPORT    Follow-up: Return if symptoms worsen or fail to improve, for Review Pain Diary.   Procedures: No procedures performed  Diagnostic Cervical Facet Joint Nerve Block with Fluoroscopic Guidance  Patient: Stacy Woods      Date of Birth: [birth year] MRN: [id number] PCP: Gregor Hams, MD      Visit Date: 02/22/2019    Universal Protocol:    Date/Time: 10/04/205:38 PM  Consent Given By: the patient  Position: PRONE  Additional Comments: Vital signs were monitored before and after the procedure. Patient was prepped and draped in the usual sterile fashion. The correct patient, procedure, and site was verified.   Injection Procedure Details:  Procedure Site One Meds Administered:  Meds ordered this encounter  Medications  . methylPREDNISolone acetate (DEPO-MEDROL) injection 80 mg  . bupivacaine (MARCAINE) 0.25 % (with pres) injection 2 mL     Laterality: Bilateral  Location/Site:  C2-3  Needle size: 25 G  Needle type: Spinal  Needle Placement: Articular Pillar  Findings:  -Contrast Used: 0.5 mL iohexol 180 mg iodine/mL   -Comments: Excellent flow of contrast over the articular pillar and facet joint without intravascular flow  Procedure Details: The fluoroscope beam was positioned to square off the endplates of the desired vertebral level to achieve a true AP position. The beam was then moved in a small "counter" oblique to the contralateral side with a small amount of caudal tilt to achieve a trajectory alignment with the desired nerves.  For each target described below the skin was anesthetized with 1 ml of 1% Lidocaine without epinephrine.   To block the facet joint nerve to C2, the needle was fluoroscopically positioned over the inferior lateral portion of the C2/3 facet joint nerve where the third occipital nerve (TON) lies.   After contact with  periosteum and negative aspirate for blood and CSF, correct placement without intravascular or epidural spread was confirmed by Bi-planar images and  injecting 0.5 ml. of Omnipaque-240.  A spot radiograph was obtained of this image.  Next, a 0.5 ml. volume of 1% Lidocaine without Epinephrine was then injected.  Prior to the procedure, the patient was given a Pain Diary which was completed for baseline measurements.  After the procedure,  the patient rated their pain every 30 minutes and will continue rating at this frequency for a total of 5 hours.  The patient has been asked to complete the Diary and return to [place] by mail, fax or hand delivered as soon as possible.   Additional Comments:  The patient tolerated the procedure well Dressing: Band-Aid    Post-procedure details: Patient was observed during the procedure. Post-procedure instructions were reviewed.  Patient left the clinic in stable condition.      Clinical History: No specialty comments available.   She reports that she has never smoked. She has never used smokeless tobacco.  Recent Labs    07/05/18 0841  HGBA1C 5.3    Objective:  VS:  HT:    WT:   BMI:     BP:(!) 150/115  HR:66bpm  TEMP: ( )  RESP:  Physical Exam  Ortho Exam Imaging: No results found.  Past Medical/Family/Surgical/Social History: Medications & Allergies reviewed per EMR, new medications updated. Patient Active Problem List   Diagnosis Date Noted  . Transaminitis 12/01/2017  . MDD (recurrent major depressive disorder) in remission ([place]) 11/03/2017  . Insomnia 08/14/2017  . AK (actinic keratosis) 07/14/2017  . Low magnesium level 01/12/2017  . B12 deficiency 01/01/2017  . Trochanteric bursitis of left hip 10/08/2016  . Malabsorption of iron 05/09/2016  . History of gastric bypass 05/09/2016  . Arthritis of knee, degenerative 04/14/2016  . Surgical menopause 03/24/2016  . Family history of breast cancer 03/24/2016  . Left knee pain 10/01/2015  . Hypoglycemia 06/12/2015  . Iron deficiency anemia 05/02/2015  . Cubital tunnel syndrome on right 09/14/2014  . Degenerative disc disease, cervical 07/27/2014  . Prediabetes 09/01/2013  . Fibromyalgia 04/11/2013  . Essential hypertension, benign 04/11/2013  . Anxiety state 04/11/2013   Past Medical History:  Diagnosis Date  . AK (actinic keratosis) 07/14/2017   Right upper chest wall  . Anemia   . Anemia, iron  deficiency 05/02/2015  . Fibromyalgia   . History of gastric bypass 05/09/2016  . Hypertension   . Insomnia 08/14/2017  . Malabsorption of iron 05/09/2016  . PONV (postoperative nausea and vomiting)   . Seizure ([place]) 2018   from low blood sugar   . Surgical menopause 03/24/2016   Family History  Problem Relation Age of Onset  . Diabetes Father   . Hypertension Father   . Stroke Father   . Heart failure Father   . CAD Father        has stents  . Breast cancer Mother   . Migraines Sister   . Colon cancer Neg Hx   . Colon polyps Neg Hx   . Esophageal cancer Neg Hx   . Stomach cancer Neg Hx   . Rectal cancer Neg Hx    Past Surgical History:  Procedure Laterality Date  . ABDOMINAL HYSTERECTOMY  1997  . APPENDECTOMY    . CESAREAN SECTION  [id number]  . CHOLECYSTECTOMY    . GASTRIC BYPASS  2011  . KNEE ARTHROSCOPY  2002  . TONSILLECTOMY  1977  .  ULNAR NERVE TRANSPOSITION Right 06/12/2015   Procedure: RIGHT CUBITAL TUNNEL RELEASE;  Surgeon: Milly Jakob, MD;  Location: [facility];  Service: Orthopedics;  Laterality: Right;   Social History   Occupational History  . Occupation: [employer]  Tobacco Use  . Smoking status: Never Smoker  . Smokeless tobacco: Never Used  Substance and Sexual Activity  . Alcohol use: No  . Drug use: No  . Sexual activity: Never    Birth control/protection: Surgical

## 2019-02-23 ENCOUNTER — Encounter: Payer: Self-pay | Admitting: Family Medicine

## 2019-02-24 ENCOUNTER — Ambulatory Visit: Payer: No Typology Code available for payment source | Admitting: Family Medicine

## 2019-02-25 ENCOUNTER — Telehealth: Payer: Self-pay | Admitting: *Deleted

## 2019-02-25 NOTE — Telephone Encounter (Signed)
Sent fax for PA to pt insurance [employer]. Case is pending.

## 2019-02-28 ENCOUNTER — Encounter: Payer: Self-pay | Admitting: Physical Medicine and Rehabilitation

## 2019-03-01 ENCOUNTER — Other Ambulatory Visit: Payer: Self-pay

## 2019-03-01 ENCOUNTER — Encounter: Payer: Self-pay | Admitting: Family Medicine

## 2019-03-01 ENCOUNTER — Ambulatory Visit (INDEPENDENT_AMBULATORY_CARE_PROVIDER_SITE_OTHER): Payer: No Typology Code available for payment source | Admitting: Family Medicine

## 2019-03-01 ENCOUNTER — Other Ambulatory Visit: Payer: Self-pay | Admitting: Family Medicine

## 2019-03-01 VITALS — BP 125/89 | HR 67 | Temp 98.4°F | Wt 197.0 lb

## 2019-03-01 DIAGNOSIS — I10 Essential (primary) hypertension: Secondary | ICD-10-CM

## 2019-03-01 MED ORDER — LISINOPRIL 10 MG PO TABS: 10.0000 mg | ORAL_TABLET | Freq: Every day | ORAL | 3 refills | Status: DC

## 2019-03-01 MED FILL — ESCITALOPRAM 10 MG TABLET: 10 | 90 days supply | Qty: 90 | Fill #2

## 2019-03-01 MED FILL — LISINOPRIL 10 MG TABS: 10 | 90 days supply | Qty: 90 | Fill #0

## 2019-03-01 NOTE — Patient Instructions (Addendum)
Thank you for coming in today.  Start lisinopril 10mg  daily. Ok to take in the morning or at bedtime.  Keep track of blood pressure at home.  Goal is 120/70 or less.  If really low in very low 100s or less let me know and split the dose in half.   Keep me updated.    Send me home BP log.    I will be moving to full time Sports Medicine in [place] starting on November 1st.  You will still be able to see me for your Sports Medicine or Orthopedic needs at [facility] in [place]. I will still be part of [facility].    If you want to stay locally for your Sports Medicine issues Dr. Dianah Field here in [place] will be happy to see you.  Additionally Dr. Clearance Coots at [facility] will be happy to see you for sports medicine issues more locally.   For your primary care needs you are welcome to establish care with Dr. Emeterio Reeve.  We are working quickly to hire more physicians to cover the primary care needs however if you cannot get an appointment with Dr. Sheppard Coil in a timely manner [place] has locations and openings for primary care services nearby.   [facility] at [facility] [address] . [place] , [place]: [phone] . Behavioral Medicine: [phone] . Fax: [place] at [employer] [address] . [place], [place]: [phone] . Behavioral Medicine: [phone] . Fax: [phone] . Hours (M-F): 7am - Academic librarian At [facility]. [place], [place]: [phone] . Behavioral Medicine: [phone] . Fax: [phone] . Hours (M-F): 8am - Optician, dispensing at [employer] . [place], [place] Phone: [phone] . Behavioral Medicine: [phone] . Fax: [phone]

## 2019-03-01 NOTE — Progress Notes (Signed)
Stacy Woods is a 53 y.o. female who presents to [facility]: Primary Care Sports Medicine today for hypertension.  Patient is in the process of getting treatment for her occipital neuralgia with injections.  These have worked pretty well but did not last long.  She is scheduled for ablation in the near future.  However she notes that during these procedures her blood pressure is elevated.  Is been 940H to 680 systolic.  She is had blood pressure checks as well outside of these procedures were is been high.  She does have a blood pressure cuff at home but has not used it recently.  She denies chest pain palpitation shortness of breath lightheadedness or dizziness. In the past she did have hypertension it was managed with lisinopril and with hydrochlorothiazide.  She tolerated these medications well.    ROS as above:  Exam:  BP 125/89   Pulse 67   Temp 98.4 F (36.9 C) (Oral)   Wt 197 lb (89.4 kg)   BMI 33.81 kg/m  Wt Readings from Last 5 Encounters:  03/01/19 197 lb (89.4 kg)  01/12/19 199 lb (90.3 kg)  12/22/18 208 lb (94.3 kg)  12/16/18 207 lb (93.9 kg)  11/11/18 200 lb (90.7 kg)    Gen: Well NAD HEENT: EOMI,  MMM Lungs: Normal work of breathing. CTABL Heart: RRR no MRG Abd: NABS, Soft. Nondistended, Nontender Exts: Brisk capillary refill, warm and well perfused.   Lab and Radiology Results   Chemistry      Component Value Date/Time   NA 142 11/11/2018 0839   NA 138 05/09/2016 1332   K 4.3 11/11/2018 0839   K 3.6 05/09/2016 1332   CL 107 11/11/2018 0839   CO2 25 11/11/2018 0839   CO2 20 (L) 05/09/2016 1332   BUN 9 11/11/2018 0839   BUN 12.2 05/09/2016 1332   CREATININE 0.85 11/11/2018 0839   CREATININE 0.9 05/09/2016 1332      Component Value Date/Time   CALCIUM 9.6 11/11/2018 0839   CALCIUM 9.1 05/09/2016 1332   ALKPHOS 109 12/30/2016 1506   ALKPHOS 121 05/09/2016 1332   AST 30 11/11/2018 0839   AST 34 05/09/2016 1332   ALT 24 11/11/2018 0839   ALT 28 05/09/2016 1332   BILITOT 0.6 11/11/2018 0839   BILITOT 0.47 05/09/2016 1332        Assessment and Plan: 53 y.o. female with  Hypertension.  Blood pressure is a bit higher than his goal.  Is been high at previous encounters.  Plan for home blood pressure log but will go ahead and start low-dose lisinopril.  We will keep blood pressure log and adjust medication as needed.  Recheck as needed.  Labs recently checked and normal.  PDMP not reviewed this encounter. No orders of the defined types were placed in this encounter.  Meds ordered this encounter  Medications  . lisinopril (ZESTRIL) 10 MG tablet    Sig: Take 1 tablet (10 mg total) by mouth daily.    Dispense:  90 tablet    Refill:  3     Historical information moved to improve visibility of documentation.  Past Medical History:  Diagnosis Date  . AK (actinic keratosis) 07/14/2017   Right upper chest wall  . Anemia   . Anemia, iron deficiency 05/02/2015  . Fibromyalgia   . History of gastric bypass 05/09/2016  . Hypertension   . Insomnia 08/14/2017  . Malabsorption of iron 05/09/2016  .  PONV (postoperative nausea and vomiting)   . Seizure ([place]) 2018   from low blood sugar   . Surgical menopause 03/24/2016   Past Surgical History:  Procedure Laterality Date  . ABDOMINAL HYSTERECTOMY  1997  . APPENDECTOMY    . CESAREAN SECTION  [id number]  . CHOLECYSTECTOMY    . GASTRIC BYPASS  2011  . KNEE ARTHROSCOPY  2002  . TONSILLECTOMY  1977  . ULNAR NERVE TRANSPOSITION Right 06/12/2015   Procedure: RIGHT CUBITAL TUNNEL RELEASE;  Surgeon: Milly Jakob, MD;  Location: [facility];  Service: Orthopedics;  Laterality: Right;   Social History   Tobacco Use  . Smoking status: Never Smoker  . Smokeless tobacco: Never Used  Substance Use Topics  . Alcohol use: No   family history includes Breast cancer in her mother; CAD in her  father; Diabetes in her father; Heart failure in her father; Hypertension in her father; Migraines in her sister; Stroke in her father.  Medications: Current Outpatient Medications  Medication Sig Dispense Refill  . AMBULATORY NON FORMULARY MEDICATION One Touch Glucometer per insurance formulary. One month of testing strips, and lancets used to check blood sugar daily. 1 Units prn  . blood glucose meter kit and supplies KIT Check fasting blood sugar daily. 1 each 0  . escitalopram (LEXAPRO) 10 MG tablet TAKE 1 TABLET (10 MG TOTAL) BY MOUTH DAILY. 90 tablet 3  . glucagon 1 MG injection Inject 1 mg into the muscle once as needed for up to 1 dose. For low blood sugar 1 each 12  . glucose blood (IGLUCOSE TEST STRIPS) test strip Check fasting blood sugar 3-4 times daily. Dx: E16.2 100 each 11  . Lancets 30G MISC Check fasting blood sugar daily 50 each PRN  . lisinopril (ZESTRIL) 10 MG tablet Take 1 tablet (10 mg total) by mouth daily. 90 tablet 3  . tiZANidine (ZANAFLEX) 4 MG tablet Take 1 tablet (4 mg total) by mouth every 6 (six) hours as needed for muscle spasms. 90 tablet 6  . zolpidem (AMBIEN) 5 MG tablet TAKE 1 TABLET (5 MG TOTAL) BY MOUTH AT BEDTIME AS NEEDED FOR SLEEP. 30 tablet 5   Current Facility-Administered Medications  Medication Dose Route Frequency Provider Last Rate Last Dose  . bupivacaine (MARCAINE) 0.25 % (with pres) injection 2 mL  2 mL Other Once Magnus Sinning, MD      . methylPREDNISolone acetate (DEPO-MEDROL) injection 80 mg  80 mg Other Once Magnus Sinning, MD       Allergies  Allergen Reactions  . Mobic [Meloxicam] Swelling  . Shellfish Allergy Nausea Only and Swelling  . Lyrica [Pregabalin] Other (See Comments)    "zombie"  . Percocet [Oxycodone-Acetaminophen] Itching     Discussed warning signs or symptoms. Please see discharge instructions. Patient expresses understanding.

## 2019-03-02 MED FILL — ZOLPIDEM TARTRATE 5 MG TAB: 5 | 30 days supply | Qty: 30 | Fill #0

## 2019-03-03 ENCOUNTER — Telehealth: Payer: Self-pay | Admitting: *Deleted

## 2019-03-03 NOTE — Telephone Encounter (Signed)
Received PA from pt insurance Auth# [id number] Effective dates 03/02/2019-04/08/2019.

## 2019-03-13 NOTE — Procedures (Signed)
Diagnostic Cervical Facet Joint Nerve Block with Fluoroscopic Guidance  Patient: Stacy Woods      Date of Birth: 07/06/1965 MRN: [id number] PCP: Gregor Hams, MD      Visit Date: 02/22/2019   Universal Protocol:    Date/Time: 10/04/205:38 PM  Consent Given By: the patient  Position: PRONE  Additional Comments: Vital signs were monitored before and after the procedure. Patient was prepped and draped in the usual sterile fashion. The correct patient, procedure, and site was verified.   Injection Procedure Details:  Procedure Site One Meds Administered:  Meds ordered this encounter  Medications  . methylPREDNISolone acetate (DEPO-MEDROL) injection 80 mg  . bupivacaine (MARCAINE) 0.25 % (with pres) injection 2 mL     Laterality: Bilateral  Location/Site:  C2-3  Needle size: 25 G  Needle type: Spinal  Needle Placement: Articular Pillar  Findings:  -Contrast Used: 0.5 mL iohexol 180 mg iodine/mL   -Comments: Excellent flow of contrast over the articular pillar and facet joint without intravascular flow  Procedure Details: The fluoroscope beam was positioned to square off the endplates of the desired vertebral level to achieve a true AP position. The beam was then moved in a small "counter" oblique to the contralateral side with a small amount of caudal tilt to achieve a trajectory alignment with the desired nerves.  For each target described below the skin was anesthetized with 1 ml of 1% Lidocaine without epinephrine.   To block the facet joint nerve to C2, the needle was fluoroscopically positioned over the inferior lateral portion of the C2/3 facet joint nerve where the third occipital nerve (TON) lies.   After contact with periosteum and negative aspirate for blood and CSF, correct placement without intravascular or epidural spread was confirmed by Bi-planar images and  injecting 0.5 ml. of Omnipaque-240.  A spot radiograph was obtained of this image.  Next, a 0.5 ml.  volume of 1% Lidocaine without Epinephrine was then injected.  Prior to the procedure, the patient was given a Pain Diary which was completed for baseline measurements.  After the procedure, the patient rated their pain every 30 minutes and will continue rating at this frequency for a total of 5 hours.  The patient has been asked to complete the Diary and return to [place] by mail, fax or hand delivered as soon as possible.   Additional Comments:  The patient tolerated the procedure well Dressing: Band-Aid    Post-procedure details: Patient was observed during the procedure. Post-procedure instructions were reviewed.  Patient left the clinic in stable condition.

## 2019-03-14 MED FILL — CYCLOBENZAPRINE HCL 10 MG T: 10 | 10 days supply | Qty: 30 | Fill #0

## 2019-03-29 ENCOUNTER — Ambulatory Visit: Payer: Self-pay

## 2019-03-29 ENCOUNTER — Ambulatory Visit (INDEPENDENT_AMBULATORY_CARE_PROVIDER_SITE_OTHER): Payer: No Typology Code available for payment source | Admitting: Physical Medicine and Rehabilitation

## 2019-03-29 ENCOUNTER — Other Ambulatory Visit: Payer: Self-pay

## 2019-03-29 ENCOUNTER — Encounter: Payer: Self-pay | Admitting: Physical Medicine and Rehabilitation

## 2019-03-29 VITALS — BP 130/89 | HR 69

## 2019-03-29 DIAGNOSIS — M47812 Spondylosis without myelopathy or radiculopathy, cervical region: Secondary | ICD-10-CM

## 2019-03-29 DIAGNOSIS — M542 Cervicalgia: Secondary | ICD-10-CM | POA: Diagnosis not present

## 2019-03-29 MED ORDER — METHYLPREDNISOLONE ACETATE 80 MG/ML IJ SUSP
80.0000 mg | Freq: Once | INTRAMUSCULAR | Status: AC
  Administered 2019-03-29: 80 mg

## 2019-03-29 NOTE — Progress Notes (Signed)
.  Numeric Pain Rating Scale and Functional Assessment Average Pain 9   In the last MONTH (on 0-10 scale) has pain interfered with the following?  1. General activity like being  able to carry out your everyday physical activities such as walking, climbing stairs, carrying groceries, or moving a chair?  Rating(8)   +Driver, -BT, -Dye Allergies.  

## 2019-04-06 ENCOUNTER — Encounter: Payer: No Typology Code available for payment source | Admitting: Physical Medicine and Rehabilitation

## 2019-04-07 MED FILL — ZOLPIDEM TARTRATE 5 MG TAB: 5 | 30 days supply | Qty: 30 | Fill #1

## 2019-04-28 NOTE — Progress Notes (Signed)
Stacy Woods - 53 y.o. female MRN [id number]  Date of birth: Apr 13, 1966  Office Visit Note: Visit Date: 03/29/2019 PCP: Gregor Hams, MD Referred by: Gregor Hams, MD  Subjective: Chief Complaint  Patient presents with   Head - Pain   Neck - Pain   HPI: Stacy Woods is a 53 y.o. female who comes in today For planned bilateral C2-3 facet joint radiofrequency ablation.  She has had double block medial branch blocks of the C2-3 facet joints bilaterally with good relief with documented pain diary.  She has mild spondylitic change in the upper cervical spine.  She has upper cervical neck pain worse with extension this does refer into the occiput with some occipital and cervicogenic type occipital headache.  She has no symptoms into the arms.  She has had physical therapy she has had medication management.  She continues with Dr. Delfin Edis for headache management.  We will go ahead and complete radiofrequency ablation today.  Please see our prior notes further details justification.  Exam today nonfocal.  ROS Otherwise per HPI.  Assessment & Plan: Visit Diagnoses:  1. Cervical spondylosis without myelopathy   2. Cervicalgia     Plan: No additional findings.   Meds & Orders:  Meds ordered this encounter  Medications   methylPREDNISolone acetate (DEPO-MEDROL) injection 80 mg    Orders Placed This Encounter  Procedures   Radiofrequency,Cervical   XR C-ARM NO REPORT    Follow-up: Return in about 4 weeks (around 04/26/2019).   Procedures: No procedures performed  Cervical Facet Nerve Denervation  Patient: Stacy Woods      Date of Birth: Oct 29, 1965 MRN: [id number] PCP: Gregor Hams, MD      Visit Date: 03/29/2019   Universal Protocol:    Date/Time: 11/19/205:53 AM  Consent Given By: the patient  Position: PRONE  Additional Comments: Vital signs were monitored before and after the procedure. Patient was prepped and draped in the usual sterile fashion. The  correct patient, procedure, and site was verified.   Injection Procedure Details:  Procedure Site One Meds Administered:  Meds ordered this encounter  Medications   methylPREDNISolone acetate (DEPO-MEDROL) injection 80 mg     Laterality: Bilateral  Location/Site:  C2-3  Needle size: 20 G  Needle type: RF cannula, 5 mm active tip  Findings:  -Comments:   Procedure Details: The fluoroscope beam was positioned to square off the endplates of the desired vertebral level to achieve a true AP position. The beam was then moved in a small counter oblique to the contralateral side with a small amount of caudal tilt to achieve a trajectory alignment with the desired nerves.  For each target described below the skin was anesthetized with 1 ml of 1% Lidocaine without epinephrine.  To denervate the facet joint nerve to C2 (third occipital nerve), the cannula was advanced under fluoroscopic guidance and positioned over the inferior lateral portion of the C2/3 facet joint nerve where the third occipital nerve lies.  A minimum of three lesions were made along the location of the nerve.  For all of these levels, AP and lateral images were used to confirm location.  The radiofrequency probe was inserted into the cannula and stimulation was carried out at both sensory and motor levels to make sure there was expected stimulation without a radicular pattern. Subsequently, this was removed and then 0.5 to 1 ml. of 1% Lidocaine was injected. The radiofrequency probe was re-inserted and denervation of the  facet nerves (medial branches of the dorsal rami innervating the facet joints) was carried out at 80-85 degrees Celsius for 60 seconds. Then the cannulas were repositioned to get one additional lesion at each level (total of two) for better efficacy. The above procedure was repeated for each facet joint nerve mentioned above. Radiographs were obtained at each level (unless otherwise noted) to verify probe  placement during the neurotomy.  Additional Comments:  The patient tolerated the procedure well Dressing: Band-Aid    Post-procedure details: Patient was observed during the procedure. Post-procedure instructions were reviewed. Patient left the clinic in stable condition.      Clinical History: No specialty comments available.   She reports that she has never smoked. She has never used smokeless tobacco.  Recent Labs    07/05/18 0841  HGBA1C 5.3    Objective:  VS:  HT:     WT:    BMI:      BP:130/89   HR:69bpm   TEMP: ( )   RESP:  Physical Exam  Ortho Exam Imaging: No results found.  Past Medical/Family/Surgical/Social History: Medications & Allergies reviewed per EMR, new medications updated. Patient Active Problem List   Diagnosis Date Noted   Transaminitis 12/01/2017   MDD (recurrent major depressive disorder) in remission ([place]) 11/03/2017   Insomnia 08/14/2017   AK (actinic keratosis) 07/14/2017   Low magnesium level 01/12/2017   B12 deficiency 01/01/2017   Trochanteric bursitis of left hip 10/08/2016   Malabsorption of iron 05/09/2016   History of gastric bypass 05/09/2016   Arthritis of knee, degenerative 04/14/2016   Surgical menopause 03/24/2016   Family history of breast cancer 03/24/2016   Left knee pain 10/01/2015   Hypoglycemia 06/12/2015   Iron deficiency anemia 05/02/2015   Cubital tunnel syndrome on right 09/14/2014   Degenerative disc disease, cervical 07/27/2014   Prediabetes 09/01/2013   Fibromyalgia 04/11/2013   Essential hypertension, benign 04/11/2013   Anxiety state 04/11/2013   Past Medical History:  Diagnosis Date   AK (actinic keratosis) 07/14/2017   Right upper chest wall   Anemia    Anemia, iron deficiency 05/02/2015   Fibromyalgia    History of gastric bypass 05/09/2016   Hypertension    Insomnia 08/14/2017   Malabsorption of iron 05/09/2016   PONV (postoperative nausea and vomiting)    Seizure  ([place]) 2018   from low blood sugar    Surgical menopause 03/24/2016   Family History  Problem Relation Age of Onset   Diabetes Father    Hypertension Father    Stroke Father    Heart failure Father    CAD Father        has stents   Breast cancer Mother    Migraines Sister    Colon cancer Neg Hx    Colon polyps Neg Hx    Esophageal cancer Neg Hx    Stomach cancer Neg Hx    Rectal cancer Neg Hx    Past Surgical History:  Procedure Laterality Date   ABDOMINAL HYSTERECTOMY  1997   APPENDECTOMY     CESAREAN SECTION  [id number]   CHOLECYSTECTOMY     GASTRIC BYPASS  2011   KNEE ARTHROSCOPY  2002   TONSILLECTOMY  1977   ULNAR NERVE TRANSPOSITION Right 06/12/2015   Procedure: RIGHT CUBITAL TUNNEL RELEASE;  Surgeon: Milly Jakob, MD;  Location: [facility];  Service: Orthopedics;  Laterality: Right;   Social History   Occupational History   Occupation: [employer]  Tobacco Use  Smoking status: Never Smoker   Smokeless tobacco: Never Used  Substance and Sexual Activity   Alcohol use: No   Drug use: No   Sexual activity: Never    Birth control/protection: Surgical

## 2019-04-28 NOTE — Procedures (Signed)
Cervical Facet Nerve Denervation  Patient: Stacy Woods      Date of Birth: 02/17/1966 MRN: [id number] PCP: Gregor Hams, MD      Visit Date: 03/29/2019   Universal Protocol:    Date/Time: 11/19/205:53 AM  Consent Given By: the patient  Position: PRONE  Additional Comments: Vital signs were monitored before and after the procedure. Patient was prepped and draped in the usual sterile fashion. The correct patient, procedure, and site was verified.   Injection Procedure Details:  Procedure Site One Meds Administered:  Meds ordered this encounter  Medications  . methylPREDNISolone acetate (DEPO-MEDROL) injection 80 mg     Laterality: Bilateral  Location/Site:  C2-3  Needle size: 20 G  Needle type: RF cannula, 5 mm active tip  Findings:  -Comments:   Procedure Details: The fluoroscope beam was positioned to square off the endplates of the desired vertebral level to achieve a true AP position. The beam was then moved in a small "counter" oblique to the contralateral side with a small amount of caudal tilt to achieve a trajectory alignment with the desired nerves.  For each target described below the skin was anesthetized with 1 ml of 1% Lidocaine without epinephrine.  To denervate the facet joint nerve to C2 (third occipital nerve), the cannula was advanced under fluoroscopic guidance and positioned over the inferior lateral portion of the C2/3 facet joint nerve where the third occipital nerve lies.  A minimum of three lesions were made along the location of the nerve.  For all of these levels, AP and lateral images were used to confirm location.  The radiofrequency probe was inserted into the cannula and stimulation was carried out at both sensory and motor levels to make sure there was expected stimulation without a radicular pattern. Subsequently, this was removed and then 0.5 to 1 ml. of 1% Lidocaine was injected. The radiofrequency probe was re-inserted and denervation of  the facet nerves (medial branches of the dorsal rami innervating the facet joints) was carried out at 80-85 degrees Celsius for 60 seconds. Then the cannulas were repositioned to get one additional lesion at each level (total of two) for better efficacy. The above procedure was repeated for each facet joint nerve mentioned above. Radiographs were obtained at each level (unless otherwise noted) to verify probe placement during the neurotomy.  Additional Comments:  The patient tolerated the procedure well Dressing: Band-Aid    Post-procedure details: Patient was observed during the procedure. Post-procedure instructions were reviewed. Patient left the clinic in stable condition.

## 2019-05-02 MED FILL — CYCLOBENZAPRINE HCL 10 MG T: 10 | 10 days supply | Qty: 30 | Fill #1

## 2019-05-04 ENCOUNTER — Other Ambulatory Visit: Payer: Self-pay | Admitting: Sports Medicine

## 2019-05-04 ENCOUNTER — Other Ambulatory Visit: Payer: Self-pay | Admitting: Family Medicine

## 2019-05-04 DIAGNOSIS — Z1231 Encounter for screening mammogram for malignant neoplasm of breast: Secondary | ICD-10-CM

## 2019-05-04 MED FILL — ZOLPIDEM TARTRATE 5 MG TAB: 5 | 30 days supply | Qty: 30 | Fill #2

## 2019-05-30 MED FILL — ESCITALOPRAM 10 MG TABLET: 10 | 90 days supply | Qty: 90 | Fill #3

## 2019-05-30 MED FILL — ZOLPIDEM TARTRATE 5 MG TAB: 5 | 30 days supply | Qty: 30 | Fill #3

## 2019-05-30 MED FILL — CYCLOBENZAPRINE HCL 10 MG T: 10 | 10 days supply | Qty: 30 | Fill #2

## 2019-06-01 ENCOUNTER — Ambulatory Visit (INDEPENDENT_AMBULATORY_CARE_PROVIDER_SITE_OTHER): Payer: No Typology Code available for payment source

## 2019-06-01 ENCOUNTER — Other Ambulatory Visit: Payer: Self-pay

## 2019-06-01 DIAGNOSIS — Z1231 Encounter for screening mammogram for malignant neoplasm of breast: Secondary | ICD-10-CM

## 2019-06-30 MED FILL — ZOLPIDEM TARTRATE 5 MG TAB: 5 | 30 days supply | Qty: 30 | Fill #4

## 2019-07-21 ENCOUNTER — Telehealth (INDEPENDENT_AMBULATORY_CARE_PROVIDER_SITE_OTHER): Payer: No Typology Code available for payment source | Admitting: Family Medicine

## 2019-07-21 ENCOUNTER — Encounter: Payer: Self-pay | Admitting: Family Medicine

## 2019-07-21 VITALS — Ht 64.0 in | Wt 199.0 lb

## 2019-07-21 DIAGNOSIS — H409 Unspecified glaucoma: Secondary | ICD-10-CM | POA: Diagnosis not present

## 2019-07-21 NOTE — Progress Notes (Signed)
Stacy Woods - 54 y.o. female MRN [id number]  Date of birth: Aug 26, 1965   This visit type was conducted due to national recommendations for restrictions regarding the COVID-19 Pandemic (e.g. social distancing).  This format is felt to be most appropriate for this patient at this time.  All issues noted in this document were discussed and addressed.  No physical exam was performed (except for noted visual exam findings with Video Visits).  I discussed the limitations of evaluation and management by telemedicine and the availability of in person appointments. The patient expressed understanding and agreed to proceed.  I connected with@ on 07/21/19 at  2:40 PM EST by a video enabled telemedicine application and verified that I am speaking with the correct person using two identifiers.  Present at visit: [name], DO [facility]   Patient Location: Home [place] [address]   Provider location:   [place]  Chief Complaint  Patient presents with  . Referral    HPI  Stacy Woods is a 54 y.o. female who presents via audio/video conferencing for a telehealth visit today.  She reports that she recently was seen by her optometrist with concerns about changes around the optic nerve consistent with glaucoma.  She was recommended to see [facility] and has scheduled an appt for 2/22 with them.  She needs referral due to insurance.  She has not noticed significant changes to her vision and denies eye pain.    ROS:  A comprehensive ROS was completed and negative except as noted per HPI  Past Medical History:  Diagnosis Date  . AK (actinic keratosis) 07/14/2017   Right upper chest wall  . Anemia   . Anemia, iron deficiency 05/02/2015  . Fibromyalgia   . History of gastric bypass 05/09/2016  . Hypertension   . Insomnia 08/14/2017  . Malabsorption of iron 05/09/2016  . PONV (postoperative nausea and vomiting)   . Seizure ([place]) 2018   from  low blood sugar   . Surgical menopause 03/24/2016    Past Surgical History:  Procedure Laterality Date  . ABDOMINAL HYSTERECTOMY  1997  . APPENDECTOMY    . CESAREAN SECTION  [id number]  . CHOLECYSTECTOMY    . GASTRIC BYPASS  2011  . KNEE ARTHROSCOPY  2002  . TONSILLECTOMY  1977  . ULNAR NERVE TRANSPOSITION Right 06/12/2015   Procedure: RIGHT CUBITAL TUNNEL RELEASE;  Surgeon: Milly Jakob, MD;  Location: [place] [facility];  Service: Orthopedics;  Laterality: Right;    Family History  Problem Relation Age of Onset  . Diabetes Father   . Hypertension Father   . Stroke Father   . Heart failure Father   . CAD Father        has stents  . Breast cancer Mother   . Migraines Sister   . Colon cancer Neg Hx   . Colon polyps Neg Hx   . Esophageal cancer Neg Hx   . Stomach cancer Neg Hx   . Rectal cancer Neg Hx     Social History   Socioeconomic History  . Marital status: Divorced    Spouse name: Not on file  . Number of children: 2  . Years of education: Assoc  . Highest education level: Not on file  Occupational History  . Occupation: [employer]  Tobacco Use  . Smoking status: Never Smoker  . Smokeless tobacco: Never Used  Substance and Sexual Activity  . Alcohol use: No  . Drug  use: No  . Sexual activity: Never    Birth control/protection: Surgical  Other Topics Concern  . Not on file  Social History Narrative   Lives with her daughter, son in law, and grandson   Right-handed   Caffeine: 16 oz soda daily   Social Determinants of Health   Financial Resource Strain:   . Difficulty of Paying Living Expenses: Not on file  Food Insecurity:   . Worried About Charity fundraiser in the Last Year: Not on file  . Ran Out of Food in the Last Year: Not on file  Transportation Needs:   . Lack of Transportation (Medical): Not on file  . Lack of Transportation (Non-Medical): Not on file  Physical Activity:   . Days of Exercise per Week: Not on file  . Minutes of  Exercise per Session: Not on file  Stress:   . Feeling of Stress : Not on file  Social Connections:   . Frequency of Communication with Friends and Family: Not on file  . Frequency of Social Gatherings with Friends and Family: Not on file  . Attends Religious Services: Not on file  . Active Member of Clubs or Organizations: Not on file  . Attends Archivist Meetings: Not on file  . Marital Status: Not on file  Intimate Partner Violence:   . Fear of Current or Ex-Partner: Not on file  . Emotionally Abused: Not on file  . Physically Abused: Not on file  . Sexually Abused: Not on file     Current Outpatient Medications:  .  AMBULATORY NON FORMULARY MEDICATION, One Touch Glucometer per insurance formulary. One month of testing strips, and lancets used to check blood sugar daily., Disp: 1 Units, Rfl: prn .  blood glucose meter kit and supplies KIT, Check fasting blood sugar daily., Disp: 1 each, Rfl: 0 .  escitalopram (LEXAPRO) 10 MG tablet, TAKE 1 TABLET (10 MG TOTAL) BY MOUTH DAILY., Disp: 90 tablet, Rfl: 3 .  glucagon 1 MG injection, Inject 1 mg into the muscle once as needed for up to 1 dose. For low blood sugar, Disp: 1 each, Rfl: 12 .  glucose blood (IGLUCOSE TEST STRIPS) test strip, Check fasting blood sugar 3-4 times daily. Dx: E16.2, Disp: 100 each, Rfl: 11 .  Lancets 30G MISC, Check fasting blood sugar daily, Disp: 50 each, Rfl: PRN .  lisinopril (ZESTRIL) 10 MG tablet, Take 1 tablet (10 mg total) by mouth daily., Disp: 90 tablet, Rfl: 3 .  tiZANidine (ZANAFLEX) 4 MG tablet, Take 1 tablet (4 mg total) by mouth every 6 (six) hours as needed for muscle spasms., Disp: 90 tablet, Rfl: 6 .  zolpidem (AMBIEN) 5 MG tablet, TAKE 1 TABLET BY MOUTH AT BEDTIME AS NEEDED FOR SLEEP, Disp: 30 tablet, Rfl: 5  EXAM:  VITALS per patient if applicable: Ht 5' 4"  (1.626 m)   Wt 199 lb (90.3 kg)   BMI 34.16 kg/m   GENERAL: alert, oriented, appears well and in no acute distress  HEENT:  atraumatic, conjunttiva clear, no obvious abnormalities on inspection of external nose and ears  NECK: normal movements of the head and neck  LUNGS: on inspection no signs of respiratory distress, breathing rate appears normal, no obvious gross SOB, gasping or wheezing  CV: no obvious cyanosis  MS: moves all visible extremities without noticeable abnormality  PSYCH/NEURO: pleasant and cooperative, no obvious depression or anxiety, speech and thought processing grossly intact  ASSESSMENT AND PLAN:  Discussed the following assessment  and plan:  Glaucoma Referral entered for ophthalmology referral to [facility].  Patient has already scheduled appt. For 2/22      I discussed the assessment and treatment plan with the patient. The patient was provided an opportunity to ask questions and all were answered. The patient agreed with the plan and demonstrated an understanding of the instructions.   The patient was advised to call back or seek an in-person evaluation if the symptoms worsen or if the condition fails to improve as anticipated.    [name], DO

## 2019-07-21 NOTE — Assessment & Plan Note (Signed)
Referral entered for ophthalmology referral to [facility].  Patient has already scheduled appt. For 2/22

## 2019-07-21 NOTE — Progress Notes (Signed)
Patient just needs a referral to [facility]. No other concerns.   Declined depression screening.

## 2019-07-22 MED FILL — CYCLOBENZAPRINE HCL 10 MG T: 10 | 10 days supply | Qty: 30 | Fill #3

## 2019-07-27 ENCOUNTER — Encounter: Payer: Self-pay | Admitting: Family Medicine

## 2019-07-27 ENCOUNTER — Ambulatory Visit (INDEPENDENT_AMBULATORY_CARE_PROVIDER_SITE_OTHER): Payer: No Typology Code available for payment source | Admitting: Family Medicine

## 2019-07-27 DIAGNOSIS — M25511 Pain in right shoulder: Secondary | ICD-10-CM | POA: Insufficient documentation

## 2019-07-27 MED ORDER — NAPROXEN 500 MG PO TABS: 500.0000 mg | ORAL_TABLET | Freq: Two times a day (BID) | ORAL | 0 refills | Status: DC

## 2019-07-27 NOTE — Progress Notes (Signed)
Stacy Woods - 54 y.o. female MRN [id number]  Date of birth: 1965/11/25  Subjective Chief Complaint  Patient presents with  . Follow-up    HPI Stacy Woods is a 54 y.o. R handed female here today with complaint of R shoulder pain.  She reports that last Thursday she slipped in the mud and fell injuring her R shoulder.  She does not recall feeling a pop.  Her ROM is fairly good but she does have pain if picking something up or pushing down to get out of a chair.  She has had difficulty with some tasks including fastening her bra.  She has mild tingling but no numbness or weakness into the arm.  She has tried ibuprofen and heat with mild relief.   ROS:  A comprehensive ROS was completed and negative except as noted per HPI  Allergies  Allergen Reactions  . Mobic [Meloxicam] Swelling  . Shellfish Allergy Nausea Only and Swelling  . Lyrica [Pregabalin] Other (See Comments)    "zombie"  . Percocet [Oxycodone-Acetaminophen] Itching    Past Medical History:  Diagnosis Date  . AK (actinic keratosis) 07/14/2017   Right upper chest wall  . Anemia   . Anemia, iron deficiency 05/02/2015  . Fibromyalgia   . History of gastric bypass 05/09/2016  . Hypertension   . Insomnia 08/14/2017  . Malabsorption of iron 05/09/2016  . PONV (postoperative nausea and vomiting)   . Seizure ([place]) 2018   from low blood sugar   . Surgical menopause 03/24/2016    Past Surgical History:  Procedure Laterality Date  . ABDOMINAL HYSTERECTOMY  1997  . APPENDECTOMY    . CESAREAN SECTION  [id number]  . CHOLECYSTECTOMY    . GASTRIC BYPASS  2011  . KNEE ARTHROSCOPY  2002  . TONSILLECTOMY  1977  . ULNAR NERVE TRANSPOSITION Right 06/12/2015   Procedure: RIGHT CUBITAL TUNNEL RELEASE;  Surgeon: Milly Jakob, MD;  Location: [facility];  Service: Orthopedics;  Laterality: Right;    Social History   Socioeconomic History  . Marital status: Divorced    Spouse name: Not on file  . Number of children:  2  . Years of education: Assoc  . Highest education level: Not on file  Occupational History  . Occupation: [employer]  Tobacco Use  . Smoking status: Never Smoker  . Smokeless tobacco: Never Used  Substance and Sexual Activity  . Alcohol use: No  . Drug use: No  . Sexual activity: Never    Birth control/protection: Surgical  Other Topics Concern  . Not on file  Social History Narrative   Lives with her daughter, son in law, and grandson   Right-handed   Caffeine: 16 oz soda daily   Social Determinants of Health   Financial Resource Strain:   . Difficulty of Paying Living Expenses: Not on file  Food Insecurity:   . Worried About Charity fundraiser in the Last Year: Not on file  . Ran Out of Food in the Last Year: Not on file  Transportation Needs:   . Lack of Transportation (Medical): Not on file  . Lack of Transportation (Non-Medical): Not on file  Physical Activity:   . Days of Exercise per Week: Not on file  . Minutes of Exercise per Session: Not on file  Stress:   . Feeling of Stress : Not on file  Social Connections:   . Frequency of Communication with Friends and Family: Not on file  . Frequency of Social  Gatherings with Friends and Family: Not on file  . Attends Religious Services: Not on file  . Active Member of Clubs or Organizations: Not on file  . Attends Archivist Meetings: Not on file  . Marital Status: Not on file    Family History  Problem Relation Age of Onset  . Diabetes Father   . Hypertension Father   . Stroke Father   . Heart failure Father   . CAD Father        has stents  . Breast cancer Mother   . Migraines Sister   . Colon cancer Neg Hx   . Colon polyps Neg Hx   . Esophageal cancer Neg Hx   . Stomach cancer Neg Hx   . Rectal cancer Neg Hx     Health Maintenance  Topic Date Due  . HIV Screening  02/26/1983  . MAMMOGRAM  05/31/2021  . TETANUS/TDAP  03/24/2026  . COLONOSCOPY  07/29/2028  . INFLUENZA VACCINE  Completed     ----------------------------------------------------------------------------------------------------------------------------------------------------------------------------------------------------------------- Physical Exam BP (!) 144/95   Pulse 71   Temp 98 F (36.7 C) (Oral)   Ht 5\' 4"  (1.626 m)   Wt 214 lb (97.1 kg)   BMI 36.73 kg/m   Physical Exam Constitutional:      Appearance: Normal appearance.  HENT:     Head: Normocephalic and atraumatic.  Musculoskeletal:     Cervical back: Neck supple.     Comments: R shoulder normal to inspection and palpation.  PROM is pretty good with mild pain on full abduction.  AROM with some pain on internal rotation and abduction.  Rotator cuff strength is slightly diminished with testing of supraspinatus.  +empty can   Skin:    General: Skin is warm and dry.  Neurological:     Mental Status: She is alert.     ------------------------------------------------------------------------------------------------------------------------------------------------------------------------------------------------------------------- Assessment and Plan  Right shoulder pain Rotator cuff strain vs partial tear.   She may continue ice/heat as needed for comfort.  Add naproxen 500mg  bid She will call if not improving over the next week.  Discussed having her see Dr. Dianah Field if symptoms are not improving.      This visit occurred during the SARS-CoV-2 public health emergency.  Safety protocols were in place, including screening questions prior to the visit, additional usage of staff PPE, and extensive cleaning of exam room while observing appropriate contact time as indicated for disinfecting solutions.

## 2019-07-27 NOTE — Patient Instructions (Signed)
If not improving over the next week let me know and I'll get a referral entered to see Dr. Darene Lamer   Rotator Cuff Tear  A rotator cuff tear is a partial or complete tear of the cord-like bands (tendons) that connect muscle to bone in the rotator cuff. The rotator cuff is a group of muscles and tendons that surround the shoulder joint and keep the upper arm bone (humerus) in the shoulder socket. The tear can occur suddenly (acute tear) or can develop over a long period of time (chronic tear). What are the causes? Acute tears may be caused by:  A fall, especially on an outstretched arm.  Lifting very heavy objects with a jerking motion. Chronic tears may be caused by overuse of the muscles. This may happen in sports, physical work, or activities in which your arm repeatedly moves over your head. What increases the risk? This condition is more likely to occur in:  Athletes and workers who frequently use their shoulder or reach over their heads. This may include activities such as: ? Tennis. ? Baseball and softball. ? Swimming and rowing. ? Weightlifting. ? Architect work. ? Painting.  People who smoke.  Older people who have arthritis or poor blood supply. These can make the muscles and tendons weaker. What are the signs or symptoms? Symptoms of this condition depend on the type and severity of the injury:  An acute tear may include a sudden tearing feeling, followed by severe pain that goes from your upper shoulder, down your arm, and toward your elbow.  A chronic tear includes a gradual weakness and decreased shoulder motion as the pain gets worse. The pain is usually worse at night. Both types may have symptoms such as:  Pain that spreads (radiates) from the shoulder to the upper arm.  Swelling and tenderness in front of the shoulder.  Decreased range of motion.  Pain when: ? Reaching, pulling, or lifting the arm above the head. ? Lowering the arm from above the head.  Not  being able to raise your arm out to the side.  Difficulty placing the arm behind your back. How is this diagnosed? This condition is diagnosed with a medical history and physical exam. Imaging tests may also be done, including:  X-rays.  MRI.  Ultrasound.  CT or MR arthrogram. During this test, a contrast material is injected into your shoulder and then images are taken. How is this treated? Treatment for this condition depends on the type and severity of the condition. In less severe cases, treatment may include:  Rest. This may be done with a sling that holds the shoulder still (immobilization). Your health care provider may also recommend avoiding activities that involve lifting your arm over your head.  Icing the shoulder.  Anti-inflammatory medicines, such as aspirin or ibuprofen.  Strengthening and stretching exercises. Your health care provider may recommend specific exercises to improve your range of motion and strengthen your shoulder. In more severe cases, treatment may include:  Physical therapy.  Steroid injections.  Surgery. Follow these instructions at home: Managing pain, stiffness, and swelling  If directed, put ice on the injured area. ? If you have a removable sling, remove it as told by your health care provider. ? Put ice in a plastic bag. ? Place a towel between your skin and the bag. ? Leave the ice on for 20 minutes, 2-3 times a day.  Raise (elevate) the injured area above the level of your heart while you are lying  down.  Find a comfortable sleeping position or sleep on a recliner, if available.  Move your fingers often to avoid stiffness and to lessen swelling.  Once the swelling has gone down, your health care provider may direct you to apply heat to relax the muscles. Use the heat source that your health care provider recommends, such as a moist heat pack or a heating pad. ? Place a towel between your skin and the heat source. ? Leave the heat  on for 20-30 minutes. ? Remove the heat if your skin turns bright red. This is especially important if you are unable to feel pain, heat, or cold. You may have a greater risk of getting burned. If you have a sling:  Wear the sling as told by your health care provider. Remove it only as told by your health care provider.  Loosen the sling if your fingers tingle, become numb, or turn cold and blue.  Keep the sling clean.  If the sling is not waterproof: ? Do not let it get wet. ? Cover it with a watertight covering when you take a bath or a shower. Driving  Do not drive or use heavy machinery while taking prescription pain medicine.  Ask your health care provider when it is safe to drive if you have a sling on your arm. Activity  Rest your shoulder as told by your health care provider.  Return to your normal activities as told by your health care provider. Ask your health care provider what activities are safe for you.  Do any exercises or stretches as told by your health care provider. General instructions  Do not use any products that contain nicotine or tobacco, such as cigarettes and e-cigarettes. If you need help quitting, ask your health care provider.  Take over-the-counter and prescription medicines only as told by your health care provider.  Keep all follow-up visits as told by your health care provider. This is important. Contact a health care provider if:  Your pain gets worse.  You have new pain in your arm, hands, or fingers.  Medicine does not help your pain. Get help right away if:  Your arm, hand, or fingers are numb or tingling.  Your arm, hand, or fingers are swollen or painful or they turn white or blue.  Your hand or fingers on your injured arm are colder than your other hand. Summary  A rotator cuff tear is a partial or complete tear of the cord-like bands (tendons) that connect muscle to bone in the rotator cuff.  The tear can occur suddenly  (acute tear) or can develop over a long period of time (chronic tear).  Treatment generally includes rest, anti-inflammatory medicines, and icing. In some cases, physical therapy and steroid injections may be needed. In severe cases, surgery may be needed. This information is not intended to replace advice given to you by your health care provider. Make sure you discuss any questions you have with your health care provider. Document Revised: 05/08/2017 Document Reviewed: 08/11/2016 Elsevier Patient Education  [place].

## 2019-07-27 NOTE — Assessment & Plan Note (Signed)
Rotator cuff strain vs partial tear.   She may continue ice/heat as needed for comfort.  Add naproxen 500mg  bid She will call if not improving over the next week.  Discussed having her see Dr. Dianah Field if symptoms are not improving.

## 2019-08-01 ENCOUNTER — Encounter: Payer: Self-pay | Admitting: Family Medicine

## 2019-08-02 ENCOUNTER — Other Ambulatory Visit: Payer: Self-pay | Admitting: Family Medicine

## 2019-08-02 MED ORDER — METHYLPREDNISOLONE 4 MG PO TBPK: ORAL_TABLET | ORAL | 0 refills | Status: DC

## 2019-08-03 ENCOUNTER — Other Ambulatory Visit: Payer: Self-pay | Admitting: Family Medicine

## 2019-08-03 ENCOUNTER — Encounter: Payer: Self-pay | Admitting: Family Medicine

## 2019-08-08 MED FILL — ZOLPIDEM TARTRATE 5 MG TAB: 5 | 30 days supply | Qty: 30 | Fill #5

## 2019-08-17 ENCOUNTER — Encounter: Payer: Self-pay | Admitting: Family Medicine

## 2019-08-25 ENCOUNTER — Encounter: Payer: Self-pay | Admitting: Family Medicine

## 2019-08-25 ENCOUNTER — Telehealth (INDEPENDENT_AMBULATORY_CARE_PROVIDER_SITE_OTHER): Payer: No Typology Code available for payment source | Admitting: Family Medicine

## 2019-08-25 VITALS — Ht 64.0 in | Wt 199.0 lb

## 2019-08-25 DIAGNOSIS — R5383 Other fatigue: Secondary | ICD-10-CM | POA: Diagnosis not present

## 2019-08-25 DIAGNOSIS — Z9884 Bariatric surgery status: Secondary | ICD-10-CM | POA: Diagnosis not present

## 2019-08-25 DIAGNOSIS — K5900 Constipation, unspecified: Secondary | ICD-10-CM

## 2019-08-25 DIAGNOSIS — R635 Abnormal weight gain: Secondary | ICD-10-CM

## 2019-08-25 DIAGNOSIS — M2560 Stiffness of unspecified joint, not elsewhere classified: Secondary | ICD-10-CM

## 2019-08-25 NOTE — Progress Notes (Signed)
Hand pain and swelling for several weeks.  Pretty constant. Worse overnight.,  In the morning its all she can do to make a fist after sleeping over night.   She has some redness that comes and goes.  She reports that the strength in her hands.   Constipation  Weight gain recently.  She reports she has a lot going on and not sure if her symptoms are related.  She will need a new blood sugar meter.

## 2019-08-28 ENCOUNTER — Encounter: Payer: Self-pay | Admitting: Family Medicine

## 2019-08-28 DIAGNOSIS — K5900 Constipation, unspecified: Secondary | ICD-10-CM | POA: Insufficient documentation

## 2019-08-28 DIAGNOSIS — M2560 Stiffness of unspecified joint, not elsewhere classified: Secondary | ICD-10-CM | POA: Insufficient documentation

## 2019-08-28 NOTE — Assessment & Plan Note (Signed)
She is having some constipation as well as weight gain.  Will check TSH.

## 2019-08-28 NOTE — Assessment & Plan Note (Signed)
Update B vitamins and ferritin levels given reported fatigue.

## 2019-08-28 NOTE — Progress Notes (Signed)
Stacy Woods - 54 y.o. female MRN [id number]  Date of birth: 1965/11/16   This visit type was conducted due to national recommendations for restrictions regarding the COVID-19 Pandemic (e.g. social distancing).  This format is felt to be most appropriate for this patient at this time.  All issues noted in this document were discussed and addressed.  No physical exam was performed (except for noted visual exam findings with Video Visits).  I discussed the limitations of evaluation and management by telemedicine and the availability of in person appointments. The patient expressed understanding and agreed to proceed.  I connected with@ on 08/25/19 at  4:20 PM EDT by a video enabled telemedicine application and verified that I am speaking with the correct person using two identifiers.  Present at visit: [name], [place]   Patient Location: Home [place] [address]   Provider location:   [facility]  Chief Complaint  Patient presents with  . Hand Pain    HPI  Stacy Woods is a 54 y.o. female who presents via audio/video conferencing for a telehealth visit today.  She has concerns today of constellation of symptoms including  hand pain, constipation and weight gain.  She reports that hand pain began a few weeks ago.  Additional symptoms include swelling in hands and stiffness that is worse when first waking up in the morning.  She feels like hand and grip strength is diminished.  This does improve some throughout the day.  She denies feeling of numbness or tingling.    In addition to the hand pain she has had some weight gain and constipation.  She is concerned about thyroid problems which may be contributing to this.  She denies increased stress or depressive symptoms.    ROS:  A comprehensive ROS was completed and negative except as noted per HPI  Past Medical History:  Diagnosis Date  . AK (actinic keratosis) 07/14/2017   Right upper chest wall  . Anemia    . Anemia, iron deficiency 05/02/2015  . Fibromyalgia   . History of gastric bypass 05/09/2016  . Hypertension   . Insomnia 08/14/2017  . Malabsorption of iron 05/09/2016  . PONV (postoperative nausea and vomiting)   . Seizure ([place]) 2018   from low blood sugar   . Surgical menopause 03/24/2016    Past Surgical History:  Procedure Laterality Date  . ABDOMINAL HYSTERECTOMY  1997  . APPENDECTOMY    . CESAREAN SECTION  [id number]  . CHOLECYSTECTOMY    . GASTRIC BYPASS  2011  . KNEE ARTHROSCOPY  2002  . TONSILLECTOMY  1977  . ULNAR NERVE TRANSPOSITION Right 06/12/2015   Procedure: RIGHT CUBITAL TUNNEL RELEASE;  Surgeon: Milly Jakob, MD;  Location: [facility];  Service: Orthopedics;  Laterality: Right;    Family History  Problem Relation Age of Onset  . Diabetes Father   . Hypertension Father   . Stroke Father   . Heart failure Father   . CAD Father        has stents  . Breast cancer Mother   . Migraines Sister   . Colon cancer Neg Hx   . Colon polyps Neg Hx   . Esophageal cancer Neg Hx   . Stomach cancer Neg Hx   . Rectal cancer Neg Hx     Social History   Socioeconomic History  . Marital status: Divorced    Spouse name: Not on file  . Number of children: 2  .  Years of education: Assoc  . Highest education level: Not on file  Occupational History  . Occupation: [employer]  Tobacco Use  . Smoking status: Never Smoker  . Smokeless tobacco: Never Used  Substance and Sexual Activity  . Alcohol use: No  . Drug use: No  . Sexual activity: Never    Birth control/protection: Surgical  Other Topics Concern  . Not on file  Social History Narrative   Lives with her daughter, son in law, and grandson   Right-handed   Caffeine: 16 oz soda daily   Social Determinants of Health   Financial Resource Strain:   . Difficulty of Paying Living Expenses:   Food Insecurity:   . Worried About Charity fundraiser in the Last Year:   . Arboriculturist in the Last  Year:   Transportation Needs:   . Film/video editor (Medical):   Marland Kitchen Lack of Transportation (Non-Medical):   Physical Activity:   . Days of Exercise per Week:   . Minutes of Exercise per Session:   Stress:   . Feeling of Stress :   Social Connections:   . Frequency of Communication with Friends and Family:   . Frequency of Social Gatherings with Friends and Family:   . Attends Religious Services:   . Active Member of Clubs or Organizations:   . Attends Archivist Meetings:   Marland Kitchen Marital Status:   Intimate Partner Violence:   . Fear of Current or Ex-Partner:   . Emotionally Abused:   Marland Kitchen Physically Abused:   . Sexually Abused:      Current Outpatient Medications:  .  AMBULATORY NON FORMULARY MEDICATION, One Touch Glucometer per insurance formulary. One month of testing strips, and lancets used to check blood sugar daily., Disp: 1 Units, Rfl: prn .  blood glucose meter kit and supplies KIT, Check fasting blood sugar daily., Disp: 1 each, Rfl: 0 .  escitalopram (LEXAPRO) 10 MG tablet, TAKE 1 TABLET (10 MG TOTAL) BY MOUTH DAILY., Disp: 90 tablet, Rfl: 3 .  glucagon 1 MG injection, Inject 1 mg into the muscle once as needed for up to 1 dose. For low blood sugar, Disp: 1 each, Rfl: 12 .  glucose blood (IGLUCOSE TEST STRIPS) test strip, Check fasting blood sugar 3-4 times daily. Dx: E16.2, Disp: 100 each, Rfl: 11 .  Lancets 30G MISC, Check fasting blood sugar daily, Disp: 50 each, Rfl: PRN .  lisinopril (ZESTRIL) 10 MG tablet, Take 1 tablet (10 mg total) by mouth daily., Disp: 90 tablet, Rfl: 3 .  tiZANidine (ZANAFLEX) 4 MG tablet, Take 1 tablet (4 mg total) by mouth every 6 (six) hours as needed for muscle spasms., Disp: 90 tablet, Rfl: 6 .  zolpidem (AMBIEN) 5 MG tablet, TAKE 1 TABLET BY MOUTH AT BEDTIME AS NEEDED FOR SLEEP, Disp: 30 tablet, Rfl: 5  EXAM:  VITALS per patient if applicable: Ht 5' 4"  (1.626 m)   Wt 199 lb (90.3 kg)   BMI 34.16 kg/m   GENERAL: alert,  oriented, appears well and in no acute distress  HEENT: atraumatic, conjunttiva clear, no obvious abnormalities on inspection of external nose and ears  NECK: normal movements of the head and neck  LUNGS: on inspection no signs of respiratory distress, breathing rate appears normal, no obvious gross SOB, gasping or wheezing  CV: no obvious cyanosis  MS: moves all visible extremities without noticeable abnormality  PSYCH/NEURO: pleasant and cooperative, no obvious depression or anxiety, speech and thought processing  grossly intact  ASSESSMENT AND PLAN:  Discussed the following assessment and plan:  Constipation She is having some constipation as well as weight gain.  Will check TSH.   Morning joint stiffness Listed history of fibromyalgia.   Current symptoms concerning for inflammatory arthritis with joint swelling and morning stiffness.  Will check ESR, rheumatoid factor and uric acid. Can consider xrays of hands  DDx also includes CTS.  May need to check NCV/EMG.    History of gastric bypass Update B vitamins and ferritin levels given reported fatigue.    30 minutes spent including pre visit preparation, review of prior notes and labs, encounter with patient via video visit and same day documentation.     I discussed the assessment and treatment plan with the patient. The patient was provided an opportunity to ask questions and all were answered. The patient agreed with the plan and demonstrated an understanding of the instructions.   The patient was advised to call back or seek an in-person evaluation if the symptoms worsen or if the condition fails to improve as anticipated.    [name], DO

## 2019-08-28 NOTE — Assessment & Plan Note (Signed)
Listed history of fibromyalgia.   Current symptoms concerning for inflammatory arthritis with joint swelling and morning stiffness.  Will check ESR, rheumatoid factor and uric acid. Can consider xrays of hands  DDx also includes CTS.  May need to check NCV/EMG.

## 2019-08-29 ENCOUNTER — Other Ambulatory Visit: Payer: Self-pay | Admitting: Family Medicine

## 2019-08-29 DIAGNOSIS — F411 Generalized anxiety disorder: Secondary | ICD-10-CM

## 2019-08-29 LAB — SEDIMENTATION RATE: Sed Rate: 25 mm/h (ref 0–30)

## 2019-08-29 LAB — ANA,IFA RA DIAG PNL W/RFLX TIT/PATN
Anti Nuclear Antibody (ANA): NEGATIVE
Cyclic Citrullin Peptide Ab: <16 UNITS
Rheumatoid fact SerPl-aCnc: <14 IU/mL (ref <14)

## 2019-08-29 LAB — VITAMIN B1: Vitamin B1 (Thiamine): 14 nmol/L (ref 8–30)

## 2019-08-29 LAB — TSH: TSH: 2.91 m[IU]/L

## 2019-08-29 LAB — B12 AND FOLATE PANEL
Folate: 17.1 ng/mL
Vitamin B-12: 326 pg/mL (ref 200–1100)

## 2019-08-29 LAB — FERRITIN: Ferritin: 8 ng/mL — ABNORMAL LOW (ref 16–232)

## 2019-08-29 MED FILL — ESCITALOPRAM 10 MG TABLET: 10 | 90 days supply | Qty: 90 | Fill #0

## 2019-08-30 ENCOUNTER — Encounter: Payer: Self-pay | Admitting: Family Medicine

## 2019-08-31 ENCOUNTER — Encounter: Payer: Self-pay | Admitting: Family Medicine

## 2019-08-31 MED ORDER — BLOOD GLUCOSE METER KIT: PACK | 3 refills | Status: AC

## 2019-08-31 MED FILL — FREESTYLE LITE METER: 30 days supply | Qty: 1 | Fill #0

## 2019-08-31 MED FILL — FREESTYLE LANCETS: 25 days supply | Qty: 100 | Fill #0

## 2019-08-31 MED FILL — FREESTYLE LITE TEST STRIP: 25 days supply | Qty: 100 | Fill #0

## 2019-08-31 NOTE — Telephone Encounter (Signed)
RX for meter and testing supplies printed and given to Dr. Zigmund Daniel for signature.  Pt is requesting lab results.  Please advise.  Charyl Bigger, CMA

## 2019-09-02 MED FILL — CYCLOBENZAPRINE HCL 10 MG T: 10 | 10 days supply | Qty: 30 | Fill #4

## 2019-09-08 ENCOUNTER — Other Ambulatory Visit: Payer: Self-pay | Admitting: Family Medicine

## 2019-09-08 NOTE — Telephone Encounter (Signed)
Last filled 03/02/2019 #30 with 5 refills  Video visit on 08/25/2019

## 2019-09-09 MED FILL — ZOLPIDEM TARTRATE 5 MG TAB: 5 | 30 days supply | Qty: 30 | Fill #0

## 2019-10-05 MED FILL — ZOLPIDEM TARTRATE 5 MG TAB: 5 | 30 days supply | Qty: 30 | Fill #1

## 2019-10-05 MED FILL — CYCLOBENZAPRINE HCL 10 MG T: 10 | 10 days supply | Qty: 30 | Fill #5

## 2019-10-17 ENCOUNTER — Encounter: Payer: Self-pay | Admitting: Family Medicine

## 2019-10-17 DIAGNOSIS — M5481 Occipital neuralgia: Secondary | ICD-10-CM

## 2019-10-17 DIAGNOSIS — G4489 Other headache syndrome: Secondary | ICD-10-CM

## 2019-10-17 DIAGNOSIS — M542 Cervicalgia: Secondary | ICD-10-CM

## 2019-10-17 DIAGNOSIS — R519 Headache, unspecified: Secondary | ICD-10-CM

## 2019-10-18 NOTE — Telephone Encounter (Signed)
Referral pended. Please verify this is correct. Thanks

## 2019-10-26 ENCOUNTER — Telehealth: Payer: Self-pay | Admitting: Physical Medicine and Rehabilitation

## 2019-10-26 NOTE — Telephone Encounter (Signed)
Called pt 10/25/19 lvm for pt to call back for an ov, documented in referral.

## 2019-10-26 NOTE — Telephone Encounter (Signed)
I think we had already tried to get approval, I think we need OV if I understood

## 2019-10-26 NOTE — Telephone Encounter (Signed)
Patient left a message requesting a repeat of her last procedure/ injection. Bilateral C2-3 RFA October 2020. Ok to repeat?

## 2019-11-08 ENCOUNTER — Encounter: Payer: Self-pay | Admitting: Physical Medicine and Rehabilitation

## 2019-11-08 ENCOUNTER — Ambulatory Visit (INDEPENDENT_AMBULATORY_CARE_PROVIDER_SITE_OTHER): Payer: No Typology Code available for payment source | Admitting: Physical Medicine and Rehabilitation

## 2019-11-08 ENCOUNTER — Other Ambulatory Visit: Payer: Self-pay

## 2019-11-08 VITALS — BP 102/78 | HR 69 | Ht 64.0 in | Wt 200.0 lb

## 2019-11-08 DIAGNOSIS — M542 Cervicalgia: Secondary | ICD-10-CM | POA: Diagnosis not present

## 2019-11-08 DIAGNOSIS — M47812 Spondylosis without myelopathy or radiculopathy, cervical region: Secondary | ICD-10-CM | POA: Diagnosis not present

## 2019-11-08 DIAGNOSIS — M5481 Occipital neuralgia: Secondary | ICD-10-CM

## 2019-11-08 DIAGNOSIS — M7918 Myalgia, other site: Secondary | ICD-10-CM

## 2019-11-08 DIAGNOSIS — M797 Fibromyalgia: Secondary | ICD-10-CM

## 2019-11-08 MED FILL — ZOLPIDEM TARTRATE 5 MG TAB: 5 | 30 days supply | Qty: 30 | Fill #2

## 2019-11-08 NOTE — Progress Notes (Signed)
Pt states pain in the back of the head, neck and the top of both shoulders. Pt states pain started a few months ago. Stress and moving head and neck makes pain worse. Heating pad helps with pain.   .Numeric Pain Rating Scale and Functional Assessment Average Pain 8 Pain Right Now 8 My pain is constant, stabbing and aching Pain is worse with: bending and some activites Pain improves with: heat/ice   In the last MONTH (on 0-10 scale) has pain interfered with the following?  1. General activity like being  able to carry out your everyday physical activities such as walking, climbing stairs, carrying groceries, or moving a chair?  Rating(9)  2. Relation with others like being able to carry out your usual social activities and roles such as  activities at home, at work and in your community. Rating(9)  3. Enjoyment of life such that you have  been bothered by emotional problems such as feeling anxious, depressed or irritable?  Rating(6)

## 2019-11-09 ENCOUNTER — Encounter: Payer: Self-pay | Admitting: Physical Medicine and Rehabilitation

## 2019-11-09 ENCOUNTER — Encounter: Payer: Self-pay | Admitting: Family Medicine

## 2019-11-09 DIAGNOSIS — M5481 Occipital neuralgia: Secondary | ICD-10-CM

## 2019-11-09 DIAGNOSIS — R519 Headache, unspecified: Secondary | ICD-10-CM

## 2019-11-09 DIAGNOSIS — M5030 Other cervical disc degeneration, unspecified cervical region: Secondary | ICD-10-CM

## 2019-11-09 DIAGNOSIS — R51 Headache with orthostatic component, not elsewhere classified: Secondary | ICD-10-CM

## 2019-11-09 DIAGNOSIS — G379 Demyelinating disease of central nervous system, unspecified: Secondary | ICD-10-CM

## 2019-11-09 DIAGNOSIS — M542 Cervicalgia: Secondary | ICD-10-CM

## 2019-11-09 NOTE — Progress Notes (Signed)
Stacy Woods - 54 y.o. female MRN [id number]  Date of birth: 11/20/1965  Office Visit Note: Visit Date: 11/08/2019 PCP: [name], DO Referred by: [name], DO  Subjective: Chief Complaint  Patient presents with  . Head - Pain  . Neck - Pain  . Right Shoulder - Pain  . Left Shoulder - Pain   HPI: Stacy Woods is a 54 y.o. female who comes in today For evaluation of exacerbation of chronic neck pain and upper neck pain and occipital type headache.  By way of review she was originally sent to [place] for evaluation last year at the request of Dr. Sarina Ill.  She had been treated for headaches with medication management as well as occipital nerve blocks which were temporary found to be beneficial.  She was originally treated by her primary care physician Dr. [name].  My prior notes can be fully reviewed from the evaluation we did at that point.  She comes in today after having had radiofrequency ablation of the C2-3 facet joints bilaterally in October of last year.  She reports significant relief really probably 70% of her pain.  She reports it did not completely go away but it was very beneficial.  It was to the point where she really had not followed up with Dr. Jaynee Eagles.  She reports that the pain in the occiput and upper cervical spine began hurting in earnest a few weeks ago without any specific injury.  Her case is complicated by fibromyalgia as well as intolerance of a lot of medications.  She reports 8 out of 10 pain in the back of the head and upper neck some referral to the shoulders bilaterally.  She reports some relief with heating pad and ice.  She reports the pain right now is constant stabbing and aching and worse with bending and doing some activities.  She denies any radicular pain down the arms no paresthesias.  She really had tried and failed all other conservative care at that point.  Review of Systems  Musculoskeletal: Positive for joint pain and neck pain.    Neurological: Positive for headaches.  All other systems reviewed and are negative.  Otherwise per HPI.  Assessment & Plan: Visit Diagnoses:  1. Cervical spondylosis without myelopathy   2. Cervicalgia   3. Bilateral occipital neuralgia   4. Fibromyalgia   5. Myofascial pain syndrome     Plan: Findings:  Chronic history of occipital headache and upper cervical neck pain some referral to the shoulders with exacerbation over the last few weeks of similar complaints that seemingly were relieved pretty successfully with radiofrequency ablation of the C2-3 facet joints.  She underwent diagnostic blocks prior to that and has done well with the ablation.  She continues with home exercises and stretching heat and ice without any good results.  At this point we will look at repeating the ablation.  Its been over 6 months.  I do want her to make appointment with Dr. Jaynee Eagles for continued follow-up care of her headaches in general which is not my specialty.    Meds & Orders: No orders of the defined types were placed in this encounter.  No orders of the defined types were placed in this encounter.   Follow-up: Return for Get preauthorization for repeat ablation of the C2-3 facet joints bilaterally.   Procedures: No procedures performed  No notes on file   Clinical History: No specialty comments available.   She reports that she has  never smoked. She has never used smokeless tobacco. No results for input(s): HGBA1C, LABURIC in the last 8760 hours.  Objective:  VS:  HT:5\' 4"  (162.6 cm)   WT:200 lb (90.7 kg)  BMI:34.31    BP:102/78  HR:69bpm  TEMP: ( )  RESP:  Physical Exam Vitals and nursing note reviewed.  Constitutional:      General: She is not in acute distress.    Appearance: Normal appearance. She is well-developed. She is not ill-appearing.  HENT:     Head: Normocephalic and atraumatic.  Eyes:     Conjunctiva/sclera: Conjunctivae normal.     Pupils: Pupils are equal, round,  and reactive to light.  Cardiovascular:     Rate and Rhythm: Normal rate.     Pulses: Normal pulses.  Pulmonary:     Effort: Pulmonary effort is normal.  Musculoskeletal:     Cervical back: Normal range of motion. Tenderness present. No rigidity.     Right lower leg: No edema.     Left lower leg: No edema.     Comments: Patient sits with somewhat forward flexed cervical spine with taut bands in the paraspinal musculature and trigger points in the levator scapula bilaterally but this does not reproduce her pain.  She does not have any impingement of the shoulders bilaterally.  She does have tenderness in the upper cervical spine and occiput.  Negative Tinel's over the occipital nerves.  Skin:    General: Skin is warm and dry.     Findings: No erythema or rash.  Neurological:     General: No focal deficit present.     Mental Status: She is alert and oriented to person, place, and time.     Sensory: No sensory deficit.     Motor: No abnormal muscle tone.     Coordination: Coordination normal.     Gait: Gait normal.  Psychiatric:        Mood and Affect: Mood normal.        Behavior: Behavior normal.     Ortho Exam  Imaging: No results found.  Past Medical/Family/Surgical/Social History: Medications & Allergies reviewed per EMR, new medications updated. Patient Active Problem List   Diagnosis Date Noted  . Morning joint stiffness 08/28/2019  . Constipation 08/28/2019  . Right shoulder pain 07/27/2019  . Glaucoma 07/21/2019  . Transaminitis 12/01/2017  . MDD (recurrent major depressive disorder) in remission ([place]) 11/03/2017  . Insomnia 08/14/2017  . AK (actinic keratosis) 07/14/2017  . Low magnesium level 01/12/2017  . B12 deficiency 01/01/2017  . Trochanteric bursitis of left hip 10/08/2016  . Malabsorption of iron 05/09/2016  . History of gastric bypass 05/09/2016  . Arthritis of knee, degenerative 04/14/2016  . Surgical menopause 03/24/2016  . Family history of breast  cancer 03/24/2016  . Left knee pain 10/01/2015  . Hypoglycemia 06/12/2015  . Iron deficiency anemia 05/02/2015  . Cubital tunnel syndrome on right 09/14/2014  . Degenerative disc disease, cervical 07/27/2014  . Prediabetes 09/01/2013  . Fibromyalgia 04/11/2013  . Essential hypertension, benign 04/11/2013  . Anxiety state 04/11/2013   Past Medical History:  Diagnosis Date  . AK (actinic keratosis) 07/14/2017   Right upper chest wall  . Anemia   . Anemia, iron deficiency 05/02/2015  . Fibromyalgia   . History of gastric bypass 05/09/2016  . Hypertension   . Insomnia 08/14/2017  . Malabsorption of iron 05/09/2016  . PONV (postoperative nausea and vomiting)   . Seizure ([place]) 2018   from low blood  sugar   . Surgical menopause 03/24/2016   Family History  Problem Relation Age of Onset  . Diabetes Father   . Hypertension Father   . Stroke Father   . Heart failure Father   . CAD Father        has stents  . Breast cancer Mother   . Migraines Sister   . Colon cancer Neg Hx   . Colon polyps Neg Hx   . Esophageal cancer Neg Hx   . Stomach cancer Neg Hx   . Rectal cancer Neg Hx    Past Surgical History:  Procedure Laterality Date  . ABDOMINAL HYSTERECTOMY  1997  . APPENDECTOMY    . CESAREAN SECTION  [id number]  . CHOLECYSTECTOMY    . GASTRIC BYPASS  2011  . KNEE ARTHROSCOPY  2002  . TONSILLECTOMY  1977  . ULNAR NERVE TRANSPOSITION Right 06/12/2015   Procedure: RIGHT CUBITAL TUNNEL RELEASE;  Surgeon: Milly Jakob, MD;  Location: [facility];  Service: Orthopedics;  Laterality: Right;   Social History   Occupational History  . Occupation: [employer]  Tobacco Use  . Smoking status: Never Smoker  . Smokeless tobacco: Never Used  Substance and Sexual Activity  . Alcohol use: No  . Drug use: No  . Sexual activity: Never    Birth control/protection: Surgical

## 2019-11-17 NOTE — Telephone Encounter (Signed)
Spoke with Dr. Jaynee Eagles. Soonest availability is now with Dr. Jaynee Eagles on 7/12 @ 11:30 AM. I called pt and scheduled her. Also placed on wait list for Dr. Jaynee Eagles, Jinny Blossom, and Amy.

## 2019-11-23 MED FILL — ESCITALOPRAM 10 MG TABLET: 10 | 90 days supply | Qty: 90 | Fill #1

## 2019-11-24 ENCOUNTER — Encounter: Payer: Self-pay | Admitting: Neurology

## 2019-11-24 ENCOUNTER — Other Ambulatory Visit: Payer: Self-pay

## 2019-11-24 ENCOUNTER — Ambulatory Visit: Payer: No Typology Code available for payment source | Admitting: Neurology

## 2019-11-24 ENCOUNTER — Other Ambulatory Visit: Payer: Self-pay | Admitting: Neurology

## 2019-11-24 VITALS — BP 160/112 | HR 65 | Ht 64.0 in | Wt 212.0 lb

## 2019-11-24 DIAGNOSIS — M5481 Occipital neuralgia: Secondary | ICD-10-CM

## 2019-11-24 DIAGNOSIS — R519 Headache, unspecified: Secondary | ICD-10-CM

## 2019-11-24 MED ORDER — CYCLOBENZAPRINE HCL 10 MG PO TABS: 10.0000 mg | ORAL_TABLET | Freq: Three times a day (TID) | ORAL | 3 refills | Status: DC | PRN

## 2019-11-24 NOTE — Progress Notes (Signed)
[facility]    Provider:  Dr Jaynee Eagles Requesting Provider: [name], DO Primary Care Provider:  [name], DO  CC:  Occipital headaches and migraines  Last august she had occipital RFA and did great but it started hurting 2 months ago not as bad. She wants another RFA and working with Dr. Ernestina Patches. The impact of the RFA was tremendous, greatly impacted need for medical intervention, increased quality of life and work productivity. For 6 months had no pain. Now slowly progressively worsening, similar to before, severe radiation and pain last 1-2 days a week and worsening. She tried multiple things in the past (see below). Gabapentin did not work. She has new symptoms and feels her vision is impaired needs readers and when she has the headaches she has a lot of light sensitivity. Pounding/throbbing, nausea. Also sound sensitivity. 15 migraines. Discussed botox. Will give a muscle relaxer. Gabapentin did not work. Allergist to Lyrica. Discussed cymbalta.   HPI:  Stacy Woods is a 54 y.o. female here as requested by Dr. Leta Baptist as a second opinion  for headaches. She has fibromyalgia. She has had the headache for 4 weeks. It started with neck stiffness and pain. Dr. Georgina Snell. Muscle relaxers did not help. She had therapy and also dry needling and it did not help. She was diagnosed with occipital neuralgia by Dr. Georgina Snell. She also has a clicking in both of her ears, she hears clicking. Starts at the base and radiates up the back of the head but will radiate to behind the eyes. Nothing has helped so far. The past 4 weeks have been miserable. Continuous. 7/10 pain today, waxes and wanes. Stretching and PT did not help. Could try TCAs, cymbalta, like a knife in the back of her head. Wrapping head in a heat blanket helps. Sleeping on it makes it worse. Also neck tightness and soreness in the muscles. No inciting events. No prior changes in meds or injuries or viruses. She woke up with a  crick in her neck and then the headache started. No other focal neurologic deficits, associated symptoms, inciting events or modifiable factors.  Side effects to Lyrica in the past. Gabapentin didn't help Fibro but did not try it for this. Flexeril. Amitriptyline.   Review of Systems: Patient complains of symptoms per HPI as well as the following symptoms: occipital neuralgia . Pertinent negatives and positives per HPI. All others negative    Social History   Socioeconomic History  . Marital status: Divorced    Spouse name: Not on file  . Number of children: 2  . Years of education: Assoc  . Highest education level: Not on file  Occupational History  . Occupation: [employer]  Tobacco Use  . Smoking status: Never Smoker  . Smokeless tobacco: Never Used  Vaping Use  . Vaping Use: Never used  Substance and Sexual Activity  . Alcohol use: No  . Drug use: No  . Sexual activity: Never    Birth control/protection: Surgical  Other Topics Concern  . Not on file  Social History Narrative   Lives alone   Right-handed   Caffeine: <16 oz soda daily   Social Determinants of Health   Financial Resource Strain:   . Difficulty of Paying Living Expenses:   Food Insecurity:   . Worried About Charity fundraiser in the Last Year:   . Arboriculturist in the Last Year:   Transportation Needs:   . Film/video editor (Medical):   Marland Kitchen  Lack of Transportation (Non-Medical):   Physical Activity:   . Days of Exercise per Week:   . Minutes of Exercise per Session:   Stress:   . Feeling of Stress :   Social Connections:   . Frequency of Communication with Friends and Family:   . Frequency of Social Gatherings with Friends and Family:   . Attends Religious Services:   . Active Member of Clubs or Organizations:   . Attends Archivist Meetings:   Marland Kitchen Marital Status:   Intimate Partner Violence:   . Fear of Current or Ex-Partner:   . Emotionally Abused:   Marland Kitchen Physically Abused:   .  Sexually Abused:     Family History  Problem Relation Age of Onset  . Diabetes Father   . Hypertension Father   . Stroke Father   . Heart failure Father   . CAD Father        has stents  . Breast cancer Mother   . Migraines Sister   . Colon cancer Neg Hx   . Colon polyps Neg Hx   . Esophageal cancer Neg Hx   . Stomach cancer Neg Hx   . Rectal cancer Neg Hx     Past Medical History:  Diagnosis Date  . AK (actinic keratosis) 07/14/2017   Right upper chest wall  . Anemia   . Anemia, iron deficiency 05/02/2015  . Fibromyalgia   . History of gastric bypass 05/09/2016  . Hypertension   . Insomnia 08/14/2017  . Malabsorption of iron 05/09/2016  . PONV (postoperative nausea and vomiting)   . Seizure ([place]) 2018   from low blood sugar   . Surgical menopause 03/24/2016    Patient Active Problem List   Diagnosis Date Noted  . Morning joint stiffness 08/28/2019  . Constipation 08/28/2019  . Right shoulder pain 07/27/2019  . Glaucoma 07/21/2019  . Transaminitis 12/01/2017  . MDD (recurrent major depressive disorder) in remission ([place]) 11/03/2017  . Insomnia 08/14/2017  . AK (actinic keratosis) 07/14/2017  . Low magnesium level 01/12/2017  . B12 deficiency 01/01/2017  . Trochanteric bursitis of left hip 10/08/2016  . Malabsorption of iron 05/09/2016  . History of gastric bypass 05/09/2016  . Arthritis of knee, degenerative 04/14/2016  . Surgical menopause 03/24/2016  . Family history of breast cancer 03/24/2016  . Left knee pain 10/01/2015  . Hypoglycemia 06/12/2015  . Iron deficiency anemia 05/02/2015  . Cubital tunnel syndrome on right 09/14/2014  . Degenerative disc disease, cervical 07/27/2014  . Prediabetes 09/01/2013  . Fibromyalgia 04/11/2013  . Essential hypertension, benign 04/11/2013  . Anxiety state 04/11/2013    Past Surgical History:  Procedure Laterality Date  . ABDOMINAL HYSTERECTOMY  1997  . APPENDECTOMY    . CESAREAN SECTION  [id number]  .  CHOLECYSTECTOMY    . GASTRIC BYPASS  2011  . KNEE ARTHROSCOPY  2002  . TONSILLECTOMY  1977  . ULNAR NERVE TRANSPOSITION Right 06/12/2015   Procedure: RIGHT CUBITAL TUNNEL RELEASE;  Surgeon: Milly Jakob, MD;  Location: [facility];  Service: Orthopedics;  Laterality: Right;    Current Outpatient Medications  Medication Sig Dispense Refill  . AMBULATORY NON FORMULARY MEDICATION One Touch Glucometer per insurance formulary. One month of testing strips, and lancets used to check blood sugar daily. 1 Units prn  . blood glucose meter kit and supplies KIT Check fasting blood sugar daily. 1 each 0  . blood glucose meter kit and supplies Dispense based on patient and insurance preference.  Use up to four times daily as directed. (FOR ICD-10 E10.9, E11.9). 1 each 3  . escitalopram (LEXAPRO) 10 MG tablet TAKE 1 TABLET (10 MG TOTAL) BY MOUTH DAILY. 90 tablet 3  . glucagon 1 MG injection Inject 1 mg into the muscle once as needed for up to 1 dose. For low blood sugar 1 each 12  . glucose blood (IGLUCOSE TEST STRIPS) test strip Check fasting blood sugar 3-4 times daily. Dx: E16.2 100 each 11  . Lancets 30G MISC Check fasting blood sugar daily 50 each PRN  . lisinopril (ZESTRIL) 10 MG tablet Take 1 tablet (10 mg total) by mouth daily. 90 tablet 3  . zolpidem (AMBIEN) 5 MG tablet TAKE 1 TABLET BY MOUTH AT BEDTIME AS NEEDED FOR SLEEP 30 tablet 5  . cyclobenzaprine (FLEXERIL) 10 MG tablet Take 1 tablet (10 mg total) by mouth 3 (three) times daily as needed for muscle spasms. 90 tablet 3   No current facility-administered medications for this visit.    Allergies as of 11/24/2019 - Review Complete 11/24/2019  Allergen Reaction Noted  . Mobic [meloxicam] Swelling 03/07/2013  . Naproxen Other (See Comments) 08/25/2019  . Nsaids Swelling 08/03/2019  . Prednisone Other (See Comments) 08/03/2019  . Shellfish allergy Nausea Only and Swelling 03/07/2013  . Lyrica [pregabalin] Other (See Comments)  05/23/2014  . Percocet [oxycodone-acetaminophen] Itching 11/13/2015    Vitals: BP (!) 160/112 (BP Location: Right Arm, Patient Position: Sitting)   Pulse 65   Ht _0  (1.626 m)   Wt 212 lb (96.2 kg)   SpO2 98%   BMI 36.39 kg/m  Last Weight:  Wt Readings from Last 1 Encounters:  11/24/19 212 lb (96.2 kg)   Last Height:   Ht Readings from Last 1 Encounters:  11/24/19 _1  (1.626 m)     Physical exam: Exam: Gen: NAD, conversant, well nourised, obese, well groomed                     CV: RRR, no MRG. No Carotid Bruits. No peripheral edema, warm, nontender Eyes: Conjunctivae clear without exudates or hemorrhage  Neuro: Detailed Neurologic Exam  Speech:    Speech is normal; fluent and spontaneous with normal comprehension.  Cognition:    The patient is oriented to person, place, and time;     recent and remote memory intact;     language fluent;     normal attention, concentration,     fund of knowledge Cranial Nerves:    The pupils are equal, round, and reactive to light.  Visual fields are full to finger confrontation. Extraocular movements are intact. Trigeminal sensation is intact and the muscles of mastication are normal. The face is symmetric. The palate elevates in the midline. Hearing intact. Voice is normal. Shoulder shrug is normal. The tongue has normal motion without fasciculations.   Motor Observation:    No asymmetry, no atrophy, and no involuntary movements noted. Tone:    Normal muscle tone.    Posture:    Posture is normal. normal erect    Strength:    Strength is V/V in the upper and lower limbs.      Sensation: intact to LT      Assessment/Plan:  Patient of Dr. Leta Baptist who is here for follow up of occipital neurlgia who has done extremely well with RFA of the occipital nerves.  Patient reports for 6 months that she had absolutely no pain, it was a tremendous response, quality of life  improved, productivity improved, during those 6 months she  did not need to seek other medical treatments, ongoing for 6 months but now the symptoms are starting to return, I do feel that patient needs another radiofrequency ablation at this time that would be the absolute best possible treatment for patient for quality of life and to reduce her need for other procedures such as nerve blocks and other appointments such as neurology.Try flexeril at bedtime.   Meds ordered this encounter  Medications  . cyclobenzaprine (FLEXERIL) 10 MG tablet    Sig: Take 1 tablet (10 mg total) by mouth 3 (three) times daily as needed for muscle spasms.    Dispense:  90 tablet    Refill:  3    Cc: [name], DO,     Sarina Ill, MD  [facility] [address] [place], [place] [address]  Phone [phone] Fax [phone]  I spent 25 minutes of face-to-face and non-face-to-face time with patient on the  1. Occipital headache   2. Bilateral occipital neuralgia    diagnosis.  This included previsit chart review, lab review, study review, order entry, electronic health record documentation, patient education on the different diagnostic and therapeutic options, counseling and coordination of care, risks and benefits of management, compliance, or risk factor reduction

## 2019-11-24 NOTE — Patient Instructions (Signed)
Cyclobenzaprine tablets What is this medicine? CYCLOBENZAPRINE (sye kloe BEN za preen) is a muscle relaxer. It is used to treat muscle pain, spasms, and stiffness. This medicine may be used for other purposes; ask your health care provider or pharmacist if you have questions. COMMON BRAND NAME(S): Fexmid, Flexeril What should I tell my health care provider before I take this medicine? They need to know if you have any of these conditions:  heart disease, irregular heartbeat, or previous heart attack  liver disease  thyroid problem  an unusual or allergic reaction to cyclobenzaprine, tricyclic antidepressants, lactose, other medicines, foods, dyes, or preservatives  pregnant or trying to get pregnant  breast-feeding How should I use this medicine? Take this medicine by mouth with a glass of water. Follow the directions on the prescription label. If this medicine upsets your stomach, take it with food or milk. Take your medicine at regular intervals. Do not take it more often than directed. Talk to your pediatrician regarding the use of this medicine in children. Special care may be needed. Overdosage: If you think you have taken too much of this medicine contact a poison control center or emergency room at once. NOTE: This medicine is only for you. Do not share this medicine with others. What if I miss a dose? If you miss a dose, take it as soon as you can. If it is almost time for your next dose, take only that dose. Do not take double or extra doses. What may interact with this medicine? Do not take this medicine with any of the following medications:  MAOIs like Carbex, Eldepryl, Marplan, Nardil, and Parnate  narcotic medicines for cough  safinamide This medicine may also interact with the following medications:  alcohol  bupropion  antihistamines for allergy, cough and cold  certain medicines for anxiety or sleep  certain medicines for bladder problems like  oxybutynin, tolterodine  certain medicines for depression like amitriptyline, fluoxetine, sertraline  certain medicines for Parkinson's disease like benztropine, trihexyphenidyl  certain medicines for seizures like phenobarbital, primidone  certain medicines for stomach problems like dicyclomine, hyoscyamine  certain medicines for travel sickness like scopolamine  general anesthetics like halothane, isoflurane, methoxyflurane, propofol  ipratropium  local anesthetics like lidocaine, pramoxine, tetracaine  medicines that relax muscles for surgery  narcotic medicines for pain  phenothiazines like chlorpromazine, mesoridazine, prochlorperazine, thioridazine  verapamil This list may not describe all possible interactions. Give your health care provider a list of all the medicines, herbs, non-prescription drugs, or dietary supplements you use. Also tell them if you smoke, drink alcohol, or use illegal drugs. Some items may interact with your medicine. What should I watch for while using this medicine? Tell your doctor or health care professional if your symptoms do not start to get better or if they get worse. You may get drowsy or dizzy. Do not drive, use machinery, or do anything that needs mental alertness until you know how this medicine affects you. Do not stand or sit up quickly, especially if you are an older patient. This reduces the risk of dizzy or fainting spells. Alcohol may interfere with the effect of this medicine. Avoid alcoholic drinks. If you are taking another medicine that also causes drowsiness, you may have more side effects. Give your health care provider a list of all medicines you use. Your doctor will tell you how much medicine to take. Do not take more medicine than directed. Call emergency for help if you have problems breathing or unusual sleepiness.  Your mouth may get dry. Chewing sugarless gum or sucking hard candy, and drinking plenty of water may help.  Contact your doctor if the problem does not go away or is severe. What side effects may I notice from receiving this medicine? Side effects that you should report to your doctor or health care professional as soon as possible:  allergic reactions like skin rash, itching or hives, swelling of the face, lips, or tongue  breathing problems  chest pain  fast, irregular heartbeat  hallucinations  seizures  unusually weak or tired Side effects that usually do not require medical attention (report to your doctor or health care professional if they continue or are bothersome):  headache  nausea, vomiting This list may not describe all possible side effects. Call your doctor for medical advice about side effects. You may report side effects to FDA at 1-800-FDA-1088. Where should I keep my medicine? Keep out of the reach of children. Store at room temperature between 15 and 30 degrees C (59 and 86 degrees F). Keep container tightly closed. Throw away any unused medicine after the expiration date. NOTE: This sheet is a summary. It may not cover all possible information. If you have questions about this medicine, talk to your doctor, pharmacist, or health care provider.  2020 Elsevier/Gold Standard (2018-04-28 12:49:26)

## 2019-11-28 MED FILL — LISINOPRIL 10 MG TABS: 10 | 90 days supply | Qty: 90 | Fill #1

## 2019-12-05 ENCOUNTER — Encounter: Payer: Self-pay | Admitting: Physical Medicine and Rehabilitation

## 2019-12-05 ENCOUNTER — Encounter: Payer: Self-pay | Admitting: Neurology

## 2019-12-06 ENCOUNTER — Encounter: Payer: Self-pay | Admitting: Family Medicine

## 2019-12-06 MED FILL — ZOLPIDEM TARTRATE 5 MG TAB: 5 | 30 days supply | Qty: 30 | Fill #3

## 2019-12-07 ENCOUNTER — Other Ambulatory Visit: Payer: Self-pay | Admitting: Family Medicine

## 2019-12-07 DIAGNOSIS — M47812 Spondylosis without myelopathy or radiculopathy, cervical region: Secondary | ICD-10-CM

## 2019-12-19 ENCOUNTER — Institutional Professional Consult (permissible substitution): Payer: Self-pay | Admitting: Neurology

## 2020-01-04 ENCOUNTER — Institutional Professional Consult (permissible substitution): Payer: No Typology Code available for payment source | Admitting: Neurology

## 2020-01-09 MED FILL — ZOLPIDEM TARTRATE 5 MG TAB: 5 | 30 days supply | Qty: 30 | Fill #4

## 2020-01-20 ENCOUNTER — Telehealth: Payer: Self-pay | Admitting: Physical Medicine and Rehabilitation

## 2020-01-20 NOTE — Telephone Encounter (Signed)
Pt was returning a missed call  [phone]

## 2020-01-20 NOTE — Telephone Encounter (Signed)
Scheduled

## 2020-01-27 ENCOUNTER — Encounter: Payer: Self-pay | Admitting: Family Medicine

## 2020-01-27 DIAGNOSIS — R7303 Prediabetes: Secondary | ICD-10-CM

## 2020-01-27 DIAGNOSIS — D509 Iron deficiency anemia, unspecified: Secondary | ICD-10-CM

## 2020-01-27 NOTE — Telephone Encounter (Signed)
Labs for iron studies and A1c pended for provider's review.

## 2020-01-31 LAB — IRON,TIBC AND FERRITIN PANEL
%SAT: 26 % (calc) (ref 16–45)
Ferritin: 14 ng/mL — ABNORMAL LOW (ref 16–232)
Iron: 109 ug/dL (ref 45–160)
TIBC: 424 mcg/dL (calc) (ref 250–450)

## 2020-01-31 LAB — HEMOGLOBIN A1C
Hgb A1c MFr Bld: 5.6 % of total Hgb (ref <5.7)
Mean Plasma Glucose: 114 (calc)
eAG (mmol/L): 6.3 (calc)

## 2020-02-06 MED FILL — CYCLOBENZAPRINE HCL 10 MG T: 10 | 30 days supply | Qty: 90 | Fill #0

## 2020-02-06 MED FILL — ZOLPIDEM TARTRATE 5 MG TAB: 5 | 30 days supply | Qty: 30 | Fill #5

## 2020-02-16 ENCOUNTER — Ambulatory Visit: Payer: No Typology Code available for payment source | Admitting: Physical Medicine and Rehabilitation

## 2020-02-16 ENCOUNTER — Ambulatory Visit: Payer: Self-pay

## 2020-02-16 ENCOUNTER — Other Ambulatory Visit: Payer: Self-pay

## 2020-02-16 ENCOUNTER — Encounter: Payer: Self-pay | Admitting: Physical Medicine and Rehabilitation

## 2020-02-16 VITALS — BP 124/90 | HR 75

## 2020-02-16 DIAGNOSIS — M5481 Occipital neuralgia: Secondary | ICD-10-CM

## 2020-02-16 DIAGNOSIS — M542 Cervicalgia: Secondary | ICD-10-CM | POA: Diagnosis not present

## 2020-02-16 DIAGNOSIS — M47812 Spondylosis without myelopathy or radiculopathy, cervical region: Secondary | ICD-10-CM | POA: Diagnosis not present

## 2020-02-16 MED ORDER — METHYLPREDNISOLONE ACETATE 80 MG/ML IJ SUSP
40.0000 mg | Freq: Once | INTRAMUSCULAR | Status: AC
  Administered 2020-02-16: 40 mg

## 2020-02-16 NOTE — Progress Notes (Signed)
Pt state neck and head pain. Pt state turning her neck and sitting st a computer and lifting makes the pain worse. Pt state she try to relax and use ice pad for sum relief.  Numeric Pain Rating Scale and Functional Assessment Average Pain 8   In the last MONTH (on 0-10 scale) has pain interfered with the following?  1. General activity like being  able to carry out your everyday physical activities such as walking, climbing stairs, carrying groceries, or moving a chair?  Rating(10)   +Driver, -BT, -Dye Allergies.

## 2020-02-17 NOTE — Progress Notes (Signed)
Stacy Woods - 54 y.o. female MRN [id number]  Date of birth: [birth year]  Office Visit Note: Visit Date: 02/16/2020 PCP: [name], DO Referred by: [name], DO  Subjective: Chief Complaint  Patient presents with  . Neck - Pain  . Fall   HPI:  Stacy Woods is a 54 y.o. female who comes in today for planned repeat radiofrequency ablation of the Bilateral C2-3 Cervical facet joints. This would be ablation of the corresponding medial branches and/or dorsal rami.  Patient has had double diagnostic blocks with more than 70% relief.  Subsequent ablation gave them more than 6 months of over 60% relief.  They have had chronic neck and occipital pain for quite some time, more than 3 months, which has been an ongoing situation with recalcitrant axial neck and occipital pain.  They have no radicular pain.  Their axial pain is worse  on exam today with facet loading.  They have had physical therapy as well as home exercise program.  The imaging noted in the chart below indicated facet pathology. Accordingly they meet all the criteria and qualification for for repeat radiofrequency ablation and we are going to complete this today hopefully for more longer term relief as part of comprehensive management program.  Please see our prior office notes and prior procedure notes for further details and justification.  ROS Otherwise per HPI.  Assessment & Plan: Visit Diagnoses:  1. Cervical spondylosis without myelopathy   2. Cervicalgia   3. Bilateral occipital neuralgia     Plan: No additional findings.   Meds & Orders:  Meds ordered this encounter  Medications  . methylPREDNISolone acetate (DEPO-MEDROL) injection 40 mg    Orders Placed This Encounter  Procedures  . Radiofrequency,Cervical  . XR C-ARM NO REPORT    Follow-up: Return if symptoms worsen or fail to improve.   Procedures: No procedures performed  Cervical Facet Nerve Denervation  Patient: Stacy Woods      Date of  Birth: Mar 11, 1966 MRN: [id number] PCP: [name], DO      Visit Date: 02/16/2020   Universal Protocol:    Date/Time: 09/10/215:45 AM  Consent Given By: the patient  Position: PRONE  Additional Comments: Vital signs were monitored before and after the procedure. Patient was prepped and draped in the usual sterile fashion. The correct patient, procedure, and site was verified.   Injection Procedure Details:  Procedure Site One Meds Administered:  Meds ordered this encounter  Medications  . methylPREDNISolone acetate (DEPO-MEDROL) injection 40 mg     Laterality: Bilateral  Location/Site:  C2-3  Needle size: 18 G  Needle type: RF cannula, 5 mm active tip  Findings:  -Comments:   Procedure Details: The fluoroscope beam was positioned to square off the endplates of the desired vertebral level to achieve a true AP position. The beam was then moved in a small "counter" oblique to the contralateral side with a small amount of caudal tilt to achieve a trajectory alignment with the desired nerves.  For each target described below the skin was anesthetized with 1 ml of 1% Lidocaine without epinephrine.  To denervate the facet joint nerve to C2 (third occipital nerve), the cannula was advanced under fluoroscopic guidance and positioned over the inferior lateral portion of the C2/3 facet joint nerve where the third occipital nerve lies.  A minimum of three lesions were made along the location of the nerve.  For all of these levels, AP and lateral images were used to confirm  location.  The radiofrequency probe was inserted into the cannula and stimulation was carried out at both sensory and motor levels to make sure there was expected stimulation without a radicular pattern. Subsequently, this was removed and then 0.5 to 1 ml. of 1% Lidocaine was injected. The radiofrequency probe was re-inserted and denervation of the facet nerves (medial branches of the dorsal rami innervating the  facet joints) was carried out at 80 degrees Celsius for 90 seconds. The above procedure was repeated for each facet joint nerve mentioned above. Radiographs were obtained at each level (unless otherwise noted) to verify probe placement during the neurotomy.  Additional Comments:  The patient tolerated the procedure well Dressing: Band-Aid    Post-procedure details: Patient was observed during the procedure. Post-procedure instructions were reviewed. Patient left the clinic in stable condition.       Clinical History: No specialty comments available.     Objective:  VS:  HT:    WT:   BMI:     BP:124/90  HR:75bpm  TEMP: ( )  RESP:  Physical Exam Vitals and nursing note reviewed.  Constitutional:      General: She is not in acute distress.    Appearance: Normal appearance. She is not ill-appearing.  HENT:     Head: Normocephalic and atraumatic.     Right Ear: External ear normal.     Left Ear: External ear normal.  Eyes:     Extraocular Movements: Extraocular movements intact.  Cardiovascular:     Rate and Rhythm: Normal rate.     Pulses: Normal pulses.  Musculoskeletal:     Cervical back: Tenderness present. No rigidity.     Right lower leg: No edema.     Left lower leg: No edema.     Comments: Patient has good strength in the upper extremities including 5 out of 5 strength in wrist extension long finger flexion and APB.  There is no atrophy of the hands intrinsically.  There is a negative Hoffmann's test. There is concordant neck and upper cervical occipital pain with extension rotation and facet loading.  Lymphadenopathy:     Cervical: No cervical adenopathy.  Skin:    Findings: No erythema, lesion or rash.  Neurological:     General: No focal deficit present.     Mental Status: She is alert and oriented to person, place, and time.     Sensory: No sensory deficit.     Motor: No weakness or abnormal muscle tone.     Coordination: Coordination normal.    Psychiatric:        Mood and Affect: Mood normal.        Behavior: Behavior normal.      Imaging: XR C-ARM NO REPORT  Result Date: 02/16/2020 Please see Notes tab for imaging impression.

## 2020-02-17 NOTE — Procedures (Signed)
Cervical Facet Nerve Denervation  Patient: Stacy Woods      Date of Birth: 20-May-1966 MRN: [id number] PCP: [name], DO      Visit Date: 02/16/2020   Universal Protocol:    Date/Time: 09/10/215:45 AM  Consent Given By: the patient  Position: PRONE  Additional Comments: Vital signs were monitored before and after the procedure. Patient was prepped and draped in the usual sterile fashion. The correct patient, procedure, and site was verified.   Injection Procedure Details:  Procedure Site One Meds Administered:  Meds ordered this encounter  Medications  . methylPREDNISolone acetate (DEPO-MEDROL) injection 40 mg     Laterality: Bilateral  Location/Site:  C2-3  Needle size: 18 G  Needle type: RF cannula, 5 mm active tip  Findings:  -Comments:   Procedure Details: The fluoroscope beam was positioned to square off the endplates of the desired vertebral level to achieve a true AP position. The beam was then moved in a small "counter" oblique to the contralateral side with a small amount of caudal tilt to achieve a trajectory alignment with the desired nerves.  For each target described below the skin was anesthetized with 1 ml of 1% Lidocaine without epinephrine.  To denervate the facet joint nerve to C2 (third occipital nerve), the cannula was advanced under fluoroscopic guidance and positioned over the inferior lateral portion of the C2/3 facet joint nerve where the third occipital nerve lies.  A minimum of three lesions were made along the location of the nerve.  For all of these levels, AP and lateral images were used to confirm location.  The radiofrequency probe was inserted into the cannula and stimulation was carried out at both sensory and motor levels to make sure there was expected stimulation without a radicular pattern. Subsequently, this was removed and then 0.5 to 1 ml. of 1% Lidocaine was injected. The radiofrequency probe was re-inserted and denervation  of the facet nerves (medial branches of the dorsal rami innervating the facet joints) was carried out at 80 degrees Celsius for 90 seconds. The above procedure was repeated for each facet joint nerve mentioned above. Radiographs were obtained at each level (unless otherwise noted) to verify probe placement during the neurotomy.  Additional Comments:  The patient tolerated the procedure well Dressing: Band-Aid    Post-procedure details: Patient was observed during the procedure. Post-procedure instructions were reviewed. Patient left the clinic in stable condition.

## 2020-02-20 MED FILL — ESCITALOPRAM 10 MG TABLET: 10 | 90 days supply | Qty: 90 | Fill #2

## 2020-03-01 ENCOUNTER — Other Ambulatory Visit: Payer: Self-pay | Admitting: Family Medicine

## 2020-03-01 MED FILL — LISINOPRIL 10 MG TABS: 10 | 30 days supply | Qty: 30 | Fill #0

## 2020-03-01 MED FILL — ZOLPIDEM TARTRATE 5 MG TAB: 5 | 30 days supply | Qty: 30 | Fill #0

## 2020-03-02 ENCOUNTER — Other Ambulatory Visit: Payer: Self-pay

## 2020-03-02 ENCOUNTER — Telehealth: Payer: Self-pay | Admitting: Family Medicine

## 2020-03-02 ENCOUNTER — Ambulatory Visit (INDEPENDENT_AMBULATORY_CARE_PROVIDER_SITE_OTHER): Payer: No Typology Code available for payment source | Admitting: Family Medicine

## 2020-03-02 VITALS — Temp 98.0°F

## 2020-03-02 DIAGNOSIS — Z23 Encounter for immunization: Secondary | ICD-10-CM

## 2020-03-02 NOTE — Telephone Encounter (Signed)
This is ok with me if ok with Joy.

## 2020-03-02 NOTE — Progress Notes (Signed)
Patient here for flu vaccine, patient tolerated injection well.

## 2020-03-02 NOTE — Telephone Encounter (Signed)
Patient States: Pt was seeing Dr.Corey until Dr.Corey left and then Patient was put with Dr.Matthews but she feels like she needs a better connection and would like to switch her care to Samuel Bouche, NP..... Please let me know this is okay for me to schedule her to F/u with Joy going forward. Thank you both

## 2020-03-02 NOTE — Telephone Encounter (Signed)
That is fine with me.

## 2020-03-02 NOTE — Progress Notes (Signed)
Medical screening examination/treatment was performed by qualified clinical staff member and as supervising physician I was immediately available for consultation/collaboration. I have reviewed documentation and agree with assessment and plan.  Nicolaos Mitrano, DO  

## 2020-03-06 NOTE — Telephone Encounter (Signed)
Scheduled pt with Joy to switch pcp

## 2020-03-12 NOTE — Progress Notes (Signed)
Subjective:    CC: establish care with new PCP, discuss labs  HPI: Pleasant 54 year old female presenting today to establish care with a new PCP.  She would like to discuss her recent lab results in regards to fatigue.  Notes that she had a low B12 in March and has not had that retested since she started taking oral B12 1000 mcg/day.  Recent labs checked near the end of August showed a normal hemoglobin A1c and slightly improving ferritin.  She is taking over-the-counter iron supplementation once daily.  She is having some difficulty with the GI side effects of the iron so she takes a stool softener with it every day.  Notes that she had a hysterectomy in her 47s and was on hormone replacement therapy for about 5 years.  She has not taken HRT for a long time and notes that she has occasional hot flashes.  Hot flashes used to be related to low blood sugar but recently lately she has checked her sugar during the hot flash and it was within normal range.  She had a gastric bypass in 2011 where she lost 120+ pounds.  Over the last 5 years, she has gained approximately 50 pounds back.  She is walking for exercise 6-7 times days per week for about 30 minutes each episode.  She also keeps up with her young grandchildren.  Notes that she is had hair thinning, dry skin, and easy bruising.  Notes that her overall fatigue and symptoms do feel different from a fibromyalgia type flare.  Lately she has noticed some difficulty with sleeping past 2 AM.  She takes her Ambien and falls asleep every night without difficulty but wakes around 2 AM most nights.  Admits to loud snoring, excessive daytime sleepiness/fatigue, witnessed apneic episodes, and a history of hypertension.  She has not had a sleep study recently but does endorse having 1 years ago.  Has never used a CPAP machine.  I reviewed the past medical history, family history, social history, surgical history, and allergies today and no changes were needed.  Please  see the problem list section below in epic for further details.  Past Medical History: Past Medical History:  Diagnosis Date  . AK (actinic keratosis) 07/14/2017   Right upper chest wall  . Anemia   . Anemia, iron deficiency 05/02/2015  . Fibromyalgia   . History of gastric bypass 05/09/2016  . Hypertension   . Insomnia 08/14/2017  . Malabsorption of iron 05/09/2016  . PONV (postoperative nausea and vomiting)   . Prediabetes 09/01/2013   March 2015   . Seizure ([place]) 2018   from low blood sugar   . Surgical menopause 03/24/2016   Past Surgical History: Past Surgical History:  Procedure Laterality Date  . ABDOMINAL HYSTERECTOMY  1997  . APPENDECTOMY    . CESAREAN SECTION  [id number]  . CHOLECYSTECTOMY    . GASTRIC BYPASS  2011  . KNEE ARTHROSCOPY  2002  . TONSILLECTOMY  1977  . ULNAR NERVE TRANSPOSITION Right 06/12/2015   Procedure: RIGHT CUBITAL TUNNEL RELEASE;  Surgeon: Milly Jakob, MD;  Location: [facility];  Service: Orthopedics;  Laterality: Right;   Social History: Social History   Socioeconomic History  . Marital status: Divorced    Spouse name: Not on file  . Number of children: 2  . Years of education: Assoc  . Highest education level: Not on file  Occupational History  . Occupation: [employer]  Tobacco Use  . Smoking status: Never Smoker  .  Smokeless tobacco: Never Used  Vaping Use  . Vaping Use: Never used  Substance and Sexual Activity  . Alcohol use: No  . Drug use: No  . Sexual activity: Not Currently    Birth control/protection: Surgical    Comment: Hysterectomy  Other Topics Concern  . Not on file  Social History Narrative   Lives alone   Right-handed   Caffeine: <16 oz soda daily   Social Determinants of Health   Financial Resource Strain:   . Difficulty of Paying Living Expenses: Not on file  Food Insecurity:   . Worried About Charity fundraiser in the Last Year: Not on file  . Ran Out of Food in the Last Year: Not on file   Transportation Needs:   . Lack of Transportation (Medical): Not on file  . Lack of Transportation (Non-Medical): Not on file  Physical Activity:   . Days of Exercise per Week: Not on file  . Minutes of Exercise per Session: Not on file  Stress:   . Feeling of Stress : Not on file  Social Connections:   . Frequency of Communication with Friends and Family: Not on file  . Frequency of Social Gatherings with Friends and Family: Not on file  . Attends Religious Services: Not on file  . Active Member of Clubs or Organizations: Not on file  . Attends Archivist Meetings: Not on file  . Marital Status: Not on file   Family History: Family History  Problem Relation Age of Onset  . Diabetes Father   . Hypertension Father   . Stroke Father   . Heart failure Father   . CAD Father        has stents  . Breast cancer Mother   . Migraines Sister   . Colon cancer Neg Hx   . Colon polyps Neg Hx   . Esophageal cancer Neg Hx   . Stomach cancer Neg Hx   . Rectal cancer Neg Hx    Allergies: Allergies  Allergen Reactions  . Mobic [Meloxicam] Swelling  . Naproxen Other (See Comments)    Swelling   . Nsaids Swelling    Swelling   . Prednisone Other (See Comments)    Swelling, blood sugar problems  . Shellfish Allergy Nausea Only and Swelling  . Lyrica [Pregabalin] Other (See Comments)    "zombie"  . Percocet [Oxycodone-Acetaminophen] Itching   Medications: See med rec.  Review of Systems: See HPI for pertinent positives and negatives.   Objective:    General: Well Developed, well nourished, and in no acute distress.  Neuro: Alert and oriented x3.  HEENT: Normocephalic, atraumatic.  Skin: Warm and dry. Cardiac: Regular rate and rhythm, no murmurs rubs or gallops, no lower extremity edema.  Respiratory: Clear to auscultation bilaterally. Not using accessory muscles, speaking in full sentences.   Impression and Recommendations:    1. Encounter to establish  care Reviewed available information and discussed health care concerns with the patient.  2. Need for hepatitis C screening test Discussed screening recommendations.  She is agreeable to this so we are adding to blood work today. - Hepatitis C antibody  3. Screening for HIV (human immunodeficiency virus) Discussed many recommendations.  She is agreeable to this as well so we are adding this to blood work today. - HIV Antibody (routine testing w rflx)  4. Weight gain/fatigue/loud snoring Suspect that excess calories has a part to play here but also consider elevated cortisol with poor sleep and  high stress.  We will get a sleep study to see if sleep apnea is present.  If so we will plan to give her a CPAP machine to hopefully improve the quality of her sleep.  Also rechecking B12 and folate.  Ferritin was low on last check but had improved from previous reading.  We will look to recheck this in another 8 weeks or so. - Nocturnal polysomnography (NPSG); Future  Return if symptoms worsen or fail to improve.  Further follow-up instructions pending results of sleep study and blood work. ___________________________________________ Clearnce Sorrel, DNP, APRN, FNP-BC [facility] and [place]

## 2020-03-13 ENCOUNTER — Ambulatory Visit (INDEPENDENT_AMBULATORY_CARE_PROVIDER_SITE_OTHER): Payer: No Typology Code available for payment source | Admitting: Medical-Surgical

## 2020-03-13 ENCOUNTER — Encounter: Payer: Self-pay | Admitting: Medical-Surgical

## 2020-03-13 VITALS — BP 109/76 | HR 72 | Temp 98.0°F | Ht 64.0 in | Wt 213.2 lb

## 2020-03-13 DIAGNOSIS — R5383 Other fatigue: Secondary | ICD-10-CM

## 2020-03-13 DIAGNOSIS — R635 Abnormal weight gain: Secondary | ICD-10-CM | POA: Diagnosis not present

## 2020-03-13 DIAGNOSIS — Z1159 Encounter for screening for other viral diseases: Secondary | ICD-10-CM | POA: Diagnosis not present

## 2020-03-13 DIAGNOSIS — Z7689 Persons encountering health services in other specified circumstances: Secondary | ICD-10-CM

## 2020-03-13 DIAGNOSIS — Z114 Encounter for screening for human immunodeficiency virus [HIV]: Secondary | ICD-10-CM

## 2020-03-13 DIAGNOSIS — R0683 Snoring: Secondary | ICD-10-CM

## 2020-03-14 LAB — B12 AND FOLATE PANEL
Folate: 8.9 ng/mL
Vitamin B-12: 439 pg/mL (ref 200–1100)

## 2020-03-14 LAB — HIV ANTIBODY (ROUTINE TESTING W REFLEX): HIV 1&2 Ab, 4th Generation: NONREACTIVE

## 2020-03-14 LAB — HEPATITIS C ANTIBODY
Hepatitis C Ab: NONREACTIVE
SIGNAL TO CUT-OFF: 0.02 (ref <1.00)

## 2020-03-15 ENCOUNTER — Encounter: Payer: Self-pay | Admitting: Physical Medicine and Rehabilitation

## 2020-03-27 ENCOUNTER — Other Ambulatory Visit (HOSPITAL_BASED_OUTPATIENT_CLINIC_OR_DEPARTMENT_OTHER): Payer: Self-pay | Admitting: Internal Medicine

## 2020-03-27 ENCOUNTER — Ambulatory Visit: Payer: No Typology Code available for payment source | Attending: Internal Medicine

## 2020-03-27 DIAGNOSIS — Z23 Encounter for immunization: Secondary | ICD-10-CM

## 2020-03-27 NOTE — Progress Notes (Signed)
   Covid-19 Vaccination Clinic  Name:  LATIQUA DALOIA    MRN: [id number] DOB: 1965/07/26  03/27/2020  Ms. Credeur was observed post Covid-19 immunization for 15 minutes without incident. She was provided with Vaccine Information Sheet and instruction to access the V-Safe system.   Ms. Avellino was instructed to call 911 with any severe reactions post vaccine: Marland Kitchen Difficulty breathing  . Swelling of face and throat  . A fast heartbeat  . A bad rash all over body  . Dizziness and weakness

## 2020-03-28 ENCOUNTER — Encounter: Payer: Self-pay | Admitting: Medical-Surgical

## 2020-03-28 ENCOUNTER — Other Ambulatory Visit: Payer: Self-pay | Admitting: Medical-Surgical

## 2020-03-28 DIAGNOSIS — R0683 Snoring: Secondary | ICD-10-CM

## 2020-03-28 DIAGNOSIS — R5383 Other fatigue: Secondary | ICD-10-CM

## 2020-03-28 DIAGNOSIS — R635 Abnormal weight gain: Secondary | ICD-10-CM

## 2020-03-28 NOTE — Progress Notes (Signed)
This encounter was created in error - please disregard.

## 2020-03-29 ENCOUNTER — Other Ambulatory Visit: Payer: Self-pay | Admitting: Medical-Surgical

## 2020-03-29 NOTE — Progress Notes (Signed)
Clarifying sleep study order during this encounter.

## 2020-04-02 MED FILL — ZOLPIDEM TARTRATE 5 MG TAB: 5 | 30 days supply | Qty: 30 | Fill #1

## 2020-04-03 ENCOUNTER — Other Ambulatory Visit: Payer: Self-pay | Admitting: Family Medicine

## 2020-04-03 ENCOUNTER — Other Ambulatory Visit: Payer: Self-pay | Admitting: Medical-Surgical

## 2020-04-03 MED FILL — LISINOPRIL 10 MG TABS: 10 | 90 days supply | Qty: 90 | Fill #0

## 2020-04-03 MED FILL — PFIZER-BIONTECH COVID-19 VA: 30 | 1 days supply | Qty: 0 | Fill #0

## 2020-04-03 NOTE — Telephone Encounter (Signed)
Forwarding to PCP for review.  

## 2020-04-13 ENCOUNTER — Ambulatory Visit (INDEPENDENT_AMBULATORY_CARE_PROVIDER_SITE_OTHER): Payer: No Typology Code available for payment source | Admitting: Medical-Surgical

## 2020-04-13 ENCOUNTER — Encounter: Payer: Self-pay | Admitting: Medical-Surgical

## 2020-04-13 VITALS — BP 126/90 | HR 73 | Temp 98.5°F | Ht 64.0 in | Wt 219.4 lb

## 2020-04-13 DIAGNOSIS — M542 Cervicalgia: Secondary | ICD-10-CM | POA: Diagnosis not present

## 2020-04-13 MED ORDER — DEXAMETHASONE 4 MG PO TABS: 4.0000 mg | ORAL_TABLET | Freq: Three times a day (TID) | ORAL | 0 refills | Status: DC

## 2020-04-13 MED ORDER — KETOROLAC TROMETHAMINE 60 MG/2ML IM SOLN
60.0000 mg | Freq: Once | INTRAMUSCULAR | Status: AC
  Administered 2020-04-13: 60 mg via INTRAMUSCULAR

## 2020-04-13 MED ORDER — TRAMADOL HCL 50 MG PO TABS: 50.0000 mg | ORAL_TABLET | Freq: Two times a day (BID) | ORAL | 0 refills | Status: DC | PRN

## 2020-04-13 NOTE — Progress Notes (Signed)
Subjective:    CC: neck/arm pain  HPI: Stacy Woods 54 year old female presenting for neck and arm pain. She has been dealing with chronic neck pain for years but recently developed acute pain along the midline of the cervical spine. She is having a burning pain at the bottom of the cervical/top of the thoracic spine and the surrounding muscles. The pain extends down the left side of the neck to the shoulder. There is no numbness to the neck, shoulder, arm, or hand but there is tingling along the radial side of the left forearm. No weakness in the arms. Has tried ice, heat, rest, Advil, Tylenol, and a TENS unit. None of this has been helpful. Having difficulty with regular activities due to pain limiting her head movement. No fever, chills, shortness of breath, or chest pain. Denies falls and neck trauma.  I reviewed the past medical history, family history, social history, surgical history, and allergies today and no changes were needed.  Please see the problem list section below in epic for further details.  Past Medical History: Past Medical History:  Diagnosis Date  . AK (actinic keratosis) 07/14/2017   Right upper chest wall  . Anemia   . Anemia, iron deficiency 05/02/2015  . Fibromyalgia   . History of gastric bypass 05/09/2016  . Hypertension   . Insomnia 08/14/2017  . Malabsorption of iron 05/09/2016  . PONV (postoperative nausea and vomiting)   . Prediabetes 09/01/2013   March 2015   . Seizure ([place]) 2018   from low blood sugar   . Surgical menopause 03/24/2016   Past Surgical History: Past Surgical History:  Procedure Laterality Date  . ABDOMINAL HYSTERECTOMY  1997  . APPENDECTOMY    . CESAREAN SECTION  [id number]  . CHOLECYSTECTOMY    . GASTRIC BYPASS  2011  . KNEE ARTHROSCOPY  2002  . TONSILLECTOMY  1977  . ULNAR NERVE TRANSPOSITION Right 06/12/2015   Procedure: RIGHT CUBITAL TUNNEL RELEASE;  Surgeon: Milly Jakob, MD;  Location: [facility];  Service:  Orthopedics;  Laterality: Right;   Social History: Social History   Socioeconomic History  . Marital status: Divorced    Spouse name: Not on file  . Number of children: 2  . Years of education: Assoc  . Highest education level: Not on file  Occupational History  . Occupation: [employer]  Tobacco Use  . Smoking status: Never Smoker  . Smokeless tobacco: Never Used  Vaping Use  . Vaping Use: Never used  Substance and Sexual Activity  . Alcohol use: No  . Drug use: No  . Sexual activity: Not Currently    Birth control/protection: Surgical    Comment: Hysterectomy  Other Topics Concern  . Not on file  Social History Narrative   Lives alone   Right-handed   Caffeine: <16 oz soda daily   Social Determinants of Health   Financial Resource Strain:   . Difficulty of Paying Living Expenses: Not on file  Food Insecurity:   . Worried About Charity fundraiser in the Last Year: Not on file  . Ran Out of Food in the Last Year: Not on file  Transportation Needs:   . Lack of Transportation (Medical): Not on file  . Lack of Transportation (Non-Medical): Not on file  Physical Activity:   . Days of Exercise per Week: Not on file  . Minutes of Exercise per Session: Not on file  Stress:   . Feeling of Stress : Not on file  Social Connections:   .  Frequency of Communication with Friends and Family: Not on file  . Frequency of Social Gatherings with Friends and Family: Not on file  . Attends Religious Services: Not on file  . Active Member of Clubs or Organizations: Not on file  . Attends Archivist Meetings: Not on file  . Marital Status: Not on file   Family History: Family History  Problem Relation Age of Onset  . Diabetes Father   . Hypertension Father   . Stroke Father   . Heart failure Father   . CAD Father        has stents  . Breast cancer Mother   . Migraines Sister   . Colon cancer Neg Hx   . Colon polyps Neg Hx   . Esophageal cancer Neg Hx   . Stomach  cancer Neg Hx   . Rectal cancer Neg Hx    Allergies: Allergies  Allergen Reactions  . Mobic [Meloxicam] Swelling  . Naproxen Other (See Comments)    Swelling   . Nsaids Swelling    Swelling   . Prednisone Other (See Comments)    Swelling, blood sugar problems  . Shellfish Allergy Nausea Only and Swelling  . Lyrica [Pregabalin] Other (See Comments)    "zombie"  . Percocet [Oxycodone-Acetaminophen] Itching   Medications: See med rec.  Review of Systems: See HPI for pertinent positives and negatives.   Objective:    General: Well Developed, well nourished, and in no acute distress.  Neuro: Alert and oriented x3.  HEENT: Normocephalic, atraumatic.  Skin: Warm and dry. Cardiac: Regular rate and rhythm, no murmurs rubs or gallops, no lower extremity edema.  Respiratory: Clear to auscultation bilaterally. Not using accessory muscles, speaking in full sentences. MSK: Midline tenderness to C3-4 and C6-7. Muscle tenderness over the upper thoracic paraspinal muscles and the neck muscles along the left side of the neck extending along the trapezius to the left shoulder. Full ROM of BUE. Neck ROM severely limited by pain in all planes.   Impression and Recommendations:    1. Cervicalgia Reviewed x-ray and MRI completed in 2020. Looks like slightly bulging discs at C3-4 and C6-7. Toradol 60mg  IM in office. Sending in Decadron 4mg  TID x 5 days. Continue conservative treatments with heat, ice, Tylenol, and TENS unit. Referring to PT. If no improvement in 2 weeks, return for further evaluation by Dr. Darene Lamer. - Ambulatory referral to Physical Therapy - ketorolac (TORADOL) injection 60 mg  Return if symptoms worsen or fail to improve. ___________________________________________ Clearnce Sorrel, DNP, APRN, FNP-BC [facility] and [place]

## 2020-04-19 ENCOUNTER — Ambulatory Visit (INDEPENDENT_AMBULATORY_CARE_PROVIDER_SITE_OTHER): Payer: No Typology Code available for payment source | Admitting: Rehabilitative and Restorative Service Providers"

## 2020-04-19 ENCOUNTER — Other Ambulatory Visit: Payer: Self-pay

## 2020-04-19 ENCOUNTER — Encounter: Payer: Self-pay | Admitting: Rehabilitative and Restorative Service Providers"

## 2020-04-19 DIAGNOSIS — R29898 Other symptoms and signs involving the musculoskeletal system: Secondary | ICD-10-CM

## 2020-04-19 DIAGNOSIS — R293 Abnormal posture: Secondary | ICD-10-CM | POA: Diagnosis not present

## 2020-04-19 DIAGNOSIS — M542 Cervicalgia: Secondary | ICD-10-CM

## 2020-04-19 NOTE — Patient Instructions (Signed)
Access Code: [email or web address]: [email or web address]: 11/11/2021Prepared by: Donzel Romack HoltExercises  Seated Cervical Retraction - 2 x daily - 7 x weekly - 1-2 sets - 5-10 reps - 10 sec hold  Seated Scapular Retraction - 2 x daily - 7 x weekly - 1-2 sets - 10 reps - 10 sec hold  Shoulder External Rotation and Scapular Retraction - 2 x daily - 7 x weekly - 1 sets - 3 reps - 30 sec hold  Shoulder External Rotation in 45 Degrees Abduction - 2 x daily - 7 x weekly - 1-2 sets - 10 reps - 3 sec hold  Doorway Pec Stretch at 60 Degrees Abduction - 3 x daily - 7 x weekly - 3 reps - 1 sets  Doorway Pec Stretch at 90 Degrees Abduction - 3 x daily - 7 x weekly - 3 reps - 1 sets - 30 seconds hold  Doorway Pec Stretch at 120 Degrees Abduction - 3 x daily - 7 x weekly - 3 reps - 1 sets - 30 second hold hold

## 2020-04-19 NOTE — Therapy (Addendum)
[facility]  [address] [place] [place], [place], [address] Phone: [phone]   Fax:  [phone]  Physical Therapy Treatment and Discharge  Patient Details  Name: Stacy Woods MRN: [id number] Date of Birth: 11/23/1965 Referring Provider (PT): Samuel Bouche, NP   Encounter Date: 04/19/2020   PT End of Session - 04/19/20 1014    Visit Number 1    Number of Visits 12    Date for PT Re-Evaluation 05/31/20    PT Start Time 0930    PT Stop Time 1015    PT Time Calculation (min) 45 min    Activity Tolerance Patient tolerated treatment well           Past Medical History:  Diagnosis Date  . AK (actinic keratosis) 07/14/2017   Right upper chest wall  . Anemia   . Anemia, iron deficiency 05/02/2015  . Fibromyalgia   . History of gastric bypass 05/09/2016  . Hypertension   . Insomnia 08/14/2017  . Malabsorption of iron 05/09/2016  . PONV (postoperative nausea and vomiting)   . Prediabetes 09/01/2013   March 2015   . Seizure ([place]) 2018   from low blood sugar   . Surgical menopause 03/24/2016    Past Surgical History:  Procedure Laterality Date  . ABDOMINAL HYSTERECTOMY  1997  . APPENDECTOMY    . CESAREAN SECTION  [id number]  . CHOLECYSTECTOMY    . GASTRIC BYPASS  2011  . KNEE ARTHROSCOPY  2002  . TONSILLECTOMY  1977  . ULNAR NERVE TRANSPOSITION Right 06/12/2015   Procedure: RIGHT CUBITAL TUNNEL RELEASE;  Surgeon: Milly Jakob, MD;  Location: [facility];  Service: Orthopedics;  Laterality: Right;    There were no vitals filed for this visit.   Subjective Assessment - 04/19/20 0941    Subjective Patient reports flare up of neck pain ~ 4 weeks ago. She has ablasion 02/16/20 with some improvement in the upper cervical area and headaches. This was the second time for the ablasion. She is now having pain in the lower cervical area. History of fibromyalgia; diabetic seizure 2017.    Pertinent History HTN; chronic  cervical pain for ~ 2-3 years; LBP for years(uses inversioin table for LBP weekly); headaches; AODM    Patient Stated Goals get rid of some of the pain    Currently in Pain? Yes    Pain Score 7     Pain Location Neck    Pain Orientation Lower;Left    Pain Descriptors / Indicators Throbbing;Burning    Pain Type Acute pain;Chronic pain    Pain Radiating Towards Lt shoulder    Pain Onset More than a month ago    Pain Frequency Constant    Aggravating Factors  turning head; notices more pain with in bed; sitting; lifting; using UE's    Pain Relieving Factors heat; meds              [facility] PT Assessment - 04/19/20 0001      Assessment   Medical Diagnosis Cervicalgia     Referring Provider (PT) Samuel Bouche, NP    Onset Date/Surgical Date 03/09/20    Hand Dominance Right    Next MD Visit to schedule post PT     Prior Therapy yes for LBP and cervical pain       Precautions   Precautions None      Restrictions   Weight Bearing Restrictions No      Balance Screen   Has the  patient fallen in the past 6 months No    Has the patient had a decrease in activity level because of a fear of falling?  No    Is the patient reluctant to leave their home because of a fear of falling?  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     Leisure household chores; riding horses and motorcycles; play with grandchildren       Observation/Other Assessments   Focus on Therapeutic Outcomes (FOTO)  54% limitation       Sensation   Additional Comments WFL's per pt report       Posture/Postural Control   Posture Comments head forward; shoulders rounded and elevated; scapulae abducted and rotated along the thoracic wall       AROM   Overall AROM Comments tight with all cervical movements     Right/Left Shoulder --    tight end ranges  Rt > Lt    Cervical Flexion 32    Cervical Extension 15    Cervical - Right Side Bend 15    Cervical - Left Side Bend 23    Cervical - Right Rotation 53    Cervical - Left Rotation 41      Strength   Overall Strength Comments functional - not tested resistively       Palpation   Spinal mobility hypomobility and pain with PA mobs mid thoracic to upper cervical spine     Palpation comment muscular tightness ant/lat/post cervical musculature; upper traps; periscapular musculature; pecs                          [facility] Adult PT Treatment/Exercise - 04/19/20 0001      Self-Care   Self-Care Other Self-Care Comments    Other Self-Care Comments  discussion of sitting posture and alignment       Neuro Re-ed    Neuro Re-ed Details  working on posture and alignment in standing       Shoulder Exercises: Standing   Other Standing Exercises chin tuck 10 sec x 5; scap squeeze 10 sec x 5; L's x 5; W's x 5 with noodle       Shoulder Exercises: Stretch   Other Shoulder Stretches doorway stretch 3 positions to pt's tolerance 30 sec x 3 reps                   PT Education - 04/19/20 1010    Education Details HEP POC    Person(s) Educated Patient    Methods Explanation;Demonstration;Tactile cues;Verbal cues;Handout    Comprehension Verbalized understanding;Returned demonstration;Verbal cues required;Tactile cues required            PT Short Term Goals - 04/19/20 1056      PT SHORT TERM GOAL #1   Title Pt to be independent with initial HEP    Time 4    Period Weeks    Status New    Target Date 05/17/20             PT Long Term Goals - 04/19/20 1056      PT LONG TERM GOAL #1   Title Increase AROM cervical extension and lateral flexion by 5-7 degrees with minimal to  no pain    Time 6    Period Weeks    Status New    Target Date 05/31/20      PT LONG TERM GOAL #2   Title Pt to report decreased pain in cervical region to 0-2/10 with work  duties and sleep.    Time 6    Period Weeks    Status New    Target Date 05/31/20      PT LONG TERM GOAL #3   Title Improve posture and alignment with demonstration of improved activation of posterior shoulder girdle musculature    Time 6    Status New    Target Date 05/31/20      PT LONG TERM GOAL #4   Title Independent in HEP    Time 6    Period Weeks    Status New    Target Date 05/31/20      PT LONG TERM GOAL #5   Title Improve FOTO to </= 39% limitation    Time 6    Period Weeks    Status New    Target Date 05/31/20                 Plan - 04/19/20 1040    Clinical Impression Statement Patient presents with recurrent cervical pain and dysfunction with recent flare up in the past month. She has poor posture and alignment; limited cervical mobility/ROM; significant muscular tightness through the upper body; poor movement patterns. Patient also has fibromyalgia making management of cervical dysfunction challenging. Patient will benefit from PT to address problems identified.    Stability/Clinical Decision Making Stable/Uncomplicated    Clinical Decision Making Low    Rehab Potential Good    PT Frequency 2x / week    PT Duration 6 weeks    PT Treatment/Interventions ADLs/Self Care Home Management;Aquatic Therapy;Cryotherapy;Electrical Stimulation;Iontophoresis 74m/ml Dexamethasone;Moist Heat;Ultrasound;Functional mobility training;Therapeutic activities;Therapeutic exercise;Balance training;Neuromuscular re-education;Patient/family education;Manual techniques;Passive range of motion;Dry needling;Taping    PT Next Visit Plan review HEP; progress with postural correction; manual work - NO TENS; NO DN    PT Home Exercise Plan [email or web address]           Patient will benefit from skilled therapeutic intervention in order to improve the following deficits and impairments:  Decreased range of motion, Increased fascial restricitons, Increased muscle spasms, Decreased activity  tolerance, Pain, Hypomobility, Impaired flexibility, Decreased strength, Postural dysfunction, Improper body mechanics  Visit Diagnosis: Cervicalgia - Plan: PT plan of care cert/re-cert  Abnormal posture - Plan: PT plan of care cert/re-cert  Other symptoms and signs involving the musculoskeletal system - Plan: PT plan of care cert/re-cert     Problem List Patient Active Problem List   Diagnosis Date Noted  . Morning joint stiffness 08/28/2019  . Constipation 08/28/2019  . Right shoulder pain 07/27/2019  . Glaucoma 07/21/2019  . Transaminitis 12/01/2017  . MDD (recurrent major depressive disorder) in remission ([place] 11/03/2017  . Insomnia 08/14/2017  . AK (actinic keratosis) 07/14/2017  . Low magnesium level 01/12/2017  . B12 deficiency 01/01/2017  . Trochanteric bursitis of left hip 10/08/2016  . Malabsorption of iron 05/09/2016  . History of gastric bypass 05/09/2016  . Arthritis of knee, degenerative 04/14/2016  . Surgical menopause 03/24/2016  . Family history of breast cancer 03/24/2016  . Left knee pain 10/01/2015  . Hypoglycemia 06/12/2015  . Iron deficiency anemia 05/02/2015  . Cubital tunnel syndrome on right 09/14/2014  . Degenerative disc disease, cervical 07/27/2014  . Fibromyalgia 04/11/2013  .  Essential hypertension, benign 04/11/2013  . Anxiety state 04/11/2013   PHYSICAL THERAPY DISCHARGE SUMMARY  Visits from Start of Care: 1  Current functional level related to goals / functional outcomes: Not assessed due to pt not returning   Remaining deficits: See above   Education / Equipment: HEP Plan: Patient agrees to discharge.  Patient goals were not met. Patient is being discharged due to not returning since the last visit.  ?????     DONAWERTH,KAREN, PT  Stacy Woods PT, MPH  04/19/2020, 12:47 PM  [facility] [place], [place], [address] Phone: [phone]   Fax:   [phone]  Name: Stacy Woods MRN: [id number] Date of Birth: [birth year]

## 2020-04-23 ENCOUNTER — Encounter: Payer: Self-pay | Admitting: Rehabilitative and Restorative Service Providers"

## 2020-04-23 ENCOUNTER — Encounter: Payer: Self-pay | Admitting: Medical-Surgical

## 2020-04-25 ENCOUNTER — Encounter: Payer: No Typology Code available for payment source | Admitting: Rehabilitative and Restorative Service Providers"

## 2020-04-27 ENCOUNTER — Encounter: Payer: No Typology Code available for payment source | Admitting: Rehabilitative and Restorative Service Providers"

## 2020-04-30 MED FILL — CYCLOBENZAPRINE HCL 10 MG T: 10 | 30 days supply | Qty: 90 | Fill #1

## 2020-04-30 MED FILL — ZOLPIDEM TARTRATE 5 MG TAB: 5 | 30 days supply | Qty: 30 | Fill #2

## 2020-05-01 ENCOUNTER — Encounter: Payer: No Typology Code available for payment source | Admitting: Rehabilitative and Restorative Service Providers"

## 2020-05-14 MED FILL — ESCITALOPRAM 10 MG TABLET: 10 | 90 days supply | Qty: 90 | Fill #3

## 2020-05-17 ENCOUNTER — Encounter (HOSPITAL_BASED_OUTPATIENT_CLINIC_OR_DEPARTMENT_OTHER): Payer: No Typology Code available for payment source | Admitting: Internal Medicine

## 2020-05-30 ENCOUNTER — Other Ambulatory Visit: Payer: Self-pay | Admitting: Family Medicine

## 2020-05-30 DIAGNOSIS — Z1231 Encounter for screening mammogram for malignant neoplasm of breast: Secondary | ICD-10-CM

## 2020-06-04 MED FILL — ZOLPIDEM TARTRATE 5 MG TAB: 5 | 30 days supply | Qty: 30 | Fill #3

## 2020-06-13 ENCOUNTER — Ambulatory Visit (INDEPENDENT_AMBULATORY_CARE_PROVIDER_SITE_OTHER): Payer: No Typology Code available for payment source

## 2020-06-13 ENCOUNTER — Other Ambulatory Visit: Payer: Self-pay

## 2020-06-13 DIAGNOSIS — Z1231 Encounter for screening mammogram for malignant neoplasm of breast: Secondary | ICD-10-CM | POA: Diagnosis not present

## 2020-06-20 ENCOUNTER — Encounter: Payer: Self-pay | Admitting: Medical-Surgical

## 2020-06-20 ENCOUNTER — Other Ambulatory Visit: Payer: Self-pay | Admitting: Medical-Surgical

## 2020-06-20 ENCOUNTER — Ambulatory Visit (INDEPENDENT_AMBULATORY_CARE_PROVIDER_SITE_OTHER): Payer: No Typology Code available for payment source

## 2020-06-20 ENCOUNTER — Ambulatory Visit (INDEPENDENT_AMBULATORY_CARE_PROVIDER_SITE_OTHER): Payer: No Typology Code available for payment source | Admitting: Medical-Surgical

## 2020-06-20 ENCOUNTER — Other Ambulatory Visit: Payer: Self-pay

## 2020-06-20 VITALS — BP 122/86 | HR 79 | Temp 98.9°F | Ht 64.0 in | Wt 221.7 lb

## 2020-06-20 DIAGNOSIS — M25562 Pain in left knee: Secondary | ICD-10-CM

## 2020-06-20 DIAGNOSIS — R601 Generalized edema: Secondary | ICD-10-CM | POA: Diagnosis not present

## 2020-06-20 DIAGNOSIS — I10 Essential (primary) hypertension: Secondary | ICD-10-CM

## 2020-06-20 DIAGNOSIS — E611 Iron deficiency: Secondary | ICD-10-CM

## 2020-06-20 DIAGNOSIS — G8929 Other chronic pain: Secondary | ICD-10-CM

## 2020-06-20 MED ORDER — CELECOXIB 100 MG PO CAPS: 100.0000 mg | ORAL_CAPSULE | Freq: Two times a day (BID) | ORAL | 0 refills | Status: DC

## 2020-06-20 MED ORDER — HYDROCHLOROTHIAZIDE 12.5 MG PO TABS: 12.5000 mg | ORAL_TABLET | Freq: Every day | ORAL | 0 refills | Status: DC

## 2020-06-20 MED ORDER — TRAMADOL HCL 50 MG PO TABS: 50.0000 mg | ORAL_TABLET | Freq: Two times a day (BID) | ORAL | 0 refills | Status: DC | PRN

## 2020-06-20 MED FILL — HYDROCHLOROTHIAZIDE 12.5 MG: 12.5 | 30 days supply | Qty: 30 | Fill #0

## 2020-06-20 MED FILL — CELECOXIB 100 MG CAP: 100 | 30 days supply | Qty: 60 | Fill #0

## 2020-06-20 MED FILL — traMADol HCL 50 MG TABS: 50 | 5 days supply | Qty: 10 | Fill #0

## 2020-06-20 NOTE — Progress Notes (Signed)
Subjective:    CC: left knee/hip pain  HPI: Pleasant 55 year old female presenting today with reports of left hip/leg/knee pain.  She does have a history of chronic left knee pain that has been previously treated with injections by Dr. Georgina Snell.  For the last 6 weeks, she has developed some acute pain in the left knee/hip.  She does not remember any specific injury but notes that for a while she was having to get in and out of the passenger side of a vehicle which aggravated the knee pain.  Wonders if the hip pain may be related to favoring her left knee.  She has been taking Tylenol and Advil, alternating.  She is not sure if they are actually helping or if it has a placebo effect.  Earlier in the day, her knee and hip do not hurt quite as bad but the pain intensity increases throughout the day until she reaches a 7-8/10.  HTN- she is currently taking lisinopril 10 mg daily.  Notes this works very well for management of her blood pressure and she is tolerating the medication without side effects.  Notes that she feels swollen all over.  Previously took HCTZ in combination with lisinopril and is interested in getting restarted on that to see if it helps with the swelling.  Iron deficiency-recently treated for iron deficiency and last labs did show an increase in her ferritin but it was still considered low.  I reviewed the past medical history, family history, social history, surgical history, and allergies today and no changes were needed.  Please see the problem list section below in epic for further details.  Past Medical History: Past Medical History:  Diagnosis Date  . AK (actinic keratosis) 07/14/2017   Right upper chest wall  . Anemia   . Anemia, iron deficiency 05/02/2015  . Fibromyalgia   . History of gastric bypass 05/09/2016  . Hypertension   . Insomnia 08/14/2017  . Malabsorption of iron 05/09/2016  . PONV (postoperative nausea and vomiting)   . Prediabetes 09/01/2013   March 2015   .  Seizure ([facility]) 2018   from low blood sugar   . Surgical menopause 03/24/2016   Past Surgical History: Past Surgical History:  Procedure Laterality Date  . ABDOMINAL HYSTERECTOMY  1997  . APPENDECTOMY    . CESAREAN SECTION  [id number]  . CHOLECYSTECTOMY    . GASTRIC BYPASS  2011  . KNEE ARTHROSCOPY  2002  . TONSILLECTOMY  1977  . ULNAR NERVE TRANSPOSITION Right 06/12/2015   Procedure: RIGHT CUBITAL TUNNEL RELEASE;  Surgeon: Milly Jakob, MD;  Location: [facility];  Service: Orthopedics;  Laterality: Right;   Social History: Social History   Socioeconomic History  . Marital status: Divorced    Spouse name: Not on file  . Number of children: 2  . Years of education: Assoc  . Highest education level: Not on file  Occupational History  . Occupation: [employer]  Tobacco Use  . Smoking status: Never Smoker  . Smokeless tobacco: Never Used  Vaping Use  . Vaping Use: Never used  Substance and Sexual Activity  . Alcohol use: No  . Drug use: No  . Sexual activity: Not Currently    Birth control/protection: Surgical    Comment: Hysterectomy  Other Topics Concern  . Not on file  Social History Narrative   Lives alone   Right-handed   Caffeine: <16 oz soda daily   Social Determinants of Health   Financial Resource Strain: Not on  file  Food Insecurity: Not on file  Transportation Needs: Not on file  Physical Activity: Not on file  Stress: Not on file  Social Connections: Not on file   Family History: Family History  Problem Relation Age of Onset  . Diabetes Father   . Hypertension Father   . Stroke Father   . Heart failure Father   . CAD Father        has stents  . Breast cancer Mother   . Migraines Sister   . Colon cancer Neg Hx   . Colon polyps Neg Hx   . Esophageal cancer Neg Hx   . Stomach cancer Neg Hx   . Rectal cancer Neg Hx    Allergies: Allergies  Allergen Reactions  . Mobic [Meloxicam] Swelling  . Naproxen Other (See Comments)     Swelling   . Nsaids Swelling    Swelling   . Prednisone Other (See Comments)    Swelling, blood sugar problems  . Shellfish Allergy Nausea Only and Swelling  . Lyrica [Pregabalin] Other (See Comments)    "zombie"  . Percocet [Oxycodone-Acetaminophen] Itching   Medications: See med rec.  Review of Systems: See HPI for pertinent positives and negatives.   Objective:    General: Well Developed, well nourished, and in no acute distress.  Neuro: Alert and oriented x3.  HEENT: Normocephalic, atraumatic.  Skin: Warm and dry. Cardiac: Regular rate and rhythm, no murmurs rubs or gallops, no lower extremity edema.  Respiratory: Clear to auscultation bilaterally. Not using accessory muscles, speaking in full sentences. Left knee: No redness or excessive warmth. Crepitus with flexion/extension. Tenderness to the medial and lateral lower knee. No limitation in ROM.   Impression and Recommendations:    1. Chronic pain of left knee Last imaging in 2018 so getting updated images today.  Trialing Celebrex 100 mg twice daily to see if she tolerates this well.  Recommend evaluation and further management by Dr. Darene Lamer.  Depending on x-ray results, may need MRI. - DG Knee Complete 4 Views Left; Future  2. Essential hypertension, benign Blood pressure at goal today.  Continue lisinopril 10 mg daily.  Adding HCTZ 12.5 mg daily.  Checking CBC and CMP. - CBC - COMPLETE METABOLIC PANEL WITH GFR  3. Generalized edema Checking labs and including TSH.  Has swelling listed a reaction to NSAIDs on her allergy list so this could possibly be related to Advil use.  Adding in HCTZ to see if this will help overall. - TSH  4. Iron deficiency Rechecking iron levels. - Fe+TIBC+Fer  Return for left knee pain to discuss injections with Dr. Darene Lamer at your convenience. ___________________________________________ Clearnce Sorrel, DNP, APRN, FNP-BC Primary Care and [place]

## 2020-06-21 ENCOUNTER — Encounter: Payer: Self-pay | Admitting: Medical-Surgical

## 2020-06-21 DIAGNOSIS — E611 Iron deficiency: Secondary | ICD-10-CM

## 2020-06-21 LAB — TSH: TSH: 2.97 mIU/L

## 2020-06-21 LAB — COMPLETE METABOLIC PANEL WITH GFR
AG Ratio: 1.8 (calc) (ref 1.0–2.5)
ALT: 22 U/L (ref 6–29)
AST: 23 U/L (ref 10–35)
Albumin: 4.4 g/dL (ref 3.6–5.1)
Alkaline phosphatase (APISO): 140 U/L (ref 37–153)
BUN: 9 mg/dL (ref 7–25)
CO2: 25 mmol/L (ref 20–32)
Calcium: 9.7 mg/dL (ref 8.6–10.4)
Chloride: 106 mmol/L (ref 98–110)
Creat: 0.97 mg/dL (ref 0.50–1.05)
GFR, Est African American: 78 mL/min/{1.73_m2} (ref >=60)
GFR, Est Non African American: 67 mL/min/{1.73_m2} (ref >=60)
Globulin: 2.4 g/dL (calc) (ref 1.9–3.7)
Glucose, Bld: 84 mg/dL (ref 65–99)
Potassium: 4.7 mmol/L (ref 3.5–5.3)
Sodium: 141 mmol/L (ref 135–146)
Total Bilirubin: 0.5 mg/dL (ref 0.2–1.2)
Total Protein: 6.8 g/dL (ref 6.1–8.1)

## 2020-06-21 LAB — IRON,TIBC AND FERRITIN PANEL
%SAT: 20 % (calc) (ref 16–45)
Ferritin: 9 ng/mL — ABNORMAL LOW (ref 16–232)
Iron: 85 ug/dL (ref 45–160)
TIBC: 425 mcg/dL (calc) (ref 250–450)

## 2020-06-21 LAB — CBC
HCT: 42.7 % (ref 35.0–45.0)
Hemoglobin: 14.2 g/dL (ref 11.7–15.5)
MCH: 30.1 pg (ref 27.0–33.0)
MCHC: 33.3 g/dL (ref 32.0–36.0)
MCV: 90.7 fL (ref 80.0–100.0)
MPV: 10.7 fL (ref 7.5–12.5)
Platelets: 421 10*3/uL — ABNORMAL HIGH (ref 140–400)
RBC: 4.71 10*6/uL (ref 3.80–5.10)
RDW: 13.3 % (ref 11.0–15.0)
WBC: 5.6 10*3/uL (ref 3.8–10.8)

## 2020-06-26 ENCOUNTER — Ambulatory Visit: Payer: No Typology Code available for payment source | Admitting: Sports Medicine

## 2020-07-02 ENCOUNTER — Telehealth: Payer: No Typology Code available for payment source | Admitting: Emergency Medicine

## 2020-07-02 DIAGNOSIS — R059 Cough, unspecified: Secondary | ICD-10-CM

## 2020-07-02 MED ORDER — BENZONATATE 100 MG PO CAPS: 100.0000 mg | ORAL_CAPSULE | Freq: Two times a day (BID) | ORAL | 0 refills | Status: DC | PRN

## 2020-07-02 MED FILL — LISINOPRIL 10 MG TABS: 10 | 90 days supply | Qty: 90 | Fill #1

## 2020-07-02 MED FILL — CYCLOBENZAPRINE HCL 10 MG T: 10 | 30 days supply | Qty: 90 | Fill #2

## 2020-07-02 MED FILL — ZOLPIDEM TARTRATE 5 MG TAB: 5 | 30 days supply | Qty: 30 | Fill #4

## 2020-07-02 NOTE — Progress Notes (Signed)
We are sorry that you are not feeling well.  Here is how we plan to help!  Based on your presentation I believe you most likely have A cough due to a virus.  This is called viral bronchitis and is best treated by rest, plenty of fluids and control of the cough.  You may use Ibuprofen or Tylenol as directed to help your symptoms.     In addition you may use A prescription cough medication called Tessalon Perles 100mg. You may take 1-2 capsules every 8 hours as needed for your cough.    From your responses in the eVisit questionnaire you describe inflammation in the upper respiratory tract which is causing a significant cough.  This is commonly called Bronchitis and has four common causes:    Allergies  Viral Infections  Acid Reflux  Bacterial Infection Allergies, viruses and acid reflux are treated by controlling symptoms or eliminating the cause. An example might be a cough caused by taking certain blood pressure medications. You stop the cough by changing the medication. Another example might be a cough caused by acid reflux. Controlling the reflux helps control the cough.  USE OF BRONCHODILATOR ("RESCUE") INHALERS: There is a risk from using your bronchodilator too frequently.  The risk is that over-reliance on a medication which only relaxes the muscles surrounding the breathing tubes can reduce the effectiveness of medications prescribed to reduce swelling and congestion of the tubes themselves.  Although you feel brief relief from the bronchodilator inhaler, your asthma may actually be worsening with the tubes becoming more swollen and filled with mucus.  This can delay other crucial treatments, such as oral steroid medications. If you need to use a bronchodilator inhaler daily, several times per day, you should discuss this with your provider.  There are probably better treatments that could be used to keep your asthma under control.     HOME CARE . Only take medications as instructed by  your medical team. . Complete the entire course of an antibiotic. . Drink plenty of fluids and get plenty of rest. . Avoid close contacts especially the very young and the elderly . Cover your mouth if you cough or cough into your sleeve. . Always remember to wash your hands . A steam or ultrasonic humidifier can help congestion.   GET HELP RIGHT AWAY IF: . You develop worsening fever. . You become short of breath . You cough up blood. . Your symptoms persist after you have completed your treatment plan MAKE SURE YOU   Understand these instructions.  Will watch your condition.  Will get help right away if you are not doing well or get worse.  Your e-visit answers were reviewed by a board certified advanced clinical practitioner to complete your personal care plan.  Depending on the condition, your plan could have included both over the counter or prescription medications. If there is a problem please reply  once you have received a response from your provider. Your safety is important to us.  If you have drug allergies check your prescription carefully.    You can use MyChart to ask questions about today's visit, request a non-urgent call back, or ask for a work or school excuse for 24 hours related to this e-Visit. If it has been greater than 24 hours you will need to follow up with your provider, or enter a new e-Visit to address those concerns. You will get an e-mail in the next two days asking about your experience.    I hope that your e-visit has been valuable and will speed your recovery. Thank you for using e-visits.  Approximately 5 minutes was used in reviewing the patient's chart, questionnaire, prescribing medications, and documentation.  

## 2020-07-02 NOTE — Addendum Note (Signed)
Addended by: Montine Circle B on: 07/02/2020 12:00 PM   Modules accepted: Orders

## 2020-07-03 ENCOUNTER — Other Ambulatory Visit: Payer: Self-pay | Admitting: Sports Medicine

## 2020-07-03 ENCOUNTER — Ambulatory Visit (INDEPENDENT_AMBULATORY_CARE_PROVIDER_SITE_OTHER): Payer: No Typology Code available for payment source

## 2020-07-03 ENCOUNTER — Ambulatory Visit (INDEPENDENT_AMBULATORY_CARE_PROVIDER_SITE_OTHER): Payer: No Typology Code available for payment source | Admitting: Sports Medicine

## 2020-07-03 ENCOUNTER — Telehealth: Payer: Self-pay | Admitting: Sports Medicine

## 2020-07-03 ENCOUNTER — Other Ambulatory Visit: Payer: Self-pay

## 2020-07-03 DIAGNOSIS — M170 Bilateral primary osteoarthritis of knee: Secondary | ICD-10-CM | POA: Diagnosis not present

## 2020-07-03 DIAGNOSIS — M1711 Unilateral primary osteoarthritis, right knee: Secondary | ICD-10-CM

## 2020-07-03 MED ORDER — DICLOFENAC SODIUM 75 MG PO TBEC: 75.0000 mg | DELAYED_RELEASE_TABLET | Freq: Two times a day (BID) | ORAL | 3 refills | Status: DC

## 2020-07-03 MED FILL — DICLOFENAC SODIUM 75 MG TAB: 75 | 30 days supply | Qty: 60 | Fill #0

## 2020-07-03 NOTE — Telephone Encounter (Signed)
Please work on approval for Orthovisc or any other viscosupplementation has failed conservative treatment, x-ray confirmed, good results from Orthovisc in the past.

## 2020-07-03 NOTE — Progress Notes (Signed)
    Procedures performed today:    None.  Independent interpretation of notes and tests performed by another provider:   None.  Brief History, Exam, Impression, and Recommendations:    Primary osteoarthritis of both knees Stacy Woods is a pleasant 55 year old female, she has known bilateral knee osteoarthritis and responded well to Orthovisc with Dr. Georgina Snell about 3 years ago, we are going to add updated x-rays of her right knee, left knee was recently done and does confirm osteoarthritis, declined steroid injection today, switching from Celebrex to Voltaren, she feels like she is getting some hoarseness from the Celebrex and is unable to do meloxicam. Also going to work on getting her approved for viscosupplementation, she can return to see me to do the injections if/when approved.    ___________________________________________ Gwen Her. Dianah Field, M.D., ABFM., CAQSM. Primary Care and [place] Instructor of [employer] [facility] of Medicine

## 2020-07-03 NOTE — Assessment & Plan Note (Signed)
Stacy Woods is a pleasant 56 year old female, she has known bilateral knee osteoarthritis and responded well to Orthovisc with Dr. Georgina Snell about 3 years ago, we are going to add updated x-rays of her right knee, left knee was recently done and does confirm osteoarthritis, declined steroid injection today, switching from Celebrex to Voltaren, she feels like she is getting some hoarseness from the Celebrex and is unable to do meloxicam. Also going to work on getting her approved for viscosupplementation, she can return to see me to do the injections if/when approved.

## 2020-07-06 NOTE — Telephone Encounter (Signed)
Submitted orthovisc waiting on BID and to see if prior authorization is required. - CF

## 2020-07-13 MED ORDER — ORTHOVISC 30 MG/2ML IX SOSY: PREFILLED_SYRINGE | INTRA_ARTICULAR | 1 refills | Status: DC

## 2020-07-13 NOTE — Telephone Encounter (Signed)
Prior auth required I called [facility] they are requesting clinicals and an order.  Dr. Darene Lamer   Please write script with order for the Orthovisc so I can fax to [facility] to get her Orthovisc approved!  Thank you cindy

## 2020-07-13 NOTE — Addendum Note (Signed)
Addended by: Silverio Decamp on: 07/13/2020 04:30 PM   Modules accepted: Orders

## 2020-07-17 NOTE — Telephone Encounter (Signed)
I called Stacy Woods to let her know her injections have been approved. I gave her the case number so she could call about cost we also went ahead and scheduled her for her injection. - CF

## 2020-07-18 ENCOUNTER — Encounter: Payer: Self-pay | Admitting: Medical-Surgical

## 2020-07-20 ENCOUNTER — Encounter: Payer: Self-pay | Admitting: Medical-Surgical

## 2020-07-20 ENCOUNTER — Other Ambulatory Visit: Payer: Self-pay | Admitting: Medical-Surgical

## 2020-07-20 ENCOUNTER — Telehealth (INDEPENDENT_AMBULATORY_CARE_PROVIDER_SITE_OTHER): Payer: No Typology Code available for payment source | Admitting: Medical-Surgical

## 2020-07-20 VITALS — Temp 97.0°F

## 2020-07-20 DIAGNOSIS — U071 COVID-19: Secondary | ICD-10-CM

## 2020-07-20 DIAGNOSIS — I10 Essential (primary) hypertension: Secondary | ICD-10-CM

## 2020-07-20 MED ORDER — HYDROCODONE-HOMATROPINE 5-1.5 MG/5ML PO SYRP: 5.0000 mL | ORAL_SOLUTION | Freq: Four times a day (QID) | ORAL | 0 refills | Status: DC | PRN

## 2020-07-20 MED ORDER — LISINOPRIL-HYDROCHLOROTHIAZIDE 10-12.5 MG PO TABS: 1.0000 | ORAL_TABLET | Freq: Every day | ORAL | 1 refills | Status: DC

## 2020-07-20 MED FILL — LISINOPRIL-HCTZ 10-12.5 MG: 10-12.5 | 90 days supply | Qty: 90 | Fill #0

## 2020-07-20 NOTE — Progress Notes (Signed)
Virtual Visit via Video Note  I connected with Stacy Woods on 07/20/20 at 10:10 AM EST by a video enabled telemedicine application and verified that I am speaking with the correct person using two identifiers.   I discussed the limitations of evaluation and management by telemedicine and the availability of in person appointments. The patient expressed understanding and agreed to proceed.  Patient location: home Provider locations: office  Subjective:    CC: COVID +  HPI: Pleasant 55 year old female presenting via MyChart video visit with reports of upper respiratory symptoms that started on Monday. She originally had a fever but has not been febrile since Tuesday. She is having significant body aches, PND, nonproductive cough, and fatigue. She did have mild midline chest pain for the first 1 to 2 days but this has resolved. She tested positive for Covid and has been managing her symptoms with over-the-counter remedies. She has tried Robitussin, Mucinex, Delsym, and Vicks with no relief of symptoms. Her mom had a prescription for Hydromet cough syrup and she has used that for the past 2 nights to be able to get some sleep. Denies shortness of breath and GI symptoms.  Has been taking lisinopril and HCTZ, tolerating both well. Would like to have this prescribed and a combination pill if possible.  Past medical history, Surgical history, Family history not pertinant except as noted below, Social history, Allergies, and medications have been entered into the medical record, reviewed, and corrections made.   Review of Systems: See HPI for pertinent positives and negatives.   Objective:    General: Speaking clearly in complete sentences without any shortness of breath.  Alert and oriented x3.  Normal judgment. No apparent acute distress.  Impression and Recommendations:    1. COVID-19 Continue supportive care and use of over-the-counter remedies if desired. Sending in Hycodan cough syrup 5  mL every 6 hours as needed for cough. Increase p.o. fluids and rest. Continue Flonase for sinus congestion.  2. Essential hypertension, benign Continue lisinopril and HCTZ. Combination pill is available so this has been sent to her pharmacy.  I discussed the assessment and treatment plan with the patient. The patient was provided an opportunity to ask questions and all were answered. The patient agreed with the plan and demonstrated an understanding of the instructions.   The patient was advised to call back or seek an in-person evaluation if the symptoms worsen or if the condition fails to improve as anticipated.  20 minutes of non-face-to-face time was provided during this encounter.  Return if symptoms worsen or fail to improve.  Clearnce Sorrel, DNP, APRN, FNP-BC [facility]

## 2020-07-23 ENCOUNTER — Other Ambulatory Visit: Payer: Self-pay | Admitting: Family

## 2020-07-23 DIAGNOSIS — D649 Anemia, unspecified: Secondary | ICD-10-CM

## 2020-07-24 ENCOUNTER — Inpatient Hospital Stay: Payer: No Typology Code available for payment source

## 2020-07-24 ENCOUNTER — Encounter: Payer: Self-pay | Admitting: Family

## 2020-07-24 ENCOUNTER — Inpatient Hospital Stay: Payer: No Typology Code available for payment source | Attending: Family | Admitting: Family

## 2020-07-24 ENCOUNTER — Other Ambulatory Visit: Payer: Self-pay

## 2020-07-24 VITALS — BP 128/84 | HR 78 | Resp 17 | Ht 64.0 in | Wt 219.1 lb

## 2020-07-24 DIAGNOSIS — Z833 Family history of diabetes mellitus: Secondary | ICD-10-CM | POA: Insufficient documentation

## 2020-07-24 DIAGNOSIS — E611 Iron deficiency: Secondary | ICD-10-CM

## 2020-07-24 DIAGNOSIS — Z90710 Acquired absence of both cervix and uterus: Secondary | ICD-10-CM | POA: Insufficient documentation

## 2020-07-24 DIAGNOSIS — D649 Anemia, unspecified: Secondary | ICD-10-CM

## 2020-07-24 DIAGNOSIS — Z8249 Family history of ischemic heart disease and other diseases of the circulatory system: Secondary | ICD-10-CM | POA: Insufficient documentation

## 2020-07-24 DIAGNOSIS — Z79899 Other long term (current) drug therapy: Secondary | ICD-10-CM | POA: Insufficient documentation

## 2020-07-24 DIAGNOSIS — K909 Intestinal malabsorption, unspecified: Secondary | ICD-10-CM | POA: Insufficient documentation

## 2020-07-24 DIAGNOSIS — Z9884 Bariatric surgery status: Secondary | ICD-10-CM | POA: Diagnosis not present

## 2020-07-24 DIAGNOSIS — D508 Other iron deficiency anemias: Secondary | ICD-10-CM | POA: Diagnosis not present

## 2020-07-24 DIAGNOSIS — Z803 Family history of malignant neoplasm of breast: Secondary | ICD-10-CM | POA: Insufficient documentation

## 2020-07-24 DIAGNOSIS — I10 Essential (primary) hypertension: Secondary | ICD-10-CM | POA: Insufficient documentation

## 2020-07-24 DIAGNOSIS — G4700 Insomnia, unspecified: Secondary | ICD-10-CM | POA: Insufficient documentation

## 2020-07-24 LAB — CBC WITH DIFFERENTIAL (CANCER CENTER ONLY)
Abs Immature Granulocytes: 0.04 10*3/uL (ref 0.00–0.07)
Basophils Absolute: 0.1 10*3/uL (ref 0.0–0.1)
Basophils Relative: 1 %
Eosinophils Absolute: 0.1 10*3/uL (ref 0.0–0.5)
Eosinophils Relative: 2 %
HCT: 43.3 % (ref 36.0–46.0)
Hemoglobin: 14.7 g/dL (ref 12.0–15.0)
Immature Granulocytes: 1 %
Lymphocytes Relative: 39 %
Lymphs Abs: 2.5 10*3/uL (ref 0.7–4.0)
MCH: 29.6 pg (ref 26.0–34.0)
MCHC: 33.9 g/dL (ref 30.0–36.0)
MCV: 87.3 fL (ref 80.0–100.0)
Monocytes Absolute: 0.6 10*3/uL (ref 0.1–1.0)
Monocytes Relative: 9 %
Neutro Abs: 3.2 10*3/uL (ref 1.7–7.7)
Neutrophils Relative %: 48 %
Platelet Count: 467 10*3/uL — ABNORMAL HIGH (ref 150–400)
RBC: 4.96 MIL/uL (ref 3.87–5.11)
RDW: 13.1 % (ref 11.5–15.5)
WBC Count: 6.4 10*3/uL (ref 4.0–10.5)
nRBC: 0 % (ref 0.0–0.2)

## 2020-07-24 LAB — SAVE SMEAR(SSMR), FOR PROVIDER SLIDE REVIEW

## 2020-07-24 LAB — CMP (CANCER CENTER ONLY)
ALT: 23 U/L (ref 0–44)
AST: 26 U/L (ref 15–41)
Albumin: 4.7 g/dL (ref 3.5–5.0)
Alkaline Phosphatase: 105 U/L (ref 38–126)
Anion gap: 10 (ref 5–15)
BUN: 13 mg/dL (ref 6–20)
CO2: 28 mmol/L (ref 22–32)
Calcium: 10.1 mg/dL (ref 8.9–10.3)
Chloride: 99 mmol/L (ref 98–111)
Creatinine: 1.01 mg/dL — ABNORMAL HIGH (ref 0.44–1.00)
GFR, Estimated: >60 mL/min (ref >60)
Glucose, Bld: 105 mg/dL — ABNORMAL HIGH (ref 70–99)
Potassium: 3.6 mmol/L (ref 3.5–5.1)
Sodium: 137 mmol/L (ref 135–145)
Total Bilirubin: 0.7 mg/dL (ref 0.3–1.2)
Total Protein: 7.2 g/dL (ref 6.5–8.1)

## 2020-07-24 LAB — RETICULOCYTES
Immature Retic Fract: 11.4 % (ref 2.3–15.9)
RBC.: 4.95 MIL/uL (ref 3.87–5.11)
Retic Count, Absolute: 54 10*3/uL (ref 19.0–186.0)
Retic Ct Pct: 1.1 % (ref 0.4–3.1)

## 2020-07-24 LAB — LACTATE DEHYDROGENASE: LDH: 167 U/L (ref 98–192)

## 2020-07-24 NOTE — Progress Notes (Signed)
Hematology/Oncology Consultation   Name: Stacy Woods      MRN: [id number]    Location: Room/bed info not found  Date: 07/24/2020 Time:11:06 AM   REFERRING PHYSICIAN: Samuel Bouche, NP  REASON FOR CONSULT: Iron deficiency    DIAGNOSIS: Iron deficiency secondary to malabsorption - gastric bypass 2011  HISTORY OF PRESENT ILLNESS: Stacy Woods is a very pleasant 55 yo caucasian female with history of symptomatic iron deficiency.  She has received IV iron in the past. Her last dose was in December 2017.  She is currently on an oral iron supplement and has had no improvement in her counts (iron saturation 20% and ferritin 9) and is experiencing worsened constipation and nausea.  She has not noted any blood loss. No abnormal bruising, no petechiae.  She is symptomatic with fatigue, dizziness, SOB with exertion, occasional palpitations, ice and salt cravings and numbness and tingling in her hands intermittently.  No family history of anemia.  She had her colonoscopy on 07/2018 and it was normal. She is due again in 2030.  She had a hysterectomy in 2334 due to complications stemming from endometriosis. She has has had multiple surgeries in the past and states that there have been no complications.  She has 2 children and no history of miscarriage.  No history of thyroid disease.  She has hypoglycemia and carries glucose tablets and also has the injectable glucagon. She states that she had a seizure once and was found to have a blood glucose in single digits requiring hospitalization. She avoids carbs which she notes helps regulate her glucose.  No fever, chills, vomiting, cough, rash, chest pain, abdominal pain or changes in bladder habits.  She occasionally has puffiness in her hands which she feels may be due to arthritis.  She states that she typically stays active but the symptoms with anemia have kept her from being able to get out.  No smoking or recreational drug use. She rarely has an alcoholic  beverage socially.  She has maintained a good appetite and is staying well hydrated. Her weight is stable.   ROS: All other 10 point review of systems is negative.   PAST MEDICAL HISTORY:   Past Medical History:  Diagnosis Date  . AK (actinic keratosis) 07/14/2017   Right upper chest wall  . Anemia   . Anemia, iron deficiency 05/02/2015  . Fibromyalgia   . History of gastric bypass 05/09/2016  . Hypertension   . Insomnia 08/14/2017  . Malabsorption of iron 05/09/2016  . PONV (postoperative nausea and vomiting)   . Prediabetes 09/01/2013   March 2015   . Seizure ([place]) 2018   from low blood sugar   . Surgical menopause 03/24/2016    ALLERGIES: Allergies  Allergen Reactions  . Mobic [Meloxicam] Swelling  . Naproxen Other (See Comments)    Swelling   . Nsaids Swelling    Swelling   . Prednisone Other (See Comments)    Swelling, blood sugar problems  . Shellfish Allergy Nausea Only and Swelling  . Lyrica [Pregabalin] Other (See Comments)    "zombie"  . Percocet [Oxycodone-Acetaminophen] Itching      MEDICATIONS:  Current Outpatient Medications on File Prior to Visit  Medication Sig Dispense Refill  . blood glucose meter kit and supplies Dispense based on patient and insurance preference. Use up to four times daily as directed. (FOR ICD-10 E10.9, E11.9). 1 each 3  . cyclobenzaprine (FLEXERIL) 10 MG tablet Take 1 tablet (10 mg total) by mouth 3 (three)  times daily as needed for muscle spasms. 90 tablet 3  . diclofenac (VOLTAREN) 75 MG EC tablet Take 1 tablet (75 mg total) by mouth 2 (two) times daily. 60 tablet 3  . escitalopram (LEXAPRO) 10 MG tablet TAKE 1 TABLET (10 MG TOTAL) BY MOUTH DAILY. 90 tablet 3  . glucagon 1 MG injection Inject 1 mg into the muscle once as needed for up to 1 dose. For low blood sugar 1 each 12  . Hyaluronan (ORTHOVISC) 30 MG/2ML SOSY 1 syringe injected into each knee weekly x4.  Orthovisc DAW.  Please dispense #8 syringes. 16 mL 1  .  HYDROcodone-homatropine (HYCODAN) 5-1.5 MG/5ML syrup Take 5 mLs by mouth every 6 (six) hours as needed for cough. 120 mL 0  . Lancets 30G MISC Check fasting blood sugar daily 50 each PRN  . lisinopril-hydrochlorothiazide (ZESTORETIC) 10-12.5 MG tablet Take 1 tablet by mouth daily. 90 tablet 1  . traMADol (ULTRAM) 50 MG tablet Take 1 tablet (50 mg total) by mouth 2 (two) times daily as needed for moderate pain. 10 tablet 0  . zolpidem (AMBIEN) 5 MG tablet TAKE 1 TABLET BY MOUTH EVERY NIGHT AT BEDTIME AS NEEDED FOR SLEEP 30 tablet 5   No current facility-administered medications on file prior to visit.     PAST SURGICAL HISTORY Past Surgical History:  Procedure Laterality Date  . ABDOMINAL HYSTERECTOMY  1997  . APPENDECTOMY    . CESAREAN SECTION  [id number]  . CHOLECYSTECTOMY    . GASTRIC BYPASS  2011  . KNEE ARTHROSCOPY  2002  . TONSILLECTOMY  1977  . ULNAR NERVE TRANSPOSITION Right 06/12/2015   Procedure: RIGHT CUBITAL TUNNEL RELEASE;  Surgeon: Milly Jakob, MD;  Location: [facility];  Service: Orthopedics;  Laterality: Right;    FAMILY HISTORY: Family History  Problem Relation Age of Onset  . Diabetes Father   . Hypertension Father   . Stroke Father   . Heart failure Father   . CAD Father        has stents  . Breast cancer Mother   . Migraines Sister   . Colon cancer Neg Hx   . Colon polyps Neg Hx   . Esophageal cancer Neg Hx   . Stomach cancer Neg Hx   . Rectal cancer Neg Hx     SOCIAL HISTORY:  reports that she has never smoked. She has never used smokeless tobacco. She reports that she does not drink alcohol and does not use drugs.  PERFORMANCE STATUS: The patient's performance status is 1 - Symptomatic but completely ambulatory  PHYSICAL EXAM: Most Recent Vital Signs: Blood pressure 128/84, pulse 78, resp. rate 17, height 5' 4"  (1.626 m), weight 219 lb 1.9 oz (99.4 kg), SpO2 100 %. BP 128/84 (BP Location: Right Arm, Patient Position: Sitting)    Pulse 78   Resp 17   Ht 5' 4"  (1.626 m)   Wt 219 lb 1.9 oz (99.4 kg)   SpO2 100%   BMI 37.61 kg/m   General Appearance:    Alert, cooperative, no distress, appears stated age  Head:    Normocephalic, without obvious abnormality, atraumatic  Eyes:    PERRL, conjunctiva/corneas clear, EOM's intact, fundi    benign, both eyes        Throat:   Lips, mucosa, and tongue normal; teeth and gums normal  Neck:   Supple, symmetrical, trachea midline, no adenopathy;    thyroid:  no enlargement/tenderness/nodules; no carotid   bruit or JVD  Back:  Symmetric, no curvature, ROM normal, no CVA tenderness  Lungs:     Clear to auscultation bilaterally, respirations unlabored  Chest Wall:    No tenderness or deformity   Heart:    Regular rate and rhythm, S1 and S2 normal, no murmur, rub   or gallop     Abdomen:     Soft, non-tender, bowel sounds active all four quadrants,    no masses, no organomegaly        Extremities:   Extremities normal, atraumatic, no cyanosis or edema  Pulses:   2+ and symmetric all extremities  Skin:   Skin color, texture, turgor normal, no rashes or lesions  Lymph nodes:   Cervical, supraclavicular, and axillary nodes normal  Neurologic:   CNII-XII intact, normal strength, sensation and reflexes    throughout    LABORATORY DATA:  Results for orders placed or performed in visit on 07/24/20 (from the past 48 hour(s))  CBC with Differential (Cancer Center Only)     Status: Abnormal   Collection Time: 07/24/20 10:51 AM  Result Value Ref Range   WBC Count 6.4 4.0 - 10.5 K/uL   RBC 4.96 3.87 - 5.11 MIL/uL   Hemoglobin 14.7 12.0 - 15.0 g/dL   HCT 43.3 36.0 - 46.0 %   MCV 87.3 80.0 - 100.0 fL   MCH 29.6 26.0 - 34.0 pg   MCHC 33.9 30.0 - 36.0 g/dL   RDW 13.1 11.5 - 15.5 %   Platelet Count 467 (H) 150 - 400 K/uL   nRBC 0.0 0.0 - 0.2 %   Neutrophils Relative % 48 %   Neutro Abs 3.2 1.7 - 7.7 K/uL   Lymphocytes Relative 39 %   Lymphs Abs 2.5 0.7 - 4.0 K/uL    Monocytes Relative 9 %   Monocytes Absolute 0.6 0.1 - 1.0 K/uL   Eosinophils Relative 2 %   Eosinophils Absolute 0.1 0.0 - 0.5 K/uL   Basophils Relative 1 %   Basophils Absolute 0.1 0.0 - 0.1 K/uL   Immature Granulocytes 1 %   Abs Immature Granulocytes 0.04 0.00 - 0.07 K/uL    Comment: Performed at [facility] at [facility], [address], [place], [place] [address]  Save Smear [facility])     Status: None   Collection Time: 07/24/20 10:51 AM  Result Value Ref Range   Smear Review SMEAR STAINED AND AVAILABLE FOR REVIEW     Comment: Performed at [facility] at [facility], [address], [place], [place] [address]  Reticulocytes     Status: None   Collection Time: 07/24/20 10:52 AM  Result Value Ref Range   Retic Ct Pct 1.1 0.4 - 3.1 %   RBC. 4.95 3.87 - 5.11 MIL/uL   Retic Count, Absolute 54.0 19.0 - 186.0 K/uL   Immature Retic Fract 11.4 2.3 - 15.9 %    Comment: Performed at [facility] at [facility], [address] [place], [place] [address]      RADIOGRAPHY: No results found.     PATHOLOGY: None  ASSESSMENT/PLAN: Stacy Woods is a very pleasant 55 yo caucasian female with history of symptomatic iron deficiency secondary to malabsorption s/p gastric bypass in June 2011.  Iron studies are pending. We will get her set up for IV iron if needed.  Follow-up in another 8 weeks.   All questions were answered and she is in agreement with the plan.  She can contact our office with any questions or concerns. We can certainly see her sooner if needed.    Laverna Peace, NP     Addendum: Iron saturation low. Insurance approved Venofer so we will proceed with 3 infusions.

## 2020-07-25 ENCOUNTER — Telehealth: Payer: Self-pay | Admitting: Family

## 2020-07-25 LAB — IRON AND TIBC
Iron: 87 ug/dL (ref 41–142)
Saturation Ratios: 18 % — ABNORMAL LOW (ref 21–57)
TIBC: 473 ug/dL — ABNORMAL HIGH (ref 236–444)
UIBC: 386 ug/dL — ABNORMAL HIGH (ref 120–384)

## 2020-07-25 LAB — ERYTHROPOIETIN: Erythropoietin: 13.7 m[IU]/mL (ref 2.6–18.5)

## 2020-07-25 LAB — FERRITIN: Ferritin: 13 ng/mL (ref 11–307)

## 2020-07-25 NOTE — Telephone Encounter (Signed)
I called and LMVM for patient regarding appointments scheduled per 2/16 sch msg

## 2020-07-30 ENCOUNTER — Inpatient Hospital Stay: Payer: No Typology Code available for payment source

## 2020-07-30 ENCOUNTER — Other Ambulatory Visit: Payer: Self-pay

## 2020-07-30 VITALS — BP 104/75 | HR 89 | Temp 99.1°F | Resp 19

## 2020-07-30 DIAGNOSIS — D508 Other iron deficiency anemias: Secondary | ICD-10-CM

## 2020-07-30 MED ORDER — SODIUM CHLORIDE 0.9 % IV SOLN
Freq: Once | INTRAVENOUS | Status: AC
  Filled 2020-07-30: qty 250

## 2020-07-30 MED ORDER — SODIUM CHLORIDE 0.9 % IV SOLN
200.0000 mg | Freq: Once | INTRAVENOUS | Status: AC
  Administered 2020-07-30: 200 mg via INTRAVENOUS
  Filled 2020-07-30: qty 200

## 2020-07-30 NOTE — Patient Instructions (Signed)

## 2020-08-01 ENCOUNTER — Other Ambulatory Visit: Payer: Self-pay | Admitting: Family Medicine

## 2020-08-01 DIAGNOSIS — F411 Generalized anxiety disorder: Secondary | ICD-10-CM

## 2020-08-01 MED FILL — ZOLPIDEM TARTRATE 5 MG TAB: 5 | 30 days supply | Qty: 30 | Fill #5

## 2020-08-02 ENCOUNTER — Other Ambulatory Visit: Payer: Self-pay | Admitting: Family Medicine

## 2020-08-02 MED FILL — ESCITALOPRAM 10 MG TABLET: 10 | 90 days supply | Qty: 90 | Fill #0

## 2020-08-03 ENCOUNTER — Other Ambulatory Visit: Payer: Self-pay

## 2020-08-03 ENCOUNTER — Ambulatory Visit (INDEPENDENT_AMBULATORY_CARE_PROVIDER_SITE_OTHER): Payer: No Typology Code available for payment source | Admitting: Sports Medicine

## 2020-08-03 ENCOUNTER — Ambulatory Visit (INDEPENDENT_AMBULATORY_CARE_PROVIDER_SITE_OTHER): Payer: No Typology Code available for payment source

## 2020-08-03 ENCOUNTER — Inpatient Hospital Stay: Payer: No Typology Code available for payment source

## 2020-08-03 VITALS — BP 99/69 | HR 96 | Temp 98.9°F | Resp 16

## 2020-08-03 DIAGNOSIS — M170 Bilateral primary osteoarthritis of knee: Secondary | ICD-10-CM

## 2020-08-03 DIAGNOSIS — D508 Other iron deficiency anemias: Secondary | ICD-10-CM | POA: Diagnosis not present

## 2020-08-03 MED ORDER — SODIUM CHLORIDE 0.9 % IV SOLN
Freq: Once | INTRAVENOUS | Status: AC
  Filled 2020-08-03: qty 250

## 2020-08-03 MED ORDER — SODIUM CHLORIDE 0.9 % IV SOLN
200.0000 mg | Freq: Once | INTRAVENOUS | Status: AC
  Administered 2020-08-03: 200 mg via INTRAVENOUS
  Filled 2020-08-03: qty 200

## 2020-08-03 NOTE — Patient Instructions (Signed)

## 2020-08-03 NOTE — Assessment & Plan Note (Signed)
This is a very pleasant 55 year old female with bilateral knee osteoarthritis, she did well with Orthovisc about 3 years ago, restarting Orthovisc series #1 of for both knees, return to see me in 1 week for shot #2 of for both knees, we did switch to Voltaren as well.

## 2020-08-03 NOTE — Progress Notes (Signed)
    Procedures performed today:    Procedure: Real-time Ultrasound Guided injection of the left knee Device: Samsung HS60  Verbal informed consent obtained.  Time-out conducted.  Noted no overlying erythema, induration, or other signs of local infection.  Skin prepped in a sterile fashion.  Local anesthesia: Topical Ethyl chloride.  With sterile technique and under real time ultrasound guidance:  No effusion noted, 30 mg/2 mL of OrthoVisc (sodium hyaluronate) in a prefilled syringe was injected easily into the knee through a 22-gauge needle. Completed without difficulty  Advised to call if fevers/chills, erythema, induration, drainage, or persistent bleeding.  Images permanently stored and available for review in PACS.  Impression: Technically successful ultrasound guided injection.  Procedure: Real-time Ultrasound Guided injection of the right knee Device: Samsung HS60  Verbal informed consent obtained.  Time-out conducted.  Noted no overlying erythema, induration, or other signs of local infection.  Skin prepped in a sterile fashion.  Local anesthesia: Topical Ethyl chloride.  With sterile technique and under real time ultrasound guidance:  Noted minor effusion, 30 mg/2 mL of OrthoVisc (sodium hyaluronate) in a prefilled syringe was injected easily into the knee through a 22-gauge needle. Completed without difficulty  Advised to call if fevers/chills, erythema, induration, drainage, or persistent bleeding.  Images permanently stored and available for review in PACS.  Impression: Technically successful ultrasound guided injection.  Independent interpretation of notes and tests performed by another provider:   None.  Brief History, Exam, Impression, and Recommendations:    Primary osteoarthritis of both knees This is a very pleasant 55 year old female with bilateral knee osteoarthritis, she did well with Orthovisc about 3 years ago, restarting Orthovisc series #1 of for both  knees, return to see me in 1 week for shot #2 of for both knees, we did switch to Voltaren as well.    ___________________________________________ Gwen Her. Dianah Field, M.D., ABFM., CAQSM. Primary Care and [place] Instructor of [place] [employer] [facility] [employer]

## 2020-08-04 ENCOUNTER — Encounter: Payer: Self-pay | Admitting: Emergency Medicine

## 2020-08-04 ENCOUNTER — Emergency Department
Admission: EM | Admit: 2020-08-04 | Discharge: 2020-08-04 | Disposition: A | Discharge status: Home / Self Care | Payer: No Typology Code available for payment source | Source: Home / Self Care | Attending: Family Medicine | Admitting: Family Medicine

## 2020-08-04 ENCOUNTER — Telehealth: Payer: No Typology Code available for payment source | Admitting: Physician Assistant

## 2020-08-04 ENCOUNTER — Encounter: Payer: Self-pay | Admitting: Physician Assistant

## 2020-08-04 DIAGNOSIS — M25562 Pain in left knee: Secondary | ICD-10-CM | POA: Diagnosis not present

## 2020-08-04 DIAGNOSIS — M659 Synovitis and tenosynovitis, unspecified: Secondary | ICD-10-CM

## 2020-08-04 MED ORDER — KETOROLAC TROMETHAMINE 30 MG/ML IJ SOLN
30.0000 mg | Freq: Once | INTRAMUSCULAR | Status: AC
  Administered 2020-08-04: 30 mg via INTRAMUSCULAR

## 2020-08-04 MED ORDER — HYDROCODONE-ACETAMINOPHEN 7.5-325 MG PO TABS: 1.0000 | ORAL_TABLET | Freq: Four times a day (QID) | ORAL | 0 refills | Status: DC | PRN

## 2020-08-04 NOTE — Patient Instructions (Signed)
I encourage you to follow-up with the orthopedic office or present to Ortho urgent care or the emergency department for prompt evaluation of your knee pain.  I hope that you feel better soon  Kennieth Rad, PA-C Physician Assistant [place] [email or web address]   Sodium Hyaluronate intra-articular injection What is this medicine? SODIUM HYALURONATE (SOE dee um hye al yoor ON ate) is used to treat pain in the knee due to osteoarthritis. This medicine may be used for other purposes; ask your health care provider or pharmacist if you have questions. COMMON BRAND NAME(S): Amvisc, DUROLANE, Euflexxa, GELSYN-3, Hyalgan, Hymovis, Monovisc, Orthovisc, Supartz, Supartz FX, TriVisc, VISCO What should I tell my health care provider before I take this medicine? They need to know if you have any of these conditions:  bleeding disorders  glaucoma  infection in the knee joint  skin conditions or sensitivity  skin infection  an unusual allergic reaction to sodium hyaluronate, other medicines, foods, dyes, or preservatives. Different brands of sodium hyaluronate contain different allergens. Some may contain egg. Talk to your doctor about your allergies to make sure that you get the right product.  pregnant or trying to get pregnant  breast-feeding How should I use this medicine? This medicine is for injection into the knee joint. It is given by a health care professional in a hospital or clinic setting. Talk to your pediatrician regarding the use of this medicine in children. Special care may be needed. Overdosage: If you think you have taken too much of this medicine contact a poison control center or emergency room at once. NOTE: This medicine is only for you. Do not share this medicine with others. What if I miss a dose? This does not apply. What may interact with this medicine? Interactions are not expected. This list may not  describe all possible interactions. Give your health care provider a list of all the medicines, herbs, non-prescription drugs, or dietary supplements you use. Also tell them if you smoke, drink alcohol, or use illegal drugs. Some items may interact with your medicine. What should I watch for while using this medicine? Tell your doctor or healthcare professional if your symptoms do not start to get better or if they get worse. If receiving this medicine for osteoarthritis, limit your activity after you receive your injection. Avoid physical activity for 48 hours following your injection to keep your knee from swelling. Do not stand on your feet for more than 1 hour at a time during the first 48 hours following your injection. Ask your doctor or healthcare professional about when you can begin major physical activity again. What side effects may I notice from receiving this medicine? Side effects that you should report to your doctor or health care professional as soon as possible:  allergic reactions like skin rash, itching or hives, swelling of the face, lips, or tongue  dizziness  facial flushing  pain, tingling, numbness in the hands or feet  vision changes if received this medicine during eye surgery Side effects that usually do not require medical attention (report to your doctor or health care professional if they continue or are bothersome):  back pain  bruising at site where injected  chills  diarrhea  fever  headache  joint pain  joint stiffness  joint swelling  muscle cramps  muscle pain  nausea, vomiting  pain, redness, or irritation at site where injected  weak or tired This list may not describe all possible side effects. Call your doctor  for medical advice about side effects. You may report side effects to FDA at 1-800-FDA-1088. Where should I keep my medicine? This drug is given in a hospital or clinic and will not be stored at home. NOTE: This sheet is a  summary. It may not cover all possible information. If you have questions about this medicine, talk to your doctor, pharmacist, or health care provider.  2021 Elsevier/Gold Standard (2015-06-28 08:34:51)

## 2020-08-04 NOTE — ED Provider Notes (Addendum)
Stacy Woods CARE    CSN: [id number] Arrival date & time: 08/04/20  1008      History   Chief Complaint Chief Complaint  Patient presents with  . Knee Pain    HPI Stacy Woods is a 55 y.o. female.   HPI  Patient has multiple medical problems including diabetes, hypertension, arthritis pain both knees Patient states that she received Orthovisc injections in each knee yesterday.  It was not her first injection.  She states that the injections were at 9:00 in the morning and by 7:00 at night she could hardly move her left leg.  She is here today for increased left leg pain.  She states the pain is not relieved with ice and Tylenol.  She has had no fever.  She had no difficulty with prior injections into her knee  Past Medical History:  Diagnosis Date  . AK (actinic keratosis) 07/14/2017   Right upper chest wall  . Anemia   . Anemia, iron deficiency 05/02/2015  . Fibromyalgia   . History of gastric bypass 05/09/2016  . Hypertension   . Insomnia 08/14/2017  . Malabsorption of iron 05/09/2016  . PONV (postoperative nausea and vomiting)   . Prediabetes 09/01/2013   March 2015   . Seizure ([place]) 2018   from low blood sugar   . Surgical menopause 03/24/2016    Patient Active Problem List   Diagnosis Date Noted  . Morning joint stiffness 08/28/2019  . Constipation 08/28/2019  . Right shoulder pain 07/27/2019  . Glaucoma 07/21/2019  . Transaminitis 12/01/2017  . MDD (recurrent major depressive disorder) in remission ([place]) 11/03/2017  . AK (actinic keratosis) 07/14/2017  . Low magnesium level 01/12/2017  . B12 deficiency 01/01/2017  . Trochanteric bursitis of left hip 10/08/2016  . Malabsorption of iron 05/09/2016  . Arthritis of knee, degenerative 04/14/2016  . Surgical menopause 03/24/2016  . Family history of breast cancer 03/24/2016  . Primary osteoarthritis of both knees 10/01/2015  . Hypoglycemia 06/12/2015  . Cubital tunnel syndrome on right 09/14/2014  .  Degenerative disc disease, cervical 07/27/2014  . Fibromyalgia 04/11/2013  . Essential hypertension, benign 04/11/2013  . Anxiety state 04/11/2013  . Iron deficiency anemia 09/04/2011  . Palpitations 09/04/2011  . History of gastric bypass 09/03/2011  . Insomnia 08/16/2011  . Osteoarthrosis, unspecified whether generalized or localized, lower leg 08/16/2011  . Myalgia and myositis, unspecified 08/16/2011  . Acquired absence of organ, genital organs 08/16/2011  . Irritable bowel syndrome 08/16/2011  . Seborrheic dermatitis 08/16/2011    Past Surgical History:  Procedure Laterality Date  . ABDOMINAL HYSTERECTOMY  1997  . APPENDECTOMY    . CESAREAN SECTION  [id number]  . CHOLECYSTECTOMY    . GASTRIC BYPASS  2011  . KNEE ARTHROSCOPY  2002  . TONSILLECTOMY  1977  . ULNAR NERVE TRANSPOSITION Right 06/12/2015   Procedure: RIGHT CUBITAL TUNNEL RELEASE;  Surgeon: Milly Jakob, MD;  Location: [facility];  Service: Orthopedics;  Laterality: Right;    OB History   No obstetric history on file.      Home Medications    Prior to Admission medications   Medication Sig Start Date End Date Taking? Authorizing Provider  blood glucose meter kit and supplies Dispense based on patient and insurance preference. Use up to four times daily as directed. (FOR ICD-10 E10.9, E11.9). 08/31/19  Yes [name], DO  cyclobenzaprine (FLEXERIL) 10 MG tablet Take 1 tablet (10 mg total) by mouth 3 (three) times daily as  needed for muscle spasms. 11/24/19  Yes Melvenia Beam, MD  diclofenac (VOLTAREN) 75 MG EC tablet Take 1 tablet (75 mg total) by mouth 2 (two) times daily. 07/03/20 07/03/21 Yes Silverio Decamp, MD  escitalopram (LEXAPRO) 10 MG tablet TAKE 1 TABLET (10 MG TOTAL) BY MOUTH DAILY. 08/02/20  Yes [name], DO  glucagon 1 MG injection Inject 1 mg into the muscle once as needed for up to 1 dose. For low blood sugar 11/12/18  Yes Gregor Hams, MD  Hyaluronan (ORTHOVISC) 30  MG/2ML SOSY 1 syringe injected into each knee weekly x4.  Orthovisc DAW.  Please dispense #8 syringes. 07/13/20  Yes Silverio Decamp, MD  HYDROcodone-acetaminophen (NORCO) 7.5-325 MG tablet Take 1 tablet by mouth every 6 (six) hours as needed for moderate pain. 08/04/20  Yes Raylene Everts, MD  Lancets 30G MISC Check fasting blood sugar daily 02/06/17  Yes Gregor Hams, MD  lisinopril-hydrochlorothiazide (ZESTORETIC) 10-12.5 MG tablet Take 1 tablet by mouth daily. 07/20/20  Yes Samuel Bouche, NP  zolpidem (AMBIEN) 5 MG tablet TAKE 1 TABLET BY MOUTH EVERY NIGHT AT BEDTIME AS NEEDED FOR SLEEP 03/01/20  Yes [name], DO  HYDROcodone-homatropine (HYCODAN) 5-1.5 MG/5ML syrup Take 5 mLs by mouth every 6 (six) hours as needed for cough. 07/20/20   Samuel Bouche, NP  traMADol (ULTRAM) 50 MG tablet Take 1 tablet (50 mg total) by mouth 2 (two) times daily as needed for moderate pain. 06/20/20   Samuel Bouche, NP    Family History Family History  Problem Relation Age of Onset  . Diabetes Father   . Hypertension Father   . Stroke Father   . Heart failure Father   . CAD Father        has stents  . Breast cancer Mother   . Migraines Sister   . Colon cancer Neg Hx   . Colon polyps Neg Hx   . Esophageal cancer Neg Hx   . Stomach cancer Neg Hx   . Rectal cancer Neg Hx     Social History Social History   Tobacco Use  . Smoking status: Never Smoker  . Smokeless tobacco: Never Used  Vaping Use  . Vaping Use: Never used  Substance Use Topics  . Alcohol use: No  . Drug use: No     Allergies   Mobic [meloxicam], Naproxen, Nsaids, Prednisone, Shellfish allergy, Lyrica [pregabalin], and Percocet [oxycodone-acetaminophen]   Review of Systems Review of Systems See HPI  Physical Exam Triage Vital Signs ED Triage Vitals  Enc Vitals Group     BP 08/04/20 1024 111/87     Pulse Rate 08/04/20 1024 87     Resp -- 14     Temp -- 97.8     Temp src -- po     SpO2 08/04/20 1024 99 %      Weight --      Height --      Head Circumference --      Peak Flow --      Pain Score 08/04/20 1025 10     Pain Loc --      Pain Edu? --      Excl. in [place]? --    No data found.  Updated Vital Signs BP 111/87 (BP Location: Left Arm)   Pulse 87   SpO2 99%     Physical Exam Constitutional:      General: She is not in acute distress.    Appearance: She is  well-developed and well-nourished.     Comments: Obese.  Antalgic gait.  Resist putting full weight on the left leg.  Mask is in place  HENT:     Head: Normocephalic and atraumatic.     Mouth/Throat:     Mouth: Oropharynx is clear and moist.  Eyes:     Conjunctiva/sclera: Conjunctivae normal.     Pupils: Pupils are equal, round, and reactive to light.  Cardiovascular:     Rate and Rhythm: Normal rate.  Pulmonary:     Effort: Pulmonary effort is normal. No respiratory distress.  Abdominal:     General: There is no distension.     Palpations: Abdomen is soft.  Musculoskeletal:        General: No edema. Normal range of motion.     Cervical back: Normal range of motion.     Comments: Both knees are examined.  Left knee has trace effusion.  Warmth is present bilaterally.  No erythema.  Mild tenderness to palpation of the medial and lateral joint line.  Mild tenderness popliteal fossa as well.  No distal edema  Skin:    General: Skin is warm and dry.  Neurological:     Mental Status: She is alert.     Gait: Gait abnormal.  Psychiatric:        Behavior: Behavior normal.      UC Treatments / Results  Labs (all labs ordered are listed, but only abnormal results are displayed) Labs Reviewed - No data to display  EKG   Radiology   Procedures Procedures (including critical care time)  Medications Ordered in UC Medications  ketorolac (TORADOL) 30 MG/ML injection 30 mg (30 mg Intramuscular Given 08/04/20 1102)    Initial Impression / Assessment and Plan / UC Course  I have reviewed the triage vital signs and the  nursing notes.  Pertinent labs & imaging results that were available during my care of the patient were reviewed by me and considered in my medical decision making (see chart for details).     I spoke with Dr. Victorino December of [facility].  He is on-call for orthopedic problems for [facility].  He was kind enough to discuss case.  He told me that any injection into the knee, even Orthovisc which is fairly benign, can cause a synovitis reaction.  If I do not see evidence of infection then pain management is appropriate. Final Clinical Impressions(s) / UC Diagnoses   Final diagnoses:  Acute pain of left knee  Synovitis of left knee     Discharge Instructions     Stay off of feet while knee is painful Use ice for 20 minutes every couple of hours Take hydrocodone as needed for severe pain.  Do not drive on hydrocodone Call Dr. Dianah Field Monday   ED Prescriptions    Medication Sig Dispense Auth. Provider   HYDROcodone-acetaminophen (NORCO) 7.5-325 MG tablet Take 1 tablet by mouth every 6 (six) hours as needed for moderate pain. 15 tablet Raylene Everts, MD     I have reviewed the PDMP during this encounter.   Raylene Everts, MD 08/04/20 1214    Raylene Everts, MD 08/04/20 1215

## 2020-08-04 NOTE — Discharge Instructions (Addendum)
Stay off of feet while knee is painful Use ice for 20 minutes every couple of hours Take hydrocodone as needed for severe pain.  Do not drive on hydrocodone Call Dr. Dianah Field Monday

## 2020-08-04 NOTE — ED Triage Notes (Signed)
Patient states that she had Orthovisc injection yesterday in both knees by Dr Ruby Cola.  Patient is unable to straighten left knee, pain is radiating down the front of leg and pain in the back of the calf last night.  Patient has taken Tylenol Arthritis at 6am this morning.

## 2020-08-04 NOTE — Progress Notes (Signed)
I connected with  Stacy Woods on 08/04/20 by a video enabled telemedicine application and verified that I am speaking with the correct person using two identifiers.   I discussed the limitations of evaluation and management by telemedicine. The patient expressed understanding and agreed to proceed.    New Patient Office Visit  Subjective:  Patient ID: Stacy Woods, female    DOB: 1966-04-08  Age: 55 y.o. MRN: [id number]  CC:  Chief Complaint  Patient presents with  . Knee Pain   Virtual Visit via Video Note  I connected with Stacy Woods on 08/04/20 at  9:15 AM EST by a video enabled telemedicine application and verified that I am speaking with the correct person using two identifiers.  After several unsuccessful attempts of trying to connect to patient through video visit, visit was completed with audio only.  Location: Patient: Home Provider: Working remotely from home   I discussed the limitations of evaluation and management by telemedicine and the availability of in person appointments. The patient expressed understanding and agreed to proceed.  History of Present Illness: Reports that she had injections in both knees completed yesterday during an orthopedic visit , states that her left knee is in "extreme pain" and it is affecting her being able to walk or bend her knee.  States that she used Tylenol without relief states that she has not reached out to orthopedics yet.   Observations/Objective: Medical history and current medications reviewed, no physical exam completed    Past Medical History:  Diagnosis Date  . AK (actinic keratosis) 07/14/2017   Right upper chest wall  . Anemia   . Anemia, iron deficiency 05/02/2015  . Fibromyalgia   . History of gastric bypass 05/09/2016  . Hypertension   . Insomnia 08/14/2017  . Malabsorption of iron 05/09/2016  . PONV (postoperative nausea and vomiting)   . Prediabetes 09/01/2013   March 2015   . Seizure ([place]) 2018   from  low blood sugar   . Surgical menopause 03/24/2016    Past Surgical History:  Procedure Laterality Date  . ABDOMINAL HYSTERECTOMY  1997  . APPENDECTOMY    . CESAREAN SECTION  [id number]  . CHOLECYSTECTOMY    . GASTRIC BYPASS  2011  . KNEE ARTHROSCOPY  2002  . TONSILLECTOMY  1977  . ULNAR NERVE TRANSPOSITION Right 06/12/2015   Procedure: RIGHT CUBITAL TUNNEL RELEASE;  Surgeon: Milly Jakob, MD;  Location: [facility];  Service: Orthopedics;  Laterality: Right;    Family History  Problem Relation Age of Onset  . Diabetes Father   . Hypertension Father   . Stroke Father   . Heart failure Father   . CAD Father        has stents  . Breast cancer Mother   . Migraines Sister   . Colon cancer Neg Hx   . Colon polyps Neg Hx   . Esophageal cancer Neg Hx   . Stomach cancer Neg Hx   . Rectal cancer Neg Hx     Social History   Socioeconomic History  . Marital status: Divorced    Spouse name: Not on file  . Number of children: 2  . Years of education: Assoc  . Highest education level: Not on file  Occupational History  . Occupation: [employer]  Tobacco Use  . Smoking status: Never Smoker  . Smokeless tobacco: Never Used  Vaping Use  . Vaping Use: Never used  Substance and Sexual Activity  . Alcohol  use: No  . Drug use: No  . Sexual activity: Not Currently    Birth control/protection: Surgical    Comment: Hysterectomy  Other Topics Concern  . Not on file  Social History Narrative   Lives alone   Right-handed   Caffeine: <16 oz soda daily   Social Determinants of Health   Financial Resource Strain: Not on file  Food Insecurity: Not on file  Transportation Needs: Not on file  Physical Activity: Not on file  Stress: Not on file  Social Connections: Not on file  Intimate Partner Violence: Not on file    ROS Review of Systems  Constitutional: Negative for chills and fever.  HENT: Negative.   Eyes: Negative.   Respiratory: Negative.   Cardiovascular:  Negative.   Gastrointestinal: Negative.   Endocrine: Negative.   Genitourinary: Negative.   Musculoskeletal: Positive for arthralgias and gait problem.  Skin: Negative.   Allergic/Immunologic: Negative.   Hematological: Negative.   Psychiatric/Behavioral: Negative.     Objective:   Today's Vitals: There were no vitals taken for this visit.    Assessment & Plan:   Problem List Items Addressed This Visit   None   Visit Diagnoses    Acute pain of left knee    -  Primary      Outpatient Encounter Medications as of 08/04/2020  Medication Sig  . blood glucose meter kit and supplies Dispense based on patient and insurance preference. Use up to four times daily as directed. (FOR ICD-10 E10.9, E11.9).  . cyclobenzaprine (FLEXERIL) 10 MG tablet Take 1 tablet (10 mg total) by mouth 3 (three) times daily as needed for muscle spasms.  . diclofenac (VOLTAREN) 75 MG EC tablet Take 1 tablet (75 mg total) by mouth 2 (two) times daily.  Marland Kitchen escitalopram (LEXAPRO) 10 MG tablet TAKE 1 TABLET (10 MG TOTAL) BY MOUTH DAILY.  Marland Kitchen glucagon 1 MG injection Inject 1 mg into the muscle once as needed for up to 1 dose. For low blood sugar  . Hyaluronan (ORTHOVISC) 30 MG/2ML SOSY 1 syringe injected into each knee weekly x4.  Orthovisc DAW.  Please dispense #8 syringes.  Marland Kitchen HYDROcodone-homatropine (HYCODAN) 5-1.5 MG/5ML syrup Take 5 mLs by mouth every 6 (six) hours as needed for cough.  . Lancets 30G MISC Check fasting blood sugar daily  . lisinopril-hydrochlorothiazide (ZESTORETIC) 10-12.5 MG tablet Take 1 tablet by mouth daily.  . traMADol (ULTRAM) 50 MG tablet Take 1 tablet (50 mg total) by mouth 2 (two) times daily as needed for moderate pain.  Marland Kitchen zolpidem (AMBIEN) 5 MG tablet TAKE 1 TABLET BY MOUTH EVERY NIGHT AT BEDTIME AS NEEDED FOR SLEEP   No facility-administered encounter medications on file as of 08/04/2020.   Assessment and Plan: 1. Acute pain of left knee On review of patient's chart, she had an  injection of Orthovisc in both knees yesterday.  Explained to patient that this could be an adverse reaction to the medication, encourage patient to contact orthopedic provider, or present to Ortho urgent care or emergency department for further evaluation.  Patient understands and agrees.   Follow Up Instructions:    I discussed the assessment and treatment plan with the patient. The patient was provided an opportunity to ask questions and all were answered. The patient agreed with the plan and demonstrated an understanding of the instructions.   The patient was advised to call back or seek an in-person evaluation if the symptoms worsen or if the condition fails to improve as anticipated.  I provided 9  minutes of non-face-to-face time during this encounter.       Follow-up: No follow-ups on file.   Loraine Grip Mayers, PA-C

## 2020-08-06 ENCOUNTER — Other Ambulatory Visit: Payer: Self-pay

## 2020-08-06 ENCOUNTER — Inpatient Hospital Stay: Payer: No Typology Code available for payment source

## 2020-08-06 VITALS — BP 124/74 | HR 97 | Temp 98.2°F | Resp 17

## 2020-08-06 DIAGNOSIS — D508 Other iron deficiency anemias: Secondary | ICD-10-CM

## 2020-08-06 MED ORDER — SODIUM CHLORIDE 0.9 % IV SOLN
Freq: Once | INTRAVENOUS | Status: AC
  Filled 2020-08-06: qty 250

## 2020-08-06 MED ORDER — SODIUM CHLORIDE 0.9 % IV SOLN
200.0000 mg | Freq: Once | INTRAVENOUS | Status: AC
  Administered 2020-08-06: 200 mg via INTRAVENOUS
  Filled 2020-08-06: qty 200

## 2020-08-06 NOTE — Patient Instructions (Signed)

## 2020-08-10 ENCOUNTER — Ambulatory Visit (INDEPENDENT_AMBULATORY_CARE_PROVIDER_SITE_OTHER): Payer: No Typology Code available for payment source | Admitting: Sports Medicine

## 2020-08-10 ENCOUNTER — Ambulatory Visit (INDEPENDENT_AMBULATORY_CARE_PROVIDER_SITE_OTHER): Payer: No Typology Code available for payment source

## 2020-08-10 ENCOUNTER — Other Ambulatory Visit: Payer: Self-pay

## 2020-08-10 DIAGNOSIS — M170 Bilateral primary osteoarthritis of knee: Secondary | ICD-10-CM | POA: Diagnosis not present

## 2020-08-10 MED ORDER — KETOROLAC TROMETHAMINE 10 MG PO TABS: 10.0000 mg | ORAL_TABLET | Freq: Three times a day (TID) | ORAL | 0 refills | Status: DC | PRN

## 2020-08-10 NOTE — Progress Notes (Signed)
    Procedures performed today:    Procedure: Real-time Ultrasound Guided injection of the left knee Device: Samsung HS60  Verbal informed consent obtained.  Time-out conducted.  Noted no overlying erythema, induration, or other signs of local infection.  Skin prepped in a sterile fashion.  Local anesthesia: Topical Ethyl chloride.  With sterile technique and under real time ultrasound guidance:  Noted trace effusion, 30 mg/2 mL of OrthoVisc (sodium hyaluronate) in a prefilled syringe was injected easily into the knee through a 22-gauge needle.   Completed without difficulty  Advised to call if fevers/chills, erythema, induration, drainage, or persistent bleeding.  Images permanently stored and available for review in PACS.  Impression: Technically successful ultrasound guided injection.  Procedure: Real-time Ultrasound Guided injection of the right knee Device: Samsung HS60  Verbal informed consent obtained.  Time-out conducted.  Noted no overlying erythema, induration, or other signs of local infection.  Skin prepped in a sterile fashion.  Local anesthesia: Topical Ethyl chloride.  With sterile technique and under real time ultrasound guidance:  Noted trace effusion, 30 mg/2 mL of OrthoVisc (sodium hyaluronate) in a prefilled syringe was injected easily into the knee through a 22-gauge needle.   Completed without difficulty  Advised to call if fevers/chills, erythema, induration, drainage, or persistent bleeding.  Images permanently stored and available for review in PACS.  Impression: Technically successful ultrasound guided injection.  Independent interpretation of notes and tests performed by another provider:   None.  Brief History, Exam, Impression, and Recommendations:    Primary osteoarthritis of both knees This is a very pleasant 55 year old female, we started Orthovisc at the last visit, she had an injection flare in her left knee, this resolved after 24 to 48  hours. She did present to urgent care, we did Orthovisc No. 2 of 4 into both knees today, she will do oral Toradol today and tomorrow. Return to see me in a week for Orthovisc No. 3 of 4 both knees.    ___________________________________________ Gwen Her. Dianah Field, M.D., ABFM., CAQSM. Primary Care and [place] Instructor of [place] of [facility] of Medicine

## 2020-08-10 NOTE — Assessment & Plan Note (Signed)
This is a very pleasant 55 year old female, we started Orthovisc at the last visit, she had an injection flare in her left knee, this resolved after 24 to 48 hours. She did present to urgent care, we did Orthovisc No. 2 of 4 into both knees today, she will do oral Toradol today and tomorrow. Return to see me in a week for Orthovisc No. 3 of 4 both knees.

## 2020-08-17 ENCOUNTER — Ambulatory Visit (INDEPENDENT_AMBULATORY_CARE_PROVIDER_SITE_OTHER): Payer: No Typology Code available for payment source

## 2020-08-17 ENCOUNTER — Ambulatory Visit (INDEPENDENT_AMBULATORY_CARE_PROVIDER_SITE_OTHER): Payer: No Typology Code available for payment source | Admitting: Sports Medicine

## 2020-08-17 ENCOUNTER — Other Ambulatory Visit: Payer: Self-pay

## 2020-08-17 DIAGNOSIS — M170 Bilateral primary osteoarthritis of knee: Secondary | ICD-10-CM | POA: Diagnosis not present

## 2020-08-17 NOTE — Assessment & Plan Note (Addendum)
Still with some discomfort, oral Toradol post injection seems to help. Orthovisc 3 of 4 into both knees, return in 1 week for #4.

## 2020-08-17 NOTE — Progress Notes (Signed)
    Procedures performed today:    Procedure: Real-time Ultrasound Guidedinjection of the left knee Device: Samsung HS60  Verbal informed consent obtained.  Time-out conducted.  Noted no overlying erythema, induration, or other signs of local infection.  Skin prepped in a sterile fashion.  Local anesthesia: Topical Ethyl chloride.  With sterile technique and under real time ultrasound guidance: Noted trace effusion, 30 mg/2 mL of OrthoVisc (sodium hyaluronate) in a prefilled syringe was injected easily into the knee through a 22-gauge needle.   Completed without difficulty  Advised to call if fevers/chills, erythema, induration, drainage, or persistent bleeding.  Images permanently stored and available for review in PACS.  Impression: Technically successful ultrasound guided injection.  Procedure: Real-time Ultrasound Guidedinjection of the right knee Device: Samsung HS60  Verbal informed consent obtained.  Time-out conducted.  Noted no overlying erythema, induration, or other signs of local infection.  Skin prepped in a sterile fashion.  Local anesthesia: Topical Ethyl chloride.  With sterile technique and under real time ultrasound guidance: Noted trace effusion, 30 mg/2 mL of OrthoVisc (sodium hyaluronate) in a prefilled syringe was injected easily into the knee through a 22-gauge needle.   Completed without difficulty  Advised to call if fevers/chills, erythema, induration, drainage, or persistent bleeding.  Images permanently stored and available for review in PACS.  Impression: Technically successful ultrasound guided injection.  Independent interpretation of notes and tests performed by another provider:   None.  Brief History, Exam, Impression, and Recommendations:    Primary osteoarthritis of both knees Still with some discomfort, oral Toradol post injection seems to help. Orthovisc 3 of 4 into both knees, return in 1 week for  #4.    ___________________________________________ Gwen Her. Dianah Field, M.D., ABFM., CAQSM. Primary Care and [place] Instructor of [place] of [facility] of Medicine

## 2020-08-20 ENCOUNTER — Encounter: Payer: Self-pay | Admitting: Medical-Surgical

## 2020-08-20 ENCOUNTER — Telehealth (INDEPENDENT_AMBULATORY_CARE_PROVIDER_SITE_OTHER): Payer: No Typology Code available for payment source | Admitting: Medical-Surgical

## 2020-08-20 DIAGNOSIS — H9319 Tinnitus, unspecified ear: Secondary | ICD-10-CM | POA: Diagnosis not present

## 2020-08-20 NOTE — Progress Notes (Signed)
Virtual Visit via Video Note  I connected with Stacy Woods on 08/20/20 at  1:40 PM EDT by a video enabled telemedicine application and verified that I am speaking with the correct person using two identifiers.   I discussed the limitations of evaluation and management by telemedicine and the availability of in person appointments. The patient expressed understanding and agreed to proceed.  Patient location: home Provider locations: office  Subjective:    CC: tinnitus  HPI: Pleasant 55 year old female presenting via MyChart video visit with complaints of right ear tinnitus that has worsened over the last 2 months. Notes that ringing in the ear is a medium pitched hum and was previously intermittent but is now constant, steady. No changes in medications. No hearing loss. No dizziness, headaches, or other associated symptoms. Does take Voltaren PO but started this after the ringing in her ear started. Using Toradol sporadically. Has not been evaluated by ENT before.   Past medical history, Surgical history, Family history not pertinant except as noted below, Social history, Allergies, and medications have been entered into the medical record, reviewed, and corrections made.   Review of Systems: See HPI for pertinent positives and negatives.   Objective:    General: Speaking clearly in complete sentences without any shortness of breath.  Alert and oriented x3.  Normal judgment. No apparent acute distress.  Impression and Recommendations:    1. Tinnitus, unspecified laterality Several possibilities for cause of tinnitus. Due to virtual nature of the appointment, unable to evaluate for anatomical concerns, cerumen impaction, etc. Pulsatile tinnitus absent so low suspicion for vascular etiology. Recommend evaluation by ENT, patient agreeable. Referral placed.  - Ambulatory referral to ENT  I discussed the assessment and treatment plan with the patient. The patient was provided an opportunity  to ask questions and all were answered. The patient agreed with the plan and demonstrated an understanding of the instructions.   The patient was advised to call back or seek an in-person evaluation if the symptoms worsen or if the condition fails to improve as anticipated.  20 minutes of non-face-to-face time was provided during this encounter.  Return if symptoms worsen or fail to improve.  Clearnce Sorrel, DNP, APRN, FNP-BC [facility]

## 2020-08-23 ENCOUNTER — Encounter: Payer: Self-pay | Admitting: Medical-Surgical

## 2020-08-23 DIAGNOSIS — H9319 Tinnitus, unspecified ear: Secondary | ICD-10-CM

## 2020-08-23 DIAGNOSIS — M170 Bilateral primary osteoarthritis of knee: Secondary | ICD-10-CM

## 2020-08-24 ENCOUNTER — Ambulatory Visit: Payer: No Typology Code available for payment source | Admitting: Sports Medicine

## 2020-08-29 ENCOUNTER — Ambulatory Visit (INDEPENDENT_AMBULATORY_CARE_PROVIDER_SITE_OTHER): Payer: No Typology Code available for payment source

## 2020-08-29 ENCOUNTER — Ambulatory Visit (INDEPENDENT_AMBULATORY_CARE_PROVIDER_SITE_OTHER): Payer: No Typology Code available for payment source | Admitting: Sports Medicine

## 2020-08-29 ENCOUNTER — Other Ambulatory Visit: Payer: Self-pay

## 2020-08-29 DIAGNOSIS — M170 Bilateral primary osteoarthritis of knee: Secondary | ICD-10-CM

## 2020-08-29 NOTE — Assessment & Plan Note (Signed)
Right knee is doing well, left knee still has some discomfort. Oral Toradol post injection was minimally efficacious, she will do some of her narcotic 3 times daily for 2 days post this injection today, today we did Orthovisc 4 of for both knees, return in a month and a virtual visit to reevaluate.

## 2020-08-29 NOTE — Progress Notes (Signed)
    Procedures performed today:    Procedure: Real-time Ultrasound Guidedinjection of theleft knee Device: Samsung HS60  Verbal informed consent obtained.  Time-out conducted.  Noted no overlying erythema, induration, or other signs of local infection.  Skin prepped in a sterile fashion.  Local anesthesia: Topical Ethyl chloride.  With sterile technique and under real time ultrasound guidance:Noted trace effusion, 30 mg/2 mL of OrthoVisc (sodium hyaluronate) in a prefilled syringe was injected easily into the knee through a 22-gauge needle.  Completed without difficulty  Advised to call if fevers/chills, erythema, induration, drainage, or persistent bleeding.  Images permanently stored and available for review in PACS.  Impression: Technically successful ultrasound guided injection.  Procedure: Real-time Ultrasound Guidedinjection of therightknee Device: Samsung HS60  Verbal informed consent obtained.  Time-out conducted.  Noted no overlying erythema, induration, or other signs of local infection.  Skin prepped in a sterile fashion.  Local anesthesia: Topical Ethyl chloride.  With sterile technique and under real time ultrasound guidance:Noted trace effusion, 30 mg/2 mL of OrthoVisc (sodium hyaluronate) in a prefilled syringe was injected easily into the knee through a 22-gauge needle.  Completed without difficulty  Advised to call if fevers/chills, erythema, induration, drainage, or persistent bleeding.  Images permanently stored and available for review in PACS.  Impression: Technically successful ultrasound guided injection.  Independent interpretation of notes and tests performed by another provider:   None.  Brief History, Exam, Impression, and Recommendations:    Primary osteoarthritis of both knees Right knee is doing well, left knee still has some discomfort. Oral Toradol post injection was minimally efficacious, she will do some of her narcotic 3 times daily  for 2 days post this injection today, today we did Orthovisc 4 of for both knees, return in a month and a virtual visit to reevaluate.    ___________________________________________ Gwen Her. Dianah Field, M.D., ABFM., CAQSM. Primary Care and [place] Instructor of [place] [facility]

## 2020-09-03 ENCOUNTER — Other Ambulatory Visit: Payer: Self-pay | Admitting: Neurology

## 2020-09-05 ENCOUNTER — Other Ambulatory Visit: Payer: Self-pay | Admitting: Neurology

## 2020-09-05 ENCOUNTER — Other Ambulatory Visit: Payer: Self-pay

## 2020-09-05 MED ORDER — ZOLPIDEM TARTRATE 5 MG PO TABS: 5.0000 mg | ORAL_TABLET | Freq: Every evening | ORAL | 5 refills | Status: DC | PRN

## 2020-09-06 ENCOUNTER — Other Ambulatory Visit: Payer: Self-pay | Admitting: Medical-Surgical

## 2020-09-06 MED ORDER — ZOLPIDEM TARTRATE 5 MG PO TABS: 5.0000 mg | ORAL_TABLET | Freq: Every evening | ORAL | 5 refills | Status: DC | PRN

## 2020-09-06 MED FILL — ZOLPIDEM TARTRATE 5 MG TAB: 5 | 30 days supply | Qty: 30 | Fill #0

## 2020-09-06 NOTE — Progress Notes (Signed)
Ambien refills sent to [facility] by mistake yesterday. Please contact [facility] and cancel the Ambien prescription with them. It has already been sent to the [facility].   Clearnce Sorrel, DNP, APRN, FNP-BC [facility]

## 2020-09-07 NOTE — Progress Notes (Signed)
Spoke with pharmacist at [facility] and they have cancelled the Rx that was sent in on 09/05/2020

## 2020-09-10 DIAGNOSIS — M170 Bilateral primary osteoarthritis of knee: Secondary | ICD-10-CM

## 2020-09-11 NOTE — Telephone Encounter (Signed)
I called and spoke with Butch Penny and [facility] is in Network and she is scheduled with them so we do not need to change her referral - CF

## 2020-09-12 ENCOUNTER — Other Ambulatory Visit: Payer: Self-pay | Admitting: Sports Medicine

## 2020-09-12 MED ORDER — TRIAZOLAM 0.25 MG PO TABS: ORAL_TABLET | ORAL | 0 refills | Status: DC

## 2020-09-16 ENCOUNTER — Ambulatory Visit (INDEPENDENT_AMBULATORY_CARE_PROVIDER_SITE_OTHER): Payer: No Typology Code available for payment source

## 2020-09-16 ENCOUNTER — Other Ambulatory Visit: Payer: Self-pay

## 2020-09-16 DIAGNOSIS — M1712 Unilateral primary osteoarthritis, left knee: Secondary | ICD-10-CM | POA: Diagnosis not present

## 2020-09-17 NOTE — Addendum Note (Signed)
Addended by: Silverio Decamp on: 09/17/2020 03:04 PM   Modules accepted: Orders

## 2020-09-24 ENCOUNTER — Ambulatory Visit: Payer: No Typology Code available for payment source | Attending: Medical-Surgical | Admitting: Audiologist

## 2020-09-24 ENCOUNTER — Other Ambulatory Visit: Payer: Self-pay

## 2020-09-24 DIAGNOSIS — H9311 Tinnitus, right ear: Secondary | ICD-10-CM | POA: Insufficient documentation

## 2020-09-24 DIAGNOSIS — H9041 Sensorineural hearing loss, unilateral, right ear, with unrestricted hearing on the contralateral side: Secondary | ICD-10-CM | POA: Diagnosis present

## 2020-09-24 NOTE — Procedures (Signed)
Outpatient Audiology and [place], [place]  [id number] [phone]  AUDIOLOGICAL  EVALUATION  NAME: Stacy Woods     DOB:   06-01-1966      MRN: [id number]                                                                                     DATE: 09/24/2020     REFERENT: Stacy Bouche, NP STATUS: Outpatient DIAGNOSIS: Tinnitus of right ear, Sensorineural hearing loss (SNHL) of right ear with unrestricted hearing of left ear    History: Stacy Woods was seen for an audiological evaluation.  Stacy Woods is receiving a hearing evaluation due to concerns for tinnitus in her right ear. Stacy Woods has no difficulty hearing. She hears a high pitched ringing sound in her right ear. Her left ear will occasionally ring but it always goes away. The right ear has been ringing for months. There was no illness, injury, new medication or noise exposure at the time the tinnitus started, it was just random. No pain or pressure reported in either ear today or at time of onset. Stacy Woods denied any history of noise exposure.  Medical history negative for any risk factor for hearing loss. No other relevant case history reported.    Evaluation:   Otoscopy showed a clear view of the tympanic membranes, bilaterally  Tympanometry results were consistent with normal middle ear function bilaterally    Distortion Product Otoacoustic Emissions (DPOAE's) were screened in the right ear only, present 2k-6k Hz with a decrease at 3k Hz.   Audiometric testing was completed using conventional audiometry with insert transducer then cross checked with high frequency supra aural. Speech Recognition Thresholds were consistent with pure tone averages. Word Recognition was excellent at conversation level. Pure tone thresholds show normal hearing loss in both ears with a sensorineural mild hearing loss notch centered at 4k Hz in the right ear only.  Tinnitus pitch and loudness matched to a 4k Hz pure tone at 8dB  SL. On a scale of 1-10 with 10 a perfect match, Stacy Woods rated this sound a 9.   Results:  The test results were reviewed with Stacy Woods. She has a mild hearing loss centered at the same pitched as her tinnitus in the right ear. She needs to follow up with an ENT Physician for a medical evaluation due to this slight asymmetry and unilateral tinnitus.  Stacy Woods reported understanding and already has a referral to see Dr. Lucia Gaskins. Patient provided a copy of her hearing test and instructed to bring it to appointment with Dr. Lucia Gaskins in case it is not visible in her chart.  Recommendations: 1. Referral to ENT Physician necessary due to asymmetric hearing loss with unilateral tinnitus. Patient already has referral to Dr. Lucia Gaskins placed.   2. Use of Bose Sleep Buds or the MyNoise App is recommended to mask tinnitus when needed. Use of masker should be at night and when in quiet environments where the tinnitus is loud or when around triggering sound.   Masker should be played at lowest level possible that provides relief from tinnitus.    Masker should be a steady state neutral sound like white  noise. Do not use music or TV as they continue to engage the auditory system.  Stacy Woods  Audiologist, Au.D., CCC-A 09/24/2020  8:52 AM  Cc: Stacy Bouche, NP

## 2020-09-24 NOTE — Addendum Note (Signed)
Addended bySamuel Bouche on: 09/24/2020 04:28 PM   Modules accepted: Orders

## 2020-09-26 ENCOUNTER — Other Ambulatory Visit: Payer: Self-pay

## 2020-09-26 ENCOUNTER — Other Ambulatory Visit (HOSPITAL_BASED_OUTPATIENT_CLINIC_OR_DEPARTMENT_OTHER): Payer: Self-pay

## 2020-09-26 ENCOUNTER — Ambulatory Visit (INDEPENDENT_AMBULATORY_CARE_PROVIDER_SITE_OTHER): Payer: No Typology Code available for payment source | Admitting: Medical-Surgical

## 2020-09-26 ENCOUNTER — Telehealth: Payer: No Typology Code available for payment source | Admitting: Sports Medicine

## 2020-09-26 ENCOUNTER — Encounter: Payer: Self-pay | Admitting: Medical-Surgical

## 2020-09-26 VITALS — BP 101/69 | HR 74 | Temp 99.0°F | Ht 63.5 in | Wt 220.0 lb

## 2020-09-26 DIAGNOSIS — E611 Iron deficiency: Secondary | ICD-10-CM | POA: Diagnosis not present

## 2020-09-26 DIAGNOSIS — E162 Hypoglycemia, unspecified: Secondary | ICD-10-CM

## 2020-09-26 DIAGNOSIS — Z0000 Encounter for general adult medical examination without abnormal findings: Secondary | ICD-10-CM | POA: Diagnosis not present

## 2020-09-26 DIAGNOSIS — L989 Disorder of the skin and subcutaneous tissue, unspecified: Secondary | ICD-10-CM

## 2020-09-26 DIAGNOSIS — I10 Essential (primary) hypertension: Secondary | ICD-10-CM | POA: Diagnosis not present

## 2020-09-26 DIAGNOSIS — Z1329 Encounter for screening for other suspected endocrine disorder: Secondary | ICD-10-CM

## 2020-09-26 MED ORDER — CYCLOBENZAPRINE HCL 10 MG PO TABS
ORAL_TABLET | Freq: Three times a day (TID) | ORAL | 2 refills | Status: DC | PRN
  Filled 2020-09-26: qty 90, 30d supply, fill #0

## 2020-09-26 NOTE — Progress Notes (Signed)
HPI: Stacy Woods is a 55 y.o. female who  has a past medical history of AK (actinic keratosis) (07/14/2017), Anemia, Anemia, iron deficiency (05/02/2015), Fibromyalgia, History of gastric bypass (05/09/2016), Hypertension, Insomnia (08/14/2017), Malabsorption of iron (05/09/2016), PONV (postoperative nausea and vomiting), Prediabetes (09/01/2013), Seizure ([employer]) (2018), and Surgical menopause (03/24/2016).  she presents to [facility] today, 09/26/20,  for chief complaint of: Annual physical exam  Dentist: UTD Eye exam: UTD, glasses  Exercise: walking regularly but limited by left knee pain Diet: started [employer] a few weeks ago Pap smear: Hysterectomy Mammogram: done in January Colon cancer screening: done COVID vaccine: done, boosted x 1  Concerns: Worried about a couple of skin lesions  Past medical, surgical, social and family history reviewed:  Patient Active Problem List   Diagnosis Date Noted  . Morning joint stiffness 08/28/2019  . Constipation 08/28/2019  . Right shoulder pain 07/27/2019  . Glaucoma 07/21/2019  . Transaminitis 12/01/2017  . MDD (recurrent major depressive disorder) in remission ([place]) 11/03/2017  . AK (actinic keratosis) 07/14/2017  . Low magnesium level 01/12/2017  . B12 deficiency 01/01/2017  . Trochanteric bursitis of left hip 10/08/2016  . Malabsorption of iron 05/09/2016  . Arthritis of knee, degenerative 04/14/2016  . Surgical menopause 03/24/2016  . Family history of breast cancer 03/24/2016  . Primary osteoarthritis of both knees 10/01/2015  . Hypoglycemia 06/12/2015  . Cubital tunnel syndrome on right 09/14/2014  . Degenerative disc disease, cervical 07/27/2014  . Fibromyalgia 04/11/2013  . Essential hypertension, benign 04/11/2013  . Anxiety state 04/11/2013  . Iron deficiency anemia 09/04/2011  . Palpitations 09/04/2011  . History of gastric bypass 09/03/2011  . Insomnia 08/16/2011  . Osteoarthrosis,  unspecified whether generalized or localized, lower leg 08/16/2011  . Myalgia and myositis, unspecified 08/16/2011  . Acquired absence of organ, genital organs 08/16/2011  . Irritable bowel syndrome 08/16/2011  . Seborrheic dermatitis 08/16/2011    Past Surgical History:  Procedure Laterality Date  . ABDOMINAL HYSTERECTOMY  1997  . APPENDECTOMY    . CESAREAN SECTION  [id number]  . CHOLECYSTECTOMY    . GASTRIC BYPASS  2011  . KNEE ARTHROSCOPY  2002  . TONSILLECTOMY  1977  . ULNAR NERVE TRANSPOSITION Right 06/12/2015   Procedure: RIGHT CUBITAL TUNNEL RELEASE;  Surgeon: Milly Jakob, MD;  Location: [facility];  Service: Orthopedics;  Laterality: Right;    Social History   Tobacco Use  . Smoking status: Never Smoker  . Smokeless tobacco: Never Used  Substance Use Topics  . Alcohol use: No    Family History  Problem Relation Age of Onset  . Diabetes Father   . Hypertension Father   . Stroke Father   . Heart failure Father   . CAD Father        has stents  . Breast cancer Mother   . Migraines Sister   . Colon cancer Neg Hx   . Colon polyps Neg Hx   . Esophageal cancer Neg Hx   . Stomach cancer Neg Hx   . Rectal cancer Neg Hx      Current medication list and allergy/intolerance information reviewed:    Current Outpatient Medications  Medication Sig Dispense Refill  . blood glucose meter kit and supplies Dispense based on patient and insurance preference. Use up to four times daily as directed. (FOR ICD-10 E10.9, E11.9). 1 each 3  . escitalopram (LEXAPRO) 10 MG tablet TAKE 1 TABLET BY MOUTH ONCE DAILY 90 tablet 3  .  glucagon 1 MG injection Inject 1 mg into the muscle once as needed for up to 1 dose. For low blood sugar 1 each 12  . ketorolac (TORADOL) 10 MG tablet Take 1 tablet (10 mg total) by mouth every 8 (eight) hours as needed. 20 tablet 0  . Lancets 30G MISC Check fasting blood sugar daily 50 each PRN  . lisinopril-hydrochlorothiazide (ZESTORETIC)  10-12.5 MG tablet TAKE 1 TABLET BY MOUTH ONCE DAILY 90 tablet 1  . zolpidem (AMBIEN) 5 MG tablet TAKE 1 TABLET (5 MG TOTAL) BY MOUTH AT BEDTIME AS NEEDED. FOR SLEEP 30 tablet 5  . cyclobenzaprine (FLEXERIL) 10 MG tablet TAKE 1 TABLET BY MOUTH THREE TIMES DAILY AS NEEDED FOR MUSCLE SPASMS 90 tablet 2   No current facility-administered medications for this visit.    Allergies  Allergen Reactions  . Mobic [Meloxicam] Swelling  . Naproxen Other (See Comments)    Swelling   . Nsaids Swelling    Swelling   . Prednisone Other (See Comments)    Swelling, blood sugar problems  . Shellfish Allergy Nausea Only and Swelling  . Lyrica [Pregabalin] Other (See Comments)    "zombie"  . Percocet [Oxycodone-Acetaminophen] Itching      Review of Systems:  Constitutional:  No  fever, no chills, No recent illness, No unintentional weight changes. No significant fatigue.   HEENT: + intermittent  headache, no vision change, no hearing change, No sore throat, No  sinus pressure  Cardiac: No  chest pain, No  pressure, No palpitations, No  Orthopnea  Respiratory:  No  shortness of breath. No  Cough  Gastrointestinal: No  abdominal pain, No  nausea, No  vomiting,  No  blood in stool, No  diarrhea, No  constipation   Musculoskeletal: No new myalgia/arthralgia  Skin: No  Rash, + concerning lesions  Genitourinary: No  incontinence, No  abnormal genital bleeding, No abnormal genital discharge  Hem/Onc: + easy bruising/bleeding, No  abnormal lymph node  Endocrine: No cold intolerance,  No heat intolerance. No polyuria/polydipsia/polyphagia   Neurologic: No  weakness, No  dizziness, No  slurred speech/focal weakness/facial droop  Psychiatric: No  concerns with depression, No  concerns with anxiety, No sleep problems, No mood problems  Exam:  BP 101/69   Pulse 74   Temp 99 F (37.2 C)   Ht 5' 3.5" (1.613 m)   Wt 220 lb (99.8 kg)   SpO2 100%   BMI 38.36 kg/m   Constitutional: VS see above.  General Appearance: alert, well-developed, well-nourished, NAD  Eyes: Normal lids and conjunctive, non-icteric sclera  Ears, Nose, Mouth, Throat: MMM, Normal external inspection ears/nares/mouth/lips/gums. TM normal bilaterally.    Neck: No masses, trachea midline. No thyroid enlargement. No tenderness/mass appreciated. No lymphadenopathy  Respiratory: Normal respiratory effort. no wheeze, no rhonchi, no rales  Cardiovascular: S1/S2 normal, no murmur, no rub/gallop auscultated. RRR. No lower extremity edema. Pedal pulse II/IV bilaterally PT. No carotid bruit or JVD. No abdominal aortic bruit.  Gastrointestinal: Nontender, no masses. No hepatomegaly, no splenomegaly. No hernia appreciated. Bowel sounds normal. Rectal exam deferred.   Musculoskeletal: Gait normal. No clubbing/cyanosis of digits.   Neurological: Normal balance/coordination. No tremor. No cranial nerve deficit on limited exam. Motor and sensation intact and symmetric. Cerebellar reflexes intact.   Skin: warm, dry, intact. No rash/ulcer. Small brown mole to the right cheek. Scaly lesion on the right anterior lower leg.     Psychiatric: Normal judgment/insight. Normal mood and affect. Oriented x3.    No  results found for this or any previous visit (from the past 23 hour(s)).  No results found.   ASSESSMENT/PLAN:   1. Annual physical exam Checking labs today. UTD on preventative care.  - Lipid panel  2. Essential hypertension, benign At goal. Continue Lisinopril-HCTZ as pescribed.   3. Skin lesion Referring to dermatology per patient request.  - Ambulatory referral to Dermatology  4. Iron deficiency Checking labs. These were already entered but for a different lab. Since we are getting a lipid panel today, reordering her IDA recheck labs. - CBC with Differential - Ferritin - Iron and TIBC - Reticulocytes - Iron,Total/Total Iron Binding Cap  5. Screening for endocrine disorder 6. Hypoglycemia Checking  hemoglobin A1c. - Hemoglobin A1c  Orders Placed This Encounter  Procedures  . Lipid panel  . CBC with Differential  . Ferritin  . Iron and TIBC  . Reticulocytes  . Hemoglobin A1c  . Iron,Total/Total Iron Binding Cap  . Ambulatory referral to Dermatology    Meds ordered this encounter  Medications  . cyclobenzaprine (FLEXERIL) 10 MG tablet    Sig: TAKE 1 TABLET BY MOUTH THREE TIMES DAILY AS NEEDED FOR MUSCLE SPASMS    Dispense:  90 tablet    Refill:  2    Order Specific Question:   Supervising Provider    Answer:   Emeterio Reeve [[id number]]    Patient Instructions   Preventive Care 53-13 Years Old, Female Preventive care refers to lifestyle choices and visits with your health care provider that can promote health and wellness. This includes:  A yearly physical exam. This is also called an annual wellness visit.  Regular dental and eye exams.  Immunizations.  Screening for certain conditions.  Healthy lifestyle choices, such as: ? Eating a healthy diet. ? Getting regular exercise. ? Not using drugs or products that contain nicotine and tobacco. ? Limiting alcohol use. What can I expect for my preventive care visit? Physical exam Your health care provider will check your:  Height and weight. These may be used to calculate your BMI (body mass index). BMI is a measurement that tells if you are at a healthy weight.  Heart rate and blood pressure.  Body temperature.  Skin for abnormal spots. Counseling Your health care provider may ask you questions about your:  Past medical problems.  Family's medical history.  Alcohol, tobacco, and drug use.  Emotional well-being.  Home life and relationship well-being.  Sexual activity.  Diet, exercise, and sleep habits.  Work and work Statistician.  Access to firearms.  Method of birth control.  Menstrual cycle.  Pregnancy history. What immunizations do I need? Vaccines are usually given at various  ages, according to a schedule. Your health care provider will recommend vaccines for you based on your age, medical history, and lifestyle or other factors, such as travel or where you work.   What tests do I need? Blood tests  Lipid and cholesterol levels. These may be checked every 5 years, or more often if you are over 57 years old.  Hepatitis C test.  Hepatitis B test. Screening  Lung cancer screening. You may have this screening every year starting at age 52 if you have a 30-pack-year history of smoking and currently smoke or have quit within the past 15 years.  Colorectal cancer screening. ? All adults should have this screening starting at age 87 and continuing until age 30. ? Your health care provider may recommend screening at age 62 if you are at increased  risk. ? You will have tests every 1-10 years, depending on your results and the type of screening test.  Diabetes screening. ? This is done by checking your blood sugar (glucose) after you have not eaten for a while (fasting). ? You may have this done every 1-3 years.  Mammogram. ? This may be done every 1-2 years. ? Talk with your health care provider about when you should start having regular mammograms. This may depend on whether you have a family history of breast cancer.  BRCA-related cancer screening. This may be done if you have a family history of breast, ovarian, tubal, or peritoneal cancers.  Pelvic exam and Pap test. ? This may be done every 3 years starting at age 1. ? Starting at age 79, this may be done every 5 years if you have a Pap test in combination with an HPV test. Other tests  STD (sexually transmitted disease) testing, if you are at risk.  Bone density scan. This is done to screen for osteoporosis. You may have this scan if you are at high risk for osteoporosis. Talk with your health care provider about your test results, treatment options, and if necessary, the need for more tests. Follow  these instructions at home: Eating and drinking  Eat a diet that includes fresh fruits and vegetables, whole grains, lean protein, and low-fat dairy products.  Take vitamin and mineral supplements as recommended by your health care provider.  Do not drink alcohol if: ? Your health care provider tells you not to drink. ? You are pregnant, may be pregnant, or are planning to become pregnant.  If you drink alcohol: ? Limit how much you have to 0-1 drink a day. ? Be aware of how much alcohol is in your drink. In the [place] one drink equals one 12 oz bottle of beer (355 mL), one 5 oz glass of wine (148 mL), or one 1 oz glass of hard liquor (44 mL).   Lifestyle  Take daily care of your teeth and gums. Brush your teeth every morning and night with fluoride toothpaste. Floss one time each day.  Stay active. Exercise for at least 30 minutes 5 or more days each week.  Do not use any products that contain nicotine or tobacco, such as cigarettes, e-cigarettes, and chewing tobacco. If you need help quitting, ask your health care provider.  Do not use drugs.  If you are sexually active, practice safe sex. Use a condom or other form of protection to prevent STIs (sexually transmitted infections).  If you do not wish to become pregnant, use a form of birth control. If you plan to become pregnant, see your health care provider for a prepregnancy visit.  If told by your health care provider, take low-dose aspirin daily starting at age 53.  Find healthy ways to cope with stress, such as: ? Meditation, yoga, or listening to music. ? Journaling. ? Talking to a trusted person. ? Spending time with friends and family. Safety  Always wear your seat belt while driving or riding in a vehicle.  Do not drive: ? If you have been drinking alcohol. Do not ride with someone who has been drinking. ? When you are tired or distracted. ? While texting.  Wear a helmet and other protective equipment during  sports activities.  If you have firearms in your house, make sure you follow all gun safety procedures. What's next?  Visit your health care provider once a year for an annual wellness visit.  Ask your health care provider how often you should have your eyes and teeth checked.  Stay up to date on all vaccines. This information is not intended to replace advice given to you by your health care provider. Make sure you discuss any questions you have with your health care provider. Document Revised: 02/28/2020 Document Reviewed: 02/04/2018 Elsevier Patient Education  2021 [place].    Follow-up plan: Return in about 6 months (around 03/28/2021) for HTN follow up.  Clearnce Sorrel, DNP, APRN, FNP-BC [facility]

## 2020-09-26 NOTE — Patient Instructions (Signed)
Preventive Care 84-53 Years Old, Female Preventive care refers to lifestyle choices and visits with your health care provider that can promote health and wellness. This includes:  A yearly physical exam. This is also called an annual wellness visit.  Regular dental and eye exams.  Immunizations.  Screening for certain conditions.  Healthy lifestyle choices, such as: ? Eating a healthy diet. ? Getting regular exercise. ? Not using drugs or products that contain nicotine and tobacco. ? Limiting alcohol use. What can I expect for my preventive care visit? Physical exam Your health care provider will check your:  Height and weight. These may be used to calculate your BMI (body mass index). BMI is a measurement that tells if you are at a healthy weight.  Heart rate and blood pressure.  Body temperature.  Skin for abnormal spots. Counseling Your health care provider may ask you questions about your:  Past medical problems.  Family's medical history.  Alcohol, tobacco, and drug use.  Emotional well-being.  Home life and relationship well-being.  Sexual activity.  Diet, exercise, and sleep habits.  Work and work Statistician.  Access to firearms.  Method of birth control.  Menstrual cycle.  Pregnancy history. What immunizations do I need? Vaccines are usually given at various ages, according to a schedule. Your health care provider will recommend vaccines for you based on your age, medical history, and lifestyle or other factors, such as travel or where you work.   What tests do I need? Blood tests  Lipid and cholesterol levels. These may be checked every 5 years, or more often if you are over 3 years old.  Hepatitis C test.  Hepatitis B test. Screening  Lung cancer screening. You may have this screening every year starting at age 73 if you have a 30-pack-year history of smoking and currently smoke or have quit within the past 15 years.  Colorectal cancer  screening. ? All adults should have this screening starting at age 52 and continuing until age 17. ? Your health care provider may recommend screening at age 49 if you are at increased risk. ? You will have tests every 1-10 years, depending on your results and the type of screening test.  Diabetes screening. ? This is done by checking your blood sugar (glucose) after you have not eaten for a while (fasting). ? You may have this done every 1-3 years.  Mammogram. ? This may be done every 1-2 years. ? Talk with your health care provider about when you should start having regular mammograms. This may depend on whether you have a family history of breast cancer.  BRCA-related cancer screening. This may be done if you have a family history of breast, ovarian, tubal, or peritoneal cancers.  Pelvic exam and Pap test. ? This may be done every 3 years starting at age 10. ? Starting at age 11, this may be done every 5 years if you have a Pap test in combination with an HPV test. Other tests  STD (sexually transmitted disease) testing, if you are at risk.  Bone density scan. This is done to screen for osteoporosis. You may have this scan if you are at high risk for osteoporosis. Talk with your health care provider about your test results, treatment options, and if necessary, the need for more tests. Follow these instructions at home: Eating and drinking  Eat a diet that includes fresh fruits and vegetables, whole grains, lean protein, and low-fat dairy products.  Take vitamin and mineral supplements  as recommended by your health care provider.  Do not drink alcohol if: ? Your health care provider tells you not to drink. ? You are pregnant, may be pregnant, or are planning to become pregnant.  If you drink alcohol: ? Limit how much you have to 0-1 drink a day. ? Be aware of how much alcohol is in your drink. In the [place] one drink equals one 12 oz bottle of beer (355 mL), one 5 oz glass of  wine (148 mL), or one 1 oz glass of hard liquor (44 mL).   Lifestyle  Take daily care of your teeth and gums. Brush your teeth every morning and night with fluoride toothpaste. Floss one time each day.  Stay active. Exercise for at least 30 minutes 5 or more days each week.  Do not use any products that contain nicotine or tobacco, such as cigarettes, e-cigarettes, and chewing tobacco. If you need help quitting, ask your health care provider.  Do not use drugs.  If you are sexually active, practice safe sex. Use a condom or other form of protection to prevent STIs (sexually transmitted infections).  If you do not wish to become pregnant, use a form of birth control. If you plan to become pregnant, see your health care provider for a prepregnancy visit.  If told by your health care provider, take low-dose aspirin daily starting at age 50.  Find healthy ways to cope with stress, such as: ? Meditation, yoga, or listening to music. ? Journaling. ? Talking to a trusted person. ? Spending time with friends and family. Safety  Always wear your seat belt while driving or riding in a vehicle.  Do not drive: ? If you have been drinking alcohol. Do not ride with someone who has been drinking. ? When you are tired or distracted. ? While texting.  Wear a helmet and other protective equipment during sports activities.  If you have firearms in your house, make sure you follow all gun safety procedures. What's next?  Visit your health care provider once a year for an annual wellness visit.  Ask your health care provider how often you should have your eyes and teeth checked.  Stay up to date on all vaccines. This information is not intended to replace advice given to you by your health care provider. Make sure you discuss any questions you have with your health care provider. Document Revised: 02/28/2020 Document Reviewed: 02/04/2018 Elsevier Patient Education  2021 Elsevier Inc.  

## 2020-09-27 ENCOUNTER — Encounter: Payer: Self-pay | Admitting: Medical-Surgical

## 2020-09-27 LAB — LIPID PANEL
Cholesterol: 189 mg/dL (ref <200)
HDL: 39 mg/dL — ABNORMAL LOW (ref >=50)
LDL Cholesterol (Calc): 119 mg/dL (calc) — ABNORMAL HIGH
Non-HDL Cholesterol (Calc): 150 mg/dL (calc) — ABNORMAL HIGH (ref <130)
Total CHOL/HDL Ratio: 4.8 (calc) (ref <5.0)
Triglycerides: 193 mg/dL — ABNORMAL HIGH (ref <150)

## 2020-09-27 LAB — CBC WITH DIFFERENTIAL/PLATELET
Absolute Monocytes: 450 cells/uL (ref 200–950)
Basophils Absolute: 60 cells/uL (ref 0–200)
Basophils Relative: 1.2 %
Eosinophils Absolute: 100 cells/uL (ref 15–500)
Eosinophils Relative: 2 %
HCT: 42.8 % (ref 35.0–45.0)
Hemoglobin: 14.3 g/dL (ref 11.7–15.5)
Lymphs Abs: 2150 cells/uL (ref 850–3900)
MCH: 30.1 pg (ref 27.0–33.0)
MCHC: 33.4 g/dL (ref 32.0–36.0)
MCV: 90.1 fL (ref 80.0–100.0)
MPV: 10.7 fL (ref 7.5–12.5)
Monocytes Relative: 9 %
Neutro Abs: 2240 cells/uL (ref 1500–7800)
Neutrophils Relative %: 44.8 %
Platelets: 405 10*3/uL — ABNORMAL HIGH (ref 140–400)
RBC: 4.75 10*6/uL (ref 3.80–5.10)
RDW: 14.5 % (ref 11.0–15.0)
Total Lymphocyte: 43 %
WBC: 5 10*3/uL (ref 3.8–10.8)

## 2020-09-27 LAB — IRON, TOTAL/TOTAL IRON BINDING CAP
%SAT: 21 % (calc) (ref 16–45)
Iron: 79 ug/dL (ref 45–160)
TIBC: 378 mcg/dL (calc) (ref 250–450)

## 2020-09-27 LAB — HEMOGLOBIN A1C
Hgb A1c MFr Bld: 5.5 % of total Hgb (ref <5.7)
Mean Plasma Glucose: 111 mg/dL
eAG (mmol/L): 6.2 mmol/L

## 2020-09-27 LAB — RETICULOCYTES
ABS Retic: 42750 cells/uL (ref 20000–8000)
Retic Ct Pct: 0.9 %

## 2020-09-27 LAB — FERRITIN: Ferritin: 78 ng/mL (ref 16–232)

## 2020-09-28 ENCOUNTER — Telehealth: Payer: Self-pay | Admitting: *Deleted

## 2020-09-28 NOTE — Telephone Encounter (Signed)
Per scheduling message 09/28/20 - called and gave upcoming appointments - mailed calendar

## 2020-10-05 ENCOUNTER — Other Ambulatory Visit (HOSPITAL_BASED_OUTPATIENT_CLINIC_OR_DEPARTMENT_OTHER): Payer: Self-pay

## 2020-10-05 MED FILL — Zolpidem Tartrate Tab 5 MG: ORAL | 30 days supply | Qty: 30 | Fill #0 | Status: AC

## 2020-10-09 ENCOUNTER — Other Ambulatory Visit: Payer: Self-pay | Admitting: Orthopedic Surgery

## 2020-10-16 ENCOUNTER — Other Ambulatory Visit (HOSPITAL_BASED_OUTPATIENT_CLINIC_OR_DEPARTMENT_OTHER): Payer: Self-pay

## 2020-10-16 MED FILL — Lisinopril & Hydrochlorothiazide Tab 10-12.5 MG: ORAL | 90 days supply | Qty: 90 | Fill #0 | Status: AC

## 2020-10-17 ENCOUNTER — Ambulatory Visit (INDEPENDENT_AMBULATORY_CARE_PROVIDER_SITE_OTHER): Payer: No Typology Code available for payment source | Admitting: Otolaryngology

## 2020-10-17 ENCOUNTER — Encounter (INDEPENDENT_AMBULATORY_CARE_PROVIDER_SITE_OTHER): Payer: Self-pay | Admitting: Otolaryngology

## 2020-10-17 ENCOUNTER — Other Ambulatory Visit: Payer: Self-pay

## 2020-10-17 VITALS — Temp 96.4°F

## 2020-10-17 DIAGNOSIS — H9311 Tinnitus, right ear: Secondary | ICD-10-CM | POA: Diagnosis not present

## 2020-10-17 DIAGNOSIS — H9041 Sensorineural hearing loss, unilateral, right ear, with unrestricted hearing on the contralateral side: Secondary | ICD-10-CM

## 2020-10-17 NOTE — Progress Notes (Signed)
HPI: Stacy Woods is a 55 y.o. female who presents is referred by her PCP for evaluation of right-sided tinnitus that she has noted for the past year.  She is not sure what caused the tinnitus but is gradually noted over the past year.  She has not noted any hearing problems.  She brings with her a hearing test that demonstrated normal hearing in both ears in the low and mid frequencies with an isolated right ear sensorineural hearing loss at approximate 4000 frequency where her tinnitus is occurring.  She denies any loud noise exposure or trauma to the right ear. On review of the audiogram this is consistent with a noise-induced type hearing loss in the right ear even though she denies any loud noise exposure..  Past Medical History:  Diagnosis Date  . AK (actinic keratosis) 07/14/2017   Right upper chest wall  . Anemia   . Anemia, iron deficiency 05/02/2015  . Fibromyalgia   . History of gastric bypass 05/09/2016  . Hypertension   . Insomnia 08/14/2017  . Malabsorption of iron 05/09/2016  . PONV (postoperative nausea and vomiting)   . Prediabetes 09/01/2013   March 2015   . Seizure ([place]) 2018   from low blood sugar   . Surgical menopause 03/24/2016   Past Surgical History:  Procedure Laterality Date  . ABDOMINAL HYSTERECTOMY  1997  . APPENDECTOMY    . CESAREAN SECTION  [id number]  . CHOLECYSTECTOMY    . GASTRIC BYPASS  2011  . KNEE ARTHROSCOPY  2002  . TONSILLECTOMY  1977  . ULNAR NERVE TRANSPOSITION Right 06/12/2015   Procedure: RIGHT CUBITAL TUNNEL RELEASE;  Surgeon: Milly Jakob, MD;  Location: [facility];  Service: Orthopedics;  Laterality: Right;   Social History   Socioeconomic History  . Marital status: Divorced    Spouse name: Not on file  . Number of children: 2  . Years of education: Assoc  . Highest education level: Not on file  Occupational History  . Occupation: [employer]  Tobacco Use  . Smoking status: Never Smoker  . Smokeless tobacco: Never Used   Vaping Use  . Vaping Use: Never used  Substance and Sexual Activity  . Alcohol use: No  . Drug use: No  . Sexual activity: Not Currently    Birth control/protection: Surgical    Comment: Hysterectomy  Other Topics Concern  . Not on file  Social History Narrative   Lives alone   Right-handed   Caffeine: <16 oz soda daily   Social Determinants of Health   Financial Resource Strain: Not on file  Food Insecurity: Not on file  Transportation Needs: Not on file  Physical Activity: Not on file  Stress: Not on file  Social Connections: Not on file   Family History  Problem Relation Age of Onset  . Diabetes Father   . Hypertension Father   . Stroke Father   . Heart failure Father   . CAD Father        has stents  . Breast cancer Mother   . Migraines Sister   . Colon cancer Neg Hx   . Colon polyps Neg Hx   . Esophageal cancer Neg Hx   . Stomach cancer Neg Hx   . Rectal cancer Neg Hx    Allergies  Allergen Reactions  . Mobic [Meloxicam] Swelling  . Naproxen Other (See Comments)    Swelling   . Nsaids Swelling    Swelling   . Prednisone Other (See Comments)  Swelling, blood sugar problems  . Shellfish Allergy Nausea Only and Swelling  . Lyrica [Pregabalin] Other (See Comments)    "zombie"  . Percocet [Oxycodone-Acetaminophen] Itching   Prior to Admission medications   Medication Sig Start Date End Date Taking? Authorizing Provider  blood glucose meter kit and supplies Dispense based on patient and insurance preference. Use up to four times daily as directed. (FOR ICD-10 E10.9, E11.9). 08/31/19   [name], DO  cyclobenzaprine (FLEXERIL) 10 MG tablet TAKE 1 TABLET BY MOUTH THREE TIMES DAILY AS NEEDED FOR MUSCLE SPASMS 09/26/20 09/26/21  Samuel Bouche, NP  escitalopram (LEXAPRO) 10 MG tablet TAKE 1 TABLET BY MOUTH ONCE DAILY 08/02/20 08/02/21  [name], DO  glucagon 1 MG injection Inject 1 mg into the muscle once as needed for up to 1 dose. For low blood sugar  11/12/18   Gregor Hams, MD  ketorolac (TORADOL) 10 MG tablet Take 1 tablet (10 mg total) by mouth every 8 (eight) hours as needed. 08/10/20   Silverio Decamp, MD  Lancets 30G MISC Check fasting blood sugar daily 02/06/17   Gregor Hams, MD  lisinopril-hydrochlorothiazide (ZESTORETIC) 10-12.5 MG tablet TAKE 1 TABLET BY MOUTH ONCE DAILY 07/20/20 07/20/21  Samuel Bouche, NP  zolpidem (AMBIEN) 5 MG tablet TAKE 1 TABLET (5 MG TOTAL) BY MOUTH AT BEDTIME AS NEEDED. FOR SLEEP 09/06/20 03/05/21  Samuel Bouche, NP  hydrochlorothiazide (HYDRODIURIL) 12.5 MG tablet Take 1 tablet (12.5 mg total) by mouth daily. 06/20/20 07/20/20  Samuel Bouche, NP  lisinopril (ZESTRIL) 10 MG tablet Take 1 tablet (10 mg total) by mouth daily. 04/03/20 07/20/20  Samuel Bouche, NP     Positive ROS: Otherwise negative  All other systems have been reviewed and were otherwise negative with the exception of those mentioned in the HPI and as above.  Physical Exam: Constitutional: Alert, well-appearing, no acute distress Ears: External ears without lesions or tenderness. Ear canals are clear bilaterally.  TMs are clear bilaterally with good mobility on pneumatic otoscopy. Nasal: External nose without lesions. Clear nasal passages Oral: Lips and gums without lesions. Tongue and palate mucosa without lesions. Posterior oropharynx clear. Neck: No palpable adenopathy or masses Respiratory: Breathing comfortably  Skin: No facial/neck lesions or rash noted.  Procedures  Assessment: Tinnitus in the right ear secondary to high-frequency sensorineural hearing loss.  Cause is unclear.  Plan: Reviewed with patient concerning limited treatment options for tinnitus.  Discussed with her concerning using masking noise when the tinnitus is bad.  Also gave her some samples of Lipo flavonoid which is a multivitamin that is beneficial in some people with tinnitus.  Cautioned her about using ear protection when around any loud noise. Also gave her  information on the [facility] "tinnitus clinic" that she could call if the tinnitus does not improve or worsens. Also recommended obtaining another hearing test in 1year. She will notify us if tinnitus worsens.   Radene Journey, MD   CC:

## 2020-10-29 ENCOUNTER — Other Ambulatory Visit (HOSPITAL_BASED_OUTPATIENT_CLINIC_OR_DEPARTMENT_OTHER): Payer: Self-pay

## 2020-10-29 MED FILL — Zolpidem Tartrate Tab 5 MG: ORAL | 30 days supply | Qty: 30 | Fill #1 | Status: AC

## 2020-11-07 ENCOUNTER — Other Ambulatory Visit (HOSPITAL_BASED_OUTPATIENT_CLINIC_OR_DEPARTMENT_OTHER): Payer: Self-pay

## 2020-11-07 MED FILL — Escitalopram Oxalate Tab 10 MG (Base Equiv): ORAL | 90 days supply | Qty: 90 | Fill #0 | Status: AC

## 2020-11-15 ENCOUNTER — Encounter (HOSPITAL_BASED_OUTPATIENT_CLINIC_OR_DEPARTMENT_OTHER): Payer: Self-pay | Admitting: Orthopedic Surgery

## 2020-11-15 ENCOUNTER — Other Ambulatory Visit: Payer: Self-pay

## 2020-11-19 ENCOUNTER — Encounter (HOSPITAL_BASED_OUTPATIENT_CLINIC_OR_DEPARTMENT_OTHER)
Admission: RE | Admit: 2020-11-19 | Discharge: 2020-11-19 | Disposition: A | Discharge status: Home / Self Care | Payer: No Typology Code available for payment source | Source: Ambulatory Visit | Attending: Orthopedic Surgery | Admitting: Orthopedic Surgery

## 2020-11-19 DIAGNOSIS — Z01812 Encounter for preprocedural laboratory examination: Secondary | ICD-10-CM | POA: Diagnosis present

## 2020-11-19 LAB — BASIC METABOLIC PANEL
Anion gap: 10 (ref 5–15)
BUN: 10 mg/dL (ref 6–20)
CO2: 22 mmol/L (ref 22–32)
Calcium: 9.2 mg/dL (ref 8.9–10.3)
Chloride: 106 mmol/L (ref 98–111)
Creatinine, Ser: 1.02 mg/dL — ABNORMAL HIGH (ref 0.44–1.00)
GFR, Estimated: >60 mL/min (ref >60)
Glucose, Bld: 91 mg/dL (ref 70–99)
Potassium: 3.5 mmol/L (ref 3.5–5.1)
Sodium: 138 mmol/L (ref 135–145)

## 2020-11-22 NOTE — Anesthesia Preprocedure Evaluation (Addendum)
Anesthesia Evaluation  Patient identified by MRN, date of birth, ID band Patient awake    Reviewed: Allergy & Precautions, NPO status , Patient's Chart, lab work & pertinent test results  History of Anesthesia Complications (+) PONV and history of anesthetic complications  Airway Mallampati: II  TM Distance: >3 FB Neck ROM: Full    Dental no notable dental hx. (+) Teeth Intact, Dental Advisory Given   Pulmonary neg pulmonary ROS,    Pulmonary exam normal breath sounds clear to auscultation       Cardiovascular hypertension, Pt. on medications Normal cardiovascular exam Rhythm:Regular Rate:Normal     Neuro/Psych Seizures -, Well Controlled,  PSYCHIATRIC DISORDERS Anxiety Depression  Neuromuscular disease    GI/Hepatic negative GI ROS, Neg liver ROS,   Endo/Other  diabetesMorbid obesity  Renal/GU negative Renal ROS  negative genitourinary   Musculoskeletal  (+) Arthritis , Osteoarthritis,  Fibromyalgia -  Abdominal   Peds negative pediatric ROS (+)  Hematology negative hematology ROS (+) Blood dyscrasia, anemia ,   Anesthesia Other Findings   Reproductive/Obstetrics negative OB ROS                            Anesthesia Physical  Anesthesia Plan  ASA: 2  Anesthesia Plan: General   Post-op Pain Management:    Induction: Intravenous  PONV Risk Score and Plan: 3 and Ondansetron, Dexamethasone, Midazolam and TIVA  Airway Management Planned: LMA and Oral ETT  Additional Equipment: None  Intra-op Plan:   Post-operative Plan: Extubation in OR  Informed Consent: I have reviewed the patients History and Physical, chart, labs and discussed the procedure including the risks, benefits and alternatives for the proposed anesthesia with the patient or authorized representative who has indicated his/her understanding and acceptance.     Dental advisory given  Plan Discussed with: CRNA,  Surgeon and Anesthesiologist  Anesthesia Plan Comments:        Anesthesia Quick Evaluation

## 2020-11-22 NOTE — H&P (Signed)
A pre op hand p   Chief Complaint: left knee pain  HPI: Stacy Woods is a 55 y.o. female who presents for evaluation of left knee pain. It has been present for ggreater than 6 months and has been worsening. Sshe has significant patellofemoral grinding as well as pain over the lateral joint line.She has failed conservative measures. Pain is rated as moderate.  Past Medical History:  Diagnosis Date   AK (actinic keratosis) 07/14/2017   Right upper chest wall   Anemia    Anemia, iron deficiency 05/02/2015   Fibromyalgia    History of gastric bypass 05/09/2016   Hypertension    Insomnia 08/14/2017   Malabsorption of iron 05/09/2016   PONV (postoperative nausea and vomiting)    Prediabetes 09/01/2013   March 2015    Seizure ([facility]) 2018   from low blood sugar    Surgical menopause 03/24/2016   Past Surgical History:  Procedure Laterality Date   ABDOMINAL HYSTERECTOMY  1997   APPENDECTOMY     CESAREAN SECTION  1991,1994   CHOLECYSTECTOMY     GASTRIC BYPASS  2011   KNEE ARTHROSCOPY  2002   TONSILLECTOMY  1977   ULNAR NERVE TRANSPOSITION Right 06/12/2015   Procedure: RIGHT CUBITAL TUNNEL RELEASE;  Surgeon: David Thompson, MD;  Location: [facility];  Service: Orthopedics;  Laterality: Right;   Social History   Socioeconomic History   Marital status: Divorced    Spouse name: Not on file   Number of children: 2   Years of education: Assoc   Highest education level: Not on file  Occupational History   Occupation: [employer]  Tobacco Use   Smoking status: Never   Smokeless tobacco: Never  Vaping Use   Vaping Use: Never used  Substance and Sexual Activity   Alcohol use: No   Drug use: No   Sexual activity: Not Currently    Birth control/protection: Surgical    Comment: Hysterectomy  Other Topics Concern   Not on file  Social History Narrative   Lives alone   Right-handed   Caffeine: <16 oz soda daily   Social Determinants of Health   Financial Resource  Strain: Not on file  Food Insecurity: Not on file  Transportation Needs: Not on file  Physical Activity: Not on file  Stress: Not on file  Social Connections: Not on file   Family History  Problem Relation Age of Onset   Diabetes Father    Hypertension Father    Stroke Father    Heart failure Father    CAD Father        has stents   Breast cancer Mother    Migraines Sister    Colon cancer Neg Hx    Colon polyps Neg Hx    Esophageal cancer Neg Hx    Stomach cancer Neg Hx    Rectal cancer Neg Hx    Allergies  Allergen Reactions   Mobic [Meloxicam] Swelling   Naproxen Other (See Comments)    Swelling    Nsaids Swelling    Swelling    Prednisone Other (See Comments)    Swelling, blood sugar problems   Shellfish Allergy Nausea Only and Swelling   Lyrica [Pregabalin] Other (See Comments)    "zombie"   Percocet [Oxycodone-Acetaminophen] Itching   Prior to Admission medications   Medication Sig Start Date End Date Taking? Authorizing Provider  escitalopram (LEXAPRO) 10 MG tablet TAKE 1 TABLET BY MOUTH ONCE DAILY 08/02/20 08/02/21 Yes Matthews, Cody, DO    lisinopril-hydrochlorothiazide (ZESTORETIC) 10-12.5 MG tablet TAKE 1 TABLET BY MOUTH ONCE DAILY 07/20/20 07/20/21 Yes Jessup, Joy, NP  zolpidem (AMBIEN) 5 MG tablet TAKE 1 TABLET (5 MG TOTAL) BY MOUTH AT BEDTIME AS NEEDED. FOR SLEEP 09/06/20 03/05/21 Yes Jessup, Joy, NP  blood glucose meter kit and supplies Dispense based on patient and insurance preference. Use up to four times daily as directed. (FOR ICD-10 E10.9, E11.9). 08/31/19   Matthews, Cody, DO  glucagon 1 MG injection Inject 1 mg into the muscle once as needed for up to 1 dose. For low blood sugar 11/12/18   Corey, Evan S, MD  Lancets 30G MISC Check fasting blood sugar daily 02/06/17   Corey, Evan S, MD  hydrochlorothiazide (HYDRODIURIL) 12.5 MG tablet Take 1 tablet (12.5 mg total) by mouth daily. 06/20/20 07/20/20  Jessup, Joy, NP  lisinopril (ZESTRIL) 10 MG tablet Take 1 tablet  (10 mg total) by mouth daily. 04/03/20 07/20/20  Jessup, Joy, NP     Positive ROS: none  All other systems have been reviewed and were otherwise negative with the exception of those mentioned in the HPI and as above.  Physical Exam: There were no vitals filed for this visit.  General: Alert, no acute distress Cardiovascular: No pedal edema Respiratory: No cyanosis, no use of accessory musculature GI: No organomegaly, abdomen is soft and non-tender Skin: No lesions in the area of chief complaint Neurologic: Sensation intact distally Psychiatric: Patient is competent for consent with normal mood and affect Lymphatic: No axillary or cervical lymphadenopathy  MUSCULOSKELETAL: left knee: Painful range of motion.  Positive inhibition and grind.  Tender to palpation of the lateral joint line.  Fullness over the lateral joint line.  MRI: MRI shows a lateral meniscal cyst or Para meniscal cyst over the lateral joint line.  She has significantchondromalacia of the patella.  She has questionable lateral meniscal tear.  Assessment/Plan: LEFT KNEE CYST AND CHONDROMALACIA PATELLA Plan for Procedure(s): ARTHROSCOPY KNEE WITH LATERAL RETINACULAR RELEASE AND OPEN CYST EXCISION  The risks benefits and alternatives were discussed with the patient including but not limited to the risks of nonoperative treatment, versus surgical intervention including infection, bleeding, nerve injury, malunion, nonunion, hardware prominence, hardware failure, need for hardware removal, blood clots, cardiopulmonary complications, morbidity, mortality, among others, and they were willing to proceed.  Predicted outcome is good, although there will be at least a six to nine month expected recovery.  John L Graves, MD 11/22/2020 10:06 PM   

## 2020-11-23 ENCOUNTER — Other Ambulatory Visit: Payer: Self-pay

## 2020-11-23 ENCOUNTER — Ambulatory Visit (HOSPITAL_BASED_OUTPATIENT_CLINIC_OR_DEPARTMENT_OTHER): Payer: No Typology Code available for payment source | Admitting: Anesthesiology

## 2020-11-23 ENCOUNTER — Encounter (HOSPITAL_BASED_OUTPATIENT_CLINIC_OR_DEPARTMENT_OTHER): Payer: Self-pay | Admitting: Orthopedic Surgery

## 2020-11-23 ENCOUNTER — Encounter (HOSPITAL_BASED_OUTPATIENT_CLINIC_OR_DEPARTMENT_OTHER)
Admission: RE | Disposition: A | Discharge status: Home / Self Care | Payer: Self-pay | Source: Ambulatory Visit | Attending: Orthopedic Surgery

## 2020-11-23 ENCOUNTER — Ambulatory Visit (HOSPITAL_BASED_OUTPATIENT_CLINIC_OR_DEPARTMENT_OTHER)
Admission: RE | Admit: 2020-11-23 | Discharge: 2020-11-23 | Disposition: A | Discharge status: Home / Self Care | Payer: No Typology Code available for payment source | Source: Ambulatory Visit | Attending: Orthopedic Surgery | Admitting: Orthopedic Surgery

## 2020-11-23 DIAGNOSIS — M1712 Unilateral primary osteoarthritis, left knee: Secondary | ICD-10-CM | POA: Diagnosis present

## 2020-11-23 DIAGNOSIS — M25862 Other specified joint disorders, left knee: Secondary | ICD-10-CM | POA: Insufficient documentation

## 2020-11-23 DIAGNOSIS — Z79899 Other long term (current) drug therapy: Secondary | ICD-10-CM | POA: Insufficient documentation

## 2020-11-23 DIAGNOSIS — Z886 Allergy status to analgesic agent status: Secondary | ICD-10-CM | POA: Insufficient documentation

## 2020-11-23 DIAGNOSIS — Z91013 Allergy to seafood: Secondary | ICD-10-CM | POA: Diagnosis not present

## 2020-11-23 DIAGNOSIS — M6752 Plica syndrome, left knee: Secondary | ICD-10-CM | POA: Diagnosis not present

## 2020-11-23 DIAGNOSIS — X58XXXA Exposure to other specified factors, initial encounter: Secondary | ICD-10-CM | POA: Diagnosis not present

## 2020-11-23 DIAGNOSIS — Z9884 Bariatric surgery status: Secondary | ICD-10-CM | POA: Insufficient documentation

## 2020-11-23 DIAGNOSIS — Z885 Allergy status to narcotic agent status: Secondary | ICD-10-CM | POA: Insufficient documentation

## 2020-11-23 DIAGNOSIS — S83282A Other tear of lateral meniscus, current injury, left knee, initial encounter: Secondary | ICD-10-CM | POA: Diagnosis not present

## 2020-11-23 DIAGNOSIS — Z888 Allergy status to other drugs, medicaments and biological substances status: Secondary | ICD-10-CM | POA: Diagnosis not present

## 2020-11-23 DIAGNOSIS — M2242 Chondromalacia patellae, left knee: Secondary | ICD-10-CM | POA: Diagnosis not present

## 2020-11-23 DIAGNOSIS — M94262 Chondromalacia, left knee: Secondary | ICD-10-CM | POA: Insufficient documentation

## 2020-11-23 DIAGNOSIS — M23001 Cystic meniscus, unspecified lateral meniscus, left knee: Secondary | ICD-10-CM | POA: Diagnosis present

## 2020-11-23 HISTORY — PX: KNEE ARTHROSCOPY WITH LATERAL RELEASE: SHX5649

## 2020-11-23 HISTORY — PX: KNEE ARTHROSCOPY WITH EXCISION BAKER'S CYST: SHX5646

## 2020-11-23 HISTORY — PX: CHONDROPLASTY: SHX5177

## 2020-11-23 LAB — GLUCOSE, CAPILLARY
Glucose-Capillary: 86 mg/dL (ref 70–99)
Glucose-Capillary: 100 mg/dL — ABNORMAL HIGH (ref 70–99)

## 2020-11-23 SURGERY — ARTHROSCOPY, KNEE, WITH LATERAL RETINACULUM RELEASE
Anesthesia: General | Site: Knee | Laterality: Left

## 2020-11-23 MED ORDER — MIDAZOLAM HCL 2 MG/2ML IJ SOLN
INTRAMUSCULAR | Status: AC
  Filled 2020-11-23: qty 2

## 2020-11-23 MED ORDER — BUPIVACAINE-EPINEPHRINE (PF) 0.5% -1:200000 IJ SOLN
INTRAMUSCULAR | Status: DC | PRN
  Administered 2020-11-23: 20 mL

## 2020-11-23 MED ORDER — LACTATED RINGERS IV SOLN: INTRAVENOUS | Status: DC

## 2020-11-23 MED ORDER — PROPOFOL 500 MG/50ML IV EMUL
INTRAVENOUS | Status: DC | PRN
  Administered 2020-11-23: 200 ug/kg/min via INTRAVENOUS

## 2020-11-23 MED ORDER — DEXAMETHASONE SODIUM PHOSPHATE 10 MG/ML IJ SOLN
INTRAMUSCULAR | Status: AC
  Filled 2020-11-23: qty 1

## 2020-11-23 MED ORDER — SCOPOLAMINE 1 MG/3DAYS TD PT72
MEDICATED_PATCH | TRANSDERMAL | Status: AC
  Filled 2020-11-23: qty 1

## 2020-11-23 MED ORDER — FENTANYL CITRATE (PF) 100 MCG/2ML IJ SOLN
INTRAMUSCULAR | Status: AC
  Filled 2020-11-23: qty 2

## 2020-11-23 MED ORDER — CEFAZOLIN SODIUM-DEXTROSE 2-4 GM/100ML-% IV SOLN
2.0000 g | INTRAVENOUS | Status: AC
  Administered 2020-11-23: 2 g via INTRAVENOUS

## 2020-11-23 MED ORDER — MEPERIDINE HCL 25 MG/ML IJ SOLN: 6.2500 mg | INTRAMUSCULAR | Status: DC | PRN

## 2020-11-23 MED ORDER — PROPOFOL 10 MG/ML IV BOLUS
INTRAVENOUS | Status: DC | PRN
  Administered 2020-11-23: 150 mg via INTRAVENOUS

## 2020-11-23 MED ORDER — PROPOFOL 500 MG/50ML IV EMUL
INTRAVENOUS | Status: AC
  Filled 2020-11-23: qty 50

## 2020-11-23 MED ORDER — ACETAMINOPHEN 160 MG/5ML PO SOLN: 325.0000 mg | ORAL | Status: DC | PRN

## 2020-11-23 MED ORDER — DEXAMETHASONE SODIUM PHOSPHATE 4 MG/ML IJ SOLN
INTRAMUSCULAR | Status: DC | PRN
  Administered 2020-11-23: 4 mg via INTRAVENOUS

## 2020-11-23 MED ORDER — FENTANYL CITRATE (PF) 100 MCG/2ML IJ SOLN
25.0000 ug | INTRAMUSCULAR | Status: DC | PRN
  Administered 2020-11-23 (×2): 50 ug via INTRAVENOUS

## 2020-11-23 MED ORDER — ACETAMINOPHEN 325 MG PO TABS: 325.0000 mg | ORAL_TABLET | ORAL | Status: DC | PRN

## 2020-11-23 MED ORDER — LIDOCAINE HCL (PF) 2 % IJ SOLN
INTRAMUSCULAR | Status: AC
  Filled 2020-11-23: qty 5

## 2020-11-23 MED ORDER — ONDANSETRON HCL 4 MG/2ML IJ SOLN
INTRAMUSCULAR | Status: AC
  Filled 2020-11-23: qty 2

## 2020-11-23 MED ORDER — OXYCODONE HCL 5 MG PO TABS
ORAL_TABLET | ORAL | Status: AC
  Filled 2020-11-23: qty 1

## 2020-11-23 MED ORDER — OXYCODONE HCL 5 MG/5ML PO SOLN: 5.0000 mg | Freq: Once | ORAL | Status: AC | PRN

## 2020-11-23 MED ORDER — HYDROCODONE-ACETAMINOPHEN 5-325 MG PO TABS: 1.0000 | ORAL_TABLET | Freq: Four times a day (QID) | ORAL | 0 refills | Status: DC | PRN

## 2020-11-23 MED ORDER — CEFAZOLIN SODIUM-DEXTROSE 2-4 GM/100ML-% IV SOLN
INTRAVENOUS | Status: AC
  Filled 2020-11-23: qty 100

## 2020-11-23 MED ORDER — LIDOCAINE 2% (20 MG/ML) 5 ML SYRINGE
INTRAMUSCULAR | Status: DC | PRN
  Administered 2020-11-23: 80 mg via INTRAVENOUS

## 2020-11-23 MED ORDER — MIDAZOLAM HCL 2 MG/2ML IJ SOLN
2.0000 mg | Freq: Once | INTRAMUSCULAR | Status: AC
  Administered 2020-11-23: 2 mg via INTRAVENOUS

## 2020-11-23 MED ORDER — ONDANSETRON HCL 4 MG/2ML IJ SOLN: 4.0000 mg | Freq: Once | INTRAMUSCULAR | Status: DC | PRN

## 2020-11-23 MED ORDER — SODIUM CHLORIDE 0.9 % IR SOLN
Status: DC | PRN
  Administered 2020-11-23: 6000 mL

## 2020-11-23 MED ORDER — ONDANSETRON HCL 4 MG/2ML IJ SOLN
INTRAMUSCULAR | Status: DC | PRN
  Administered 2020-11-23: 4 mg via INTRAVENOUS

## 2020-11-23 MED ORDER — FENTANYL CITRATE (PF) 100 MCG/2ML IJ SOLN
100.0000 ug | Freq: Once | INTRAMUSCULAR | Status: AC
  Administered 2020-11-23: 100 ug via INTRAVENOUS

## 2020-11-23 MED ORDER — DEXMEDETOMIDINE (PRECEDEX) IN NS 20 MCG/5ML (4 MCG/ML) IV SYRINGE
PREFILLED_SYRINGE | INTRAVENOUS | Status: AC
  Filled 2020-11-23: qty 5

## 2020-11-23 MED ORDER — PROPOFOL 10 MG/ML IV BOLUS
INTRAVENOUS | Status: AC
  Filled 2020-11-23: qty 20

## 2020-11-23 MED ORDER — OXYCODONE HCL 5 MG PO TABS
5.0000 mg | ORAL_TABLET | Freq: Once | ORAL | Status: AC | PRN
  Administered 2020-11-23: 5 mg via ORAL

## 2020-11-23 MED ORDER — FENTANYL CITRATE (PF) 100 MCG/2ML IJ SOLN
INTRAMUSCULAR | Status: DC | PRN
  Administered 2020-11-23 (×4): 25 ug via INTRAVENOUS

## 2020-11-23 MED ORDER — SCOPOLAMINE 1 MG/3DAYS TD PT72
1.0000 | MEDICATED_PATCH | TRANSDERMAL | Status: DC
  Administered 2020-11-23: 1.5 mg via TRANSDERMAL

## 2020-11-23 MED ORDER — DEXMEDETOMIDINE (PRECEDEX) IN NS 20 MCG/5ML (4 MCG/ML) IV SYRINGE
PREFILLED_SYRINGE | INTRAVENOUS | Status: DC | PRN
  Administered 2020-11-23 (×5): 4 ug via INTRAVENOUS

## 2020-11-23 SURGICAL SUPPLY — 34 items
BLADE EXCALIBUR 4.0X13 (MISCELLANEOUS) ×2 IMPLANT
BNDG ELASTIC 6X5.8 VLCR STR LF (GAUZE/BANDAGES/DRESSINGS) ×2 IMPLANT
BURR OVAL 8 FLU 4.0X13 (MISCELLANEOUS) ×2 IMPLANT
COVER WAND RF STERILE (DRAPES) IMPLANT
DISSECTOR 4.0MM X 13CM (MISCELLANEOUS) IMPLANT
DRAPE ARTHROSCOPY W/POUCH 90 (DRAPES) ×2 IMPLANT
DRSG EMULSION OIL 3X3 NADH (GAUZE/BANDAGES/DRESSINGS) ×2 IMPLANT
DURAPREP 26ML APPLICATOR (WOUND CARE) ×2 IMPLANT
ELECT MENISCUS 165MM 90D (ELECTRODE) IMPLANT
ELECT REM PT RETURN 9FT ADLT (ELECTROSURGICAL)
ELECTRODE REM PT RTRN 9FT ADLT (ELECTROSURGICAL) IMPLANT
GAUZE SPONGE 4X4 12PLY STRL (GAUZE/BANDAGES/DRESSINGS) ×2 IMPLANT
GLOVE SRG 8 PF TXTR STRL LF DI (GLOVE) ×2 IMPLANT
GLOVE SURG LTX SZ7.5 (GLOVE) ×4 IMPLANT
GLOVE SURG UNDER POLY LF SZ8 (GLOVE) ×4
GOWN STRL REUS W/ TWL LRG LVL3 (GOWN DISPOSABLE) ×1 IMPLANT
GOWN STRL REUS W/ TWL XL LVL3 (GOWN DISPOSABLE) ×1 IMPLANT
GOWN STRL REUS W/TWL LRG LVL3 (GOWN DISPOSABLE) ×2
GOWN STRL REUS W/TWL XL LVL3 (GOWN DISPOSABLE) ×2
HOLDER KNEE FOAM BLUE (MISCELLANEOUS) ×2 IMPLANT
MANIFOLD NEPTUNE II (INSTRUMENTS) IMPLANT
NDL SAFETY ECLIPSE 18X1.5 (NEEDLE) IMPLANT
NEEDLE HYPO 18GX1.5 SHARP (NEEDLE)
PACK ARTHROSCOPY DSU (CUSTOM PROCEDURE TRAY) ×2 IMPLANT
PACK BASIN DAY SURGERY FS (CUSTOM PROCEDURE TRAY) ×2 IMPLANT
PAD CAST 4YDX4 CTTN HI CHSV (CAST SUPPLIES) ×1 IMPLANT
PADDING CAST COTTON 4X4 STRL (CAST SUPPLIES) ×2
PENCIL SMOKE EVACUATOR (MISCELLANEOUS) IMPLANT
SUT ETHILON 4 0 PS 2 18 (SUTURE) IMPLANT
SYR 5ML LL (SYRINGE) ×2 IMPLANT
TOWEL GREEN STERILE FF (TOWEL DISPOSABLE) ×2 IMPLANT
TUBING ARTHROSCOPY IRRIG 16FT (MISCELLANEOUS) ×2 IMPLANT
WATER STERILE IRR 1000ML POUR (IV SOLUTION) ×2 IMPLANT
WRAP KNEE MAXI GEL POST OP (GAUZE/BANDAGES/DRESSINGS) ×2 IMPLANT

## 2020-11-23 NOTE — Anesthesia Procedure Notes (Signed)
Anesthesia Regional Block: Knee block   Pre-Anesthetic Checklist: , timeout performed,  Correct Patient, Correct Site, Correct Laterality,  Correct Procedure, Correct Position, site marked,  Risks and benefits discussed,  Surgical consent,  Pre-op evaluation,  At surgeon's request and post-op pain management  Laterality: Left  Prep: chloraprep       Needles:  Injection technique: Single-shot  Needle Type: Other     Needle Length: 5cm  Needle Gauge: 22     Additional Needles:   Narrative:  Start time: 11/23/2020 7:20 AM End time: 11/23/2020 7:23 AM Injection made incrementally with aspirations every 5 mL.  Performed by: Personally  Anesthesiologist: Janeece Riggers, MD  Additional Notes: Functioning IV was confirmed and monitors were applied.   Sterile prep and drape,hand hygiene and sterile gloves were used. UNegative aspiration and negative test dose prior to incremental administration of local anesthetic. The patient tolerated the procedure well.   NO PIC

## 2020-11-23 NOTE — Brief Op Note (Signed)
11/23/2020  8:41 AM  PATIENT:  Stacy Woods  55 y.o. female  PRE-OPERATIVE DIAGNOSIS:  LEFT KNEE CYST AND CHONDROMALACIA PATELLA  POST-OPERATIVE DIAGNOSIS:  LEFT KNEE CYST AND CHONDROMALACIA PATELLA  PROCEDURE:  Procedure(s): ARTHROSCOPY KNEE WITH LATERAL RETINACULAR RELEASE, PARTIAL LATERAL MENISCECTOMY (Left) KNEE ARTHROSCOPY WITH ARTHROSCOPIC CYST EXCISION (Left) CHONDROPLASTY MEDIAL FEMORAL PATELLA (Left)  SURGEON:  Surgeon(s) and Role:    Dorna Leitz, MD - Primary  PHYSICIAN ASSISTANT:   ASSISTANTS: jim bethune   ANESTHESIA:   none  EBL:  5 mL   BLOOD ADMINISTERED:none  DRAINS: none   LOCAL MEDICATIONS USED:  MARCAINE     SPECIMEN:  No Specimen  DISPOSITION OF SPECIMEN:  N/A  COUNTS:  YES  TOURNIQUET:  * No tourniquets in log *  DICTATION: .Other Dictation: Dictation Number [id number]  PLAN OF CARE: Discharge to home after PACU  PATIENT DISPOSITION:  PACU - hemodynamically stable.   Delay start of Pharmacological VTE agent (>24hrs) due to surgical blood loss or risk of bleeding: no

## 2020-11-23 NOTE — Interval H&P Note (Signed)
History and Physical Interval Note:  11/23/2020 7:27 AM  Stacy Woods  has presented today for surgery, with the diagnosis of LEFT KNEE CYST AND CHONDROMALACIA PATELLA.  The various methods of treatment have been discussed with the patient and family. After consideration of risks, benefits and other options for treatment, the patient has consented to  Procedure(s): ARTHROSCOPY KNEE WITH LATERAL RETINACULAR RELEASE AND OPEN CYST EXCISION (Left) as a surgical intervention.  The patient's history has been reviewed, patient examined, no change in status, stable for surgery.  I have reviewed the patient's chart and labs.  Questions were answered to the patient's satisfaction.     [name]

## 2020-11-23 NOTE — Progress Notes (Signed)
Assisted Dr. Ambrose Pancoast with left, knee block. Side rails up, monitors on throughout procedure. See vital signs in flow sheet. Tolerated Procedure well.

## 2020-11-23 NOTE — Transfer of Care (Signed)
Immediate Anesthesia Transfer of Care Note  Patient: Stacy Woods  Procedure(s) Performed: ARTHROSCOPY KNEE WITH LATERAL RETINACULAR RELEASE (Left: Knee)  Patient Location: PACU  Anesthesia Type:General and Regional  Level of Consciousness: drowsy  Airway & Oxygen Therapy: Patient Spontanous Breathing and Patient connected to face mask oxygen  Post-op Assessment: Report given to RN and Post -op Vital signs reviewed and stable  Post vital signs: Reviewed and stable  Last Vitals:  Vitals Value Taken Time  BP 104/76 11/23/20 0831  Temp    Pulse 76 11/23/20 0832  Resp 16 11/23/20 0832  SpO2 99 % 11/23/20 0832  Vitals shown include unvalidated device data.  Last Pain:  Vitals:   11/23/20 0646  TempSrc: Oral  PainSc: 5          Complications: No notable events documented.

## 2020-11-23 NOTE — Anesthesia Procedure Notes (Signed)
Procedure Name: LMA Insertion Date/Time: 11/23/2020 7:37 AM Performed by: Ezequiel Kayser, CRNA Pre-anesthesia Checklist: Patient identified, Emergency Drugs available, Suction available and Patient being monitored Patient Re-evaluated:Patient Re-evaluated prior to induction Oxygen Delivery Method: Circle System Utilized Preoxygenation: Pre-oxygenation with 100% oxygen Induction Type: IV induction Ventilation: Mask ventilation without difficulty LMA: LMA inserted LMA Size: 4.0 Number of attempts: 1 Airway Equipment and Method: Bite block Placement Confirmation: positive ETCO2 Tube secured with: Tape Dental Injury: Teeth and Oropharynx as per pre-operative assessment

## 2020-11-23 NOTE — Op Note (Signed)
Stacy Woods, OQUENDO MEDICAL RECORD NO: [id number] ACCOUNT NO: [id number] DATE OF BIRTH: 20-Jan-1966 FACILITY: [facility] LOCATION: [facility] PHYSICIAN: [name], MD  Operative Report   DATE OF PROCEDURE: 11/23/2020  She is a 55 year old female in the orthopedic surgery service.  PREOPERATIVE DIAGNOSES: 1.  Lateral meniscal tear. 2.  Chondromalacia patella with tight lateral retinaculum. 3.  Cystic mass, lateral aspect of the tibial plateau.  POSTOPERATIVE DIAGNOSES: 1.  Lateral meniscal tear. 2.  Chondromalacia patella with tight lateral retinaculum. 3.  Cystic mass, lateral aspect of the tibial plateau. 4.  Chondromalacia of the medial femoral condyle. 5.  Medial plica.  PRINCIPAL PROCEDURE:  1.  Left knee arthroscopy with partial lateral meniscectomy. 2.  Chondroplasty, medial, patellofemoral, and lateral. 3.  Lateral retinacular release, arthroscopic. 4.  Debridement of cystic mass and bony exostosis of the proximal tibia, lateral tibial plateau.  SURGEON:  Dorna Leitz, MD  ASSISTANT:  Modena Slater.  ANESTHESIA:  General.  BRIEF HISTORY:  The patient a 55 year old female with a long history of significant complaints of left knee pain.  She had preoperative MRI which showed lateral meniscal tearing as well as cystic mass on the lateral side.  She had significant  chondromalacia of the patellofemoral joint with a tight lateral retinaculum.  She was having significant pain and after failure of all conservative care, was taken to the operating room for operative knee arthroscopy.  DESCRIPTION OF PROCEDURE:  The patient was taken to the operating room after adequate anesthesia was obtained with a general anesthetic, the patient was placed supine on the operating table.  Left leg was prepped and draped in the usual sterile fashion.   Following this, routine arthroscopic examination of the knee revealed there was extensive chondromalacia of the patellofemoral joint.  This was  debrided back to a smooth and stable rim.  Attention turned to the medial side where there was a large medial  plica, which was debrided back to the rim of the capsule.  We entered the medial compartment.  Medial compartment had extensive chondromalacia of the medial femoral condyle, which was debrided with a suction shaver back to a smooth and stable rim.   Medial meniscus was probed at length and normal.  ACL normal.  Attention turned to the lateral side where there was extensive lateral meniscal tearing right over the midportion of the meniscal body.  This was debrided with a suction shaver back to a  smooth and stable rim of meniscus.  At this point, it became clear that we could actually get right over the lateral side of the tibia, so I carefully dissected down over the lateral side of the tibia and we were able to get into the cyst, we saw a burst  of cystic fluid, so we took the shaver in that area and we were able to shave the cyst out.  I then took a bur out and we actually burred down this exostosis on the proximal tibia and this was able to be done through the arthroscopic portal.  Once I  burred down the tibia here and we were certain that we had debrided the cystic mass out, I felt that we had taken care of that problem.  We did need to make an incision laterally to address that. Attention at this time was turned to the lateral  retinaculum, which was obviously and significantly tight.  We released the lateral retinaculum arthroscopically with a Bovie from 3 fingerbreadths proximal to the patella down to the joint  line.  Patellar tracking was much more midline at that point and  there was no hyperpressure on the patellofemoral joint at that point.  At this time, the wounds were irrigated and suctioned dry.  The arthroscopic portals were closed with a bandage.  A sterile compressive dressing was applied.  The patient was taken to  recovery and was noted to be in satisfactory condition.   Estimated blood loss for procedure was minimal.   SHW D: 11/23/2020 8:47:27 am T: 11/23/2020 10:04:00 am  JOB: [id number]/ [id number]

## 2020-11-23 NOTE — Discharge Instructions (Addendum)
Ambulate weightbearing as tolerated on the left leg.  Use crutches as needed. Work on left knee straight leg raises and range of motion.  You can bend your knee as much as tolerated. Change the dressing in 2 days.  You can then apply a waterproof Band-Aid over your two small incisions and take a shower.  Do not soak in a tub. Use your pain medicine as tolerated.  You can also use ibuprofen or Aleve as needed.  Your pain medication was sent into your pharmacy in [place].  POST-OP KNEE ARTHROSCOPY INSTRUCTIONS  Dr. Alain Marion PA-C  Pain You will be expected to have a moderate amount of pain in the affected knee for approximately two weeks. However, the first two days will be the most severe pain. A prescription has been provided to take as needed for the pain. The pain can be reduced by applying ice packs to the knee for the first 1-2 weeks post surgery. Also, keeping the leg elevated on pillows will help alleviate the pain. If you develop any acute pain or swelling in your calf muscle, please call the doctor.  Activity It is preferred that you stay at bed rest for approximately 24 hours. However, you may go to the bathroom with help. Weight bearing as tolerated. You may begin the knee exercises the day of surgery. Discontinue crutches as the knee pain resolves.  Dressing Keep the dressing dry. If the ace bandage should wrinkle or roll up, this can be rewrapped to prevent ridges in the bandage. You may remove all dressings in 48 hours,  apply bandaids to each wound. You may shower on the 4th day after surgery but no tub bath.  Symptoms to report to your doctor Extreme pain Extreme swelling Temperature above 101 degrees Change in the feeling, color, or movement of your toes Redness, heat, or swelling at your incision  Exercise If is preferred that as soon as possible you try to do a straight leg raise without bending the knee and concentrate on bringing the heel of your foot off  the bed up to approximately 45 degrees and hold for the count of 10 seconds. Repeat this at least 10 times three or four times per day. Additional exercises are provided below.  You are encouraged to bend the knee as tolerated.  Follow-Up Call to schedule a follow-up appointment in 5-7 days. Our office # is [phone].  POST-OP EXERCISES  Short Arc Quads  1. Lie on back with legs straight. Place towel roll under thigh, just above knee. 2. Tighten thigh muscles to straighten knee and lift heel off bed. 3. Hold for slow count of five, then lower. 4. Do three sets of ten    Straight Leg Raises  1. Lie on back with operative leg straight and other leg bent. 2. Keeping operative leg completely straight, slowly lift operative leg so foot is 5 inches off bed. 3. Hold for slow count of five, then lower. 4. Do three sets of ten.    DO BOTH EXERCISES 2 TIMES A DAY  Ankle Pumps  Work/move the operative ankle and foot up and down 10 times every hour while awake.    Post Anesthesia Home Care Instructions  Activity: Get plenty of rest for the remainder of the day. A responsible individual must stay with you for 24 hours following the procedure.  For the next 24 hours, DO NOT: -Drive a car -Paediatric nurse -Drink alcoholic beverages -Take any medication unless instructed by your physician -  Make any legal decisions or sign important papers.  Meals: Start with liquid foods such as gelatin or soup. Progress to regular foods as tolerated. Avoid greasy, spicy, heavy foods. If nausea and/or vomiting occur, drink only clear liquids until the nausea and/or vomiting subsides. Call your physician if vomiting continues.  Special Instructions/Symptoms: Your throat may feel dry or sore from the anesthesia or the breathing tube placed in your throat during surgery. If this causes discomfort, gargle with warm salt water. The discomfort should disappear within 24 hours.  If you had a scopolamine  patch placed behind your ear for the management of post- operative nausea and/or vomiting:  1. The medication in the patch is effective for 72 hours, after which it should be removed.  Wrap patch in a tissue and discard in the trash. Wash hands thoroughly with soap and water. 2. You may remove the patch earlier than 72 hours if you experience unpleasant side effects which may include dry mouth, dizziness or visual disturbances. 3. Avoid touching the patch. Wash your hands with soap and water after contact with the patch.    Regional Anesthesia Blocks  1. Numbness or the inability to move the "blocked" extremity may last from 3-48 hours after placement. The length of time depends on the medication injected and your individual response to the medication. If the numbness is not going away after 48 hours, call your surgeon.  2. The extremity that is blocked will need to be protected until the numbness is gone and the  Strength has returned. Because you cannot feel it, you will need to take extra care to avoid injury. Because it may be weak, you may have difficulty moving it or using it. You may not know what position it is in without looking at it while the block is in effect.  3. For blocks in the legs and feet, returning to weight bearing and walking needs to be done carefully. You will need to wait until the numbness is entirely gone and the strength has returned. You should be able to move your leg and foot normally before you try and bear weight or walk. You will need someone to be with you when you first try to ensure you do not fall and possibly risk injury.  4. Bruising and tenderness at the needle site are common side effects and will resolve in a few days.  5. Persistent numbness or new problems with movement should be communicated to the surgeon or the [place] [phone] [place] [phone]).

## 2020-11-23 NOTE — Anesthesia Postprocedure Evaluation (Signed)
Anesthesia Post Note  Patient: Stacy Woods  Procedure(s) Performed: ARTHROSCOPY KNEE WITH LATERAL RETINACULAR RELEASE, PARTIAL LATERAL MENISCECTOMY (Left: Knee) KNEE ARTHROSCOPY WITH ARTHROSCOPIC CYST EXCISION (Left: Knee) CHONDROPLASTY MEDIAL FEMORAL PATELLA (Left: Knee)     Patient location during evaluation: PACU Anesthesia Type: General Level of consciousness: awake and alert Pain management: pain level controlled Vital Signs Assessment: post-procedure vital signs reviewed and stable Respiratory status: spontaneous breathing, nonlabored ventilation, respiratory function stable and patient connected to nasal cannula oxygen Cardiovascular status: blood pressure returned to baseline and stable Postop Assessment: no apparent nausea or vomiting Anesthetic complications: no   No notable events documented.  Last Vitals:  Vitals:   11/23/20 0916 11/23/20 0942  BP: 110/81 113/83  Pulse: 73 74  Resp: 12 15  Temp:  36.6 C  SpO2: 95% 97%    Last Pain:  Vitals:   11/23/20 0942  TempSrc:   PainSc: 3                  Enas Winchel

## 2020-11-25 ENCOUNTER — Encounter (HOSPITAL_BASED_OUTPATIENT_CLINIC_OR_DEPARTMENT_OTHER): Payer: Self-pay | Admitting: Orthopedic Surgery

## 2020-11-28 MED FILL — Zolpidem Tartrate Tab 5 MG: ORAL | 30 days supply | Qty: 30 | Fill #2 | Status: AC

## 2020-11-29 ENCOUNTER — Other Ambulatory Visit (HOSPITAL_BASED_OUTPATIENT_CLINIC_OR_DEPARTMENT_OTHER): Payer: Self-pay

## 2020-12-11 ENCOUNTER — Encounter: Payer: Self-pay | Admitting: Medical-Surgical

## 2020-12-11 ENCOUNTER — Ambulatory Visit (INDEPENDENT_AMBULATORY_CARE_PROVIDER_SITE_OTHER): Payer: No Typology Code available for payment source | Admitting: Medical-Surgical

## 2020-12-11 ENCOUNTER — Other Ambulatory Visit: Payer: Self-pay

## 2020-12-11 VITALS — BP 102/70 | HR 84 | Temp 99.1°F | Ht 64.0 in | Wt 207.0 lb

## 2020-12-11 DIAGNOSIS — L989 Disorder of the skin and subcutaneous tissue, unspecified: Secondary | ICD-10-CM | POA: Diagnosis not present

## 2020-12-11 NOTE — Progress Notes (Signed)
Subjective:    CC: Scalp lesion  HPI: Pleasant 55 year old female presenting today for evaluation of a lesion on the right side of her scalp at the top of her head.  Notes the lesion has been there for approximately 2 months now and has become very irritated and at times tender.  She did have a sebaceous cyst removed in that area about 5 years ago and thinks may be a stitch never dissolved at the site which is causing the lesion.  She has had no drainage from it and cannot note a significant change in size.  I reviewed the past medical history, family history, social history, surgical history, and allergies today and no changes were needed.  Please see the problem list section below in epic for further details.  Past Medical History: Past Medical History:  Diagnosis Date   AK (actinic keratosis) 07/14/2017   Right upper chest wall   Anemia    Anemia, iron deficiency 05/02/2015   Fibromyalgia    History of gastric bypass 05/09/2016   Hypertension    Insomnia 08/14/2017   Malabsorption of iron 05/09/2016   PONV (postoperative nausea and vomiting)    Prediabetes 09/01/2013   March 2015    Seizure ([place]) 2018   from low blood sugar    Surgical menopause 03/24/2016   Past Surgical History: Past Surgical History:  Procedure Laterality Date   [place]  [id number]   CHOLECYSTECTOMY     CHONDROPLASTY Left 11/23/2020   Procedure: CHONDROPLASTY MEDIAL FEMORAL PATELLA;  Surgeon: Dorna Leitz, MD;  Location: [facility];  Service: Orthopedics;  Laterality: Left;   GASTRIC BYPASS  2011   KNEE ARTHROSCOPY  2002   KNEE ARTHROSCOPY WITH EXCISION BAKER'S CYST Left 11/23/2020   Procedure: KNEE ARTHROSCOPY WITH ARTHROSCOPIC CYST EXCISION;  Surgeon: Dorna Leitz, MD;  Location: [facility];  Service: Orthopedics;  Laterality: Left;   KNEE ARTHROSCOPY WITH LATERAL RELEASE Left 11/23/2020   Procedure: ARTHROSCOPY KNEE  WITH LATERAL RETINACULAR RELEASE, PARTIAL LATERAL MENISCECTOMY;  Surgeon: Dorna Leitz, MD;  Location: [facility];  Service: Orthopedics;  Laterality: Left;   TONSILLECTOMY  1977   ULNAR NERVE TRANSPOSITION Right 06/12/2015   Procedure: RIGHT CUBITAL TUNNEL RELEASE;  Surgeon: Milly Jakob, MD;  Location: [facility];  Service: Orthopedics;  Laterality: Right;   Social History: Social History   Socioeconomic History   Marital status: Divorced    Spouse name: Not on file   Number of children: 2   Years of education: Assoc   Highest education level: Not on file  Occupational History   Occupation: [employer]  Tobacco Use   Smoking status: Never   Smokeless tobacco: Never  Vaping Use   Vaping Use: Never used  Substance and Sexual Activity   Alcohol use: No   Drug use: No   Sexual activity: Not Currently    Birth control/protection: Surgical    Comment: Hysterectomy  Other Topics Concern   Not on file  Social History Narrative   Lives alone   Right-handed   Caffeine: <16 oz soda daily   Social Determinants of Health   Financial Resource Strain: Not on file  Food Insecurity: Not on file  Transportation Needs: Not on file  Physical Activity: Not on file  Stress: Not on file  Social Connections: Not on file   Family History: Family History  Problem Relation Age of Onset   Diabetes  Father    Hypertension Father    Stroke Father    Heart failure Father    CAD Father        has stents   Breast cancer Mother    Migraines Sister    Colon cancer Neg Hx    Colon polyps Neg Hx    Esophageal cancer Neg Hx    Stomach cancer Neg Hx    Rectal cancer Neg Hx    Allergies: Allergies  Allergen Reactions   Percocet [Oxycodone-Acetaminophen] Hives and Itching    'I itched for two day and had to take Bendadyl'    Mobic [Meloxicam] Swelling   Naproxen Other (See Comments)    Swelling    Nsaids Swelling    Swelling    Prednisone Other (See Comments)     Swelling, blood sugar problems   Shellfish Allergy Nausea Only and Swelling   Lyrica [Pregabalin] Other (See Comments)    "zombie"   Medications: See med rec.  Review of Systems: See HPI for pertinent positives and negatives.   Objective:    General: Well Developed, well nourished, and in no acute distress.  Neuro: Alert and oriented x3, extra-ocular muscles intact, sensation grossly intact.  HEENT: Normocephalic, atraumatic, pupils equal round reactive to light, neck supple, no masses, no lymphadenopathy, thyroid nonpalpable.  Skin: Warm and dry. See clinical photo.  Cardiac: Regular rate and rhythm, no murmurs rubs or gallops, no lower extremity edema.  Respiratory: Clear to auscultation bilaterally. Not using accessory muscles, speaking in full sentences.    Impression and Recommendations:    1. Scalp lesion Unclear etiology.  Lesion appears to be similar to a cutaneous horn but is deep into the scalp tissue.  Per patient request, lesion debrided using a #10 blade scalpel and tweezers.  No anesthesia necessary.  Able to get most of the material out of the divot in the scalp.  No bleeding noted.  At the base of the lesion, there appeared to be a hard piece of white material that could possibly be suture but cannot be sure without microscopic evaluation.  Advised patient to monitor closely for recurrence or signs of infection.  Triple antibiotic ointment dabbed onto the site after cleaning with rubbing alcohol.  Patient tolerated procedure very well.  Encouraged her to keep her dermatology appointment that she has scheduled for a few months away and they should be able to evaluate further if this does come back.  Return if symptoms worsen or fail to improve. ___________________________________________ Stacy Sorrel, DNP, APRN, FNP-BC [facility]

## 2020-12-17 ENCOUNTER — Other Ambulatory Visit (HOSPITAL_BASED_OUTPATIENT_CLINIC_OR_DEPARTMENT_OTHER): Payer: Self-pay

## 2020-12-17 MED ORDER — SODIUM FLUORIDE 1.1 % DT PSTE
PASTE | DENTAL | 5 refills | Status: AC
  Filled 2020-12-17 – 2020-12-18 (×2): qty 100, 30d supply, fill #0

## 2020-12-18 ENCOUNTER — Encounter: Payer: Self-pay | Admitting: Family

## 2020-12-18 ENCOUNTER — Other Ambulatory Visit (HOSPITAL_BASED_OUTPATIENT_CLINIC_OR_DEPARTMENT_OTHER): Payer: Self-pay

## 2020-12-19 ENCOUNTER — Other Ambulatory Visit (HOSPITAL_BASED_OUTPATIENT_CLINIC_OR_DEPARTMENT_OTHER): Payer: Self-pay

## 2020-12-28 ENCOUNTER — Inpatient Hospital Stay: Payer: No Typology Code available for payment source | Attending: Hematology & Oncology

## 2020-12-28 ENCOUNTER — Telehealth: Payer: Self-pay | Admitting: *Deleted

## 2020-12-28 ENCOUNTER — Encounter: Payer: Self-pay | Admitting: Family

## 2020-12-28 ENCOUNTER — Other Ambulatory Visit: Payer: Self-pay

## 2020-12-28 ENCOUNTER — Inpatient Hospital Stay (HOSPITAL_BASED_OUTPATIENT_CLINIC_OR_DEPARTMENT_OTHER): Payer: No Typology Code available for payment source | Admitting: Family

## 2020-12-28 VITALS — BP 100/62 | HR 90 | Temp 98.5°F | Resp 18 | Ht 62.0 in | Wt 206.1 lb

## 2020-12-28 DIAGNOSIS — K909 Intestinal malabsorption, unspecified: Secondary | ICD-10-CM | POA: Diagnosis present

## 2020-12-28 DIAGNOSIS — E611 Iron deficiency: Secondary | ICD-10-CM | POA: Diagnosis not present

## 2020-12-28 DIAGNOSIS — Z9884 Bariatric surgery status: Secondary | ICD-10-CM | POA: Insufficient documentation

## 2020-12-28 DIAGNOSIS — Z79899 Other long term (current) drug therapy: Secondary | ICD-10-CM | POA: Insufficient documentation

## 2020-12-28 DIAGNOSIS — D508 Other iron deficiency anemias: Secondary | ICD-10-CM | POA: Diagnosis present

## 2020-12-28 LAB — CBC WITH DIFFERENTIAL (CANCER CENTER ONLY)
Abs Immature Granulocytes: 0.09 10*3/uL — ABNORMAL HIGH (ref 0.00–0.07)
Basophils Absolute: 0.1 10*3/uL (ref 0.0–0.1)
Basophils Relative: 1 %
Eosinophils Absolute: 0.2 10*3/uL (ref 0.0–0.5)
Eosinophils Relative: 2 %
HCT: 41.3 % (ref 36.0–46.0)
Hemoglobin: 14 g/dL (ref 12.0–15.0)
Immature Granulocytes: 1 %
Lymphocytes Relative: 26 %
Lymphs Abs: 2.5 10*3/uL (ref 0.7–4.0)
MCH: 30.7 pg (ref 26.0–34.0)
MCHC: 33.9 g/dL (ref 30.0–36.0)
MCV: 90.6 fL (ref 80.0–100.0)
Monocytes Absolute: 0.8 10*3/uL (ref 0.1–1.0)
Monocytes Relative: 8 %
Neutro Abs: 6 10*3/uL (ref 1.7–7.7)
Neutrophils Relative %: 62 %
Platelet Count: 382 10*3/uL (ref 150–400)
RBC: 4.56 MIL/uL (ref 3.87–5.11)
RDW: 12.8 % (ref 11.5–15.5)
WBC Count: 9.6 10*3/uL (ref 4.0–10.5)
nRBC: 0 % (ref 0.0–0.2)

## 2020-12-28 LAB — RETICULOCYTES
Immature Retic Fract: 11.4 % (ref 2.3–15.9)
RBC.: 4.54 MIL/uL (ref 3.87–5.11)
Retic Count, Absolute: 54.9 10*3/uL (ref 19.0–186.0)
Retic Ct Pct: 1.2 % (ref 0.4–3.1)

## 2020-12-28 NOTE — Progress Notes (Signed)
Hematology and Oncology Follow Up Visit  RAIVYN Woods [id number] 25-Apr-1966 55 y.o. 12/28/2020   Principle Diagnosis:  Iron deficiency secondary to malabsorption - gastric bypass 2011  Current Therapy:   IV iron as indicated    Interim History:  Stacy Woods is here today for follow-up. She is doing well recuperating from her left knee surgery. She has had some issues with fluid build up and has had so have it drained twice. She follows up with her surgeon again next week.  She has not noted any blood loss since surgery. No bruising or petechiae.  No fever, chills, n/v, cough, rash, dizziness, SOB, chest pain, palpitations, abdominal pain or changes in bowel or bladder habits.  No numbness or tingling in her extremities.  No falls or syncope to report.  She has maintained a good appetite and is staying well hydrated. Her weight is stable at 206 lbs.   ECOG Performance Status: 1 - Symptomatic but completely ambulatory  Medications:  Allergies as of 12/28/2020       Reactions   Percocet [oxycodone-acetaminophen] Hives, Itching   'I itched for two day and had to take Bendadyl'    Mobic [meloxicam] Swelling   Naproxen Other (See Comments)   Swelling   Nsaids Swelling   Swelling   Prednisone Other (See Comments)   Swelling, blood sugar problems   Shellfish Allergy Nausea Only, Swelling   Lyrica [pregabalin] Other (See Comments)   "zombie"        Medication List        Accurate as of December 28, 2020 11:27 AM. If you have any questions, ask your nurse or doctor.          blood glucose meter kit and supplies Dispense based on patient and insurance preference. Use up to four times daily as directed. (FOR ICD-10 E10.9, E11.9).   escitalopram 10 MG tablet Commonly known as: LEXAPRO TAKE 1 TABLET BY MOUTH ONCE DAILY   glucagon 1 MG injection Inject 1 mg into the muscle once as needed for up to 1 dose. For low blood sugar   Lancets 30G Misc Check fasting blood sugar  daily   lisinopril-hydrochlorothiazide 10-12.5 MG tablet Commonly known as: ZESTORETIC TAKE 1 TABLET BY MOUTH ONCE DAILY   Sodium Fluoride 5000 PPM 1.1 % Pste Generic drug: Sodium Fluoride Use to brush teeth for 2 minutes in the evening as directed   zolpidem 5 MG tablet Commonly known as: AMBIEN TAKE 1 TABLET (5 MG TOTAL) BY MOUTH AT BEDTIME AS NEEDED. FOR SLEEP        Allergies:  Allergies  Allergen Reactions   Percocet [Oxycodone-Acetaminophen] Hives and Itching    'I itched for two day and had to take Bendadyl'    Mobic [Meloxicam] Swelling   Naproxen Other (See Comments)    Swelling    Nsaids Swelling    Swelling    Prednisone Other (See Comments)    Swelling, blood sugar problems   Shellfish Allergy Nausea Only and Swelling   Lyrica [Pregabalin] Other (See Comments)    "zombie"    Past Medical History, Surgical history, Social history, and Family History were reviewed and updated.  Review of Systems: All other 10 point review of systems is negative.   Physical Exam:  vitals were not taken for this visit.   Wt Readings from Last 3 Encounters:  12/11/20 207 lb (93.9 kg)  11/23/20 208 lb 15.9 oz (94.8 kg)  09/26/20 220 lb (99.8 kg)  Ocular: Sclerae unicteric, pupils equal, round and reactive to light Ear-nose-throat: Oropharynx clear, dentition fair Lymphatic: No cervical or supraclavicular adenopathy Lungs no rales or rhonchi, good excursion bilaterally Heart regular rate and rhythm, no murmur appreciated Abd soft, nontender, positive bowel sounds MSK no focal spinal tenderness, no joint edema Neuro: non-focal, well-oriented, appropriate affect Breasts: Deferred   Lab Results  Component Value Date   WBC 9.6 12/28/2020   HGB 14.0 12/28/2020   HCT 41.3 12/28/2020   MCV 90.6 12/28/2020   PLT 382 12/28/2020   Lab Results  Component Value Date   FERRITIN 78 09/26/2020   IRON 79 09/26/2020   TIBC 378 09/26/2020   UIBC 386 (H) 07/24/2020    IRONPCTSAT 21 09/26/2020   Lab Results  Component Value Date   RETICCTPCT 1.2 12/28/2020   RBC 4.54 12/28/2020   RBC 4.56 12/28/2020   RETICCTABS 42,750 09/26/2020   No results found for: KPAFRELGTCHN, LAMBDASER, KAPLAMBRATIO No results found for: IGGSERUM, IGA, IGMSERUM No results found for: Odetta Pink, SPEI   Chemistry      Component Value Date/Time   NA 138 11/19/2020 0800   NA 138 05/09/2016 1332   K 3.5 11/19/2020 0800   K 3.6 05/09/2016 1332   CL 106 11/19/2020 0800   CO2 22 11/19/2020 0800   CO2 20 (L) 05/09/2016 1332   BUN 10 11/19/2020 0800   BUN 12.2 05/09/2016 1332   CREATININE 1.02 (H) 11/19/2020 0800   CREATININE 1.01 (H) 07/24/2020 1051   CREATININE 0.97 06/20/2020 0000   CREATININE 0.9 05/09/2016 1332      Component Value Date/Time   CALCIUM 9.2 11/19/2020 0800   CALCIUM 9.1 05/09/2016 1332   ALKPHOS 105 07/24/2020 1051   ALKPHOS 121 05/09/2016 1332   AST 26 07/24/2020 1051   AST 34 05/09/2016 1332   ALT 23 07/24/2020 1051   ALT 28 05/09/2016 1332   BILITOT 0.7 07/24/2020 1051   BILITOT 0.47 05/09/2016 1332       Impression and Plan: Stacy Woods is a very pleasant 55 yo caucasian female with history of symptomatic iron deficiency secondary to malabsorption s/p gastric bypass in June 2011. Iron studies are pending. We will replace again if needed.  Follow-up in 3 months.  She can contact our office with any questions or concerns.   Laverna Peace, NP 7/22/202211:27 AM

## 2020-12-28 NOTE — Telephone Encounter (Signed)
Per 12/28/20 los called and lvm of upcoming appointments - requested callback to confirm - mailed calendar

## 2020-12-31 ENCOUNTER — Telehealth: Payer: Self-pay

## 2020-12-31 ENCOUNTER — Other Ambulatory Visit: Payer: Self-pay | Admitting: Family

## 2020-12-31 LAB — IRON AND TIBC
Iron: 70 ug/dL (ref 41–142)
Saturation Ratios: 19 % — ABNORMAL LOW (ref 21–57)
TIBC: 366 ug/dL (ref 236–444)
UIBC: 296 ug/dL (ref 120–384)

## 2020-12-31 LAB — FERRITIN: Ferritin: 62 ng/mL (ref 11–307)

## 2021-01-01 ENCOUNTER — Other Ambulatory Visit: Payer: Self-pay | Admitting: Family

## 2021-01-02 MED FILL — Zolpidem Tartrate Tab 5 MG: ORAL | 30 days supply | Qty: 30 | Fill #3 | Status: AC

## 2021-01-03 ENCOUNTER — Other Ambulatory Visit: Payer: Self-pay

## 2021-01-03 ENCOUNTER — Other Ambulatory Visit: Payer: Self-pay | Admitting: Medical-Surgical

## 2021-01-03 ENCOUNTER — Other Ambulatory Visit (HOSPITAL_BASED_OUTPATIENT_CLINIC_OR_DEPARTMENT_OTHER): Payer: Self-pay

## 2021-01-03 ENCOUNTER — Encounter: Payer: Self-pay | Admitting: Medical-Surgical

## 2021-01-03 ENCOUNTER — Inpatient Hospital Stay: Payer: No Typology Code available for payment source

## 2021-01-03 VITALS — BP 114/69 | HR 80 | Temp 98.7°F | Resp 17

## 2021-01-03 DIAGNOSIS — D508 Other iron deficiency anemias: Secondary | ICD-10-CM

## 2021-01-03 MED ORDER — SODIUM CHLORIDE 0.9 % IV SOLN
Freq: Once | INTRAVENOUS | Status: AC
  Filled 2021-01-03: qty 250

## 2021-01-03 MED ORDER — SODIUM CHLORIDE 0.9 % IV SOLN
125.0000 mg | Freq: Once | INTRAVENOUS | Status: AC
  Administered 2021-01-03: 125 mg via INTRAVENOUS
  Filled 2021-01-03: qty 125

## 2021-01-03 MED ORDER — CYCLOBENZAPRINE HCL 10 MG PO TABS
ORAL_TABLET | Freq: Three times a day (TID) | ORAL | 1 refills | Status: DC | PRN
  Filled 2021-01-03: qty 90, 30d supply, fill #0
  Filled 2021-04-04: qty 90, 30d supply, fill #1

## 2021-01-03 NOTE — Patient Instructions (Signed)
Sodium Ferric Gluconate Complex injection What is this medication? SODIUM FERRIC GLUCONATE COMPLEX (SOE dee um FER ik GLOO koe nate KOM pleks) is an iron replacement. It is used with epoetin therapy to treat low iron levelsin patients who are receiving hemodialysis. This medicine may be used for other purposes; ask your health care provider orpharmacist if you have questions. COMMON BRAND NAME(S): Ferrlecit, Nulecit What should I tell my care team before I take this medication? They need to know if you have any of the following conditions: anemia that is not from iron deficiency high levels of iron in the body an unusual or allergic reaction to iron, benzyl alcohol, other medicines, foods, dyes, or preservatives pregnant or are trying to become pregnant breast-feeding How should I use this medication? This medicine is for infusion into a vein. It is given by a health careprofessional in a hospital or clinic setting. Talk to your pediatrician regarding the use of this medicine in children. While this drug may be prescribed for children as young as 6 years old for selectedconditions, precautions do apply. Overdosage: If you think you have taken too much of this medicine contact apoison control center or emergency room at once. NOTE: This medicine is only for you. Do not share this medicine with others. What if I miss a dose? It is important not to miss your dose. Call your doctor or health careprofessional if you are unable to keep an appointment. What may interact with this medication? Do not take this medicine with any of the following medications: deferoxamine dimercaprol other iron products This medicine may also interact with the following medications: chloramphenicol deferasirox medicine for blood pressure like enalapril This list may not describe all possible interactions. Give your health care provider a list of all the medicines, herbs, non-prescription drugs, or dietary  supplements you use. Also tell them if you smoke, drink alcohol, or use illegaldrugs. Some items may interact with your medicine. What should I watch for while using this medication? Your condition will be monitored carefully while you are receiving thismedicine. Visit your doctor for check-ups as directed. What side effects may I notice from receiving this medication? Side effects that you should report to your doctor or health care professionalas soon as possible: allergic reactions like skin rash, itching or hives, swelling of the face, lips, or tongue breathing problems changes in hearing changes in vision chills, flushing, or sweating fast, irregular heartbeat feeling faint or lightheaded, falls fever, flu-like symptoms high or low blood pressure pain, tingling, numbness in the hands or feet severe pain in the chest, back, flanks, or groin swelling of the ankles, feet, hands trouble passing urine or change in the amount of urine unusually weak or tired Side effects that usually do not require medical attention (report to yourdoctor or health care professional if they continue or are bothersome): cramps dark colored stools diarrhea headache nausea, vomiting stomach upset This list may not describe all possible side effects. Call your doctor for medical advice about side effects. You may report side effects to FDA at1-800-FDA-1088. Where should I keep my medication? This drug is given in a hospital or clinic and will not be stored at home. NOTE: This sheet is a summary. It may not cover all possible information. If you have questions about this medicine, talk to your doctor, pharmacist, orhealth care provider.  2022 Elsevier/Gold Standard (2008-01-26 15:58:57)  

## 2021-01-03 NOTE — Progress Notes (Signed)
Patient declined to stay for the post infusion observation period. Patient denies any difficulty with this infusion in the past and is aware to call with any questions or concerns.   Pt verbalized understanding and had no further questions today.   

## 2021-01-10 ENCOUNTER — Other Ambulatory Visit: Payer: Self-pay

## 2021-01-10 ENCOUNTER — Inpatient Hospital Stay: Payer: No Typology Code available for payment source | Attending: Hematology & Oncology

## 2021-01-10 VITALS — BP 103/79 | HR 78 | Temp 99.6°F | Resp 18

## 2021-01-10 DIAGNOSIS — K909 Intestinal malabsorption, unspecified: Secondary | ICD-10-CM | POA: Insufficient documentation

## 2021-01-10 DIAGNOSIS — D508 Other iron deficiency anemias: Secondary | ICD-10-CM | POA: Diagnosis not present

## 2021-01-10 MED ORDER — SODIUM CHLORIDE 0.9 % IV SOLN
Freq: Once | INTRAVENOUS | Status: AC
  Filled 2021-01-10: qty 250

## 2021-01-10 MED ORDER — SODIUM CHLORIDE 0.9 % IV SOLN
125.0000 mg | Freq: Once | INTRAVENOUS | Status: AC
  Administered 2021-01-10: 125 mg via INTRAVENOUS
  Filled 2021-01-10: qty 125

## 2021-01-16 ENCOUNTER — Other Ambulatory Visit: Payer: Self-pay | Admitting: Medical-Surgical

## 2021-01-16 ENCOUNTER — Other Ambulatory Visit (HOSPITAL_BASED_OUTPATIENT_CLINIC_OR_DEPARTMENT_OTHER): Payer: Self-pay

## 2021-01-16 MED ORDER — LISINOPRIL-HYDROCHLOROTHIAZIDE 10-12.5 MG PO TABS
1.0000 | ORAL_TABLET | Freq: Every day | ORAL | 0 refills | Status: DC
Start: 2021-01-16 — End: 2021-04-24
  Filled 2021-01-16: qty 90, 90d supply, fill #0

## 2021-01-17 ENCOUNTER — Other Ambulatory Visit: Payer: Self-pay

## 2021-01-17 ENCOUNTER — Inpatient Hospital Stay: Payer: No Typology Code available for payment source

## 2021-01-17 VITALS — BP 107/64 | HR 90 | Temp 98.6°F | Resp 17

## 2021-01-17 DIAGNOSIS — D508 Other iron deficiency anemias: Secondary | ICD-10-CM

## 2021-01-17 MED ORDER — SODIUM CHLORIDE 0.9 % IV SOLN
125.0000 mg | Freq: Once | INTRAVENOUS | Status: AC
  Administered 2021-01-17: 125 mg via INTRAVENOUS
  Filled 2021-01-17: qty 125

## 2021-01-17 MED ORDER — SODIUM CHLORIDE 0.9 % IV SOLN
Freq: Once | INTRAVENOUS | Status: AC
  Filled 2021-01-17: qty 250

## 2021-01-17 NOTE — Progress Notes (Signed)
Patient declined to stay for the post infusion observation period. Patient denies any difficulty with this infusion in the past and is aware to call with any questions or concerns.   Pt verbalized understanding and had no further questions today.   

## 2021-02-03 MED FILL — Zolpidem Tartrate Tab 5 MG: ORAL | 30 days supply | Qty: 30 | Fill #4 | Status: AC

## 2021-02-04 ENCOUNTER — Other Ambulatory Visit (HOSPITAL_BASED_OUTPATIENT_CLINIC_OR_DEPARTMENT_OTHER): Payer: Self-pay

## 2021-02-07 ENCOUNTER — Other Ambulatory Visit (HOSPITAL_BASED_OUTPATIENT_CLINIC_OR_DEPARTMENT_OTHER): Payer: Self-pay

## 2021-02-07 MED FILL — Escitalopram Oxalate Tab 10 MG (Base Equiv): ORAL | 90 days supply | Qty: 90 | Fill #1 | Status: AC

## 2021-02-14 ENCOUNTER — Ambulatory Visit (INDEPENDENT_AMBULATORY_CARE_PROVIDER_SITE_OTHER): Payer: No Typology Code available for payment source | Admitting: Medical-Surgical

## 2021-02-14 ENCOUNTER — Other Ambulatory Visit: Payer: Self-pay

## 2021-02-14 VITALS — BP 107/66 | HR 74

## 2021-02-14 DIAGNOSIS — Z23 Encounter for immunization: Secondary | ICD-10-CM

## 2021-02-14 NOTE — Addendum Note (Signed)
Addended by: Annamaria Helling on: 02/14/2021 09:29 AM   Modules accepted: Orders, Level of Service

## 2021-02-14 NOTE — Progress Notes (Signed)
Patient is here for a flu vaccine. Verified no previous allergy to flu vaccine, eggs, or latex. Flu injection to left deltoid with no apparent complications. Patient advised to call with any problems.   

## 2021-03-01 ENCOUNTER — Encounter: Payer: Self-pay | Admitting: Medical-Surgical

## 2021-03-01 DIAGNOSIS — M25562 Pain in left knee: Secondary | ICD-10-CM

## 2021-03-06 ENCOUNTER — Other Ambulatory Visit: Payer: Self-pay | Admitting: Medical-Surgical

## 2021-03-07 ENCOUNTER — Other Ambulatory Visit (HOSPITAL_BASED_OUTPATIENT_CLINIC_OR_DEPARTMENT_OTHER): Payer: Self-pay

## 2021-03-07 MED ORDER — ZOLPIDEM TARTRATE 5 MG PO TABS
5.0000 mg | ORAL_TABLET | Freq: Every evening | ORAL | 0 refills | Status: DC | PRN
  Filled 2021-03-07: qty 30, 30d supply, fill #0

## 2021-03-08 ENCOUNTER — Other Ambulatory Visit (HOSPITAL_BASED_OUTPATIENT_CLINIC_OR_DEPARTMENT_OTHER): Payer: Self-pay

## 2021-03-08 ENCOUNTER — Encounter: Payer: Self-pay | Admitting: Medical-Surgical

## 2021-03-08 MED ORDER — ZOLPIDEM TARTRATE 5 MG PO TABS: 5.0000 mg | ORAL_TABLET | Freq: Every evening | ORAL | 0 refills | Status: DC | PRN

## 2021-03-08 NOTE — Telephone Encounter (Signed)
It appears that this request was approved by Caleen Jobs on 03/07/2021, but it is marked "prohibited".  Can you please send RX to [facility]?  Charyl Bigger, CMA

## 2021-03-11 ENCOUNTER — Encounter: Payer: Self-pay | Admitting: Medical-Surgical

## 2021-03-11 ENCOUNTER — Ambulatory Visit (INDEPENDENT_AMBULATORY_CARE_PROVIDER_SITE_OTHER): Payer: No Typology Code available for payment source | Admitting: Medical-Surgical

## 2021-03-11 ENCOUNTER — Other Ambulatory Visit: Payer: Self-pay

## 2021-03-11 VITALS — BP 115/81 | HR 75 | Resp 20 | Ht 62.0 in | Wt 203.0 lb

## 2021-03-11 DIAGNOSIS — R29818 Other symptoms and signs involving the nervous system: Secondary | ICD-10-CM | POA: Diagnosis not present

## 2021-03-11 DIAGNOSIS — W19XXXA Unspecified fall, initial encounter: Secondary | ICD-10-CM | POA: Diagnosis not present

## 2021-03-11 DIAGNOSIS — Y92009 Unspecified place in unspecified non-institutional (private) residence as the place of occurrence of the external cause: Secondary | ICD-10-CM

## 2021-03-11 DIAGNOSIS — M5442 Lumbago with sciatica, left side: Secondary | ICD-10-CM

## 2021-03-11 NOTE — Progress Notes (Signed)
  HPI with pertinent ROS:   CC: Back and left leg pain after fall  HPI: Pleasant 55 year old female presenting today for evaluation of back and left leg pain after a fall at home.  Notes that she was pet sitting for a ball mastiff/great Dane mix who weighs 185 pounds when she slipped in his drool and had a fall.  She landed with impact on her left knee followed by her left buttock and right elbow.  She was able to get up and move around afterwards but was quite uncomfortable.  The fall happened on 9/21 and she was evaluated on 9/23 by [facility] orthopedic.  She was very concerned because her left knee is post surgical repair and she had a very large hematoma at the site.  X-rays were completed of her left knee and lumbar spine with no evidence of fractures.  She has since had a resolution of the large hematoma but has continued to have significant pain extending from the lumbar spine down the left buttock into the posterior thigh but also affecting the lateral left knee and down the anterior shin.  She has had progressive weakness of the left lower extremity and at times is having trouble with tripping because she does not feel like she can pick her foot up all the way when she is walking.  Has also noted a new onset of urinary urgency nearing incontinence since her fall.  I reviewed the past medical history, family history, social history, surgical history, and allergies today and no changes were needed.  Please see the problem list section below in epic for further details.  Physical exam:   General: Well Developed, well nourished, and in no acute distress.  Neuro: Alert and oriented x3.  HEENT: Normocephalic, atraumatic.  Skin: Warm and dry. Cardiac: Regular rate and rhythm, no murmurs rubs or gallops, no lower extremity edema.  Respiratory: Clear to auscultation bilaterally. Not using accessory muscles, speaking in full sentences. MSK: Gait antalgic with a noticeable left sided limp.  Weakness to  dorsiflexion and plantarflexion of the right ankle as well as flexion/extension of the left knee.  Pulses 2+ bilaterally, normal cap refill.   Impression and Recommendations:    1. Fall in home, initial encounter 2. Acute left-sided low back pain with left-sided sciatica 3. Neurological deficit present Negative x-rays with [place] neurology per patient report.  With neurological deficit and left lower extremity weakness, concern for compression of nerves at the lumbar spine.  Ordering stat MRI for further evaluation. - MR Lumbar Spine Wo Contrast; Future  Return if symptoms worsen or fail to improve. ___________________________________________ Clearnce Sorrel, DNP, APRN, FNP-BC [facility] and [place]

## 2021-03-12 ENCOUNTER — Ambulatory Visit (HOSPITAL_COMMUNITY)
Admission: RE | Admit: 2021-03-12 | Discharge: 2021-03-12 | Disposition: A | Discharge status: Home / Self Care | Payer: No Typology Code available for payment source | Source: Ambulatory Visit | Attending: Medical-Surgical | Admitting: Medical-Surgical

## 2021-03-12 ENCOUNTER — Encounter: Payer: Self-pay | Admitting: Medical-Surgical

## 2021-03-12 DIAGNOSIS — M47816 Spondylosis without myelopathy or radiculopathy, lumbar region: Secondary | ICD-10-CM | POA: Diagnosis not present

## 2021-03-12 DIAGNOSIS — Y92009 Unspecified place in unspecified non-institutional (private) residence as the place of occurrence of the external cause: Secondary | ICD-10-CM | POA: Insufficient documentation

## 2021-03-12 DIAGNOSIS — M5126 Other intervertebral disc displacement, lumbar region: Secondary | ICD-10-CM | POA: Diagnosis not present

## 2021-03-12 DIAGNOSIS — R29818 Other symptoms and signs involving the nervous system: Secondary | ICD-10-CM | POA: Insufficient documentation

## 2021-03-12 DIAGNOSIS — M5442 Lumbago with sciatica, left side: Secondary | ICD-10-CM | POA: Diagnosis present

## 2021-03-12 DIAGNOSIS — W19XXXA Unspecified fall, initial encounter: Secondary | ICD-10-CM | POA: Insufficient documentation

## 2021-03-14 ENCOUNTER — Ambulatory Visit (INDEPENDENT_AMBULATORY_CARE_PROVIDER_SITE_OTHER): Payer: No Typology Code available for payment source | Admitting: Physician Assistant

## 2021-03-14 ENCOUNTER — Other Ambulatory Visit: Payer: Self-pay

## 2021-03-14 DIAGNOSIS — L570 Actinic keratosis: Secondary | ICD-10-CM | POA: Diagnosis not present

## 2021-03-14 DIAGNOSIS — D485 Neoplasm of uncertain behavior of skin: Secondary | ICD-10-CM

## 2021-03-14 DIAGNOSIS — Z808 Family history of malignant neoplasm of other organs or systems: Secondary | ICD-10-CM | POA: Diagnosis not present

## 2021-03-14 DIAGNOSIS — Z1283 Encounter for screening for malignant neoplasm of skin: Secondary | ICD-10-CM

## 2021-03-14 DIAGNOSIS — L720 Epidermal cyst: Secondary | ICD-10-CM | POA: Diagnosis not present

## 2021-03-14 NOTE — Patient Instructions (Signed)

## 2021-03-18 ENCOUNTER — Encounter: Payer: Self-pay | Admitting: Physician Assistant

## 2021-03-18 NOTE — Progress Notes (Signed)
   New Patient   Subjective  Stacy Woods is a 55 y.o. female who presents for the following: Annual Exam (Here for skin exam. Concerns right cheek under eye, top of scalp. Patients PCP took a look at top of scalp because she did have cyst removed and thought it could be stitch but they didn't see anything. No history of skin cancer. Family history of skin cancer. ).   The following portions of the chart were reviewed this encounter and updated as appropriate:  Tobacco  Allergies  Meds  Problems  Med Hx  Surg Hx  Fam Hx      Objective  Well appearing patient in no apparent distress; mood and affect are within normal limits.  A full examination was performed including scalp, head, eyes, ears, nose, lips, neck, chest, axillae, abdomen, back, buttocks, bilateral upper extremities, bilateral lower extremities, hands, feet, fingers, toes, fingernails, and toenails. All findings within normal limits unless otherwise noted below.  Mid Scalp Small nodule.  Left Breast, Right Buccal Cheek, Right Lower Leg - Anterior Erythematous patches with gritty scale.      Assessment & Plan  Neoplasm of uncertain behavior of skin Mid Scalp  Skin / nail biopsy Type of biopsy: punch   Informed consent: discussed and consent obtained   Timeout: patient name, date of birth, surgical site, and procedure verified   Procedure prep:  Patient was prepped and draped in usual sterile fashion (Non sterile) Prep type:  Chlorhexidine Anesthesia: the lesion was anesthetized in a standard fashion   Anesthetic:  1% lidocaine w/ epinephrine 1-100,000 local infiltration Punch size:  6 mm Suture size:  4-0 Suture type: Prolene (polypropylene)   Suture removal (days):  10 Hemostasis achieved with: suture   Outcome: patient tolerated procedure well    Specimen 1 - Surgical pathology Differential Diagnosis: r/o cyst  Check Margins: No  AK (actinic keratosis) (3) Right Lower Leg - Anterior; Left Breast;  Right Buccal Cheek  Destruction of lesion - Left Breast, Right Buccal Cheek, Right Lower Leg - Anterior Complexity: simple   Destruction method: cryotherapy   Informed consent: discussed and consent obtained   Timeout:  patient name, date of birth, surgical site, and procedure verified Lesion destroyed using liquid nitrogen: Yes   Cryotherapy cycles:  3 Outcome: patient tolerated procedure well with no complications       I, Dannah Ryles, PA-C, have reviewed all documentation's for this visit.  The documentation on 03/18/21 for the exam, diagnosis, procedures and orders are all accurate and complete.

## 2021-03-25 ENCOUNTER — Other Ambulatory Visit: Payer: Self-pay

## 2021-03-25 ENCOUNTER — Ambulatory Visit (INDEPENDENT_AMBULATORY_CARE_PROVIDER_SITE_OTHER): Payer: No Typology Code available for payment source

## 2021-03-25 DIAGNOSIS — Z4802 Encounter for removal of sutures: Secondary | ICD-10-CM

## 2021-03-25 NOTE — Progress Notes (Signed)
Path to patient x2 suture removed and no signs or symptoms of infection

## 2021-03-28 ENCOUNTER — Encounter: Payer: Self-pay | Admitting: Family

## 2021-03-28 ENCOUNTER — Other Ambulatory Visit (HOSPITAL_BASED_OUTPATIENT_CLINIC_OR_DEPARTMENT_OTHER): Payer: Self-pay

## 2021-03-28 ENCOUNTER — Ambulatory Visit (INDEPENDENT_AMBULATORY_CARE_PROVIDER_SITE_OTHER): Payer: No Typology Code available for payment source | Admitting: Medical-Surgical

## 2021-03-28 ENCOUNTER — Encounter: Payer: Self-pay | Admitting: Medical-Surgical

## 2021-03-28 VITALS — BP 113/79 | HR 77 | Resp 20 | Ht 62.0 in | Wt 205.0 lb

## 2021-03-28 DIAGNOSIS — I10 Essential (primary) hypertension: Secondary | ICD-10-CM | POA: Diagnosis not present

## 2021-03-28 DIAGNOSIS — G4700 Insomnia, unspecified: Secondary | ICD-10-CM

## 2021-03-28 DIAGNOSIS — F411 Generalized anxiety disorder: Secondary | ICD-10-CM | POA: Diagnosis not present

## 2021-03-28 MED ORDER — HYDROCODONE BIT-HOMATROP MBR 5-1.5 MG/5ML PO SOLN
5.0000 mL | Freq: Three times a day (TID) | ORAL | 0 refills | Status: DC | PRN
  Filled 2021-03-28: qty 60, 4d supply, fill #0

## 2021-03-28 MED ORDER — BENZONATATE 200 MG PO CAPS
200.0000 mg | ORAL_CAPSULE | Freq: Three times a day (TID) | ORAL | 0 refills | Status: DC | PRN
  Filled 2021-03-28: qty 45, 15d supply, fill #0

## 2021-03-28 MED ORDER — ZOLPIDEM TARTRATE 5 MG PO TABS
5.0000 mg | ORAL_TABLET | Freq: Every evening | ORAL | 0 refills | Status: DC | PRN
  Filled 2021-03-28 – 2021-04-04 (×2): qty 30, 30d supply, fill #0

## 2021-03-28 NOTE — Progress Notes (Signed)
  HPI with pertinent ROS:   CC: Hypertension/mood follow-up  HPI: Pleasant 55 year old female presenting today for follow-up on:  Hypertension-taking lisinopril-HCTZ 10-12.5 mg daily, tolerating well without side effects.  Checking blood pressures at home with similar readings to today.  Aiming for a low-sodium diet.  Continues to try to exercise but is limited by musculoskeletal issues and fibromyalgia pain.  Mood-taking Lexapro 10 mg daily, tolerating well without side effects.  Notes that she has had more stress related to her job lately but feels the medication is doing well for her.  Denies SI/HI  Insomnia-taking Ambien 5 mg nightly, tolerating well without side effects.  Has been on this long-term and feels that this is helping her sleep at night.  Does not sleep well when she does not take it.  I reviewed the past medical history, family history, social history, surgical history, and allergies today and no changes were needed.  Please see the problem list section below in epic for further details.   Physical exam:   General: Well Developed, well nourished, and in no acute distress.  Neuro: Alert and oriented x3.  HEENT: Normocephalic, atraumatic.  Skin: Warm and dry. Cardiac: Regular rate and rhythm, no murmurs rubs or gallops, no lower extremity edema.  Respiratory: Clear to auscultation bilaterally. Not using accessory muscles, speaking in full sentences.  Impression and Recommendations:    1. Anxiety state Continue Lexapro 10 mg daily.  Symptoms well controlled  2. Essential hypertension, benign Blood pressure at goal today.  Continue lisinopril-HCTZ as prescribed.  Continue checking blood pressures at home and aim for low-sodium diet.  Exercise as much as tolerated and aim for a healthy weight.  3. Insomnia, unspecified type Continue Ambien 5 mg nightly.  Refill sent to pharmacy.  Return in about 6 months (around 09/26/2021) for HTN/mood/insomnia follow  up. ___________________________________________ Clearnce Sorrel, DNP, APRN, FNP-BC [facility] and [place]

## 2021-04-01 ENCOUNTER — Inpatient Hospital Stay (HOSPITAL_BASED_OUTPATIENT_CLINIC_OR_DEPARTMENT_OTHER): Payer: No Typology Code available for payment source | Admitting: Family

## 2021-04-01 ENCOUNTER — Inpatient Hospital Stay: Payer: No Typology Code available for payment source | Attending: Hematology & Oncology

## 2021-04-01 ENCOUNTER — Encounter: Payer: Self-pay | Admitting: Family

## 2021-04-01 ENCOUNTER — Encounter: Payer: Self-pay | Admitting: Medical-Surgical

## 2021-04-01 ENCOUNTER — Other Ambulatory Visit: Payer: Self-pay

## 2021-04-01 VITALS — BP 117/81 | HR 89 | Temp 98.6°F | Resp 18 | Wt 204.8 lb

## 2021-04-01 DIAGNOSIS — K909 Intestinal malabsorption, unspecified: Secondary | ICD-10-CM | POA: Insufficient documentation

## 2021-04-01 DIAGNOSIS — E611 Iron deficiency: Secondary | ICD-10-CM

## 2021-04-01 DIAGNOSIS — Z9884 Bariatric surgery status: Secondary | ICD-10-CM | POA: Insufficient documentation

## 2021-04-01 DIAGNOSIS — D508 Other iron deficiency anemias: Secondary | ICD-10-CM | POA: Diagnosis not present

## 2021-04-01 LAB — IRON AND TIBC
Iron: 101 ug/dL (ref 28–170)
Saturation Ratios: 26 % (ref 10.4–31.8)
TIBC: 393 ug/dL (ref 250–450)
UIBC: 292 ug/dL

## 2021-04-01 LAB — CBC WITH DIFFERENTIAL (CANCER CENTER ONLY)
Abs Immature Granulocytes: 0.07 10*3/uL (ref 0.00–0.07)
Basophils Absolute: 0.1 10*3/uL (ref 0.0–0.1)
Basophils Relative: 1 %
Eosinophils Absolute: 0.1 10*3/uL (ref 0.0–0.5)
Eosinophils Relative: 2 %
HCT: 43.4 % (ref 36.0–46.0)
Hemoglobin: 14.5 g/dL (ref 12.0–15.0)
Immature Granulocytes: 1 %
Lymphocytes Relative: 27 %
Lymphs Abs: 2.2 10*3/uL (ref 0.7–4.0)
MCH: 30.1 pg (ref 26.0–34.0)
MCHC: 33.4 g/dL (ref 30.0–36.0)
MCV: 90 fL (ref 80.0–100.0)
Monocytes Absolute: 0.7 10*3/uL (ref 0.1–1.0)
Monocytes Relative: 9 %
Neutro Abs: 5.1 10*3/uL (ref 1.7–7.7)
Neutrophils Relative %: 60 %
Platelet Count: 408 10*3/uL — ABNORMAL HIGH (ref 150–400)
RBC: 4.82 MIL/uL (ref 3.87–5.11)
RDW: 13 % (ref 11.5–15.5)
WBC Count: 8.3 10*3/uL (ref 4.0–10.5)
nRBC: 0 % (ref 0.0–0.2)

## 2021-04-01 LAB — RETICULOCYTES
Immature Retic Fract: 12.9 % (ref 2.3–15.9)
RBC.: 4.8 MIL/uL (ref 3.87–5.11)
Retic Count, Absolute: 58.6 10*3/uL (ref 19.0–186.0)
Retic Ct Pct: 1.2 % (ref 0.4–3.1)

## 2021-04-01 LAB — FERRITIN: Ferritin: 114 ng/mL (ref 11–307)

## 2021-04-01 NOTE — Progress Notes (Signed)
Hematology and Oncology Follow Up Visit  Stacy Woods [id number] 1965/06/25 55 y.o. 04/01/2021   Principle Diagnosis:  Iron deficiency secondary to malabsorption - gastric bypass 2011   Current Therapy:        IV iron as indicated    Interim History:  Stacy Woods is here today for follow-up. She is doing fairly well but currently has a sinus infection, congestion and dry cough. She has seen her PCP and is on [employer] and Nyquil.  No fever, chills, n/v, rash, dizziness, SOB, chest pain, palpitations, abdominal pain or changes in bowel habits.  She has had some urinary urgency but so far her work up with PCP has been negative.  No swelling, tenderness, numbness or tingling in her extremities at this time.  She slipped and fell in some dog drool at home. Thankfully she states that she was not seriously injured.  No syncope.  She has been eating well and staying hydrated. Her weight is stable at 204 lbs.  No blood loss noted. No abnormal or excessive bruising. No petechiae.   ECOG Performance Status: 1 - Symptomatic but completely ambulatory  Medications:  Allergies as of 04/01/2021       Reactions   Lyrica [pregabalin] Other (See Comments)   "zombie"   Percocet [oxycodone-acetaminophen] Hives, Itching   'I itched for two day and had to take Bendadyl'    Mobic [meloxicam] Swelling   Naproxen Swelling      Nsaids Swelling      Prednisone Other (See Comments)   Swelling, blood sugar problems   Shellfish Allergy Nausea Only, Swelling        Medication List        Accurate as of April 01, 2021 11:08 AM. If you have any questions, ask your nurse or doctor.          benzonatate 200 MG capsule Commonly known as: TESSALON Take 1 capsule (200 mg total) by mouth 3 (three) times daily as needed for cough.   blood glucose meter kit and supplies Dispense based on patient and insurance preference. Use up to four times daily as directed. (FOR ICD-10 E10.9, E11.9).    cyclobenzaprine 10 MG tablet Commonly known as: FLEXERIL TAKE 1 TABLET BY MOUTH THREE TIMES DAILY AS NEEDED FOR MUSCLE SPASMS   escitalopram 10 MG tablet Commonly known as: LEXAPRO TAKE 1 TABLET BY MOUTH ONCE DAILY   glucagon 1 MG injection Inject 1 mg into the muscle once as needed for up to 1 dose. For low blood sugar   HYDROcodone bit-homatropine 5-1.5 MG/5ML syrup Commonly known as: HYCODAN Take 5 mLs by mouth every 8 (eight) hours as needed for cough.   Lancets 30G Misc Check fasting blood sugar daily   lisinopril-hydrochlorothiazide 10-12.5 MG tablet Commonly known as: ZESTORETIC Take 1 tablet by mouth daily.   Sodium Fluoride 5000 PPM 1.1 % Pste Generic drug: Sodium Fluoride Use to brush teeth for 2 minutes in the evening as directed   zolpidem 5 MG tablet Commonly known as: AMBIEN Take 1 tablet (5 mg total) by mouth at bedtime as needed for sleep. PCP to place additional refills.        Allergies:  Allergies  Allergen Reactions   Lyrica [Pregabalin] Other (See Comments)    "zombie"   Percocet [Oxycodone-Acetaminophen] Hives and Itching    'I itched for two day and had to take Bendadyl'    Mobic [Meloxicam] Swelling   Naproxen Swelling  Nsaids Swelling        Prednisone Other (See Comments)    Swelling, blood sugar problems   Shellfish Allergy Nausea Only and Swelling    Past Medical History, Surgical history, Social history, and Family History were reviewed and updated.  Review of Systems: All other 10 point review of systems is negative.   Physical Exam:  weight is 204 lb 12.8 oz (92.9 kg). Her oral temperature is 98.6 F (37 C). Her blood pressure is 117/81 and her pulse is 89. Her respiration is 18 and oxygen saturation is 100%.   Wt Readings from Last 3 Encounters:  04/01/21 204 lb 12.8 oz (92.9 kg)  03/28/21 205 lb (93 kg)  03/11/21 203 lb (92.1 kg)    Ocular: Sclerae unicteric, pupils equal, round and reactive to  light Ear-nose-throat: Oropharynx clear, dentition fair Lymphatic: No cervical or supraclavicular adenopathy Lungs no rales or rhonchi, good excursion bilaterally Heart regular rate and rhythm, no murmur appreciated Abd soft, nontender, positive bowel sounds MSK no focal spinal tenderness, no joint edema Neuro: non-focal, well-oriented, appropriate affect Breasts: Deferred   Lab Results  Component Value Date   WBC 8.3 04/01/2021   HGB 14.5 04/01/2021   HCT 43.4 04/01/2021   MCV 90.0 04/01/2021   PLT 408 (H) 04/01/2021   Lab Results  Component Value Date   FERRITIN 62 12/28/2020   IRON 70 12/28/2020   TIBC 366 12/28/2020   UIBC 296 12/28/2020   IRONPCTSAT 19 (L) 12/28/2020   Lab Results  Component Value Date   RETICCTPCT 1.2 04/01/2021   RBC 4.82 04/01/2021   RBC 4.80 04/01/2021   RETICCTABS 42,750 09/26/2020   No results found for: KPAFRELGTCHN, LAMBDASER, KAPLAMBRATIO No results found for: IGGSERUM, IGA, IGMSERUM No results found for: Kathrynn Ducking, MSPIKE, SPEI   Chemistry      Component Value Date/Time   NA 138 11/19/2020 0800   NA 138 05/09/2016 1332   K 3.5 11/19/2020 0800   K 3.6 05/09/2016 1332   CL 106 11/19/2020 0800   CO2 22 11/19/2020 0800   CO2 20 (L) 05/09/2016 1332   BUN 10 11/19/2020 0800   BUN 12.2 05/09/2016 1332   CREATININE 1.02 (H) 11/19/2020 0800   CREATININE 1.01 (H) 07/24/2020 1051   CREATININE 0.97 06/20/2020 0000   CREATININE 0.9 05/09/2016 1332      Component Value Date/Time   CALCIUM 9.2 11/19/2020 0800   CALCIUM 9.1 05/09/2016 1332   ALKPHOS 105 07/24/2020 1051   ALKPHOS 121 05/09/2016 1332   AST 26 07/24/2020 1051   AST 34 05/09/2016 1332   ALT 23 07/24/2020 1051   ALT 28 05/09/2016 1332   BILITOT 0.7 07/24/2020 1051   BILITOT 0.47 05/09/2016 1332       Impression and Plan: Stacy Woods is a very pleasant 55 yo caucasian female with history of symptomatic iron deficiency  secondary to malabsorption s/p gastric bypass in June 2011. Iron studies pending. We will replace if needed.  Follow-up in 4 months.  She can contact our office with any questions or concerns.   Lottie Dawson, NP 10/24/202211:08 AM

## 2021-04-02 ENCOUNTER — Telehealth: Payer: Self-pay | Admitting: *Deleted

## 2021-04-02 NOTE — Telephone Encounter (Signed)
Per 04/02/21 los - gave upcoming appointments - confirmed

## 2021-04-04 ENCOUNTER — Other Ambulatory Visit (HOSPITAL_BASED_OUTPATIENT_CLINIC_OR_DEPARTMENT_OTHER): Payer: Self-pay

## 2021-04-24 ENCOUNTER — Other Ambulatory Visit (HOSPITAL_BASED_OUTPATIENT_CLINIC_OR_DEPARTMENT_OTHER): Payer: Self-pay

## 2021-04-24 ENCOUNTER — Other Ambulatory Visit: Payer: Self-pay | Admitting: Medical-Surgical

## 2021-04-24 MED ORDER — LISINOPRIL-HYDROCHLOROTHIAZIDE 10-12.5 MG PO TABS
1.0000 | ORAL_TABLET | Freq: Every day | ORAL | 0 refills | Status: DC
  Filled 2021-04-24: qty 90, 90d supply, fill #0

## 2021-05-04 ENCOUNTER — Other Ambulatory Visit: Payer: Self-pay | Admitting: Medical-Surgical

## 2021-05-04 MED FILL — Escitalopram Oxalate Tab 10 MG (Base Equiv): ORAL | 90 days supply | Qty: 90 | Fill #2 | Status: AC

## 2021-05-06 ENCOUNTER — Other Ambulatory Visit (HOSPITAL_BASED_OUTPATIENT_CLINIC_OR_DEPARTMENT_OTHER): Payer: Self-pay

## 2021-05-06 MED ORDER — ZOLPIDEM TARTRATE 5 MG PO TABS
5.0000 mg | ORAL_TABLET | Freq: Every evening | ORAL | 5 refills | Status: DC | PRN
  Filled 2021-05-06: qty 30, 30d supply, fill #0
  Filled 2021-06-02: qty 30, 30d supply, fill #1
  Filled 2021-06-30 – 2021-07-02 (×2): qty 30, 30d supply, fill #2
  Filled 2021-07-31: qty 30, 30d supply, fill #3
  Filled 2021-08-31: qty 30, 30d supply, fill #4
  Filled 2021-09-29: qty 30, 30d supply, fill #5

## 2021-05-06 NOTE — Telephone Encounter (Signed)
Last office visit 03/28/21

## 2021-05-07 ENCOUNTER — Telehealth: Payer: No Typology Code available for payment source | Admitting: Family Medicine

## 2021-05-07 ENCOUNTER — Other Ambulatory Visit (HOSPITAL_BASED_OUTPATIENT_CLINIC_OR_DEPARTMENT_OTHER): Payer: Self-pay

## 2021-05-07 DIAGNOSIS — R6889 Other general symptoms and signs: Secondary | ICD-10-CM

## 2021-05-07 MED ORDER — OSELTAMIVIR PHOSPHATE 75 MG PO CAPS
75.0000 mg | ORAL_CAPSULE | Freq: Two times a day (BID) | ORAL | 0 refills | Status: AC
  Filled 2021-05-07: qty 10, 5d supply, fill #0

## 2021-05-07 NOTE — Progress Notes (Signed)
E visit for Flu like symptoms   We are sorry that you are not feeling well.  Here is how we plan to help! Based on what you have shared with me it looks like you may have possible exposure to a virus that causes influenza.  Influenza or "the flu" is   an infection caused by a respiratory virus. The flu virus is highly contagious and persons who did not receive their yearly flu vaccination may "catch" the flu from close contact.  We have anti-viral medications to treat the viruses that cause this infection. They are not a "cure" and only shorten the course of the infection. These prescriptions are most effective when they are given within the first 2 days of "flu" symptoms. Antiviral medication are indicated if you have a high risk of complications from the flu. You should  also consider an antiviral medication if you are in close contact with someone who is at risk. These medications can help patients avoid complications from the flu  but have side effects that you should know. Possible side effects from Tamiflu or oseltamivir include nausea, vomiting, diarrhea, dizziness, headaches, eye redness, sleep problems or other respiratory symptoms. You should not take Tamiflu if you have an allergy to oseltamivir or any to the ingredients in Tamiflu.  Based upon your symptoms and potential risk factors I have prescribed Oseltamivir (Tamiflu).  It has been sent to your designated pharmacy.  You will take one 75 mg capsule orally twice a day for the next 5 days.  ANYONE WHO HAS FLU SYMPTOMS SHOULD: Stay home. The flu is highly contagious and going out or to work exposes others! Be sure to drink plenty of fluids. Water is fine as well as fruit juices, sodas and electrolyte beverages. You may want to stay away from caffeine or alcohol. If you are nauseated, try taking small sips of liquids. How do you know if you are getting enough fluid? Your urine should be a pale yellow or almost colorless. Get rest. Taking  a steamy shower or using a humidifier may help nasal congestion and ease sore throat pain. Using a saline nasal spray works much the same way. Cough drops, hard candies and sore throat lozenges may ease your cough. Line up a caregiver. Have someone check on you regularly.   GET HELP RIGHT AWAY IF: You cannot keep down liquids or your medications. You become short of breath Your fell like you are going to pass out or loose consciousness. Your symptoms persist after you have completed your treatment plan MAKE SURE YOU  Understand these instructions. Will watch your condition. Will get help right away if you are not doing well or get worse.  Your e-visit answers were reviewed by a board certified advanced clinical practitioner to complete your personal care plan.  Depending on the condition, your plan could have included both over the counter or prescription medications.  If there is a problem please reply  once you have received a response from your provider.  Your safety is important to us.  If you have drug allergies check your prescription carefully.    You can use MyChart to ask questions about today's visit, request a non-urgent call back, or ask for a work or school excuse for 24 hours related to this e-Visit. If it has been greater than 24 hours you will need to follow up with your provider, or enter a new e-Visit to address those concerns.  You will get an e-mail in the   next two days asking about your experience.  I hope that your e-visit has been valuable and will speed your recovery. Thank you for using e-visits.    I provided 5 minutes of non face-to-face time during this encounter for chart review, medication and order placement, as well as and documentation.   

## 2021-05-12 ENCOUNTER — Telehealth: Payer: No Typology Code available for payment source | Admitting: Nurse Practitioner

## 2021-05-12 DIAGNOSIS — R059 Cough, unspecified: Secondary | ICD-10-CM

## 2021-05-12 MED ORDER — BENZONATATE 100 MG PO CAPS: 100.0000 mg | ORAL_CAPSULE | Freq: Two times a day (BID) | ORAL | 0 refills | Status: DC | PRN

## 2021-05-12 NOTE — Progress Notes (Signed)
We are sorry that you are not feeling well.  Here is how we plan to help!  Based on your presentation I believe you most likely have A cough due to a virus.  This is called viral bronchitis and is best treated by rest, plenty of fluids and control of the cough.  You may use Ibuprofen or Tylenol as directed to help your symptoms.     In addition you may use A prescription cough medication called Tessalon Perles 100mg. You may take 1-2 capsules every 8 hours as needed for your cough.  From your responses in the eVisit questionnaire you describe inflammation in the upper respiratory tract which is causing a significant cough.  This is commonly called Bronchitis and has four common causes:   Allergies Viral Infections Acid Reflux Bacterial Infection Allergies, viruses and acid reflux are treated by controlling symptoms or eliminating the cause. An example might be a cough caused by taking certain blood pressure medications. You stop the cough by changing the medication. Another example might be a cough caused by acid reflux. Controlling the reflux helps control the cough.  USE OF BRONCHODILATOR ("RESCUE") INHALERS: There is a risk from using your bronchodilator too frequently.  The risk is that over-reliance on a medication which only relaxes the muscles surrounding the breathing tubes can reduce the effectiveness of medications prescribed to reduce swelling and congestion of the tubes themselves.  Although you feel brief relief from the bronchodilator inhaler, your asthma may actually be worsening with the tubes becoming more swollen and filled with mucus.  This can delay other crucial treatments, such as oral steroid medications. If you need to use a bronchodilator inhaler daily, several times per day, you should discuss this with your provider.  There are probably better treatments that could be used to keep your asthma under control.     HOME CARE Only take medications as instructed by your medical  team. Complete the entire course of an antibiotic. Drink plenty of fluids and get plenty of rest. Avoid close contacts especially the very young and the elderly Cover your mouth if you cough or cough into your sleeve. Always remember to wash your hands A steam or ultrasonic humidifier can help congestion.   GET HELP RIGHT AWAY IF: You develop worsening fever. You become short of breath You cough up blood. Your symptoms persist after you have completed your treatment plan MAKE SURE YOU  Understand these instructions. Will watch your condition. Will get help right away if you are not doing well or get worse.    Thank you for choosing an e-visit.  Your e-visit answers were reviewed by a board certified advanced clinical practitioner to complete your personal care plan. Depending upon the condition, your plan could have included both over the counter or prescription medications.  Please review your pharmacy choice. Make sure the pharmacy is open so you can pick up prescription now. If there is a problem, you may contact your provider through MyChart messaging and have the prescription routed to another pharmacy.  Your safety is important to us. If you have drug allergies check your prescription carefully.   For the next 24 hours you can use MyChart to ask questions about today's visit, request a non-urgent call back, or ask for a work or school excuse. You will get an email in the next two days asking about your experience. I hope that your e-visit has been valuable and will speed your recovery.   I have spent at   least 5 minutes reviewing and documenting in the patient's chart.  

## 2021-05-25 ENCOUNTER — Telehealth: Payer: No Typology Code available for payment source | Admitting: Nurse Practitioner

## 2021-05-25 DIAGNOSIS — J40 Bronchitis, not specified as acute or chronic: Secondary | ICD-10-CM | POA: Diagnosis not present

## 2021-05-25 MED ORDER — AZITHROMYCIN 250 MG PO TABS: ORAL_TABLET | ORAL | 0 refills | Status: DC

## 2021-05-25 MED ORDER — BENZONATATE 100 MG PO CAPS: 100.0000 mg | ORAL_CAPSULE | Freq: Three times a day (TID) | ORAL | 0 refills | Status: DC | PRN

## 2021-05-25 NOTE — Progress Notes (Signed)
We are sorry that you are not feeling well.  Here is how we plan to help! ? ?Based on your presentation I believe you most likely have A cough due to bacteria.  When patients have a fever and a productive cough with a change in color or increased sputum production, we are concerned about bacterial bronchitis.  If left untreated it can progress to pneumonia.  If your symptoms do not improve with your treatment plan it is important that you contact your provider.   I have prescribed Azithromyin 250 mg: two tablets now and then one tablet daily for 4 additonal days  ?  ?In addition you may use A prescription cough medication called Tessalon Perles 100mg. You may take 1-2 capsules every 8 hours as needed for your cough. ? ? ?From your responses in the eVisit questionnaire you describe inflammation in the upper respiratory tract which is causing a significant cough.  This is commonly called Bronchitis and has four common causes:   ?Allergies ?Viral Infections ?Acid Reflux ?Bacterial Infection ?Allergies, viruses and acid reflux are treated by controlling symptoms or eliminating the cause. An example might be a cough caused by taking certain blood pressure medications. You stop the cough by changing the medication. Another example might be a cough caused by acid reflux. Controlling the reflux helps control the cough. ? ?USE OF BRONCHODILATOR ("RESCUE") INHALERS: ?There is a risk from using your bronchodilator too frequently.  The risk is that over-reliance on a medication which only relaxes the muscles surrounding the breathing tubes can reduce the effectiveness of medications prescribed to reduce swelling and congestion of the tubes themselves.  Although you feel brief relief from the bronchodilator inhaler, your asthma may actually be worsening with the tubes becoming more swollen and filled with mucus.  This can delay other crucial treatments, such as oral steroid medications. If you need to use a bronchodilator  inhaler daily, several times per day, you should discuss this with your provider.  There are probably better treatments that could be used to keep your asthma under control.  ?   ?HOME CARE ?Only take medications as instructed by your medical team. ?Complete the entire course of an antibiotic. ?Drink plenty of fluids and get plenty of rest. ?Avoid close contacts especially the very young and the elderly ?Cover your mouth if you cough or cough into your sleeve. ?Always remember to wash your hands ?A steam or ultrasonic humidifier can help congestion.  ? ?GET HELP RIGHT AWAY IF: ?You develop worsening fever. ?You become short of breath ?You cough up blood. ?Your symptoms persist after you have completed your treatment plan ?MAKE SURE YOU  ?Understand these instructions. ?Will watch your condition. ?Will get help right away if you are not doing well or get worse. ?  ? ?Thank you for choosing an e-visit. ? ?Your e-visit answers were reviewed by a board certified advanced clinical practitioner to complete your personal care plan. Depending upon the condition, your plan could have included both over the counter or prescription medications. ? ?Please review your pharmacy choice. Make sure the pharmacy is open so you can pick up prescription now. If there is a problem, you may contact your provider through MyChart messaging and have the prescription routed to another pharmacy.  Your safety is important to us. If you have drug allergies check your prescription carefully.  ? ?For the next 24 hours you can use MyChart to ask questions about today's visit, request a non-urgent call back, or ask   for a work or school excuse. ?You will get an email in the next two days asking about your experience. I hope that your e-visit has been valuable and will speed your recovery. ? ?5-10 minutes spent reviewing and documenting in chart. ? ?

## 2021-06-04 ENCOUNTER — Other Ambulatory Visit (HOSPITAL_BASED_OUTPATIENT_CLINIC_OR_DEPARTMENT_OTHER): Payer: Self-pay

## 2021-06-04 ENCOUNTER — Other Ambulatory Visit: Payer: Self-pay

## 2021-06-04 ENCOUNTER — Ambulatory Visit (INDEPENDENT_AMBULATORY_CARE_PROVIDER_SITE_OTHER): Payer: No Typology Code available for payment source | Admitting: Medical-Surgical

## 2021-06-04 ENCOUNTER — Encounter: Payer: Self-pay | Admitting: Medical-Surgical

## 2021-06-04 VITALS — BP 122/88 | HR 85 | Resp 20 | Ht 62.0 in | Wt 207.9 lb

## 2021-06-04 DIAGNOSIS — R7303 Prediabetes: Secondary | ICD-10-CM | POA: Diagnosis not present

## 2021-06-04 DIAGNOSIS — J0100 Acute maxillary sinusitis, unspecified: Secondary | ICD-10-CM

## 2021-06-04 DIAGNOSIS — L404 Guttate psoriasis: Secondary | ICD-10-CM | POA: Diagnosis not present

## 2021-06-04 MED ORDER — BETAMETHASONE DIPROPIONATE 0.05 % EX OINT
TOPICAL_OINTMENT | Freq: Two times a day (BID) | CUTANEOUS | 1 refills | Status: DC
  Filled 2021-06-04: qty 45, 30d supply, fill #0

## 2021-06-04 MED ORDER — DOXYCYCLINE HYCLATE 100 MG PO TABS
100.0000 mg | ORAL_TABLET | Freq: Two times a day (BID) | ORAL | 0 refills | Status: AC
  Filled 2021-06-04: qty 14, 7d supply, fill #0

## 2021-06-04 MED ORDER — CYCLOBENZAPRINE HCL 10 MG PO TABS
ORAL_TABLET | Freq: Three times a day (TID) | ORAL | 1 refills | Status: DC | PRN
  Filled 2021-06-04: qty 90, 30d supply, fill #0
  Filled 2021-08-31: qty 90, 30d supply, fill #1

## 2021-06-04 NOTE — Progress Notes (Signed)
°  HPI with pertinent ROS:   CC: sores on legs  HPI: Pleasant 55 year old female presenting today for evaluation of sores on her bilateral legs.  The sores started 2-4 weeks ago and are patchy and intermittent.  Notes they start as small bright red spots before getting scaly.  They get itchy once they have scaled over and can be sore to touch.  Denies scratching at them but does have several that are scabbed over.  She has had no new exposures to medications, foods, cosmetics, chemicals, materials, or environmental agents.  She does have 2 dogs that occasionally go outside [place] and has not been working in the yard.  She does have a history of prediabetes and her father is diabetic so she is worried about worsening of her current situation.  Has tried over-the-counter hydrocortisone and eczema/dry skin lotions but did not see any benefit.  Notes that she has been sick for nearly a month after having the flu around Thanksgiving.  She did do the visits because she felt so bad and was prescribed Tessalon Perles as well as a Z-Pak.  She did not see any benefit from either of these medications.  Today she presents with continued sinus pressure, postnasal drip, facial pressure/pain, and dental pain when bending over.  Denies fever, chills, chest pain, and shortness of breath.  I reviewed the past medical history, family history, social history, surgical history, and allergies today and no changes were needed.  Please see the problem list section below in epic for further details.   Physical exam:   General: Well Developed, well nourished, and in no acute distress.  Neuro: Alert and oriented x3.  HEENT: Normocephalic, atraumatic.  Skin: Warm and dry. Cardiac: Regular rate and rhythm, no murmurs rubs or gallops, no lower extremity edema.  Respiratory: Clear to auscultation bilaterally. Not using accessory muscles, speaking in full  sentences.  Impression and Recommendations:    1. Guttate psoriasis Betamethasone twice daily as needed for up to 14 days.  If no benefit, may need to do a punch biopsy for further evaluation. - betamethasone dipropionate (DIPROLENE) 0.05 % ointment; Apply topically 2 (two) times daily to affected area(s) as needed for up to 14 days  Dispense: 45 g; Refill: 1  2. Acute non-recurrent maxillary sinusitis Doxycycline 100 mg twice daily x7 days.  Continue conservative measures at home. - doxycycline (VIBRA-TABS) 100 MG tablet; Take 1 tablet (100 mg total) by mouth 2 (two) times daily for 7 days.  Dispense: 14 tablet; Refill: 0  3. Prediabetes Rechecking hemoglobin A1c - Hemoglobin A1c  Return if symptoms worsen or fail to improve. ___________________________________________ Clearnce Sorrel, DNP, APRN, FNP-BC [facility] [place]

## 2021-06-05 LAB — HEMOGLOBIN A1C
Hgb A1c MFr Bld: 5.5 % of total Hgb (ref <5.7)
Mean Plasma Glucose: 111 mg/dL
eAG (mmol/L): 6.2 mmol/L

## 2021-06-12 ENCOUNTER — Other Ambulatory Visit (HOSPITAL_BASED_OUTPATIENT_CLINIC_OR_DEPARTMENT_OTHER): Payer: Self-pay | Admitting: Medical-Surgical

## 2021-06-12 DIAGNOSIS — Z1231 Encounter for screening mammogram for malignant neoplasm of breast: Secondary | ICD-10-CM

## 2021-06-19 ENCOUNTER — Ambulatory Visit (INDEPENDENT_AMBULATORY_CARE_PROVIDER_SITE_OTHER): Payer: No Typology Code available for payment source

## 2021-06-19 ENCOUNTER — Other Ambulatory Visit: Payer: Self-pay

## 2021-06-19 DIAGNOSIS — Z1231 Encounter for screening mammogram for malignant neoplasm of breast: Secondary | ICD-10-CM | POA: Diagnosis not present

## 2021-06-28 ENCOUNTER — Ambulatory Visit: Payer: No Typology Code available for payment source | Admitting: Physician Assistant

## 2021-07-01 ENCOUNTER — Other Ambulatory Visit (HOSPITAL_BASED_OUTPATIENT_CLINIC_OR_DEPARTMENT_OTHER): Payer: Self-pay

## 2021-07-02 ENCOUNTER — Other Ambulatory Visit (HOSPITAL_BASED_OUTPATIENT_CLINIC_OR_DEPARTMENT_OTHER): Payer: Self-pay

## 2021-07-11 ENCOUNTER — Ambulatory Visit (INDEPENDENT_AMBULATORY_CARE_PROVIDER_SITE_OTHER): Payer: No Typology Code available for payment source | Admitting: Physician Assistant

## 2021-07-11 ENCOUNTER — Other Ambulatory Visit: Payer: Self-pay

## 2021-07-11 DIAGNOSIS — D485 Neoplasm of uncertain behavior of skin: Secondary | ICD-10-CM

## 2021-07-11 DIAGNOSIS — L308 Other specified dermatitis: Secondary | ICD-10-CM

## 2021-07-11 NOTE — Patient Instructions (Signed)

## 2021-07-18 ENCOUNTER — Encounter: Payer: Self-pay | Admitting: Physician Assistant

## 2021-07-18 ENCOUNTER — Other Ambulatory Visit: Payer: Self-pay | Admitting: Medical-Surgical

## 2021-07-18 NOTE — Progress Notes (Signed)
° °  Follow-Up Visit   Subjective  Stacy Woods is a 56 y.o. female who presents for the following: Cyst (Possible cyst surgery top scalp post scalp. Patient stated she is on antibiotic for a flaring rash on the legs. They are getting worse. She has used betamethasone in the past on her legs without any improvement from another provider.    The following portions of the chart were reviewed this encounter and updated as appropriate:  Tobacco   Allergies   Meds   Problems   Med Hx   Surg Hx   Fam Hx       Objective  Well appearing patient in no apparent distress; mood and affect are within normal limits.  A full examination was performed including scalp, head, eyes, ears, nose, lips, neck, chest, axillae, abdomen, back, buttocks, bilateral upper extremities, bilateral lower extremities, hands, feet, fingers, toes, fingernails, and toenails. All findings within normal limits unless otherwise noted below.  Right Lower Leg - Posterior Hyperkeratotic scale with pink base           Assessment & Plan  Neoplasm of uncertain behavior of skin Right Lower Leg - Posterior  Skin / nail biopsy Type of biopsy: tangential   Informed consent: discussed and consent obtained   Timeout: patient name, date of birth, surgical site, and procedure verified   Anesthesia: the lesion was anesthetized in a standard fashion   Anesthetic:  1% lidocaine w/ epinephrine 1-100,000 local infiltration Instrument used: flexible razor blade   Hemostasis achieved with: aluminum chloride and electrodesiccation   Outcome: patient tolerated procedure well   Post-procedure details: wound care instructions given    Specimen 1 - Surgical pathology Differential Diagnosis: r/o psoriasis, bcc , scc, porokeratosis   Check Margins: No   Reschedule surgery on scalp.  I, Lajuana Patchell, PA-C, have reviewed all documentation's for this visit.  The documentation on 07/18/21 for the exam, diagnosis, procedures and orders  are all accurate and complete.

## 2021-07-19 ENCOUNTER — Other Ambulatory Visit (HOSPITAL_BASED_OUTPATIENT_CLINIC_OR_DEPARTMENT_OTHER): Payer: Self-pay

## 2021-07-19 MED ORDER — LISINOPRIL-HYDROCHLOROTHIAZIDE 10-12.5 MG PO TABS
1.0000 | ORAL_TABLET | Freq: Every day | ORAL | 0 refills | Status: DC
  Filled 2021-07-19: qty 90, 90d supply, fill #0

## 2021-07-23 ENCOUNTER — Other Ambulatory Visit (HOSPITAL_BASED_OUTPATIENT_CLINIC_OR_DEPARTMENT_OTHER): Payer: Self-pay

## 2021-07-23 ENCOUNTER — Telehealth: Payer: Self-pay

## 2021-07-23 NOTE — Telephone Encounter (Signed)
Patient called to find out if it is okay to stop taking Lisinopril per [facility].  She has an appointment with [facility] 08/01/2021

## 2021-07-24 ENCOUNTER — Encounter: Payer: Self-pay | Admitting: Medical-Surgical

## 2021-07-25 ENCOUNTER — Other Ambulatory Visit: Payer: Self-pay | Admitting: Medical-Surgical

## 2021-07-25 MED ORDER — HYDROCHLOROTHIAZIDE 25 MG PO TABS: 25.0000 mg | ORAL_TABLET | Freq: Every day | ORAL | 1 refills | Status: DC

## 2021-07-31 ENCOUNTER — Other Ambulatory Visit: Payer: Self-pay | Admitting: Family Medicine

## 2021-07-31 DIAGNOSIS — F411 Generalized anxiety disorder: Secondary | ICD-10-CM

## 2021-07-31 NOTE — Telephone Encounter (Signed)
Spoke with patient, this has already been addressed in a [place].

## 2021-08-01 ENCOUNTER — Other Ambulatory Visit (HOSPITAL_BASED_OUTPATIENT_CLINIC_OR_DEPARTMENT_OTHER): Payer: Self-pay

## 2021-08-01 ENCOUNTER — Encounter: Payer: Self-pay | Admitting: Physician Assistant

## 2021-08-01 ENCOUNTER — Ambulatory Visit (INDEPENDENT_AMBULATORY_CARE_PROVIDER_SITE_OTHER): Payer: No Typology Code available for payment source | Admitting: Physician Assistant

## 2021-08-01 ENCOUNTER — Other Ambulatory Visit: Payer: Self-pay

## 2021-08-01 DIAGNOSIS — D489 Neoplasm of uncertain behavior, unspecified: Secondary | ICD-10-CM

## 2021-08-01 DIAGNOSIS — R21 Rash and other nonspecific skin eruption: Secondary | ICD-10-CM | POA: Diagnosis not present

## 2021-08-01 DIAGNOSIS — L7211 Pilar cyst: Secondary | ICD-10-CM | POA: Diagnosis not present

## 2021-08-01 NOTE — Progress Notes (Signed)
° °  Follow-Up Visit   Subjective  Stacy Woods is a 56 y.o. female who presents for the following: Procedure (Cyst removal on scalp x 3). Her rash is doing a little better after discontinuing lisinopril. Will recheck in 6 weeks.  She is managing her blood pressure and checking it often.   The following portions of the chart were reviewed this encounter and updated as appropriate:  Tobacco   Allergies   Meds   Problems   Med Hx   Surg Hx   Fam Hx       Objective  Well appearing patient in no apparent distress; mood and affect are within normal limits.  A focused examination was performed including scalp and legs. Relevant physical exam findings are noted in the Assessment and Plan.  Mid Parietal Scalp 1cm immobile nodule  mid frontal scalp 1cm immobile nodule  left frontal scalp 1cm immobile nodule  Left Lower Leg - Anterior, Right Lower Leg - Anterior Pink plaques with decreased scale   Assessment & Plan  Neoplasm of uncertain behavior (3) Mid Parietal Scalp  Skin / nail biopsy Type of biopsy: punch   Timeout: patient name, date of birth, surgical site, and procedure verified   Anesthesia: the lesion was anesthetized in a standard fashion   Anesthetic:  1% lidocaine w/ epinephrine 1-100,000 local infiltration Punch size:  5 mm Suture size:  4-0 Suture type: nylon   Suture removal (days):  12 Additional details:  Nylon x 2   Specimen 1 - Surgical pathology Differential Diagnosis: r.o cyst  Check Margins: No  mid frontal scalp  Skin / nail biopsy Type of biopsy: punch   Informed consent: discussed and consent obtained   Procedure prep:  Patient was prepped and draped in usual sterile fashion (nonsterile) Prep type:  Chlorhexidine Anesthesia: the lesion was anesthetized in a standard fashion   Anesthetic:  1% lidocaine w/ epinephrine 1-100,000 local infiltration Punch size:  5 mm Suture size:  4-0 Suture type: nylon   Suture removal (days):  12 Outcome:  patient tolerated procedure well   Post-procedure details: wound care instructions given   Post-procedure details comment:  Nonsterile Additional details:  4-0 x 2   Specimen 2 - Surgical pathology Differential Diagnosis: r/o cyst  Check Margins: No  left frontal scalp  Skin / nail biopsy Type of biopsy: punch   Informed consent: discussed and consent obtained   Procedure prep:  Patient was prepped and draped in usual sterile fashion (nonsterile) Prep type:  Chlorhexidine Anesthesia: the lesion was anesthetized in a standard fashion   Anesthetic:  1% lidocaine w/ epinephrine 1-100,000 local infiltration Punch size:  5 mm Suture size:  4-0 Suture type: nylon   Suture removal (days):  12 Outcome: patient tolerated procedure well   Post-procedure details: wound care instructions given   Post-procedure details comment:  Nonsterile Additional details:  Nylon x 2  Specimen 3 - Surgical pathology Differential Diagnosis: r/o cyst  Check Margins: No  Rash and other nonspecific skin eruption Left Lower Leg - Anterior; Right Lower Leg - Anterior  Continue clobetasol cream- wet compress and occlude over night    I, Deone Omahoney, PA-C, have reviewed all documentation's for this visit.  The documentation on 08/01/21 for the exam, diagnosis, procedures and orders are all accurate and complete.

## 2021-08-01 NOTE — Patient Instructions (Signed)

## 2021-08-02 ENCOUNTER — Inpatient Hospital Stay (HOSPITAL_BASED_OUTPATIENT_CLINIC_OR_DEPARTMENT_OTHER): Payer: No Typology Code available for payment source | Admitting: Family

## 2021-08-02 ENCOUNTER — Other Ambulatory Visit (HOSPITAL_BASED_OUTPATIENT_CLINIC_OR_DEPARTMENT_OTHER): Payer: Self-pay

## 2021-08-02 ENCOUNTER — Encounter: Payer: Self-pay | Admitting: Family

## 2021-08-02 ENCOUNTER — Inpatient Hospital Stay: Payer: No Typology Code available for payment source | Attending: Hematology & Oncology

## 2021-08-02 ENCOUNTER — Telehealth: Payer: Self-pay | Admitting: *Deleted

## 2021-08-02 VITALS — BP 105/89 | HR 91 | Temp 98.3°F | Resp 17 | Wt 210.1 lb

## 2021-08-02 DIAGNOSIS — D508 Other iron deficiency anemias: Secondary | ICD-10-CM | POA: Diagnosis present

## 2021-08-02 DIAGNOSIS — E611 Iron deficiency: Secondary | ICD-10-CM | POA: Diagnosis not present

## 2021-08-02 DIAGNOSIS — Z9884 Bariatric surgery status: Secondary | ICD-10-CM | POA: Diagnosis not present

## 2021-08-02 DIAGNOSIS — R5383 Other fatigue: Secondary | ICD-10-CM | POA: Diagnosis not present

## 2021-08-02 DIAGNOSIS — R002 Palpitations: Secondary | ICD-10-CM | POA: Insufficient documentation

## 2021-08-02 DIAGNOSIS — K912 Postsurgical malabsorption, not elsewhere classified: Secondary | ICD-10-CM | POA: Insufficient documentation

## 2021-08-02 LAB — CBC WITH DIFFERENTIAL (CANCER CENTER ONLY)
Abs Immature Granulocytes: 0.01 10*3/uL (ref 0.00–0.07)
Basophils Absolute: 0.1 10*3/uL (ref 0.0–0.1)
Basophils Relative: 1 %
Eosinophils Absolute: 0.1 10*3/uL (ref 0.0–0.5)
Eosinophils Relative: 2 %
HCT: 42.5 % (ref 36.0–46.0)
Hemoglobin: 14.4 g/dL (ref 12.0–15.0)
Immature Granulocytes: 0 %
Lymphocytes Relative: 41 %
Lymphs Abs: 2.2 10*3/uL (ref 0.7–4.0)
MCH: 30.4 pg (ref 26.0–34.0)
MCHC: 33.9 g/dL (ref 30.0–36.0)
MCV: 89.7 fL (ref 80.0–100.0)
Monocytes Absolute: 0.5 10*3/uL (ref 0.1–1.0)
Monocytes Relative: 9 %
Neutro Abs: 2.5 10*3/uL (ref 1.7–7.7)
Neutrophils Relative %: 47 %
Platelet Count: 385 10*3/uL (ref 150–400)
RBC: 4.74 MIL/uL (ref 3.87–5.11)
RDW: 13.1 % (ref 11.5–15.5)
WBC Count: 5.4 10*3/uL (ref 4.0–10.5)
nRBC: 0 % (ref 0.0–0.2)

## 2021-08-02 LAB — IRON AND IRON BINDING CAPACITY (CC-WL,HP ONLY)
Iron: 106 ug/dL (ref 28–170)
Saturation Ratios: 26 % (ref 10.4–31.8)
TIBC: 410 ug/dL (ref 250–450)
UIBC: 304 ug/dL (ref 148–442)

## 2021-08-02 LAB — RETICULOCYTES
Immature Retic Fract: 10.3 % (ref 2.3–15.9)
RBC.: 4.69 MIL/uL (ref 3.87–5.11)
Retic Count, Absolute: 62.8 10*3/uL (ref 19.0–186.0)
Retic Ct Pct: 1.3 % (ref 0.4–3.1)

## 2021-08-02 LAB — FERRITIN: Ferritin: 50 ng/mL (ref 11–307)

## 2021-08-02 MED ORDER — ESCITALOPRAM OXALATE 10 MG PO TABS
ORAL_TABLET | Freq: Every day | ORAL | 0 refills | Status: DC
  Filled 2021-08-02: qty 90, 90d supply, fill #0

## 2021-08-02 NOTE — Telephone Encounter (Signed)
Per 08/02/21 los - called and gave upcoming appointments - confirmed

## 2021-08-02 NOTE — Progress Notes (Addendum)
Hematology and Oncology Follow Up Visit  Stacy Woods [id number] Jan 25, 1966 56 y.o. 08/02/2021   Principle Diagnosis:  Iron deficiency secondary to malabsorption - gastric bypass 2011   Current Therapy:        IV iron as indicated    Interim History:  Stacy Woods is here today for follow-up. She is symptomatic with fatigue and occasional palpitations.  No blood loss noted. No abnormal bruising or petechiae.  No fever, chills, n/v, cough, dizziness, SOB, chest pain, abdominal pain or changes in bowel or bladder habits.  No swelling, tenderness, numbness or tingling in her extremities.  No falls or syncope.  She has a good appetite and is doing her best to stay well hydrated. Her weight is stable at 210 lbs.   ECOG Performance Status: 1 - Symptomatic but completely ambulatory  Medications:  Allergies as of 08/02/2021       Reactions   Lyrica [pregabalin] Other (See Comments)   "zombie"   Percocet [oxycodone-acetaminophen] Hives, Itching   'I itched for two day and had to take Bendadyl'    Mobic [meloxicam] Swelling   Naproxen Swelling      Nsaids Swelling      Prednisone Other (See Comments)   Swelling, blood sugar problems   Shellfish Allergy Nausea Only, Swelling        Medication List        Accurate as of August 02, 2021 10:45 AM. If you have any questions, ask your nurse or doctor.          betamethasone dipropionate 0.05 % ointment Commonly known as: DIPROLENE Apply topically 2 (two) times daily to affected area(s) as needed for up to 14 days   blood glucose meter kit and supplies Dispense based on patient and insurance preference. Use up to four times daily as directed. (FOR ICD-10 E10.9, E11.9).   cyclobenzaprine 10 MG tablet Commonly known as: FLEXERIL TAKE 1 TABLET BY MOUTH THREE TIMES DAILY AS NEEDED FOR MUSCLE SPASMS   escitalopram 10 MG tablet Commonly known as: LEXAPRO TAKE 1 TABLET BY MOUTH ONCE DAILY   glucagon 1 MG injection Inject 1  mg into the muscle once as needed for up to 1 dose. For low blood sugar   hydrochlorothiazide 25 MG tablet Commonly known as: HYDRODIURIL Take 1 tablet (25 mg total) by mouth daily.   Lancets 30G Misc Check fasting blood sugar daily   Sodium Fluoride 5000 PPM 1.1 % Pste Generic drug: Sodium Fluoride Use to brush teeth for 2 minutes in the evening as directed   zolpidem 5 MG tablet Commonly known as: AMBIEN Take 1 tablet (5 mg total) by mouth at bedtime as needed for sleep. PCP to place additional refills.        Allergies:  Allergies  Allergen Reactions   Lyrica [Pregabalin] Other (See Comments)    "zombie"   Percocet [Oxycodone-Acetaminophen] Hives and Itching    'I itched for two day and had to take Bendadyl'    Mobic [Meloxicam] Swelling   Naproxen Swelling        Nsaids Swelling        Prednisone Other (See Comments)    Swelling, blood sugar problems   Shellfish Allergy Nausea Only and Swelling    Past Medical History, Surgical history, Social history, and Family History were reviewed and updated.  Review of Systems: All other 10 point review of systems is negative.   Physical Exam:  weight is 210 lb 1.9 oz (95.3 kg). Her oral  temperature is 98.3 F (36.8 C). Her blood pressure is 105/89 and her pulse is 91. Her respiration is 17 and oxygen saturation is 99%.   Wt Readings from Last 3 Encounters:  08/02/21 210 lb 1.9 oz (95.3 kg)  06/04/21 207 lb 14.4 oz (94.3 kg)  04/01/21 204 lb 12.8 oz (92.9 kg)    Ocular: Sclerae unicteric, pupils equal, round and reactive to light Ear-nose-throat: Oropharynx clear, dentition fair Lymphatic: No cervical or supraclavicular adenopathy Lungs no rales or rhonchi, good excursion bilaterally Heart regular rate and rhythm, no murmur appreciated Abd soft, nontender, positive bowel sounds MSK no focal spinal tenderness, no joint edema Neuro: non-focal, well-oriented, appropriate affect Breasts: Deferred   Lab Results   Component Value Date   WBC 5.4 08/02/2021   HGB 14.4 08/02/2021   HCT 42.5 08/02/2021   MCV 89.7 08/02/2021   PLT 385 08/02/2021   Lab Results  Component Value Date   FERRITIN 114 04/01/2021   IRON 101 04/01/2021   TIBC 393 04/01/2021   UIBC 292 04/01/2021   IRONPCTSAT 26 04/01/2021   Lab Results  Component Value Date   RETICCTPCT 1.3 08/02/2021   RBC 4.74 08/02/2021   RETICCTABS 42,750 09/26/2020   No results found for: KPAFRELGTCHN, LAMBDASER, KAPLAMBRATIO No results found for: IGGSERUM, IGA, IGMSERUM No results found for: Kathrynn Ducking, MSPIKE, SPEI   Chemistry      Component Value Date/Time   NA 138 11/19/2020 0800   NA 138 05/09/2016 1332   K 3.5 11/19/2020 0800   K 3.6 05/09/2016 1332   CL 106 11/19/2020 0800   CO2 22 11/19/2020 0800   CO2 20 (L) 05/09/2016 1332   BUN 10 11/19/2020 0800   BUN 12.2 05/09/2016 1332   CREATININE 1.02 (H) 11/19/2020 0800   CREATININE 1.01 (H) 07/24/2020 1051   CREATININE 0.97 06/20/2020 0000   CREATININE 0.9 05/09/2016 1332      Component Value Date/Time   CALCIUM 9.2 11/19/2020 0800   CALCIUM 9.1 05/09/2016 1332   ALKPHOS 105 07/24/2020 1051   ALKPHOS 121 05/09/2016 1332   AST 26 07/24/2020 1051   AST 34 05/09/2016 1332   ALT 23 07/24/2020 1051   ALT 28 05/09/2016 1332   BILITOT 0.7 07/24/2020 1051   BILITOT 0.47 05/09/2016 1332       Impression and Plan: Stacy Woods is a very pleasant 56 yo caucasian female with history of symptomatic iron deficiency secondary to malabsorption s/p gastric bypass in June 2011. Iron studies are pending. We will replace if needed.  Follow-up in 6 months.   Lottie Dawson, NP 2/24/202310:45 AM

## 2021-08-05 ENCOUNTER — Encounter: Payer: Self-pay | Admitting: Medical-Surgical

## 2021-08-05 ENCOUNTER — Other Ambulatory Visit (HOSPITAL_BASED_OUTPATIENT_CLINIC_OR_DEPARTMENT_OTHER): Payer: Self-pay

## 2021-08-05 ENCOUNTER — Encounter: Payer: Self-pay | Admitting: Family

## 2021-08-05 ENCOUNTER — Ambulatory Visit (INDEPENDENT_AMBULATORY_CARE_PROVIDER_SITE_OTHER): Payer: No Typology Code available for payment source | Admitting: Medical-Surgical

## 2021-08-05 ENCOUNTER — Other Ambulatory Visit: Payer: Self-pay

## 2021-08-05 VITALS — BP 117/84 | HR 81 | Resp 20 | Ht 62.0 in | Wt 209.5 lb

## 2021-08-05 DIAGNOSIS — I10 Essential (primary) hypertension: Secondary | ICD-10-CM

## 2021-08-05 DIAGNOSIS — R600 Localized edema: Secondary | ICD-10-CM

## 2021-08-05 MED ORDER — OZEMPIC (0.25 OR 0.5 MG/DOSE) 2 MG/1.5ML ~~LOC~~ SOPN
0.2500 mg | PEN_INJECTOR | SUBCUTANEOUS | 0 refills | Status: DC
Start: 2021-08-05 — End: 2021-09-26
  Filled 2021-08-05: qty 1.5, 42d supply, fill #0

## 2021-08-05 MED ORDER — METOPROLOL SUCCINATE ER 25 MG PO TB24
25.0000 mg | ORAL_TABLET | Freq: Every day | ORAL | 3 refills | Status: DC
  Filled 2021-08-05: qty 90, 90d supply, fill #0

## 2021-08-05 NOTE — Progress Notes (Signed)
°  HPI with pertinent ROS:   CC: leg swelling  HPI: Pleasant 56 year old female presenting today with concerns for bilateral upper leg/thigh swelling. Notes that this started late on Friday evening (about 3 days ago). Can feel the fluid in her legs when she walks. No swelling of the lower legs or feet. No chest pain, shortness of breath, or activity intolerance. Following a low sodium diet. Unable to determine any alleviating or aggravating factors. Notes that the only change that she has made in the last 2 weeks was the switch in her BP medication from Lisinopril-HCTZ to a higher dose of HCTZ (25mg ) daily. No previous intolerance or allergy to the medication although she notes that her father has a severe sulfa drug allergy.   I reviewed the past medical history, family history, social history, surgical history, and allergies today and no changes were needed.  Please see the problem list section below in epic for further details.  Physical exam:   General: Well Developed, well nourished, and in no acute distress.  Neuro: Alert and oriented x3.  HEENT: Normocephalic, atraumatic.  Skin: Warm and dry. Cardiac: Regular rate and rhythm, no murmurs rubs or gallops, + nonpitting edema of the bilateral thighs.  Respiratory: Clear to auscultation bilaterally. Not using accessory muscles, speaking in full sentences.  Impression and Recommendations:    1. Bilateral lower extremity edema 2. Essential hypertension, benign Unclear etiology. Unusual presentation of thigh edema without affecting the lower legs or feet. Labs checked at Hematology without concerning findings. As the only change, consider possible intolerance or allergy to HCTZ although this would be a strange presentation. Discontinue HCTZ. Start Toprol-XL 25mg  daily to help with BP and potentially stress/anxiety/palpitations.   Follow up: keep appointment as scheduled on 08/20/2021 ___________________________________________ Clearnce Sorrel,  DNP, APRN, FNP-BC [facility] [place]

## 2021-08-06 ENCOUNTER — Other Ambulatory Visit (HOSPITAL_BASED_OUTPATIENT_CLINIC_OR_DEPARTMENT_OTHER): Payer: Self-pay

## 2021-08-07 ENCOUNTER — Other Ambulatory Visit (HOSPITAL_BASED_OUTPATIENT_CLINIC_OR_DEPARTMENT_OTHER): Payer: Self-pay

## 2021-08-08 ENCOUNTER — Other Ambulatory Visit (HOSPITAL_BASED_OUTPATIENT_CLINIC_OR_DEPARTMENT_OTHER): Payer: Self-pay

## 2021-08-09 ENCOUNTER — Ambulatory Visit: Payer: No Typology Code available for payment source

## 2021-08-09 ENCOUNTER — Other Ambulatory Visit (HOSPITAL_BASED_OUTPATIENT_CLINIC_OR_DEPARTMENT_OTHER): Payer: Self-pay

## 2021-08-12 ENCOUNTER — Other Ambulatory Visit (HOSPITAL_BASED_OUTPATIENT_CLINIC_OR_DEPARTMENT_OTHER): Payer: Self-pay

## 2021-08-13 ENCOUNTER — Ambulatory Visit (INDEPENDENT_AMBULATORY_CARE_PROVIDER_SITE_OTHER): Payer: No Typology Code available for payment source

## 2021-08-13 ENCOUNTER — Encounter: Payer: Self-pay | Admitting: Medical-Surgical

## 2021-08-13 ENCOUNTER — Other Ambulatory Visit: Payer: Self-pay

## 2021-08-13 ENCOUNTER — Other Ambulatory Visit (HOSPITAL_BASED_OUTPATIENT_CLINIC_OR_DEPARTMENT_OTHER): Payer: Self-pay

## 2021-08-13 DIAGNOSIS — Z4802 Encounter for removal of sutures: Secondary | ICD-10-CM

## 2021-08-13 DIAGNOSIS — R600 Localized edema: Secondary | ICD-10-CM

## 2021-08-13 NOTE — Progress Notes (Signed)
Gave pt path report, and we removed 6 sutures from 3 lesions no signs or symptoms of infection.  ?

## 2021-08-14 ENCOUNTER — Other Ambulatory Visit (HOSPITAL_BASED_OUTPATIENT_CLINIC_OR_DEPARTMENT_OTHER): Payer: Self-pay

## 2021-08-14 ENCOUNTER — Other Ambulatory Visit: Payer: Self-pay | Admitting: Medical-Surgical

## 2021-08-14 MED ORDER — POTASSIUM CHLORIDE CRYS ER 20 MEQ PO TBCR: 20.0000 meq | EXTENDED_RELEASE_TABLET | Freq: Every day | ORAL | 0 refills | Status: DC

## 2021-08-14 MED ORDER — FUROSEMIDE 20 MG PO TABS: ORAL_TABLET | ORAL | 3 refills | Status: DC

## 2021-08-14 NOTE — Addendum Note (Signed)
Addended bySamuel Bouche on: 08/14/2021 09:55 PM ? ? Modules accepted: Orders ? ?

## 2021-08-15 ENCOUNTER — Ambulatory Visit (INDEPENDENT_AMBULATORY_CARE_PROVIDER_SITE_OTHER): Payer: No Typology Code available for payment source

## 2021-08-15 ENCOUNTER — Other Ambulatory Visit (HOSPITAL_BASED_OUTPATIENT_CLINIC_OR_DEPARTMENT_OTHER): Payer: Self-pay

## 2021-08-15 ENCOUNTER — Other Ambulatory Visit: Payer: Self-pay

## 2021-08-15 DIAGNOSIS — R600 Localized edema: Secondary | ICD-10-CM

## 2021-08-15 LAB — COMPLETE METABOLIC PANEL WITH GFR
AG Ratio: 1.8 (calc) (ref 1.0–2.5)
ALT: 38 U/L — ABNORMAL HIGH (ref 6–29)
AST: 36 U/L — ABNORMAL HIGH (ref 10–35)
Albumin: 4.3 g/dL (ref 3.6–5.1)
Alkaline phosphatase (APISO): 100 U/L (ref 37–153)
BUN/Creatinine Ratio: 10 (calc) (ref 6–22)
BUN: 11 mg/dL (ref 7–25)
CO2: 22 mmol/L (ref 20–32)
Calcium: 9.2 mg/dL (ref 8.6–10.4)
Chloride: 111 mmol/L — ABNORMAL HIGH (ref 98–110)
Creat: 1.07 mg/dL — ABNORMAL HIGH (ref 0.50–1.03)
Globulin: 2.4 g/dL (calc) (ref 1.9–3.7)
Glucose, Bld: 61 mg/dL — ABNORMAL LOW (ref 65–139)
Potassium: 3.2 mmol/L — ABNORMAL LOW (ref 3.5–5.3)
Sodium: 142 mmol/L (ref 135–146)
Total Bilirubin: 0.5 mg/dL (ref 0.2–1.2)
Total Protein: 6.7 g/dL (ref 6.1–8.1)
eGFR: 62 mL/min/{1.73_m2} (ref >=60)

## 2021-08-15 LAB — BRAIN NATRIURETIC PEPTIDE: Brain Natriuretic Peptide: 147 pg/mL — ABNORMAL HIGH (ref <100)

## 2021-08-15 LAB — TSH: TSH: 4.94 mIU/L — ABNORMAL HIGH

## 2021-08-15 LAB — T4, FREE: Free T4: 0.9 ng/dL (ref 0.8–1.8)

## 2021-08-16 ENCOUNTER — Other Ambulatory Visit (HOSPITAL_BASED_OUTPATIENT_CLINIC_OR_DEPARTMENT_OTHER): Payer: Self-pay

## 2021-08-16 ENCOUNTER — Other Ambulatory Visit: Payer: Self-pay | Admitting: Medical-Surgical

## 2021-08-16 DIAGNOSIS — R600 Localized edema: Secondary | ICD-10-CM

## 2021-08-16 DIAGNOSIS — R7989 Other specified abnormal findings of blood chemistry: Secondary | ICD-10-CM

## 2021-08-16 DIAGNOSIS — R9341 Abnormal radiologic findings on diagnostic imaging of renal pelvis, ureter, or bladder: Secondary | ICD-10-CM

## 2021-08-19 ENCOUNTER — Other Ambulatory Visit (HOSPITAL_BASED_OUTPATIENT_CLINIC_OR_DEPARTMENT_OTHER): Payer: Self-pay

## 2021-08-19 NOTE — Progress Notes (Unsigned)
Virtual Visit via Video Note  I connected with Stacy Woods on 08/19/21 at  9:30 AM EDT by a video enabled telemedicine application and verified that I am speaking with the correct person using two identifiers.   I discussed the limitations of evaluation and management by telemedicine and the availability of in person appointments. The patient expressed understanding and agreed to proceed.  Patient location: home Provider locations: office  Subjective:    CC:   HPI:    Past medical history, Surgical history, Family history not pertinant except as noted below, Social history, Allergies, and medications have been entered into the medical record, reviewed, and corrections made.   Review of Systems: See HPI for pertinent positives and negatives.   Objective:    General: Speaking clearly in complete sentences without any shortness of breath.  Alert and oriented x3.  Normal judgment. No apparent acute distress.  Impression and Recommendations:    No problem-specific Assessment & Plan notes found for this encounter.   I discussed the assessment and treatment plan with the patient. The patient was provided an opportunity to ask questions and all were answered. The patient agreed with the plan and demonstrated an understanding of the instructions.   The patient was advised to call back or seek an in-person evaluation if the symptoms worsen or if the condition fails to improve as anticipated.  *** minutes of non-face-to-face time was provided during this encounter.  No follow-ups on file.  Clearnce Sorrel, DNP, APRN, FNP-BC [facility]

## 2021-08-20 ENCOUNTER — Telehealth (INDEPENDENT_AMBULATORY_CARE_PROVIDER_SITE_OTHER): Payer: No Typology Code available for payment source | Admitting: Medical-Surgical

## 2021-08-20 ENCOUNTER — Encounter: Payer: Self-pay | Admitting: Medical-Surgical

## 2021-08-20 ENCOUNTER — Other Ambulatory Visit (HOSPITAL_BASED_OUTPATIENT_CLINIC_OR_DEPARTMENT_OTHER): Payer: Self-pay

## 2021-08-20 VITALS — Resp 16 | Ht 62.0 in | Wt 210.0 lb

## 2021-08-20 DIAGNOSIS — E6609 Other obesity due to excess calories: Secondary | ICD-10-CM

## 2021-08-20 DIAGNOSIS — M7989 Other specified soft tissue disorders: Secondary | ICD-10-CM

## 2021-08-20 LAB — URINALYSIS, ROUTINE W REFLEX MICROSCOPIC
Bilirubin Urine: NEGATIVE
Glucose, UA: NEGATIVE
Hgb urine dipstick: NEGATIVE
Ketones, ur: NEGATIVE
Nitrite: NEGATIVE
Protein, ur: NEGATIVE
RBC / HPF: NONE SEEN /HPF (ref 0–2)
Specific Gravity, Urine: 1.014 (ref 1.001–1.035)
pH: 5.5 (ref 5.0–8.0)

## 2021-08-20 LAB — MICROSCOPIC MESSAGE

## 2021-08-20 MED ORDER — NITROFURANTOIN MONOHYD MACRO 100 MG PO CAPS: 100.0000 mg | ORAL_CAPSULE | Freq: Two times a day (BID) | ORAL | 0 refills | Status: DC

## 2021-08-21 ENCOUNTER — Other Ambulatory Visit (HOSPITAL_BASED_OUTPATIENT_CLINIC_OR_DEPARTMENT_OTHER): Payer: Self-pay

## 2021-08-21 ENCOUNTER — Ambulatory Visit (HOSPITAL_COMMUNITY): Payer: No Typology Code available for payment source | Attending: Cardiovascular Disease

## 2021-08-21 ENCOUNTER — Other Ambulatory Visit: Payer: Self-pay

## 2021-08-21 DIAGNOSIS — R600 Localized edema: Secondary | ICD-10-CM | POA: Insufficient documentation

## 2021-08-21 DIAGNOSIS — R7989 Other specified abnormal findings of blood chemistry: Secondary | ICD-10-CM | POA: Insufficient documentation

## 2021-08-22 ENCOUNTER — Ambulatory Visit (INDEPENDENT_AMBULATORY_CARE_PROVIDER_SITE_OTHER): Payer: No Typology Code available for payment source

## 2021-08-22 ENCOUNTER — Other Ambulatory Visit (HOSPITAL_BASED_OUTPATIENT_CLINIC_OR_DEPARTMENT_OTHER): Payer: Self-pay

## 2021-08-22 DIAGNOSIS — M7989 Other specified soft tissue disorders: Secondary | ICD-10-CM | POA: Diagnosis not present

## 2021-08-22 LAB — ECHOCARDIOGRAM COMPLETE
Area-P 1/2: 3.56 cm2
S' Lateral: 2.5 cm

## 2021-08-23 ENCOUNTER — Other Ambulatory Visit (HOSPITAL_BASED_OUTPATIENT_CLINIC_OR_DEPARTMENT_OTHER): Payer: Self-pay

## 2021-08-23 LAB — BASIC METABOLIC PANEL WITH GFR
BUN: 11 mg/dL (ref 7–25)
CO2: 24 mmol/L (ref 20–32)
Calcium: 9.4 mg/dL (ref 8.6–10.4)
Chloride: 107 mmol/L (ref 98–110)
Creat: 0.86 mg/dL (ref 0.50–1.03)
Glucose, Bld: 80 mg/dL (ref 65–139)
Potassium: 4.2 mmol/L (ref 3.5–5.3)
Sodium: 142 mmol/L (ref 135–146)
eGFR: 81 mL/min/{1.73_m2} (ref >=60)

## 2021-08-26 ENCOUNTER — Other Ambulatory Visit (HOSPITAL_BASED_OUTPATIENT_CLINIC_OR_DEPARTMENT_OTHER): Payer: Self-pay

## 2021-08-27 ENCOUNTER — Encounter: Payer: Self-pay | Admitting: Medical-Surgical

## 2021-08-27 ENCOUNTER — Other Ambulatory Visit (HOSPITAL_BASED_OUTPATIENT_CLINIC_OR_DEPARTMENT_OTHER): Payer: Self-pay

## 2021-08-27 ENCOUNTER — Telehealth: Payer: Self-pay

## 2021-08-27 NOTE — Telephone Encounter (Signed)
Initiated Prior authorization BCW:[id number] (0.25 or 0.5 MG/DOSE) '2MG'$ /1.5ML pen-injectors ?Via: Covermymeds ?Case/Key:[id number] ?Status: denied as of 08/27/21 ?Reason previously denied ?Notified Pt via: Mychart ?

## 2021-08-28 ENCOUNTER — Other Ambulatory Visit (HOSPITAL_BASED_OUTPATIENT_CLINIC_OR_DEPARTMENT_OTHER): Payer: Self-pay

## 2021-08-29 ENCOUNTER — Other Ambulatory Visit (HOSPITAL_BASED_OUTPATIENT_CLINIC_OR_DEPARTMENT_OTHER): Payer: Self-pay

## 2021-08-30 ENCOUNTER — Other Ambulatory Visit (HOSPITAL_BASED_OUTPATIENT_CLINIC_OR_DEPARTMENT_OTHER): Payer: Self-pay

## 2021-09-02 ENCOUNTER — Other Ambulatory Visit (HOSPITAL_BASED_OUTPATIENT_CLINIC_OR_DEPARTMENT_OTHER): Payer: Self-pay

## 2021-09-03 ENCOUNTER — Other Ambulatory Visit (HOSPITAL_BASED_OUTPATIENT_CLINIC_OR_DEPARTMENT_OTHER): Payer: Self-pay

## 2021-09-04 ENCOUNTER — Other Ambulatory Visit (HOSPITAL_BASED_OUTPATIENT_CLINIC_OR_DEPARTMENT_OTHER): Payer: Self-pay

## 2021-09-05 ENCOUNTER — Other Ambulatory Visit (HOSPITAL_BASED_OUTPATIENT_CLINIC_OR_DEPARTMENT_OTHER): Payer: Self-pay

## 2021-09-05 MED ORDER — PREDNISONE 10 MG PO TABS
ORAL_TABLET | ORAL | 0 refills | Status: DC
  Filled 2021-09-05: qty 21, 6d supply, fill #0

## 2021-09-06 ENCOUNTER — Other Ambulatory Visit (HOSPITAL_BASED_OUTPATIENT_CLINIC_OR_DEPARTMENT_OTHER): Payer: Self-pay

## 2021-09-06 ENCOUNTER — Other Ambulatory Visit: Payer: Self-pay | Admitting: Medical-Surgical

## 2021-09-06 ENCOUNTER — Encounter: Payer: Self-pay | Admitting: Medical-Surgical

## 2021-09-06 MED ORDER — METOPROLOL SUCCINATE ER 50 MG PO TB24
25.0000 mg | ORAL_TABLET | Freq: Every day | ORAL | 1 refills | Status: DC
  Filled 2021-09-06: qty 45, 90d supply, fill #0

## 2021-09-14 ENCOUNTER — Emergency Department (HOSPITAL_BASED_OUTPATIENT_CLINIC_OR_DEPARTMENT_OTHER)
Admission: EM | Admit: 2021-09-14 | Discharge: 2021-09-14 | Disposition: A | Discharge status: Home / Self Care | Payer: No Typology Code available for payment source | Attending: Emergency Medicine | Admitting: Emergency Medicine

## 2021-09-14 ENCOUNTER — Encounter (HOSPITAL_BASED_OUTPATIENT_CLINIC_OR_DEPARTMENT_OTHER): Payer: Self-pay

## 2021-09-14 ENCOUNTER — Other Ambulatory Visit: Payer: Self-pay

## 2021-09-14 ENCOUNTER — Emergency Department (HOSPITAL_BASED_OUTPATIENT_CLINIC_OR_DEPARTMENT_OTHER): Payer: No Typology Code available for payment source

## 2021-09-14 DIAGNOSIS — W010XXA Fall on same level from slipping, tripping and stumbling without subsequent striking against object, initial encounter: Secondary | ICD-10-CM | POA: Insufficient documentation

## 2021-09-14 DIAGNOSIS — Y9301 Activity, walking, marching and hiking: Secondary | ICD-10-CM | POA: Insufficient documentation

## 2021-09-14 DIAGNOSIS — S62115A Nondisplaced fracture of triquetrum [cuneiform] bone, left wrist, initial encounter for closed fracture: Secondary | ICD-10-CM | POA: Diagnosis not present

## 2021-09-14 DIAGNOSIS — S6992XA Unspecified injury of left wrist, hand and finger(s), initial encounter: Secondary | ICD-10-CM | POA: Diagnosis present

## 2021-09-14 NOTE — ED Provider Notes (Signed)
?[facility] ?Provider Note ? ? ?CSN: [id number] ?Arrival date & time: 09/14/21  1641 ? ?  ? ?History ? ?No chief complaint on file. ? ? ?Stacy Woods is a 56 y.o. female who presents to the ED for evaluation of a left hand injury that occurred while walking on a slick wooden sidewalk earlier this afternoon.  Patient states that she fell and caught her self with her left wrist outstretched in dorsiflexion.  Patient noticed that her wrist seemed extremely tender and sore, especially when she was driving.  She has mild tingling of the left middle and medial ring finger.  She has been icing, however there is no other treatment prior to arrival.  She has no other complaints. ? ?HPI ? ?  ? ?Home Medications ?Prior to Admission medications   ?Medication Sig Start Date End Date Taking? Authorizing Provider  ?blood glucose meter kit and supplies Dispense based on patient and insurance preference. Use up to four times daily as directed. (FOR ICD-10 E10.9, E11.9). 08/31/19   [name], DO  ?cyclobenzaprine (FLEXERIL) 10 MG tablet TAKE 1 TABLET BY MOUTH THREE TIMES DAILY AS NEEDED FOR MUSCLE SPASMS 06/04/21   Samuel Bouche, NP  ?escitalopram (LEXAPRO) 10 MG tablet TAKE 1 TABLET BY MOUTH ONCE DAILY 08/02/21 08/02/22  Samuel Bouche, NP  ?furosemide (LASIX) 20 MG tablet Take 29m daily for 3 days then 219mdaily for 4 days. 08/14/21   JeSamuel BoucheNP  ?glucagon 1 MG injection Inject 1 mg into the muscle once as needed for up to 1 dose. For low blood sugar 11/12/18   CoGregor HamsMD  ?Lancets 30G MISC Check fasting blood sugar daily 02/06/17   CoGregor HamsMD  ?metoprolol succinate (TOPROL-XL) 50 MG 24 hr tablet Take 1/2 tablet (25 mg total) by mouth daily. 09/06/21   JeSamuel BoucheNP  ?nitrofurantoin, macrocrystal-monohydrate, (MACROBID) 100 MG capsule Take 1 capsule (100 mg total) by mouth 2 (two) times daily. 08/20/21   JeSamuel BoucheNP  ?potassium chloride SA (KLOR-CON M) 20 MEQ tablet Take 1 tablet (20  mEq total) by mouth daily. 08/14/21   JeSamuel BoucheNP  ?predniSONE (DELTASONE) 10 MG tablet Take 6 tablets by mouth on day 1, then 5 tablets on day 2, then 4 tablets on day 3, then 3 tablets on day 4, then 2 tablets on day 5, then 1 tablet on day 6 09/05/21   McGentry FitzMD  ?Semaglutide,0.25 or 0.5MG/DOS, (OZEMPIC, 0.25 OR 0.5 MG/DOSE,) 2 MG/1.5ML SOPN Inject 0.25 mg into the skin once a week. ?Patient not taking: Reported on 08/20/2021 08/05/21   JeSamuel BoucheNP  ?Sodium Fluoride 1.1 % PSTE Use to brush teeth for 2 minutes in the evening as directed 12/14/20     ?zolpidem (AMBIEN) 5 MG tablet Take 1 tablet (5 mg total) by mouth at bedtime as needed for sleep. PCP to place additional refills. 05/06/21   JeSamuel BoucheNP  ?   ? ?Allergies    ?Lyrica [pregabalin], Percocet [oxycodone-acetaminophen], Mobic [meloxicam], Naproxen, Nsaids, Prednisone, and Shellfish allergy   ? ?Review of Systems   ?Review of Systems ? ?Physical Exam ?Updated Vital Signs ?BP 126/90 (BP Location: Left Arm)   Pulse 64   Temp 98.1 ?F (36.7 ?C) (Oral)   Resp 18   Ht 5' 4"  (1.626 m)   Wt 94.3 kg   SpO2 100%   BMI 35.70 kg/m?  ?Physical Exam ?Vitals and nursing note reviewed.  ?Constitutional:   ?  General: She is not in acute distress. ?   Appearance: She is not ill-appearing.  ?HENT:  ?   Head: Atraumatic.  ?Eyes:  ?   Conjunctiva/sclera: Conjunctivae normal.  ?Cardiovascular:  ?   Rate and Rhythm: Normal rate and regular rhythm.  ?   Pulses: Normal pulses.     ?     Radial pulses are 2+ on the right side and 2+ on the left side.  ?   Heart sounds: No murmur heard. ?Pulmonary:  ?   Effort: Pulmonary effort is normal. No respiratory distress.  ?   Breath sounds: Normal breath sounds.  ?Abdominal:  ?   General: Abdomen is flat. There is no distension.  ?   Palpations: Abdomen is soft.  ?   Tenderness: There is no abdominal tenderness.  ?Musculoskeletal:     ?   General: Normal range of motion.  ?     Arms: ? ?   Cervical back: Normal  range of motion.  ?   Comments: Left wrist without significant swelling or bruising, although patient has been icing for some time at time of my evaluation.  Focal tenderness over the carpal bones, particularly near the radiocarpal joint ? ?Good thumb to finger opposition, ROM on dorsiflexion plantarflexion intact although mildly limited due to pain.  Neurovascularly intact.  ?Skin: ?   General: Skin is warm and dry.  ?   Capillary Refill: Capillary refill takes less than 2 seconds.  ?Neurological:  ?   General: No focal deficit present.  ?   Mental Status: She is alert.  ?Psychiatric:     ?   Mood and Affect: Mood normal.  ? ? ?ED Results / Procedures / Treatments   ?Labs ?(all labs ordered are listed, but only abnormal results are displayed) ?Labs Reviewed - No data to display ? ?EKG ?None ? ?Radiology ?DG Wrist Complete Left ? ?Result Date: 09/14/2021 ?CLINICAL DATA:  wrist pain after fall EXAM: LEFT WRIST - COMPLETE 3+ VIEW COMPARISON:  Radiograph dated July 31, 2016 FINDINGS: Osteopenia. There is a small fleck of bone on the dorsal aspect of the rest which likely reflects a triquetral fracture. No additional fracture noted. Soft tissue edema. IMPRESSION: Small fleck of bone visualized in the dorsal aspect of the wrist most consistent with a triquetral fracture. Electronically Signed   By: Valentino Saxon M.D.   On: 09/14/2021 17:52   ? ?Procedures ?Procedures  ? ? ?Medications Ordered in ED ?Medications - No data to display ? ?ED Course/ Medical Decision Making/ A&P ?  ?                        ?Medical Decision Making ?Amount and/or Complexity of Data Reviewed ?Radiology: ordered. ? ? ?History:  ?Per HPI ?Social determinants of health: none ? ?Initial impression: ? ?This patient presents to the ED for concern of fusion injury of left wrist, this involves an extensive number of treatment options, and is a complaint that carries with it a high risk of complications and morbidity.    ? ? ?Imaging Studies  ordered: ? ?I ordered imaging studies including  ?Left wrist x-ray with small dorsal triquetrum bone fracture ?I independently visualized and interpreted imaging and I agree with the radiologist interpretation.  ? ? ? ?ED Course: ?56 year old female in no acute distress, nontoxic-appearing presents to the ED for evaluation of the left [place] injury.  Physical exam without acute swelling although she does have focal tenderness  of the left radiocarpal joint, worse on the dorsum.  Neurovascularly intact.  X-ray of the wrist identifies mild fracture of the triquetrum bone.  Short arm splint with thumb free and placed in ED.  Orthopedic referral placed. ? ?Disposition: ? ?After consideration of the diagnostic results, physical exam, history and the patients response to treatment feel that the patent would benefit from discharge.   ?Nondisplaced fracture of the cuneiform bone, left wrist: Management and plan as described above.  RICE protocol indicated.  Return precautions were discussed.  Patient is to call orthopedics when they open neck up on Monday to schedule follow-up appointment.  She expresses understanding and is amenable to plan.  Discharged home in good condition. ? ?Final Clinical Impression(s) / ED Diagnoses ?Final diagnoses:  ?Nondisplaced fracture of triquetrum (cuneiform) bone, left wrist, initial encounter for closed fracture  ? ? ?Rx / DC Orders ?ED Discharge Orders   ? ? None  ? ?  ? ? ?  ?Tonye Pearson, [place] ?09/14/21 1928 ? ?  ?Hayden Rasmussen, MD ?09/15/21 [phone] ? ?

## 2021-09-14 NOTE — Discharge Instructions (Addendum)
You have a tiny fracture in one of your carpal bones of your left wrist.  Please place you in a splint here in the emergency department, but you need to follow-up with orthopedics next week as you may need a more long-term splint.  Your injury is nonsurgical and will likely require immobilization for approximately 4 to 6 weeks.  Continue to elevate the limb, apply ice for 20 minutes on 20 minutes off for the next couple of days.  You can take Tylenol or Motrin as needed for your pain. ?

## 2021-09-14 NOTE — ED Triage Notes (Signed)
Slipped on a wooden sidewalk today, landing on left palm of hand. C/o pain from elbow down to fingers. Pulses intact. ?

## 2021-09-14 NOTE — ED Notes (Signed)
Report received and care resumed. ? ?Rounded on pt. Pt currently lying in bed. No signs of distress noted. Will continue to monitor  ?

## 2021-09-16 ENCOUNTER — Telehealth: Payer: Self-pay | Admitting: Orthopedic Surgery

## 2021-09-16 NOTE — Telephone Encounter (Signed)
Set an appt for this new pt for [place]. Pt had left hand fracture and was seen at [facility]. If Dr. Tempie Donning check her chart. I he feels to see pt sooner call pt at [phone]. If he want to see her sooner open new slot. Pt phone number is  ?

## 2021-09-19 ENCOUNTER — Encounter: Payer: Self-pay | Admitting: Medical-Surgical

## 2021-09-24 ENCOUNTER — Ambulatory Visit (INDEPENDENT_AMBULATORY_CARE_PROVIDER_SITE_OTHER): Payer: No Typology Code available for payment source | Admitting: Orthopedic Surgery

## 2021-09-24 ENCOUNTER — Encounter: Payer: Self-pay | Admitting: Orthopedic Surgery

## 2021-09-24 DIAGNOSIS — S62102A Fracture of unspecified carpal bone, left wrist, initial encounter for closed fracture: Secondary | ICD-10-CM | POA: Diagnosis not present

## 2021-09-24 NOTE — Progress Notes (Signed)
? ?Office Visit Note ?  ?Patient: Stacy Woods           ?Date of Birth: 1966-03-19           ?MRN: [id number] ?Visit Date: 09/24/2021 ?             ?Requested by: Samuel Bouche, NP ?[address] ?[address] ?[place],  [place] [address] ?PCP: Samuel Bouche, NP ? ? ?Assessment & Plan: ?Visit Diagnoses:  ?1. Avulsion fracture of left wrist   ? ? ?Plan: I reviewed x-rays of left wrist with patient.  They demonstrate what appears to be an avulsion fracture from the dorsal aspect of the triquetrum.  I do not see any additional fractures or evidence of bony injury.  We will transition her from her plaster splint to a removable wrist brace today.  She can take oral anti-inflammatory medications as needed.  I can see her back in a few weeks if she still symptomatic. ? ?Follow-Up Instructions: No follow-ups on file.  ? ?Orders:  ?No orders of the defined types were placed in this encounter. ? ?No orders of the defined types were placed in this encounter. ? ? ? ? Procedures: ?Splinting ? ?Date/Time: 09/24/2021 10:34 AM ?Performed by: Sherilyn Cooter, MD ?Authorized by: Sherilyn Cooter, MD  ? ?Consent Given by:  Patient ?Site marked: the procedure site was marked   ?Timeout: prior to procedure the correct patient, procedure, and site was verified   ?Location:  Wrist ? left wrist ?Fracture Type: triquetral   ?Neurovascularly intact   ?Distal Perfusion: normal   ?Distal Sensation: normal   ?Manipulation Performed?: No   ?Immobilization:  Brace ?Is this the patient's first splint for this injury?: No   ?Splint/Brace Type:  Wrist velcro ?Neurovascularly intact   ?Distal Perfusion: normal   ?Distal Sensation: normal   ?Patient tolerance:  Patient tolerated the procedure well with no immediate complications ? ? ?Clinical Data: ?No additional findings. ? ? ?Subjective: ?Chief Complaint  ?Patient presents with  ? Left Wrist - Fracture  ?  DOI: 09/14/21, RIGHT Handed, Pain: 5/10, was walking and tripped and fell on it.  ? ? ?This is a  56 year old right-hand-dominant female who presents with pain to the dorsal aspect the left wrist.  This started approximate 2 weeks ago when she was walking on a wet wooden boardwalk when she slipped and fell.  She thinks landed on her left wrist.  She noted left wrist pain later that day when she was driving home.  Today she describes her pain as dull and aching in nature at the dorsal and ulnar aspect of the wrist.  The pain is 5/10 at worst.  Her pain is worse with terminal extension of the wrist.  She denies pain elsewhere in her fingers or the remainder of her hand and wrist. ? ? ?Review of Systems ? ? ?Objective: ?Vital Signs: BP 113/77 (BP Location: Left Arm, Patient Position: Sitting)   Pulse 87   Ht '5\' 4"'$  (1.626 m)   Wt 207 lb 14.3 oz (94.3 kg)   BMI 35.68 kg/m?  ? ?Physical Exam ?Constitutional:   ?   Appearance: Normal appearance.  ?Cardiovascular:  ?   Rate and Rhythm: Normal rate.  ?   Pulses: Normal pulses.  ?Pulmonary:  ?   Effort: Pulmonary effort is normal.  ?Skin: ?   General: Skin is warm and dry.  ?   Capillary Refill: Capillary refill takes less than 2 seconds.  ?Neurological:  ?  Mental Status: She is alert.  ? ? ?Right Hand Exam  ? ?Tenderness  ?Right hand tenderness location: TTP at dorsal and ulnar aspect of wrist.  Mild to moderate dorsal wrist swelling. ? ?Other  ?Erythema: absent ?Sensation: normal ?Pulse: present ? ?Comments:  Wrist ROM limited by dorsal pain.  No pain w/ full ROM of fingers.  TTP only at the dorsal and ulnar aspect of hand around triquetrum.  ? ? ? ? ?Specialty Comments:  ?No specialty comments available. ? ?Imaging: ?No results found. ? ? ?PMFS History: ?Patient Active Problem List  ? Diagnosis Date Noted  ? Avulsion fracture of left wrist 09/24/2021  ? Chondromalacia patellae, left knee 11/23/2020  ? Osteoarthritis of left knee 11/23/2020  ? Cyst of lateral meniscus of left knee 11/23/2020  ? Morning joint stiffness 08/28/2019  ? Constipation 08/28/2019  ? Right  shoulder pain 07/27/2019  ? Glaucoma 07/21/2019  ? Transaminitis 12/01/2017  ? MDD (recurrent major depressive disorder) in remission ([place]) 11/03/2017  ? AK (actinic keratosis) 07/14/2017  ? Low magnesium level 01/12/2017  ? B12 deficiency 01/01/2017  ? Trochanteric bursitis of left hip 10/08/2016  ? Malabsorption of iron 05/09/2016  ? Arthritis of knee, degenerative 04/14/2016  ? Surgical menopause 03/24/2016  ? Family history of breast cancer 03/24/2016  ? Primary osteoarthritis of both knees 10/01/2015  ? Hypoglycemia 06/12/2015  ? Cubital tunnel syndrome on right 09/14/2014  ? Degenerative disc disease, cervical 07/27/2014  ? Fibromyalgia 04/11/2013  ? Essential hypertension, benign 04/11/2013  ? Anxiety state 04/11/2013  ? Iron deficiency anemia 09/04/2011  ? Palpitations 09/04/2011  ? History of gastric bypass 09/03/2011  ? Insomnia 08/16/2011  ? Osteoarthrosis, unspecified whether generalized or localized, lower leg 08/16/2011  ? Myalgia and myositis, unspecified 08/16/2011  ? Acquired absence of organ, genital organs 08/16/2011  ? Irritable bowel syndrome 08/16/2011  ? Seborrheic dermatitis 08/16/2011  ? ?Past Medical History:  ?Diagnosis Date  ? AK (actinic keratosis) 07/14/2017  ? Right upper chest wall  ? Anemia   ? Anemia, iron deficiency 05/02/2015  ? Fibromyalgia   ? History of gastric bypass 05/09/2016  ? Hypertension   ? Insomnia 08/14/2017  ? Malabsorption of iron 05/09/2016  ? PONV (postoperative nausea and vomiting)   ? Prediabetes 09/01/2013  ? March 2015   ? Seizure ([place]) 2018  ? from low blood sugar   ? Surgical menopause 03/24/2016  ?  ?Family History  ?Problem Relation Age of Onset  ? Diabetes Father   ? Hypertension Father   ? Stroke Father   ? Heart failure Father   ? CAD Father   ?     has stents  ? Breast cancer Mother   ? Migraines Sister   ? Colon cancer Neg Hx   ? Colon polyps Neg Hx   ? Esophageal cancer Neg Hx   ? Stomach cancer Neg Hx   ? Rectal cancer Neg Hx   ?  ?Past Surgical  History:  ?Procedure Laterality Date  ? ABDOMINAL HYSTERECTOMY  1997  ? APPENDECTOMY    ? CESAREAN SECTION  [id number],[id number]  ? CHOLECYSTECTOMY    ? CHONDROPLASTY Left 11/23/2020  ? Procedure: CHONDROPLASTY MEDIAL FEMORAL PATELLA;  Surgeon: Dorna Leitz, MD;  Location: [facility];  Service: Orthopedics;  Laterality: Left;  ? GASTRIC BYPASS  2011  ? KNEE ARTHROSCOPY  2002  ? KNEE ARTHROSCOPY WITH EXCISION BAKER'S CYST Left 11/23/2020  ? Procedure: KNEE ARTHROSCOPY WITH ARTHROSCOPIC CYST EXCISION;  Surgeon: Dorna Leitz,  MD;  Location: [facility];  Service: Orthopedics;  Laterality: Left;  ? KNEE ARTHROSCOPY WITH LATERAL RELEASE Left 11/23/2020  ? Procedure: ARTHROSCOPY KNEE WITH LATERAL RETINACULAR RELEASE, PARTIAL LATERAL MENISCECTOMY;  Surgeon: Dorna Leitz, MD;  Location: [facility];  Service: Orthopedics;  Laterality: Left;  ? TONSILLECTOMY  1977  ? ULNAR NERVE TRANSPOSITION Right 06/12/2015  ? Procedure: RIGHT CUBITAL TUNNEL RELEASE;  Surgeon: Milly Jakob, MD;  Location: [facility];  Service: Orthopedics;  Laterality: Right;  ? ?Social History  ? ?Occupational History  ? Occupation: [employer]  ?Tobacco Use  ? Smoking status: Never  ? Smokeless tobacco: Never  ?Vaping Use  ? Vaping Use: Never used  ?Substance and Sexual Activity  ? Alcohol use: No  ? Drug use: No  ? Sexual activity: Not Currently  ?  Birth control/protection: Surgical  ?  Comment: Hysterectomy  ? ? ? ? ? ? ?

## 2021-09-25 ENCOUNTER — Ambulatory Visit (INDEPENDENT_AMBULATORY_CARE_PROVIDER_SITE_OTHER): Payer: No Typology Code available for payment source | Admitting: Physician Assistant

## 2021-09-25 ENCOUNTER — Encounter: Payer: Self-pay | Admitting: Physician Assistant

## 2021-09-25 DIAGNOSIS — L400 Psoriasis vulgaris: Secondary | ICD-10-CM

## 2021-09-25 MED ORDER — ENSTILAR 0.005-0.064 % EX FOAM: 1.0000 "application " | Freq: Every day | CUTANEOUS | 0 refills | Status: DC

## 2021-09-25 MED ORDER — OTEZLA 10 & 20 & 30 MG PO TBPK: ORAL_TABLET | ORAL | 0 refills | Status: DC

## 2021-09-25 NOTE — Patient Instructions (Signed)
[email or web address]  ?

## 2021-09-25 NOTE — Progress Notes (Signed)
? ?Established Patient Office Visit ? ?Subjective   ?Patient ID: Stacy Woods, female    DOB: 07-Nov-1965  Age: 56 y.o. MRN: [id number] ? ?Chief Complaint  ?Patient presents with  ? Hypertension  ? ? ?Hypertension ?This is a chronic problem. The current episode started more than 1 year ago. The problem has been gradually improving since onset. The problem is controlled. Associated symptoms include anxiety (health related). Pertinent negatives include no blurred vision, chest pain, headaches, malaise/fatigue, neck pain, palpitations, peripheral edema or shortness of breath. There are no associated agents to hypertension. Risk factors for coronary artery disease include dyslipidemia, obesity, stress and post-menopausal state. Past treatments include beta blockers. The current treatment provides significant improvement. There are no compliance problems.   ? ? ? ?Review of Systems  ?Constitutional:  Negative for chills, fever and malaise/fatigue.  ?Eyes:  Negative for blurred vision.  ?Respiratory:  Negative for cough and shortness of breath.   ?Cardiovascular:  Positive for leg swelling (left knee swelling). Negative for chest pain and palpitations.  ?Musculoskeletal:  Negative for neck pain.  ?Neurological:  Negative for headaches.  ?Psychiatric/Behavioral:  Negative for depression and suicidal ideas. The patient is nervous/anxious.   ? ?  ?Objective:  ?  ? ?BP 115/83 (BP Location: Right Arm, Cuff Size: Large)   Pulse 68   Resp 20   Ht '5\' 4"'$  (1.626 m)   Wt 211 lb 3.2 oz (95.8 kg)   SpO2 98%   BMI 36.25 kg/m?  ? ? ?Physical Exam ?Vitals reviewed.  ?Constitutional:   ?   General: She is not in acute distress. ?   Appearance: Normal appearance. She is obese. She is not ill-appearing.  ?HENT:  ?   Head: Normocephalic and atraumatic.  ?Cardiovascular:  ?   Rate and Rhythm: Normal rate and regular rhythm.  ?   Pulses: Normal pulses.  ?   Heart sounds: Normal heart sounds. No murmur heard. ?  No friction rub. No gallop.   ?Pulmonary:  ?   Effort: Pulmonary effort is normal. No respiratory distress.  ?   Breath sounds: Normal breath sounds. No wheezing.  ?Musculoskeletal:  ?   Comments: Left wrist brace  ?Skin: ?   General: Skin is warm and dry.  ?Neurological:  ?   Mental Status: She is alert and oriented to person, place, and time.  ?Psychiatric:     ?   Mood and Affect: Mood normal.     ?   Behavior: Behavior normal.     ?   Thought Content: Thought content normal.     ?   Judgment: Judgment normal.  ? ?No results found for any visits on 09/26/21. ? ? ? ?The 10-year ASCVD risk score (Arnett DK, et al., 2019) is: 2.4% ? ?  ?Assessment & Plan:  ? ?Problem List Items Addressed This Visit   ? ?  ? Cardiovascular and Mediastinum  ? Essential hypertension, benign - Primary (Chronic)  ?  Blood pressure well controlled today.  Continue Toprol-XL one half tab twice daily.  Alternatively, can take the full tablet first in the morning as this extended release should provide 24-hour coverage.  Continue monitoring blood pressure at home with goal of 130/80 or less.  Follow low-sodium diet and work on getting regular intentional exercise. ? ?  ?  ? Relevant Medications  ? metoprolol succinate (TOPROL-XL) 50 MG 24 hr tablet  ?  ? Other  ? Bilateral lower extremity edema  ?  This appears  to be improving although she does still have some periodic swelling on her knee.  Ortho has taken over managing the knee swelling and has done a couple of aspirations.  She will continue following with them and monitor her overall edema. ? ?  ?  ? ? ?Return in about 6 months (around 03/28/2022) for Hypertension/mood/insomnia follow-up.  ? ? ?___________________________________________ ?Clearnce Sorrel, DNP, APRN, FNP-BC ?[facility] ?[facility] ? ? ?

## 2021-09-26 ENCOUNTER — Telehealth: Payer: Self-pay | Admitting: *Deleted

## 2021-09-26 ENCOUNTER — Other Ambulatory Visit (HOSPITAL_BASED_OUTPATIENT_CLINIC_OR_DEPARTMENT_OTHER): Payer: Self-pay | Admitting: Family Medicine

## 2021-09-26 ENCOUNTER — Ambulatory Visit (INDEPENDENT_AMBULATORY_CARE_PROVIDER_SITE_OTHER): Payer: No Typology Code available for payment source | Admitting: Medical-Surgical

## 2021-09-26 ENCOUNTER — Other Ambulatory Visit (HOSPITAL_BASED_OUTPATIENT_CLINIC_OR_DEPARTMENT_OTHER): Payer: Self-pay

## 2021-09-26 ENCOUNTER — Other Ambulatory Visit: Payer: Self-pay | Admitting: Family Medicine

## 2021-09-26 ENCOUNTER — Encounter: Payer: Self-pay | Admitting: Physician Assistant

## 2021-09-26 ENCOUNTER — Encounter: Payer: Self-pay | Admitting: Medical-Surgical

## 2021-09-26 VITALS — BP 115/83 | HR 68 | Resp 20 | Ht 64.0 in | Wt 211.2 lb

## 2021-09-26 DIAGNOSIS — I10 Essential (primary) hypertension: Secondary | ICD-10-CM | POA: Diagnosis not present

## 2021-09-26 DIAGNOSIS — M25562 Pain in left knee: Secondary | ICD-10-CM

## 2021-09-26 DIAGNOSIS — R600 Localized edema: Secondary | ICD-10-CM | POA: Diagnosis not present

## 2021-09-26 MED ORDER — METOPROLOL SUCCINATE ER 50 MG PO TB24
25.0000 mg | ORAL_TABLET | Freq: Two times a day (BID) | ORAL | 1 refills | Status: DC
  Filled 2021-09-26: qty 90, 90d supply, fill #0
  Filled 2021-12-12: qty 30, 30d supply, fill #0
  Filled 2022-01-20: qty 30, 30d supply, fill #1
  Filled 2022-02-20: qty 30, 30d supply, fill #2
  Filled 2022-03-19: qty 30, 30d supply, fill #3

## 2021-09-26 NOTE — Progress Notes (Signed)
? ?  Follow-Up Visit ?  ?Subjective  ?Stacy Woods is a 56 y.o. female who presents for the following: Follow-up (Patient here today for non healing lesion on both lower legs patient had one lesion biopsied back in February and was taken off her BP meds. Per patient since then more lesions have popped up. Patient would like to know what's the next step for treatment. ). ? ? ?The following portions of the chart were reviewed this encounter and updated as appropriate:  Tobacco  Allergies  Meds  Problems  Med Hx  Surg Hx  Fam Hx   ?  ? ?Objective  ?Well appearing patient in no apparent distress; mood and affect are within normal limits. ? ?A focused examination was performed including legs. Relevant physical exam findings are noted in the Assessment and Plan. ? ?Left Lower Leg - Anterior, Right Lower Leg - Anterior ?Well-marginated erythematous, nummular papules/plaques with silvery scale.  ? ? ?Assessment & Plan  ?Plaque psoriasis ?Left Lower Leg - Anterior; Right Lower Leg - Anterior ? ?Will start patient on [place]. Sample given in office.  ? ?Apremilast (OTEZLA) 10 & 20 & 30 MG TBPK - Left Lower Leg - Anterior, Right Lower Leg - Anterior ?Take as directed per package instructions. ? ?Calcipotriene-Betameth Diprop (ENSTILAR) 0.005-0.064 % FOAM - Left Lower Leg - Anterior, Right Lower Leg - Anterior ?Apply 1 application. topically daily. ? ? ? ?I, Flossie Wexler, PA-C, have reviewed all documentation's for this visit.  The documentation on 09/26/21 for the exam, diagnosis, procedures and orders are all accurate and complete. ?

## 2021-09-26 NOTE — Assessment & Plan Note (Signed)
This appears to be improving although she does still have some periodic swelling on her knee.  Ortho has taken over managing the knee swelling and has done a couple of aspirations.  She will continue following with them and monitor her overall edema. ?

## 2021-09-26 NOTE — Assessment & Plan Note (Signed)
Blood pressure well controlled today.  Continue Toprol-XL one half tab twice daily.  Alternatively, can take the full tablet first in the morning as this extended release should provide 24-hour coverage.  Continue monitoring blood pressure at home with goal of 130/80 or less.  Follow low-sodium diet and work on getting regular intentional exercise. ?

## 2021-09-26 NOTE — Telephone Encounter (Signed)
Faxed over patients notes to otezla support.  ?

## 2021-09-30 ENCOUNTER — Other Ambulatory Visit (HOSPITAL_BASED_OUTPATIENT_CLINIC_OR_DEPARTMENT_OTHER): Payer: Self-pay

## 2021-09-30 ENCOUNTER — Telehealth: Payer: Self-pay

## 2021-09-30 NOTE — Telephone Encounter (Signed)
New enrollment and prescription for [facility] sent through the portal.  ?

## 2021-10-04 ENCOUNTER — Ambulatory Visit (HOSPITAL_BASED_OUTPATIENT_CLINIC_OR_DEPARTMENT_OTHER)
Admission: RE | Admit: 2021-10-04 | Discharge: 2021-10-04 | Disposition: A | Discharge status: Home / Self Care | Payer: No Typology Code available for payment source | Source: Ambulatory Visit | Attending: Family Medicine | Admitting: Family Medicine

## 2021-10-04 DIAGNOSIS — M25562 Pain in left knee: Secondary | ICD-10-CM | POA: Insufficient documentation

## 2021-10-04 DIAGNOSIS — W000XXA Fall on same level due to ice and snow, initial encounter: Secondary | ICD-10-CM | POA: Insufficient documentation

## 2021-10-04 DIAGNOSIS — M23242 Derangement of anterior horn of lateral meniscus due to old tear or injury, left knee: Secondary | ICD-10-CM | POA: Diagnosis not present

## 2021-10-04 DIAGNOSIS — M659 Synovitis and tenosynovitis, unspecified: Secondary | ICD-10-CM | POA: Diagnosis not present

## 2021-10-04 DIAGNOSIS — M1712 Unilateral primary osteoarthritis, left knee: Secondary | ICD-10-CM | POA: Insufficient documentation

## 2021-10-07 ENCOUNTER — Other Ambulatory Visit (HOSPITAL_COMMUNITY): Payer: Self-pay

## 2021-10-07 ENCOUNTER — Encounter: Payer: Self-pay | Admitting: Medical-Surgical

## 2021-10-07 HISTORY — PX: KNEE ARTHROSCOPY: SUR90

## 2021-10-08 ENCOUNTER — Telehealth: Payer: Self-pay

## 2021-10-08 ENCOUNTER — Other Ambulatory Visit (HOSPITAL_COMMUNITY): Payer: Self-pay

## 2021-10-08 MED ORDER — APREMILAST 30 MG PO TABS
ORAL_TABLET | ORAL | 11 refills | Status: DC
Start: 2021-09-30 — End: 2021-10-09

## 2021-10-08 NOTE — Telephone Encounter (Signed)
Fax received from [place] stating that the patient's benefit plan requires her to fill her Rutherford Nail at [facility]. Per Iantha Fallen they will transfer the patient's prescription.  ?

## 2021-10-08 NOTE — Telephone Encounter (Signed)
Form printed and completed. She will need to pick up the form and fill out her portion at the top before it can be faxed. Completed form given to [place].

## 2021-10-09 ENCOUNTER — Other Ambulatory Visit (HOSPITAL_COMMUNITY): Payer: Self-pay

## 2021-10-09 ENCOUNTER — Ambulatory Visit: Payer: No Typology Code available for payment source | Attending: Medical-Surgical | Admitting: Pharmacist

## 2021-10-09 ENCOUNTER — Encounter: Payer: Self-pay | Admitting: Family

## 2021-10-09 DIAGNOSIS — Z79899 Other long term (current) drug therapy: Secondary | ICD-10-CM

## 2021-10-09 MED ORDER — APREMILAST 30 MG PO TABS
ORAL_TABLET | ORAL | 11 refills | Status: DC
  Filled 2021-10-09: qty 60, 30d supply, fill #0
  Filled 2021-10-31: qty 60, 30d supply, fill #1

## 2021-10-09 NOTE — Progress Notes (Signed)
?  S: ?Patient presents for review of their specialty medication therapy. ? ?Patient is currently taking Otezla for psoriasis. Patient is managed by [facility] for this.  ? ?Adherence: has not yet started ? ?Efficacy: has not yet started ? ?Dosing:  ?Active psoriatic arthritis or plaque psoriasis (moderate to severe): Oral: Initial: 10 mg in the morning. Titrate upward by additional 10 mg per day on days 2 to 5 as follows: Day 2: 10 mg twice daily; Day 3: 10 mg in the morning and 20 mg in the evening; Day 4: 20 mg twice daily; Day 5: 20 mg in the morning and 30 mg in the evening. Maintenance dose: 30 mg twice daily starting on day 6 ? ?CrCl <30 mL/minute: Initial: 10 mg in the morning on days 1 to 3; titrate using morning doses only (skip evening doses) to 20 mg on days 4 and 5. Maintenance dose: 30 mg once daily in the morning starting on day 6.  ? ? ?Current adverse effects: ?Headache: none ?GI upset: none ?Weight loss: none ?Neuropsychiatric effects: none ? ?O: ?   ? ?Lab Results  ?Component Value Date  ? WBC 5.4 08/02/2021  ? HGB 14.4 08/02/2021  ? HCT 42.5 08/02/2021  ? MCV 89.7 08/02/2021  ? PLT 385 08/02/2021  ? ? ?  Chemistry   ?   ?Component Value Date/Time  ? NA 142 08/22/2021 1203  ? NA 138 05/09/2016 1332  ? K 4.2 08/22/2021 1203  ? K 3.6 05/09/2016 1332  ? CL 107 08/22/2021 1203  ? CO2 24 08/22/2021 1203  ? CO2 20 (L) 05/09/2016 1332  ? BUN 11 08/22/2021 1203  ? BUN 12.2 05/09/2016 1332  ? CREATININE 0.86 08/22/2021 1203  ? CREATININE 0.9 05/09/2016 1332  ?    ?Component Value Date/Time  ? CALCIUM 9.4 08/22/2021 1203  ? CALCIUM 9.1 05/09/2016 1332  ? ALKPHOS 105 07/24/2020 1051  ? ALKPHOS 121 05/09/2016 1332  ? AST 36 (H) 08/13/2021 0001  ? AST 26 07/24/2020 1051  ? AST 34 05/09/2016 1332  ? ALT 38 (H) 08/13/2021 0001  ? ALT 23 07/24/2020 1051  ? ALT 28 05/09/2016 1332  ? BILITOT 0.5 08/13/2021 0001  ? BILITOT 0.7 07/24/2020 1051  ? BILITOT 0.47 05/09/2016 1332  ?  ? ? ? ?A/P: ?1. Medication  review: patient is about to start [facility] for psoriasis. Reviewed the medication including the following: apremilast inhibits phosphodiesterase 4 (PDE4) specific for cyclic adenosine monophosphate (cAMP) which results in increased intracellular cAMP levels and regulation of numerous inflammatory mediators (eg, decreased expression of nitric oxide synthase, TNF-alpha, and interleukin [IL]-23, as well as increased IL-10. Patient educated on purpose, proper use and potential adverse effects of Otezla. Possible adverse effects include weight loss, GI upset, headache, and mood changes. Renal function should be routinely monitored. Administer without regard to food. Do not crush, chew, or split tablets. No recommendations for any changes at this time. ? ?Benard Halsted, PharmD, BCACP, CPP ?Clinical Pharmacist ?[place] ?[phone] ? ? ? ? ? ?

## 2021-10-10 ENCOUNTER — Other Ambulatory Visit (HOSPITAL_BASED_OUTPATIENT_CLINIC_OR_DEPARTMENT_OTHER): Payer: Self-pay

## 2021-10-10 MED ORDER — HEPLISAV-B 20 MCG/0.5ML IM SOSY
PREFILLED_SYRINGE | INTRAMUSCULAR | 0 refills | Status: DC
  Filled 2021-10-10: qty 0.5, 1d supply, fill #0

## 2021-10-14 ENCOUNTER — Other Ambulatory Visit (HOSPITAL_COMMUNITY): Payer: Self-pay

## 2021-10-16 ENCOUNTER — Other Ambulatory Visit (HOSPITAL_BASED_OUTPATIENT_CLINIC_OR_DEPARTMENT_OTHER): Payer: Self-pay

## 2021-10-16 MED ORDER — HYDROCODONE-ACETAMINOPHEN 7.5-325 MG PO TABS
1.0000 | ORAL_TABLET | Freq: Four times a day (QID) | ORAL | 0 refills | Status: DC | PRN
  Filled 2021-10-16: qty 25, 7d supply, fill #0

## 2021-10-17 ENCOUNTER — Other Ambulatory Visit (HOSPITAL_BASED_OUTPATIENT_CLINIC_OR_DEPARTMENT_OTHER): Payer: Self-pay

## 2021-10-30 ENCOUNTER — Other Ambulatory Visit: Payer: Self-pay | Admitting: Medical-Surgical

## 2021-10-30 ENCOUNTER — Other Ambulatory Visit (HOSPITAL_COMMUNITY): Payer: Self-pay

## 2021-10-31 ENCOUNTER — Other Ambulatory Visit (HOSPITAL_COMMUNITY): Payer: Self-pay

## 2021-10-31 ENCOUNTER — Other Ambulatory Visit (HOSPITAL_BASED_OUTPATIENT_CLINIC_OR_DEPARTMENT_OTHER): Payer: Self-pay

## 2021-10-31 MED ORDER — ZOLPIDEM TARTRATE 5 MG PO TABS
5.0000 mg | ORAL_TABLET | Freq: Every evening | ORAL | 5 refills | Status: DC | PRN
  Filled 2021-10-31: qty 30, 30d supply, fill #0
  Filled 2021-12-12: qty 30, 30d supply, fill #1
  Filled 2022-01-06: qty 30, 30d supply, fill #2
  Filled 2022-02-04: qty 30, 30d supply, fill #3
  Filled 2022-03-12: qty 30, 30d supply, fill #4
  Filled 2022-04-08: qty 30, 30d supply, fill #5

## 2021-10-31 NOTE — Telephone Encounter (Signed)
Last written 05/06/2021 #30 with 5 refills Last appt 09/26/2021

## 2021-11-02 ENCOUNTER — Other Ambulatory Visit: Payer: Self-pay | Admitting: Medical-Surgical

## 2021-11-02 DIAGNOSIS — F411 Generalized anxiety disorder: Secondary | ICD-10-CM

## 2021-11-04 ENCOUNTER — Other Ambulatory Visit: Payer: Self-pay | Admitting: Medical-Surgical

## 2021-11-05 ENCOUNTER — Other Ambulatory Visit (HOSPITAL_COMMUNITY): Payer: Self-pay

## 2021-11-05 ENCOUNTER — Other Ambulatory Visit (HOSPITAL_BASED_OUTPATIENT_CLINIC_OR_DEPARTMENT_OTHER): Payer: Self-pay

## 2021-11-05 ENCOUNTER — Encounter: Payer: Self-pay | Admitting: Family

## 2021-11-05 MED ORDER — ESCITALOPRAM OXALATE 10 MG PO TABS
ORAL_TABLET | Freq: Every day | ORAL | 0 refills | Status: DC
  Filled 2021-11-05: qty 90, 90d supply, fill #0

## 2021-11-05 MED ORDER — CYCLOBENZAPRINE HCL 10 MG PO TABS
ORAL_TABLET | Freq: Three times a day (TID) | ORAL | 1 refills | Status: DC | PRN
  Filled 2021-11-05: qty 90, 30d supply, fill #0
  Filled 2022-01-06: qty 90, 30d supply, fill #1

## 2021-11-19 ENCOUNTER — Other Ambulatory Visit (HOSPITAL_COMMUNITY): Payer: Self-pay

## 2021-11-25 ENCOUNTER — Other Ambulatory Visit (HOSPITAL_COMMUNITY): Payer: Self-pay

## 2021-12-11 ENCOUNTER — Encounter: Payer: Self-pay | Admitting: Family

## 2021-12-12 ENCOUNTER — Encounter: Payer: Self-pay | Admitting: Family

## 2021-12-12 ENCOUNTER — Other Ambulatory Visit (HOSPITAL_BASED_OUTPATIENT_CLINIC_OR_DEPARTMENT_OTHER): Payer: Self-pay

## 2021-12-13 ENCOUNTER — Other Ambulatory Visit (HOSPITAL_COMMUNITY): Payer: Self-pay

## 2021-12-18 ENCOUNTER — Ambulatory Visit: Payer: No Typology Code available for payment source | Admitting: Physician Assistant

## 2022-01-03 ENCOUNTER — Encounter: Payer: Self-pay | Admitting: Medical-Surgical

## 2022-01-05 NOTE — Progress Notes (Unsigned)
Virtual Visit via Video Note  I connected with Stacy Woods on 01/06/22 at  1:00 PM EDT by a video enabled telemedicine application and verified that I am speaking with the correct person using two identifiers.   I discussed the limitations of evaluation and management by telemedicine and the availability of in person appointments. The patient expressed understanding and agreed to proceed.  Patient location: home Provider locations: office  Subjective:    CC: weight loss  HPI: Pleasant 56 year old female presenting via [place] video visit to discuss weight loss options. She has been working to lose weight with [employer] and the Weight loss program in [place]. She lost 6lbs overall but could not sustain the restrictive nature of the program. She is interested in possibly doing phentermine but is also very interested in [place].  We had approach this previously with Ozempic however her insurance would not cover it.  Notes that her activity level is currently limited due to recent knee surgery that left her bone-on-bone in the left knee.  She is also starting to have issues with her right knee and may have to have surgery on that one as well.  Reports that her blood pressure has been running very well lately in the 1 teens over 82s.  Has also been cutting her blood pressure medication in half because it has been running so well.  Past medical history, Surgical history, Family history not pertinant except as noted below, Social history, Allergies, and medications have been entered into the medical record, reviewed, and corrections made.   Review of Systems: See HPI for pertinent positives and negatives.   Objective:    General: Speaking clearly in complete sentences without any shortness of breath.  Alert and oriented x3.  Normal judgment. No apparent acute distress.  Impression and Recommendations:    1. Encounter for weight management Since she has had very well controlled blood  pressure, okay to start phentermine 37.5 mg daily.  We will do approximately 3 months of this as a max.  In the meantime, sending in Wegovy 0.25 mg to see if we can get this pushed through insurance for coverage given her elevated BMI along with comorbidities including prediabetes and hypertension.  Reviewed recommendations for dietary intake including caloric goal of 1200-1400 cal/day, high-protein, moderate carbohydrates, and low fats.  Discussed regular intentional exercise and modifications that may be helpful given her knee issues.  I discussed the assessment and treatment plan with the patient. The patient was provided an opportunity to ask questions and all were answered. The patient agreed with the plan and demonstrated an understanding of the instructions.   The patient was advised to call back or seek an in-person evaluation if the symptoms worsen or if the condition fails to improve as anticipated.  25 minutes of non-face-to-face time was provided during this encounter.  Return in about 4 weeks (around 02/03/2022) for weight check.  Clearnce Sorrel, DNP, APRN, FNP-BC [facility]

## 2022-01-06 ENCOUNTER — Telehealth (INDEPENDENT_AMBULATORY_CARE_PROVIDER_SITE_OTHER): Payer: Managed Care, Other (non HMO) | Admitting: Medical-Surgical

## 2022-01-06 ENCOUNTER — Other Ambulatory Visit (HOSPITAL_BASED_OUTPATIENT_CLINIC_OR_DEPARTMENT_OTHER): Payer: Self-pay

## 2022-01-06 ENCOUNTER — Encounter: Payer: Self-pay | Admitting: Medical-Surgical

## 2022-01-06 DIAGNOSIS — Z7689 Persons encountering health services in other specified circumstances: Secondary | ICD-10-CM

## 2022-01-06 MED ORDER — PHENTERMINE HCL 37.5 MG PO CAPS
37.5000 mg | ORAL_CAPSULE | ORAL | 0 refills | Status: DC
  Filled 2022-01-06: qty 30, 30d supply, fill #0

## 2022-01-06 MED ORDER — WEGOVY 0.25 MG/0.5ML ~~LOC~~ SOAJ
0.2500 mg | SUBCUTANEOUS | 0 refills | Status: DC
Start: 2022-01-06 — End: 2022-02-21
  Filled 2022-01-06: qty 2, 28d supply, fill #0

## 2022-01-07 ENCOUNTER — Other Ambulatory Visit (HOSPITAL_BASED_OUTPATIENT_CLINIC_OR_DEPARTMENT_OTHER): Payer: Self-pay

## 2022-01-08 ENCOUNTER — Other Ambulatory Visit (HOSPITAL_BASED_OUTPATIENT_CLINIC_OR_DEPARTMENT_OTHER): Payer: Self-pay

## 2022-01-13 ENCOUNTER — Other Ambulatory Visit (HOSPITAL_COMMUNITY): Payer: Self-pay

## 2022-01-16 ENCOUNTER — Other Ambulatory Visit (HOSPITAL_BASED_OUTPATIENT_CLINIC_OR_DEPARTMENT_OTHER): Payer: Self-pay

## 2022-01-17 ENCOUNTER — Other Ambulatory Visit (HOSPITAL_BASED_OUTPATIENT_CLINIC_OR_DEPARTMENT_OTHER): Payer: Self-pay

## 2022-01-20 ENCOUNTER — Other Ambulatory Visit (HOSPITAL_BASED_OUTPATIENT_CLINIC_OR_DEPARTMENT_OTHER): Payer: Self-pay

## 2022-01-22 ENCOUNTER — Other Ambulatory Visit (HOSPITAL_BASED_OUTPATIENT_CLINIC_OR_DEPARTMENT_OTHER): Payer: Self-pay

## 2022-01-23 ENCOUNTER — Other Ambulatory Visit (HOSPITAL_BASED_OUTPATIENT_CLINIC_OR_DEPARTMENT_OTHER): Payer: Self-pay

## 2022-01-28 ENCOUNTER — Other Ambulatory Visit (HOSPITAL_BASED_OUTPATIENT_CLINIC_OR_DEPARTMENT_OTHER): Payer: Self-pay

## 2022-01-31 ENCOUNTER — Ambulatory Visit: Payer: No Typology Code available for payment source | Admitting: Family

## 2022-01-31 ENCOUNTER — Other Ambulatory Visit (HOSPITAL_BASED_OUTPATIENT_CLINIC_OR_DEPARTMENT_OTHER): Payer: Self-pay

## 2022-01-31 ENCOUNTER — Other Ambulatory Visit: Payer: No Typology Code available for payment source

## 2022-02-04 ENCOUNTER — Other Ambulatory Visit: Payer: Self-pay | Admitting: Medical-Surgical

## 2022-02-04 ENCOUNTER — Other Ambulatory Visit (HOSPITAL_BASED_OUTPATIENT_CLINIC_OR_DEPARTMENT_OTHER): Payer: Self-pay

## 2022-02-04 DIAGNOSIS — F411 Generalized anxiety disorder: Secondary | ICD-10-CM

## 2022-02-05 ENCOUNTER — Other Ambulatory Visit (HOSPITAL_BASED_OUTPATIENT_CLINIC_OR_DEPARTMENT_OTHER): Payer: Self-pay

## 2022-02-06 ENCOUNTER — Other Ambulatory Visit (HOSPITAL_BASED_OUTPATIENT_CLINIC_OR_DEPARTMENT_OTHER): Payer: Self-pay

## 2022-02-06 ENCOUNTER — Encounter: Payer: Self-pay | Admitting: Family

## 2022-02-07 ENCOUNTER — Other Ambulatory Visit (HOSPITAL_BASED_OUTPATIENT_CLINIC_OR_DEPARTMENT_OTHER): Payer: Self-pay

## 2022-02-07 MED ORDER — ESCITALOPRAM OXALATE 10 MG PO TABS
10.0000 mg | ORAL_TABLET | Freq: Every day | ORAL | 3 refills | Status: DC
  Filled 2022-02-07: qty 30, 30d supply, fill #0
  Filled 2022-03-12: qty 30, 30d supply, fill #1
  Filled 2022-04-16: qty 30, 30d supply, fill #2
  Filled 2022-05-15: qty 30, 30d supply, fill #3

## 2022-02-07 MED ORDER — PHENTERMINE HCL 37.5 MG PO CAPS
37.5000 mg | ORAL_CAPSULE | ORAL | 0 refills | Status: DC
  Filled 2022-02-07: qty 30, 30d supply, fill #0

## 2022-02-07 NOTE — Telephone Encounter (Signed)
Patient has upcoming appointment on 02/21/2022 with me so refills sent in to get her through.

## 2022-02-07 NOTE — Telephone Encounter (Signed)
Last video visit 01/06/2022  Last filled 01/06/2022

## 2022-02-11 ENCOUNTER — Other Ambulatory Visit (HOSPITAL_BASED_OUTPATIENT_CLINIC_OR_DEPARTMENT_OTHER): Payer: Self-pay

## 2022-02-11 ENCOUNTER — Encounter: Payer: Self-pay | Admitting: Family

## 2022-02-13 ENCOUNTER — Other Ambulatory Visit (HOSPITAL_BASED_OUTPATIENT_CLINIC_OR_DEPARTMENT_OTHER): Payer: Self-pay

## 2022-02-14 ENCOUNTER — Other Ambulatory Visit (HOSPITAL_BASED_OUTPATIENT_CLINIC_OR_DEPARTMENT_OTHER): Payer: Self-pay

## 2022-02-17 ENCOUNTER — Other Ambulatory Visit (HOSPITAL_BASED_OUTPATIENT_CLINIC_OR_DEPARTMENT_OTHER): Payer: Self-pay

## 2022-02-18 ENCOUNTER — Other Ambulatory Visit (HOSPITAL_BASED_OUTPATIENT_CLINIC_OR_DEPARTMENT_OTHER): Payer: Self-pay

## 2022-02-20 ENCOUNTER — Other Ambulatory Visit (HOSPITAL_BASED_OUTPATIENT_CLINIC_OR_DEPARTMENT_OTHER): Payer: Self-pay

## 2022-02-21 ENCOUNTER — Encounter: Payer: Self-pay | Admitting: Medical-Surgical

## 2022-02-21 ENCOUNTER — Ambulatory Visit (INDEPENDENT_AMBULATORY_CARE_PROVIDER_SITE_OTHER): Payer: Managed Care, Other (non HMO) | Admitting: Medical-Surgical

## 2022-02-21 VITALS — BP 130/89 | HR 77 | Resp 20 | Ht 64.0 in | Wt 206.2 lb

## 2022-02-21 DIAGNOSIS — H66001 Acute suppurative otitis media without spontaneous rupture of ear drum, right ear: Secondary | ICD-10-CM | POA: Diagnosis not present

## 2022-02-21 DIAGNOSIS — I10 Essential (primary) hypertension: Secondary | ICD-10-CM | POA: Diagnosis not present

## 2022-02-21 DIAGNOSIS — L404 Guttate psoriasis: Secondary | ICD-10-CM

## 2022-02-21 DIAGNOSIS — Z7689 Persons encountering health services in other specified circumstances: Secondary | ICD-10-CM | POA: Diagnosis not present

## 2022-02-21 MED ORDER — AZITHROMYCIN 250 MG PO TABS: ORAL_TABLET | ORAL | 0 refills | Status: AC

## 2022-02-21 MED ORDER — OTEZLA 30 MG PO TABS: 30.0000 mg | ORAL_TABLET | Freq: Two times a day (BID) | ORAL | 3 refills | Status: DC

## 2022-02-21 NOTE — Progress Notes (Signed)
Medical screening examination/treatment was performed by qualified nurse practitioner student and as supervising provider I was immediately available for consultation/collaboration. I have reviewed documentation and agree with assessment and plan. ° °Romon Devereux L. Oakley Kossman, DNP, APRN, FNP-BC °[facility] °Primary Care and Sports Medicine ° °

## 2022-02-21 NOTE — Progress Notes (Addendum)
Established Patient Office Visit  Subjective   Patient ID: Stacy Woods, female    DOB: June 25, 1965  Age: 56 y.o. MRN: [id number]  Chief Complaint  Patient presents with   Follow-up   Cerumen Impaction    Stacy Woods is a very pleasant 56 year old female here today for medication follow up on Phentermine 37.5 mg for 6 weeks now. She reports that theres no significant side effects and she's tolerating it well. There is a decreased in appetite but noticeable weight change. At this visit, she also complaint of her right ear being bothersome for about a week now, no drainage, pain, fever and or chills. She is also asking for a prescription refill on Otezla for her plaque psoriasis.       Review of Systems  Constitutional: Negative.   HENT:  Positive for hearing loss (described as "muffled, clicking, clogged" on right side.).   Respiratory: Negative.    Skin:  Positive for rash (right lower leg).  Neurological: Negative.   Psychiatric/Behavioral: Negative.        Objective:     BP 130/89 (BP Location: Left Arm, Cuff Size: Normal)   Pulse 77   Resp 20   Ht '5\' 4"'$  (1.626 m)   Wt 93.5 kg   SpO2 99%   BMI 35.39 kg/m    Physical Exam Constitutional:      Appearance: Normal appearance. She is normal weight.  HENT:     Head: Normocephalic and atraumatic.     Right Ear: Ear canal and external ear normal. There is no impacted cerumen. Tympanic membrane is erythematous.     Left Ear: Ear canal and external ear normal. There is no impacted cerumen. Tympanic membrane is not erythematous.     Ears:     Comments: Right TM erythematous, cloudy effusion, dull.    Nose: Nose normal.     Mouth/Throat:     Mouth: Mucous membranes are moist.  Eyes:     Pupils: Pupils are equal, round, and reactive to light.  Pulmonary:     Effort: Pulmonary effort is normal.  Skin:    General: Skin is warm and dry.     Findings: Rash (right lower leg) present.  Neurological:     General: No focal  deficit present.     Mental Status: She is alert and oriented to person, place, and time. Mental status is at baseline.  Psychiatric:        Mood and Affect: Mood normal.        Behavior: Behavior normal.        Thought Content: Thought content normal.        Judgment: Judgment normal.    No results found for any visits on 02/21/22.  The 10-year ASCVD risk score (Arnett DK, et al., 2019) is: 3.1%    Assessment & Plan:   1. Non-recurrent acute suppurative otitis media of right ear without spontaneous rupture of tympanic membrane Azithromycin 500 mg today followed by 250 mg daily x4 days.  Consider adding Flonase 2 sprays each nostril once daily.  2. Essential hypertension, benign Blood pressure at borderline today but otherwise well controlled.  Continue Toprol-XL 25 mg twice daily.  Monitor blood pressure at home with goal of 130/80 or less.  Low-sodium diet, regular intentional exercise, and weight loss to a healthy weight recommended.  3. Encounter for weight management Educated on importance of increasing activity level.  For now continue phentermine that is on hand at home.  Reaching out to staff to update the [facility] prior authorization information.   4. Guttate psoriasis Able to manage Otezla 30 mg twice daily in our clinic.  Medication sent to her pharmacy as requested.  If needed in the future, we can absolutely get her in with a new dermatologist.  Return in about 6 weeks (around 04/04/2022) for Medication follow up  (already scheduled).    Ralph Leyden, RN

## 2022-02-23 ENCOUNTER — Telehealth: Payer: Self-pay | Admitting: Neurology

## 2022-02-23 NOTE — Telephone Encounter (Signed)
Prior Authorization for [facility] submitted via covermymeds. Awaiting response.

## 2022-03-03 ENCOUNTER — Encounter: Payer: Self-pay | Admitting: Medical-Surgical

## 2022-03-03 ENCOUNTER — Other Ambulatory Visit (HOSPITAL_BASED_OUTPATIENT_CLINIC_OR_DEPARTMENT_OTHER): Payer: Self-pay

## 2022-03-12 ENCOUNTER — Other Ambulatory Visit: Payer: Self-pay | Admitting: Medical-Surgical

## 2022-03-12 ENCOUNTER — Other Ambulatory Visit (HOSPITAL_BASED_OUTPATIENT_CLINIC_OR_DEPARTMENT_OTHER): Payer: Self-pay

## 2022-03-12 NOTE — Telephone Encounter (Signed)
Initiated Prior authorization [id number] 0.25MG/0.5ML auto-injectors Via: Covermymeds Case/Key: Status: Denied  as of 03/12/22 Reason:There is an indication that your patient will use Wegovy in combination with other weight loss  medications. Semaglutide [facility]) is considered medically necessary for weight loss in adults when one of the  following is met (A or B): A. For initial approval: When the individual meets all of the following  criteria: 61. 35 years of age or older; 2. Has engaged in a trial of behavioral modification and dietary  restriction for at least 3 months; 3. One of the following (a or b): a. Currently has a body mass  index (BMI) of at least 30 kg/m2; or b. Currently has a BMI of at least 27 kg/m2 and at least one of  the following weight-related comorbidities: cardiovascular disease (CVD), dyslipidemia,  hypertension, obstructive sleep apnea, type 2 diabetes, or impaired glucose tolerance; and 4.  Mancel Parsons will be used concomitantly with behavioral modification and a reduced-calorie diet; or B.  For continued use: When the individual meets all of the following: 2. 52 years of age or older; 2.  One of the following (a or b): a. At baseline (prior to the initiation of Wegovy), had a BMI of at least  30 kg/m2; or b. At baseline (prior to the initiation of Wegovy), had a BMI of at least 27 kg/m2 and at  least one of the following weight-related comorbidities: cardiovascular disease (CVD), dyslipidemia,  hypertension, obstructive sleep apnea, type 2 diabetes, or impaired glucose tolerance; 3. Has lost  at least 5% of baseline (prior to the initiation of Wegovy) body weight (only required once); and 4.  Mancel Parsons will be used concomitantly with behavioral modification and a reduced-calorie diet. Any  other use is considered experimental, investigational or unproven, including the following (this list  may not be all inclusive): 1. Concomitant use with other weight loss medications;  and/or 2.  Concomitant use with other glucagon-like peptide-1 agonists. Christella Scheuermann Coverage Policy [id number] Weight Loss - Semaglutide [facility]) was used to complete this  review. I have reviewed information from your patient's benefit plan and any policies and guidelines  needed to reach this decision. The information submitted did not meet the criteria necessary to  approve this medication. Notified Pt via: Mychart,please note submission was from a another user

## 2022-03-13 ENCOUNTER — Telehealth: Payer: Self-pay

## 2022-03-13 ENCOUNTER — Other Ambulatory Visit (HOSPITAL_BASED_OUTPATIENT_CLINIC_OR_DEPARTMENT_OTHER): Payer: Self-pay

## 2022-03-13 MED ORDER — PHENTERMINE HCL 37.5 MG PO CAPS
37.5000 mg | ORAL_CAPSULE | Freq: Every morning | ORAL | 0 refills | Status: DC
  Filled 2022-03-13: qty 30, 30d supply, fill #0

## 2022-03-13 NOTE — Telephone Encounter (Addendum)
resubmission Prior authorization [id number] 0.25MG/0.5ML auto-injectors Via: Covermymeds Case/Key:[id number] Status: denied  as of 03/13/22 Reason:There is an indication that your patient will use Wegovy in combination with other weight loss  medications. Semaglutide [facility]) is considered medically necessary for weight loss in adults when one of the  following is met (A or B): A. For initial approval: When the individual meets all of the following  criteria: 65. 62 years of age or older; 2. Has engaged in a trial of behavioral modification and dietary  restriction for at least 3 months; 3. One of the following (a or b): a. Currently has a body mass  index (BMI) of at least 30 kg/m2; or b. Currently has a BMI of at least 27 kg/m2 and at least one of  the following weight-related comorbidities: cardiovascular disease (CVD), dyslipidemia,  hypertension, obstructive sleep apnea, type 2 diabetes, or impaired glucose tolerance; and 4.  Mancel Parsons will be used concomitantly with behavioral modification and a reduced-calorie diet; or B.  For continued use: When the individual meets all of the following: 53. 8 years of age or older; 2.  One of the following (a or b): a. At baseline (prior to the initiation of Wegovy), had a BMI of at least  30 kg/m2; or b. At baseline (prior to the initiation of Wegovy), had a BMI of at least 27 kg/m2 and at  least one of the following weight-related comorbidities: cardiovascular disease (CVD), dyslipidemia,  hypertension, obstructive sleep apnea, type 2 diabetes, or impaired glucose tolerance; 3. Has lost  at least 5% of baseline (prior to the initiation of Wegovy) body weight (only required once); and 4.  Mancel Parsons will be used concomitantly with behavioral modification and a reduced-calorie diet. Any  other use is considered experimental, investigational or unproven, including the following (this list  may not be all inclusive): 1. Concomitant use with other weight loss  medications; and/or 2.  Concomitant use with other glucagon-like peptide-1 agonists. Christella Scheuermann Coverage Policy [id number] Weight Loss - Semaglutide [facility]) was used to complete this  review. I have reviewed information from your patient's benefit plan and any policies and guidelines  needed to reach this decision. The information submitted did not meet the criteria necessary to  approve this medication. Notified Pt via: Mychart

## 2022-03-13 NOTE — Telephone Encounter (Signed)
Phentermine refilled but this will be her third month and recommend stopping the medication due to lack of safety for long-term treatment.  Stacy Woods was denied however I have asked her to be resubmitted with additional information.  If a peer to peer or formal appeal is necessary, please route that to me and we will get it taken care of.

## 2022-03-19 ENCOUNTER — Other Ambulatory Visit: Payer: Self-pay | Admitting: Medical-Surgical

## 2022-03-20 ENCOUNTER — Other Ambulatory Visit (HOSPITAL_BASED_OUTPATIENT_CLINIC_OR_DEPARTMENT_OTHER): Payer: Self-pay

## 2022-03-21 ENCOUNTER — Other Ambulatory Visit: Payer: Self-pay | Admitting: Medical-Surgical

## 2022-03-21 ENCOUNTER — Other Ambulatory Visit (HOSPITAL_BASED_OUTPATIENT_CLINIC_OR_DEPARTMENT_OTHER): Payer: Self-pay

## 2022-03-24 ENCOUNTER — Other Ambulatory Visit (HOSPITAL_BASED_OUTPATIENT_CLINIC_OR_DEPARTMENT_OTHER): Payer: Self-pay

## 2022-03-28 ENCOUNTER — Ambulatory Visit: Payer: No Typology Code available for payment source | Admitting: Medical-Surgical

## 2022-03-28 ENCOUNTER — Other Ambulatory Visit: Payer: Self-pay

## 2022-03-28 NOTE — Telephone Encounter (Signed)
Patient called and lvm - requesting rx for Rutherford Nail to specialty pharmacy - fax # [phone] She states she had a bad psoriasis rash on face and legs- states can reach her at phone # [phone] or by Mychart message  Pt last appt 02/21/2022 Next appt 04/01/2022

## 2022-03-28 NOTE — Telephone Encounter (Signed)
Patient called and lvm - requesting rx for Rutherford Nail to specialty pharmacy - fax # [phone] She states she had a bad psoriasis rash on face and legs- states can reach her at phone # [phone] or by Mychart message  Pt last appt 02/21/2022 Next appt 04/01/2022  Sent to [place] [employer] -covering for [employer]

## 2022-03-31 MED ORDER — OTEZLA 30 MG PO TABS: 30.0000 mg | ORAL_TABLET | Freq: Two times a day (BID) | ORAL | 3 refills | Status: DC

## 2022-03-31 NOTE — Progress Notes (Signed)
Established Patient Office Visit  Subjective   Patient ID: Stacy Woods, female   DOB: 1965-08-22 Age: 56 y.o. MRN: [id number]   Chief Complaint  Patient presents with   Hypertension   Insomnia   HPI Pleasant 56 year old female presenting today for the following:  HTN: Taking metoprolol 50 mg daily, tolerating well without side effects.  Monitors her blood pressure at home and notes that her readings are usually at or below goal of 130/80.  Following a low-sodium diet.  Exercise limited by orthopedic conditions. Denies CP, SOB, palpitations, lower extremity edema, dizziness, headaches, or vision changes.  B12 deficiency: Not currently taking vitamin B12.  Due for recheck.  Fibromyalgia: Reports continued issues with generalized musculoskeletal pain.  Take Flexeril 10 mg 3 times daily as needed, mostly takes at bedtime to help with sleep.  Reports this is beneficial.  IDA: Followed by hematology.  Last iron levels checked in March.  Mood: Currently taking Lexapro 10 mg daily, tolerating well without side effects.  Reports the medication works well for her and helps to keep her mood stable.  Denies SI/HI.  Insomnia: Taking Ambien 5 mg nightly, tolerating well without side effects.  Reports this is working well to help her get quality sleep at night.  IBS: Notes that her IBS symptoms have been stable lately.  No need for further intervention at this time.  Weight concerns: Has been taking phentermine 37.5 mg daily, tolerating well without side effects.  Notes that this does help with appetite suppression however she has not had any luck losing weight.  She reports that her weight will fluctuate up and down a few pounds but has had no lasting effects.  The prescription for Mancel Parsons was denied by insurance after the appeal as they said she was on it weight loss agent already although this was intended to be temporary.  Her exercise is limited as noted above.  Reports that she only eats  approximately once daily.   Objective:    Vitals:   04/01/22 0914  BP: 129/85  Pulse: 61  Resp: 20  Height: '5\' 4"'$  (1.626 m)  Weight: 208 lb 8 oz (94.6 kg)  SpO2: 100%  BMI (Calculated): 35.77    Physical Exam Vitals and nursing note reviewed.  Constitutional:      General: She is not in acute distress.    Appearance: Normal appearance. She is obese. She is not ill-appearing.  HENT:     Head: Normocephalic and atraumatic.  Cardiovascular:     Rate and Rhythm: Normal rate and regular rhythm.     Pulses: Normal pulses.     Heart sounds: Normal heart sounds.  Pulmonary:     Effort: Pulmonary effort is normal. No respiratory distress.     Breath sounds: Normal breath sounds. No wheezing, rhonchi or rales.  Skin:    General: Skin is warm and dry.     Findings: No lesion.  Neurological:     Mental Status: She is alert and oriented to person, place, and time.  Psychiatric:        Mood and Affect: Mood normal.        Behavior: Behavior normal.        Thought Content: Thought content normal.        Judgment: Judgment normal.   No results found for this or any previous visit (from the past 24 hour(s)).     The 10-year ASCVD risk score (Arnett DK, et al., 2019) is: 3.3%  Values used to calculate the score:     Age: 38 years     Sex: Female     Is Non-Hispanic African American: No     Diabetic: No     Tobacco smoker: No     Systolic Blood Pressure: 378 mmHg     Is BP treated: Yes     HDL Cholesterol: 39 mg/dL     Total Cholesterol: 189 mg/dL   Assessment & Plan:   1. Essential hypertension, benign Checking labs as below.  Blood pressure at goal.  Continue monitoring at home with goal of 130/80 or less.  Continue low-sodium diet.  Aim for activity as tolerated with a goal for weight loss. - CBC with Differential/Platelet - COMPLETE METABOLIC PANEL WITH GFR - Lipid panel  2. B12 deficiency Checking vitamin B12 today.  3. Fibromyalgia Continue Flexeril 10 mg 3  times daily as needed.  4. Iron deficiency anemia, unspecified iron deficiency anemia type Managed by hematology.  5. Anxiety state Symptoms stable.  Continue Lexapro 10 mg daily.  6. Hypoglycemia Checking hemoglobin A1c. - Hemoglobin A1c  7. Irritable bowel syndrome, unspecified type Symptoms stable.  Continue diet and lifestyle modifications for management.  8. Abnormal thyroid function test Rechecking thyroid function. - TSH - T4, free  9. Prediabetes Checking A1c. - Hemoglobin A1c  10. Need for immunization against influenza Flu vaccine given in office today. - Flu Vaccine QUAD 14moIM (Fluarix, Fluzone & Alfiuria Quad PF)  11.  Encounter for weight management Reducing phentermine to 15 mg daily.  Adding Topamax 25 mg daily for 1 week then increase to 50 mg daily.  Recommendations for nutritional intake as well as regular intentional activity to aid with weight loss.  Strongly recommend eating more than once a day and aiming to get adequate protein intake with meals/snacks.  Return if symptoms worsen or fail to improve.  Further follow-up pending lab results.  ___________________________________________ JClearnce Sorrel DNP, APRN, FNP-BC [facility] [place]

## 2022-03-31 NOTE — Telephone Encounter (Signed)
Patient  states script must be printed and faxed to the specialty pharmacy  at [phone]. States local pharmacy that was it was sent to can not fill this for the patient.

## 2022-04-01 ENCOUNTER — Encounter: Payer: Self-pay | Admitting: Medical-Surgical

## 2022-04-01 ENCOUNTER — Other Ambulatory Visit (HOSPITAL_BASED_OUTPATIENT_CLINIC_OR_DEPARTMENT_OTHER): Payer: Self-pay

## 2022-04-01 ENCOUNTER — Ambulatory Visit (INDEPENDENT_AMBULATORY_CARE_PROVIDER_SITE_OTHER): Payer: Managed Care, Other (non HMO) | Admitting: Medical-Surgical

## 2022-04-01 VITALS — BP 129/85 | HR 61 | Resp 20 | Ht 64.0 in | Wt 208.5 lb

## 2022-04-01 DIAGNOSIS — R790 Abnormal level of blood mineral: Secondary | ICD-10-CM

## 2022-04-01 DIAGNOSIS — Z23 Encounter for immunization: Secondary | ICD-10-CM

## 2022-04-01 DIAGNOSIS — M797 Fibromyalgia: Secondary | ICD-10-CM

## 2022-04-01 DIAGNOSIS — R7303 Prediabetes: Secondary | ICD-10-CM

## 2022-04-01 DIAGNOSIS — E162 Hypoglycemia, unspecified: Secondary | ICD-10-CM

## 2022-04-01 DIAGNOSIS — D509 Iron deficiency anemia, unspecified: Secondary | ICD-10-CM

## 2022-04-01 DIAGNOSIS — I10 Essential (primary) hypertension: Secondary | ICD-10-CM | POA: Diagnosis not present

## 2022-04-01 DIAGNOSIS — R946 Abnormal results of thyroid function studies: Secondary | ICD-10-CM

## 2022-04-01 DIAGNOSIS — E538 Deficiency of other specified B group vitamins: Secondary | ICD-10-CM

## 2022-04-01 DIAGNOSIS — F411 Generalized anxiety disorder: Secondary | ICD-10-CM

## 2022-04-01 DIAGNOSIS — Z7689 Persons encountering health services in other specified circumstances: Secondary | ICD-10-CM

## 2022-04-01 DIAGNOSIS — K589 Irritable bowel syndrome without diarrhea: Secondary | ICD-10-CM

## 2022-04-01 MED ORDER — CYCLOBENZAPRINE HCL 10 MG PO TABS
10.0000 mg | ORAL_TABLET | Freq: Three times a day (TID) | ORAL | 1 refills | Status: DC | PRN
  Filled 2022-04-01: qty 90, 30d supply, fill #0
  Filled 2022-06-10: qty 90, 30d supply, fill #1

## 2022-04-01 MED ORDER — METOPROLOL SUCCINATE ER 50 MG PO TB24
50.0000 mg | ORAL_TABLET | Freq: Every day | ORAL | 1 refills | Status: DC
  Filled 2022-04-01: qty 90, 90d supply, fill #0
  Filled 2022-04-25: qty 30, 30d supply, fill #0

## 2022-04-01 MED ORDER — OTEZLA 30 MG PO TABS: 30.0000 mg | ORAL_TABLET | Freq: Two times a day (BID) | ORAL | 3 refills | Status: DC

## 2022-04-01 MED ORDER — PHENTERMINE HCL 15 MG PO CAPS
15.0000 mg | ORAL_CAPSULE | ORAL | 0 refills | Status: DC
  Filled 2022-04-01: qty 30, 30d supply, fill #0

## 2022-04-01 MED ORDER — TOPIRAMATE 25 MG PO TABS
25.0000 mg | ORAL_TABLET | Freq: Two times a day (BID) | ORAL | 1 refills | Status: DC
  Filled 2022-04-01: qty 60, 30d supply, fill #0

## 2022-04-01 NOTE — Telephone Encounter (Signed)
Prescription printed. Please fax to the specialty pharmacy at the number provided.   ___________________________________________ Clearnce Sorrel, DNP, APRN, FNP-BC [facility] [place]

## 2022-04-01 NOTE — Addendum Note (Signed)
Addended bySamuel Bouche on: 04/01/2022 07:14 AM   Modules accepted: Orders

## 2022-04-02 LAB — CBC WITH DIFFERENTIAL/PLATELET
Absolute Monocytes: 342 cells/uL (ref 200–950)
Basophils Absolute: 70 cells/uL (ref 0–200)
Basophils Relative: 1.2 %
Eosinophils Absolute: 151 cells/uL (ref 15–500)
Eosinophils Relative: 2.6 %
HCT: 43.2 % (ref 35.0–45.0)
Hemoglobin: 14.1 g/dL (ref 11.7–15.5)
Lymphs Abs: 2227 cells/uL (ref 850–3900)
MCH: 30.2 pg (ref 27.0–33.0)
MCHC: 32.6 g/dL (ref 32.0–36.0)
MCV: 92.5 fL (ref 80.0–100.0)
MPV: 11 fL (ref 7.5–12.5)
Monocytes Relative: 5.9 %
Neutro Abs: 3010 cells/uL (ref 1500–7800)
Neutrophils Relative %: 51.9 %
Platelets: 338 10*3/uL (ref 140–400)
RBC: 4.67 10*6/uL (ref 3.80–5.10)
RDW: 13.8 % (ref 11.0–15.0)
Total Lymphocyte: 38.4 %
WBC: 5.8 10*3/uL (ref 3.8–10.8)

## 2022-04-02 LAB — LIPID PANEL
Cholesterol: 225 mg/dL — ABNORMAL HIGH (ref <200)
HDL: 40 mg/dL — ABNORMAL LOW (ref >=50)
LDL Cholesterol (Calc): 159 mg/dL (calc) — ABNORMAL HIGH
Non-HDL Cholesterol (Calc): 185 mg/dL (calc) — ABNORMAL HIGH (ref <130)
Total CHOL/HDL Ratio: 5.6 (calc) — ABNORMAL HIGH (ref <5.0)
Triglycerides: 131 mg/dL (ref <150)

## 2022-04-02 LAB — COMPLETE METABOLIC PANEL WITH GFR
AG Ratio: 1.8 (calc) (ref 1.0–2.5)
ALT: 30 U/L — ABNORMAL HIGH (ref 6–29)
AST: 27 U/L (ref 10–35)
Albumin: 4.2 g/dL (ref 3.6–5.1)
Alkaline phosphatase (APISO): 120 U/L (ref 37–153)
BUN: 10 mg/dL (ref 7–25)
CO2: 27 mmol/L (ref 20–32)
Calcium: 9.1 mg/dL (ref 8.6–10.4)
Chloride: 108 mmol/L (ref 98–110)
Creat: 0.93 mg/dL (ref 0.50–1.03)
Globulin: 2.4 g/dL (calc) (ref 1.9–3.7)
Glucose, Bld: 87 mg/dL (ref 65–99)
Potassium: 4 mmol/L (ref 3.5–5.3)
Sodium: 142 mmol/L (ref 135–146)
Total Bilirubin: 0.6 mg/dL (ref 0.2–1.2)
Total Protein: 6.6 g/dL (ref 6.1–8.1)
eGFR: 73 mL/min/{1.73_m2} (ref >=60)

## 2022-04-02 LAB — T4, FREE: Free T4: 1 ng/dL (ref 0.8–1.8)

## 2022-04-02 LAB — TSH: TSH: 2.43 mIU/L

## 2022-04-02 LAB — HEMOGLOBIN A1C
Hgb A1c MFr Bld: 5.5 % of total Hgb (ref <5.7)
Mean Plasma Glucose: 111 mg/dL
eAG (mmol/L): 6.2 mmol/L

## 2022-04-08 ENCOUNTER — Other Ambulatory Visit (HOSPITAL_BASED_OUTPATIENT_CLINIC_OR_DEPARTMENT_OTHER): Payer: Self-pay

## 2022-04-09 ENCOUNTER — Other Ambulatory Visit: Payer: Self-pay

## 2022-04-09 NOTE — Progress Notes (Unsigned)
The speciality pharmacy called requesting that this Rx to be sent to [facility] electronically since the fax that was received on 04/01/2022 doesn't have the dual signature on it they can not process the refill.

## 2022-04-10 MED ORDER — OTEZLA 30 MG PO TABS: 30.0000 mg | ORAL_TABLET | Freq: Two times a day (BID) | ORAL | 3 refills | Status: DC

## 2022-04-10 NOTE — Progress Notes (Signed)
Prescription sent again.   ___________________________________________ Clearnce Sorrel, DNP, APRN, FNP-BC [facility] [place]

## 2022-04-16 ENCOUNTER — Other Ambulatory Visit (HOSPITAL_BASED_OUTPATIENT_CLINIC_OR_DEPARTMENT_OTHER): Payer: Self-pay

## 2022-04-24 ENCOUNTER — Other Ambulatory Visit (HOSPITAL_COMMUNITY): Payer: Self-pay

## 2022-04-25 ENCOUNTER — Other Ambulatory Visit (HOSPITAL_BASED_OUTPATIENT_CLINIC_OR_DEPARTMENT_OTHER): Payer: Self-pay

## 2022-05-08 ENCOUNTER — Other Ambulatory Visit (HOSPITAL_BASED_OUTPATIENT_CLINIC_OR_DEPARTMENT_OTHER): Payer: Self-pay

## 2022-05-08 ENCOUNTER — Other Ambulatory Visit: Payer: Self-pay | Admitting: Medical-Surgical

## 2022-05-08 MED ORDER — ZOLPIDEM TARTRATE 5 MG PO TABS
5.0000 mg | ORAL_TABLET | Freq: Every evening | ORAL | 1 refills | Status: DC | PRN
  Filled 2022-05-08: qty 30, 30d supply, fill #0
  Filled 2022-06-10: qty 30, 30d supply, fill #1

## 2022-05-15 ENCOUNTER — Telehealth: Payer: Self-pay | Admitting: Medical-Surgical

## 2022-05-16 MED ORDER — PHENTERMINE HCL 15 MG PO CAPS
15.0000 mg | ORAL_CAPSULE | ORAL | 0 refills | Status: DC
  Filled 2022-05-16: qty 30, 30d supply, fill #0

## 2022-05-16 NOTE — Telephone Encounter (Signed)
Patient will need appointment for weight check/follow up for any further refills of Phentermine. Please contact her to get her scheduled.

## 2022-05-19 ENCOUNTER — Encounter: Payer: Self-pay | Admitting: Family

## 2022-05-19 ENCOUNTER — Other Ambulatory Visit (HOSPITAL_BASED_OUTPATIENT_CLINIC_OR_DEPARTMENT_OTHER): Payer: Self-pay

## 2022-05-19 NOTE — Telephone Encounter (Signed)
Please contact patient for scheduling. Needs an appointment for a follow up on weight and rx refill. Thanks in advance.

## 2022-05-19 NOTE — Telephone Encounter (Signed)
Patient scheduled for 06/17/22!

## 2022-05-24 ENCOUNTER — Encounter: Payer: Self-pay | Admitting: Family

## 2022-05-26 ENCOUNTER — Other Ambulatory Visit: Payer: Self-pay

## 2022-05-26 DIAGNOSIS — F411 Generalized anxiety disorder: Secondary | ICD-10-CM

## 2022-05-26 MED ORDER — METOPROLOL SUCCINATE ER 50 MG PO TB24: 50.0000 mg | ORAL_TABLET | Freq: Every day | ORAL | 1 refills | Status: DC

## 2022-05-26 MED ORDER — ESCITALOPRAM OXALATE 10 MG PO TABS: 10.0000 mg | ORAL_TABLET | Freq: Every day | ORAL | 1 refills | Status: DC

## 2022-06-10 ENCOUNTER — Other Ambulatory Visit: Payer: Self-pay

## 2022-06-17 ENCOUNTER — Ambulatory Visit: Payer: Managed Care, Other (non HMO) | Admitting: Medical-Surgical

## 2022-07-02 ENCOUNTER — Ambulatory Visit (INDEPENDENT_AMBULATORY_CARE_PROVIDER_SITE_OTHER): Payer: Managed Care, Other (non HMO)

## 2022-07-02 ENCOUNTER — Other Ambulatory Visit: Payer: Self-pay | Admitting: Medical-Surgical

## 2022-07-02 DIAGNOSIS — Z1231 Encounter for screening mammogram for malignant neoplasm of breast: Secondary | ICD-10-CM

## 2022-07-07 ENCOUNTER — Encounter: Payer: Self-pay | Admitting: Medical-Surgical

## 2022-07-07 MED ORDER — ZOLPIDEM TARTRATE 5 MG PO TABS: 5.0000 mg | ORAL_TABLET | Freq: Every evening | ORAL | 1 refills | Status: DC | PRN

## 2022-07-07 MED ORDER — CYCLOBENZAPRINE HCL 10 MG PO TABS: 10.0000 mg | ORAL_TABLET | Freq: Three times a day (TID) | ORAL | 1 refills | Status: DC | PRN

## 2022-09-09 ENCOUNTER — Other Ambulatory Visit: Payer: Self-pay | Admitting: Physician Assistant

## 2022-09-09 NOTE — Telephone Encounter (Signed)
Refill sent.  Patient will need to plan for an upcoming follow-up in the next 3-4 weeks.  Please contact her to facilitate scheduling.

## 2022-09-09 NOTE — Telephone Encounter (Signed)
Last OV: 04/01/22 Next OV: none scheduled Last RF: 07/07/22

## 2022-09-10 NOTE — Telephone Encounter (Signed)
Patient scheduled for 10/08/2022 @8 :30. tvt

## 2022-10-07 NOTE — Progress Notes (Unsigned)
        Established patient visit  History, exam, impression, and plan:  Primary female hypogonadism Up-to-date testosterone labs.  Due for his next testosterone injection.  Doing well on his current regimen with stable vitals.  Testosterone cypionate 100 mg IM given x 1 in the office.  Due for next shot in 2 weeks.  Lump of skin of lower extremity, left Noted a lump on the left posterior calf approximately 1 month ago.  Initially, had redness, tenderness, swelling at the area however this has fully resolved.  No longer has any residual symptoms but the lump has remained.  His wife noticed it and urged him to be seen to evaluate it.  History notable for varicose veins, compliant with recommendations for compression socks daily.  No recent injury or trauma to the area.  Unclear etiology.  The lump is approximately 1 cm x 1.5 cm and slightly discolored.  See clinical photo.  Plan to get vascular ultrasound as it does seem to be connected to one of the varicose veins that runs through the area.  Suspect superficial thrombophlebitis initially.  Discussed conservative measures with Tylenol, heat, ice, and compression socks.    Procedures performed this visit: None.  Return in about 2 weeks (around 09/17/2022) for nurse visit for testosterone shot.  __________________________________ Aarthi Uyeno L. Nirvana Blanchett, DNP, APRN, FNP-BC Primary Care and Sports Medicine [facility]  

## 2022-10-08 ENCOUNTER — Ambulatory Visit: Payer: Managed Care, Other (non HMO) | Admitting: Medical-Surgical

## 2022-10-08 ENCOUNTER — Encounter: Payer: Self-pay | Admitting: Medical-Surgical

## 2022-10-08 VITALS — BP 126/86 | HR 79 | Ht 64.0 in | Wt 225.0 lb

## 2022-10-08 DIAGNOSIS — R5383 Other fatigue: Secondary | ICD-10-CM

## 2022-10-08 DIAGNOSIS — G4700 Insomnia, unspecified: Secondary | ICD-10-CM

## 2022-10-08 DIAGNOSIS — R946 Abnormal results of thyroid function studies: Secondary | ICD-10-CM

## 2022-10-08 DIAGNOSIS — F411 Generalized anxiety disorder: Secondary | ICD-10-CM | POA: Diagnosis not present

## 2022-10-08 DIAGNOSIS — R7303 Prediabetes: Secondary | ICD-10-CM

## 2022-10-08 DIAGNOSIS — I10 Essential (primary) hypertension: Secondary | ICD-10-CM | POA: Diagnosis not present

## 2022-10-08 DIAGNOSIS — E538 Deficiency of other specified B group vitamins: Secondary | ICD-10-CM | POA: Diagnosis not present

## 2022-10-08 LAB — CBC WITH DIFFERENTIAL/PLATELET
HCT: 42.9 % (ref 35.0–45.0)
Hemoglobin: 14.3 g/dL (ref 11.7–15.5)
Platelets: 376 10*3/uL (ref 140–400)

## 2022-10-08 MED ORDER — ZOLPIDEM TARTRATE 5 MG PO TABS: 5.0000 mg | ORAL_TABLET | Freq: Every evening | ORAL | 1 refills | Status: DC | PRN

## 2022-10-08 MED ORDER — ESCITALOPRAM OXALATE 10 MG PO TABS
10.0000 mg | ORAL_TABLET | Freq: Every day | ORAL | 3 refills | Status: DC
Start: 2022-10-08 — End: 2023-10-08

## 2022-10-08 MED ORDER — CYCLOBENZAPRINE HCL 10 MG PO TABS: 10.0000 mg | ORAL_TABLET | Freq: Three times a day (TID) | ORAL | 1 refills | Status: DC | PRN

## 2022-10-08 MED ORDER — METOPROLOL SUCCINATE ER 50 MG PO TB24: 50.0000 mg | ORAL_TABLET | Freq: Every day | ORAL | 3 refills | Status: DC

## 2022-10-09 LAB — COMPLETE METABOLIC PANEL WITH GFR
AG Ratio: 1.5 (calc) (ref 1.0–2.5)
ALT: 31 U/L — ABNORMAL HIGH (ref 6–29)
AST: 31 U/L (ref 10–35)
Albumin: 4 g/dL (ref 3.6–5.1)
Alkaline phosphatase (APISO): 115 U/L (ref 37–153)
BUN/Creatinine Ratio: 10 (calc) (ref 6–22)
BUN: 10 mg/dL (ref 7–25)
CO2: 24 mmol/L (ref 20–32)
Calcium: 9.2 mg/dL (ref 8.6–10.4)
Chloride: 110 mmol/L (ref 98–110)
Creat: 1.04 mg/dL — ABNORMAL HIGH (ref 0.50–1.03)
Globulin: 2.6 g/dL (calc) (ref 1.9–3.7)
Glucose, Bld: 90 mg/dL (ref 65–99)
Potassium: 4.5 mmol/L (ref 3.5–5.3)
Sodium: 143 mmol/L (ref 135–146)
Total Bilirubin: 0.4 mg/dL (ref 0.2–1.2)
Total Protein: 6.6 g/dL (ref 6.1–8.1)
eGFR: 64 mL/min/{1.73_m2} (ref >=60)

## 2022-10-09 LAB — CBC WITH DIFFERENTIAL/PLATELET
Absolute Monocytes: 437 cells/uL (ref 200–950)
Basophils Absolute: 67 cells/uL (ref 0–200)
Basophils Relative: 1.2 %
Eosinophils Absolute: 168 cells/uL (ref 15–500)
Eosinophils Relative: 3 %
Lymphs Abs: 2145 cells/uL (ref 850–3900)
MCH: 30.7 pg (ref 27.0–33.0)
MCHC: 33.3 g/dL (ref 32.0–36.0)
MCV: 92.1 fL (ref 80.0–100.0)
MPV: 10.1 fL (ref 7.5–12.5)
Monocytes Relative: 7.8 %
Neutro Abs: 2783 cells/uL (ref 1500–7800)
Neutrophils Relative %: 49.7 %
RBC: 4.66 10*6/uL (ref 3.80–5.10)
RDW: 13.7 % (ref 11.0–15.0)
Total Lymphocyte: 38.3 %
WBC: 5.6 10*3/uL (ref 3.8–10.8)

## 2022-10-09 LAB — LIPID PANEL
Cholesterol: 216 mg/dL — ABNORMAL HIGH (ref <200)
HDL: 38 mg/dL — ABNORMAL LOW (ref >=50)
LDL Cholesterol (Calc): 148 mg/dL (calc) — ABNORMAL HIGH
Non-HDL Cholesterol (Calc): 178 mg/dL (calc) — ABNORMAL HIGH (ref <130)
Total CHOL/HDL Ratio: 5.7 (calc) — ABNORMAL HIGH (ref <5.0)
Triglycerides: 166 mg/dL — ABNORMAL HIGH (ref <150)

## 2022-10-09 LAB — VITAMIN B12: Vitamin B-12: 231 pg/mL (ref 200–1100)

## 2022-10-09 LAB — IRON,TIBC AND FERRITIN PANEL
%SAT: 25 % (calc) (ref 16–45)
Ferritin: 20 ng/mL (ref 16–232)
Iron: 92 ug/dL (ref 45–160)
TIBC: 370 mcg/dL (calc) (ref 250–450)

## 2022-10-09 LAB — TSH: TSH: 4.01 mIU/L

## 2022-10-09 LAB — HEMOGLOBIN A1C
Hgb A1c MFr Bld: 5.9 % of total Hgb — ABNORMAL HIGH (ref <5.7)
Mean Plasma Glucose: 123 mg/dL
eAG (mmol/L): 6.8 mmol/L

## 2022-12-02 ENCOUNTER — Encounter: Payer: Self-pay | Admitting: Medical-Surgical

## 2022-12-02 ENCOUNTER — Ambulatory Visit: Payer: Managed Care, Other (non HMO) | Admitting: Medical-Surgical

## 2022-12-02 VITALS — BP 96/69 | HR 68 | Resp 20 | Ht 64.0 in | Wt 230.5 lb

## 2022-12-02 DIAGNOSIS — R202 Paresthesia of skin: Secondary | ICD-10-CM

## 2022-12-02 DIAGNOSIS — M79604 Pain in right leg: Secondary | ICD-10-CM | POA: Diagnosis not present

## 2022-12-02 DIAGNOSIS — M79605 Pain in left leg: Secondary | ICD-10-CM

## 2022-12-02 DIAGNOSIS — R609 Edema, unspecified: Secondary | ICD-10-CM

## 2022-12-02 DIAGNOSIS — L409 Psoriasis, unspecified: Secondary | ICD-10-CM | POA: Diagnosis not present

## 2022-12-02 DIAGNOSIS — G5603 Carpal tunnel syndrome, bilateral upper limbs: Secondary | ICD-10-CM

## 2022-12-02 MED ORDER — DULOXETINE HCL 30 MG PO CPEP: 30.0000 mg | ORAL_CAPSULE | Freq: Every day | ORAL | 0 refills | Status: DC

## 2022-12-02 NOTE — Progress Notes (Signed)
        Established patient visit  History, exam, impression, and plan:  1. Psoriasis 2. Swelling 3. Pain in both lower extremities 4. Paresthesia of lower extremity Pleasant 57 year old female presenting today with a history of psoriasis as well as lower extremity edema, arthralgias, and fatigue.  She is currently followed by dermatology for psoriasis and has been started on Tremfya however has not had great results after the loading dose.  Will be due for her next dose soon.  Given her symptoms of multiple arthralgias, edema, and known diagnosis of psoriasis, her dermatologist did question if she had psoriatic arthritis.  No prior diagnosis of this and she has not been able to see rheumatology.  Today notes that her lower extremities are continuing to swell and she is now experiencing numbness and tingling bilaterally.  Also has severe pain that affects the lateral aspects of both legs from the hip to the thigh.  Describes this pain as burning and a tearing sensation.  Intolerant of steroids, NSAIDs, Lyrica, gabapentin, and Percocet.  Not currently taking anything for pain but notes her discomfort is greatly limiting her mobility.  She does work from home at [employer] job and is mostly sedentary.  Discussed that there may be multiple factors involved in her current symptoms.  Consider possible drug reaction to Tremfya, bilateral IT band syndrome, fibromyalgia, chronic myofascial pain, or autoimmune disorder.  Recent CBC and CMP checked with basic labs.  Adding ANA, rheumatoid factor, anti-CCP, HLA-B27, and inflammatory markers for further evaluation.  Referring to rheumatology.  We did discuss pain management efforts today.  She is willing to try anything to get some relief.  Plan to start Cymbalta 30 mg daily and work on home exercises.  Exercises printed and provided to patient.  We did briefly discuss referral to physical therapy however she would like to try home exercises first. - Ambulatory referral  to Rheumatology - ANA - Rheumatoid Factor - Cyclic citrul peptide antibody, IgG - HLA-B27 antigen - Sed Rate (ESR) - C-reactive protein  5. 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  Having bilateral hand paresthesias that affect the first 3 digits.  Phalen's and Tinel's test positive bilaterally.  Starting Cymbalta as above.  Recommend nighttime bracing and home exercises.  Exercises printed and provided to patient.  Procedures performed this visit: None.  Return in about 4 weeks (around 12/30/2022) for Leg pain/and numbness follow-up.  __________________________________ Thayer Ohm, DNP, APRN, FNP-BC Primary Care and Sports Medicine [facility]

## 2022-12-03 LAB — RHEUMATOID FACTOR: Rheumatoid fact SerPl-aCnc: <10 IU/mL (ref <14)

## 2022-12-03 LAB — SEDIMENTATION RATE: Sed Rate: 14 mm/h (ref 0–30)

## 2022-12-03 LAB — HLA-B27 ANTIGEN: HLA-B27 Antigen: NEGATIVE

## 2022-12-04 LAB — C-REACTIVE PROTEIN: CRP: 5.2 mg/L (ref <8.0)

## 2022-12-04 LAB — ANA: Anti Nuclear Antibody (ANA): NEGATIVE

## 2022-12-04 LAB — CYCLIC CITRUL PEPTIDE ANTIBODY, IGG: Cyclic Citrullin Peptide Ab: <16 UNITS

## 2022-12-10 NOTE — Progress Notes (Signed)
Office Visit Note  Patient: Stacy Woods             Date of Birth: December 09, 1965           MRN: [id number]             PCP: Christen Butter, NP Referring: Christen Butter, NP Visit Date: 12/15/2022 Occupation: @GUAROCC @  Subjective:  Psoriasis and joint pain  History of Present Illness: Stacy Woods is a 57 y.o. female seen in consultation per request of her PCP.  According to the patient she was diagnosed with psoriasis when she was 52.  She developed rash on her legs.  She was seen by dermatologist and had a biopsy and was diagnosed with psoriasis but on the basis of biopsy.  She was placed on Otezla and after 1 year her psoriasis went into remission.  She states due to the insurance changes she had to come off [place] in May 2023.  In July 2023 she had recurrence of psoriasis.  At the time her dermatologist closed his practice that she had to see her PCP who restarted Otezla in September 2023 but patient had no response to [place].  She was evaluated by dermatologist in [facility] in February 2024 and was a started on Tremfya in March 2024 she had been on Tremfya since then and her last dose of Tremfya was on December 03, 2022.  She has noted minimal improvement in the psoriasis so far.  She has psoriasis limited to her lower extremities only.  She has had joint pain for many years.  She rides motorcycles and also horses.  She has had knee issues since 2002.  She recalls having left knee joint meniscal tear repair in 2002 and did well.  In 2021 she started having viscosupplement injections to her knee joints due to discomfort.  She had MRI of her knee joint which showed cyst in the meniscus requiring another arthroscopic surgery.  After that she was diagnosed with severe osteoarthritis in her left knee joint.  She sees an orthopedic surgeon on a regular basis.  She states she also feels some discomfort in her cervical spine and her lumbar spine.  She has been seen by neurologist in the past and was diagnosed with  occipital neurology is requiring shots.  She describes discomfort in her elbows, hands, trochanteric region, ankles and her heels.  She denies any history of Achilles tendinitis of Planter fasciitis.  Is no history of uveitis, shortness of breath or palpitations.  She notices some swelling in her hands.  She also notices tingling and numbness in her bilateral first second and third fingers.  She works as a Educational psychologist at [employer].  She types all day.  There is no family history of psoriasis or IBD.  She is gravida 2 para 2.    Activities of Daily Living:  Patient reports morning stiffness for 1.5 hours.   Patient Reports nocturnal pain.  Difficulty dressing/grooming: Denies Difficulty climbing stairs: Reports Difficulty getting out of chair: Reports Difficulty using hands for taps, buttons, cutlery, and/or writing: Reports  Review of Systems  Constitutional:  Positive for fatigue.  HENT:  Positive for mouth dryness. Negative for mouth sores.   Eyes:  Positive for dryness.  Respiratory:  Positive for wheezing. Negative for cough and shortness of breath.   Cardiovascular:  Negative for chest pain and palpitations.  Gastrointestinal:  Negative for blood in stool, constipation and diarrhea.  Endocrine: Negative for increased urination.  Genitourinary:  Negative for involuntary urination.  Musculoskeletal:  Positive for joint pain, gait problem, joint pain, joint swelling, myalgias, muscle weakness, morning stiffness, muscle tenderness and myalgias.  Skin:  Positive for rash. Negative for color change, hair loss and sensitivity to sunlight.  Allergic/Immunologic: Positive for susceptible to infections.  Neurological:  Negative for dizziness and headaches.  Hematological:  Negative for swollen glands.  Psychiatric/Behavioral:  Positive for depressed mood. Negative for sleep disturbance. The patient is not nervous/anxious.     PMFS History:  Patient Active Problem  List   Diagnosis Date Noted   Bilateral lower extremity edema 09/26/2021   Avulsion fracture of left wrist 09/24/2021   Chondromalacia patellae, left knee 11/23/2020   Osteoarthritis of left knee 11/23/2020   Cyst of lateral meniscus of left knee 11/23/2020   Morning joint stiffness 08/28/2019   Constipation 08/28/2019   Right shoulder pain 07/27/2019   Glaucoma 07/21/2019   Transaminitis 12/01/2017   MDD (recurrent major depressive disorder) in remission (HCC) 11/03/2017   AK (actinic keratosis) 07/14/2017   Low magnesium level 01/12/2017   B12 deficiency 01/01/2017   Trochanteric bursitis of left hip 10/08/2016   Malabsorption of iron 05/09/2016   Arthritis of knee, degenerative 04/14/2016   Surgical menopause 03/24/2016   Family history of breast cancer 03/24/2016   Primary osteoarthritis of both knees 10/01/2015   Hypoglycemia 06/12/2015   Cubital tunnel syndrome on right 09/14/2014   Degenerative disc disease, cervical 07/27/2014   Fibromyalgia 04/11/2013   Essential hypertension, benign 04/11/2013   Anxiety state 04/11/2013   Well adult exam 11/09/2012   Cellulitis 08/15/2012   Iron deficiency anemia 09/04/2011   Palpitations 09/04/2011   Fatigue 09/04/2011   History of gastric bypass 09/03/2011   Insomnia 08/16/2011   Osteoarthrosis, unspecified whether generalized or localized, lower leg 08/16/2011   Myalgia and myositis, unspecified 08/16/2011   Acquired absence of organ, genital organs 08/16/2011   Irritable bowel syndrome 08/16/2011   Seborrheic dermatitis 08/16/2011   Mixed hyperlipidemia 08/16/2011   Morbid obesity (HCC) 08/16/2011    Past Medical History:  Diagnosis Date   AK (actinic keratosis) 07/14/2017   Right upper chest wall   Anemia    Anemia, iron deficiency 05/02/2015   Fibromyalgia    History of gastric bypass 05/09/2016   Hypertension    Insomnia 08/14/2017   Malabsorption of iron 05/09/2016   PONV (postoperative nausea and vomiting)     Prediabetes 09/01/2013   March 2015    Seizure (HCC) 2018   from low blood sugar    Surgical menopause 03/24/2016    Family History  Problem Relation Age of Onset   Hypertension Mother    Breast cancer Mother    Diabetes Father    Hypertension Father    Stroke Father    Heart failure Father    CAD Father        has stents   Migraines Sister    Healthy Son    Healthy Daughter    Colon cancer Neg Hx    Colon polyps Neg Hx    Esophageal cancer Neg Hx    Stomach cancer Neg Hx    Rectal cancer Neg Hx    Past Surgical History:  Procedure Laterality Date   ABDOMINAL HYSTERECTOMY  06/10/1995   APPENDECTOMY     CESAREAN SECTION  [id number]   CHOLECYSTECTOMY     CHONDROPLASTY Left 11/23/2020   Procedure: CHONDROPLASTY MEDIAL FEMORAL PATELLA;  Surgeon: Jodi Geralds, MD;  Location: [facility];  Service: Orthopedics;  Laterality: Left;   GASTRIC BYPASS  06/09/2009   KNEE ARTHROSCOPY Left 06/09/2000   KNEE ARTHROSCOPY Left 10/2021   KNEE ARTHROSCOPY WITH EXCISION BAKER'S CYST Left 11/23/2020   Procedure: KNEE ARTHROSCOPY WITH ARTHROSCOPIC CYST EXCISION;  Surgeon: Jodi Geralds, MD;  Location: [facility];  Service: Orthopedics;  Laterality: Left;   KNEE ARTHROSCOPY WITH LATERAL RELEASE Left 11/23/2020   Procedure: ARTHROSCOPY KNEE WITH LATERAL RETINACULAR RELEASE, PARTIAL LATERAL MENISCECTOMY;  Surgeon: Jodi Geralds, MD;  Location: [facility];  Service: Orthopedics;  Laterality: Left;   TONSILLECTOMY  06/10/1975   ULNAR NERVE TRANSPOSITION Right 06/12/2015   Procedure: RIGHT CUBITAL TUNNEL RELEASE;  Surgeon: Mack Hook, MD;  Location: [facility];  Service: Orthopedics;  Laterality: Right;   Social History   Social History Narrative   Lives alone   Right-handed   Caffeine: <16 oz soda daily   Immunization History  Administered Date(s) Administered   Hepatitis B 10/20/2021   Hepb-cpg 10/10/2021   Influenza Whole  03/29/2013   Influenza,inj,Quad PF,6+ Mos 03/24/2016, 03/23/2017, 03/10/2018, 02/04/2019, 03/02/2020, 02/14/2021, 04/01/2022   Influenza-Unspecified 03/09/2010, 03/10/2015   PFIZER(Purple Top)SARS-COV-2 Vaccination 06/24/2019, 07/13/2019, 03/27/2020   Td 06/10/2006   Tdap 03/24/2016   Tetanus Immune Globulin 06/09/1998   Zoster Recombinant(Shingrix) 04/19/2018, 07/05/2018     Objective: Vital Signs: BP 120/84 (BP Location: Right Arm, Patient Position: Sitting, Cuff Size: Normal)   Pulse 70   Resp 17   Ht 5\' 4"  (1.626 m)   Wt 230 lb 9.6 oz (104.6 kg)   BMI 39.58 kg/m    Physical Exam Vitals and nursing note reviewed.  Constitutional:      Appearance: She is well-developed.  HENT:     Head: Normocephalic and atraumatic.  Eyes:     Conjunctiva/sclera: Conjunctivae normal.  Cardiovascular:     Rate and Rhythm: Normal rate and regular rhythm.     Heart sounds: Normal heart sounds.  Pulmonary:     Effort: Pulmonary effort is normal.     Breath sounds: Normal breath sounds.  Abdominal:     General: Bowel sounds are normal.     Palpations: Abdomen is soft.  Musculoskeletal:     Cervical back: Normal range of motion.  Lymphadenopathy:     Cervical: No cervical adenopathy.  Skin:    General: Skin is warm and dry.     Capillary Refill: Capillary refill takes less than 2 seconds.     Comments: Psoriasis patches were noted over bilateral shins.  Neurological:     Mental Status: She is alert and oriented to person, place, and time.  Psychiatric:        Behavior: Behavior normal.      Musculoskeletal Exam: Cervical, thoracic and lumbar spine were in good range of motion.  She had some discomfort with range of motion of lumbar spine.  She had tenderness on palpation over SI joints.  Shoulder joints, elbow joints, wrist joints, MCPs PIPs and DIPs were in good range of motion with no synovitis.  She had tenderness over bilateral CMC joints.  Phalen's and Tinel's test were positive.   Hip joints, knee joints, ankles, MTPs and PIPs were in good range of motion.  No synovitis was noted.  She had bilateral pes cavus and hammertoes.  No dactylitis, Planter fasciitis or Achilles tendinitis was noted.  CDAI Exam: CDAI Score: -- Patient Global: --; Provider Global: -- Swollen: --; Tender: -- Joint Exam 12/15/2022   No joint  exam has been documented for this visit   There is currently no information documented on the homunculus. Go to the Rheumatology activity and complete the homunculus joint exam.  Investigation: No additional findings.  Imaging: No results found.  Recent Labs: Lab Results  Component Value Date   WBC 5.6 10/08/2022   HGB 14.3 10/08/2022   PLT 376 10/08/2022   NA 143 10/08/2022   K 4.5 10/08/2022   CL 110 10/08/2022   CO2 24 10/08/2022   GLUCOSE 90 10/08/2022   BUN 10 10/08/2022   CREATININE 1.04 (H) 10/08/2022   BILITOT 0.4 10/08/2022   ALKPHOS 105 07/24/2020   AST 31 10/08/2022   ALT 31 (H) 10/08/2022   PROT 6.6 10/08/2022   ALBUMIN 4.7 07/24/2020   CALCIUM 9.2 10/08/2022   GFRAA 78 06/20/2020    Speciality Comments: No specialty comments available.  Procedures:  No procedures performed Allergies: Lyrica [pregabalin], Percocet [oxycodone-acetaminophen], Mobic [meloxicam], Naproxen, Nsaids, Prednisone, and Shellfish allergy   Assessment / Plan:     Visit Diagnoses: Psoriasis-patient had extensive psoriasis over bilateral shins which started about 4 years ago.  Patient was initially treated with [facility] and went into remission after 1 year.  She had to interrupt [place] due to insurance issues.  She restarted Orencia but it was not effective.  She was switched to [facility] in March 2024.  She has been taking Tremfya injections on a regular basis since then.  She has not noticed much improvement so far.  I did detailed discussion with the patient that she should give Tremfya about 6 months before thinking of switching therapy as it works  slowly.  Patient voiced understanding.  She has been followed by Dr. Burnis Kingfisher, a dermatologist in [place].  Pain in both hands -she complains of pain and discomfort in her bilateral hands.  She describes discomfort mostly over the [facility] joints.  No synovitis or dactylitis was noted.  Nails could not be examined as she had nail polish.  Plan: XR Hand 2 View Right, XR Hand 2 View Left.  X-rays of bilateral hands were suggestive of osteoarthritis.  I also discussed future ultrasound to look for synovitis if she has ongoing discomfort in her hands.  Paresthesia of both hands-she complains of paresthesias send bilateral first second and third fingers.  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  I will send a referral for nerve conduction velocities.  Avulsion fracture of left wrist - 2023 , no surgery needed.  Polyarthralgia- 10/08/22: iron panel WNL, TSH 4.01, vitamin B12 231. 12/02/22: ANA negative, RF-, Anti-CCP-, HLA-B27-, ESR WNL, CRP WNL.  I reviewed labs with the patient.  Her labs were unremarkable.  Sed rate and CRP were normal.  I do not see synovitis on the examination today.  Degenerative disc disease, cervical-patient states she has some degenerative changes in her cervical spine.  She has some stiffness with range of motion of her neck.  Paresthesia of lower extremity-she complains of paresthesias in the lateral aspect of her thighs which is most likely related to trochanteric bursitis.  Trochanteric bursitis of left hip-she had left trochanteric bursitis on the examination today.  With tenderness over left trochanteric bursa.  Patient states she is unable to sleep on her side and sleeps on her back mostly.  She would benefit from physical therapy.  She lives in [place].  I advised her to get the physical therapy referral through her PCP locally.  She may also benefit from  water therapy, swimming and stretching.  A handout on IT band stretches was placed in the  AVS.  Primary osteoarthritis of both knees-patient states she has been followed by orthopedic surgeon closely.  She has had viscosupplement injections in the past.  Has been told that she has severe end-stage osteoarthritis of her left knee.  No warmth swelling or effusion was noted.  Pain in both feet -she complains of discomfort in her bilateral feet.  No synovitis or dactylitis was noted.  There was no Planter fasciitis or Achilles tendinitis.  Plan: XR Foot 2 Views Right, XR Foot 2 Views Left.  X-rays were suggestive of osteoarthritis of the feet.  Pes cavus of both feet-she had bilateral pes cavus and hammertoes.  I will refer her to podiatry for evaluation and orthotics.  Chronic midline low back pain without sciatica -she can pains of ongoing lower back discomfort.  She had good range of motion.  She has some tenderness over the lower lumbar region.  Plan: XR Lumbar Spine 2-3 Views.  Facet joint arthropathy was noted.  No significant disc space narrowing was noted.  A handout on lower back stretches was placed in the AVS.  Chronic SI joint pain -she had mild tenderness over SI joints.  Although she denies any morning stiffness or chronic SI joint discomfort on a regular basis.  Plan: XR Pelvis 1-2 Views.  No SI joint narrowing or sclerosis was noted.  No hip joint narrowing was noted.  Fibromyalgia-patient was diagnosed with fibromyalgia syndrome in 2003.  She states she has generalized pain hyperalgesia and positive tender points.  She has tried several medications in the past and could not tolerate them.  She is currently on Cymbalta 30 mg p.o. daily.  She also takes Lexapro.  She is on Flexeril 10 mg p.o. 3 times daily as needed for muscle spasms.  Benefits from physical therapy, water therapy, water aerobics, swimming and stretching were emphasized.  Essential hypertension, benign-blood pressure was 120/84 today.  She is on metoprolol.  Mixed hyperlipidemia-she is currently not taking any  medications.  Increased risk of heart disease with psoriasis was also discussed.  Dietary modifications were discussed and information on DASH diet was placed in the AVS.  History of gastric bypass - 2001  Malabsorption of iron  History of IBS  AK (actinic keratosis)  MDD (recurrent major depressive disorder) in remission (HCC)-patient states her depression is mild and is related to psoriasis.  She is currently on Lexapro and Cymbalta.  Family history of breast cancer-mother  Orders: Orders Placed This Encounter  Procedures   XR Hand 2 View Right   XR Hand 2 View Left   XR Foot 2 Views Right   XR Foot 2 Views Left   XR Lumbar Spine 2-3 Views   XR Pelvis 1-2 Views   Ambulatory referral to Podiatry   Ambulatory referral to Physical Medicine Rehab   No orders of the defined types were placed in this encounter.  Face-to-face time spent patient was over 60 minutes.  Greater than 50% time was spent in counseling and coordination of care.  Follow-Up Instructions: Return for Polyarthralgia, psoriasis.   Pollyann Savoy, MD  Note - This record has been created using Animal nutritionist.  Chart creation errors have been sought, but may not always  have been located. Such creation errors do not reflect on  the standard of medical care.

## 2022-12-15 ENCOUNTER — Ambulatory Visit: Payer: Managed Care, Other (non HMO)

## 2022-12-15 ENCOUNTER — Ambulatory Visit (INDEPENDENT_AMBULATORY_CARE_PROVIDER_SITE_OTHER): Payer: Managed Care, Other (non HMO)

## 2022-12-15 ENCOUNTER — Ambulatory Visit: Payer: Managed Care, Other (non HMO) | Attending: Rheumatology | Admitting: Rheumatology

## 2022-12-15 ENCOUNTER — Encounter: Payer: Self-pay | Admitting: Family

## 2022-12-15 ENCOUNTER — Encounter: Payer: Self-pay | Admitting: Rheumatology

## 2022-12-15 VITALS — BP 120/84 | HR 70 | Resp 17 | Ht 64.0 in | Wt 230.6 lb

## 2022-12-15 DIAGNOSIS — M79641 Pain in right hand: Secondary | ICD-10-CM

## 2022-12-15 DIAGNOSIS — M2242 Chondromalacia patellae, left knee: Secondary | ICD-10-CM

## 2022-12-15 DIAGNOSIS — M545 Low back pain, unspecified: Secondary | ICD-10-CM | POA: Diagnosis not present

## 2022-12-15 DIAGNOSIS — L409 Psoriasis, unspecified: Secondary | ICD-10-CM | POA: Diagnosis not present

## 2022-12-15 DIAGNOSIS — Z8719 Personal history of other diseases of the digestive system: Secondary | ICD-10-CM

## 2022-12-15 DIAGNOSIS — Z9884 Bariatric surgery status: Secondary | ICD-10-CM

## 2022-12-15 DIAGNOSIS — M79671 Pain in right foot: Secondary | ICD-10-CM

## 2022-12-15 DIAGNOSIS — S62102A Fracture of unspecified carpal bone, left wrist, initial encounter for closed fracture: Secondary | ICD-10-CM

## 2022-12-15 DIAGNOSIS — R002 Palpitations: Secondary | ICD-10-CM

## 2022-12-15 DIAGNOSIS — M79642 Pain in left hand: Secondary | ICD-10-CM

## 2022-12-15 DIAGNOSIS — M5030 Other cervical disc degeneration, unspecified cervical region: Secondary | ICD-10-CM

## 2022-12-15 DIAGNOSIS — E782 Mixed hyperlipidemia: Secondary | ICD-10-CM

## 2022-12-15 DIAGNOSIS — M79605 Pain in left leg: Secondary | ICD-10-CM

## 2022-12-15 DIAGNOSIS — G8929 Other chronic pain: Secondary | ICD-10-CM

## 2022-12-15 DIAGNOSIS — M2550 Pain in unspecified joint: Secondary | ICD-10-CM

## 2022-12-15 DIAGNOSIS — L570 Actinic keratosis: Secondary | ICD-10-CM

## 2022-12-15 DIAGNOSIS — M533 Sacrococcygeal disorders, not elsewhere classified: Secondary | ICD-10-CM

## 2022-12-15 DIAGNOSIS — R202 Paresthesia of skin: Secondary | ICD-10-CM

## 2022-12-15 DIAGNOSIS — F3340 Major depressive disorder, recurrent, in remission, unspecified: Secondary | ICD-10-CM

## 2022-12-15 DIAGNOSIS — Q6671 Congenital pes cavus, right foot: Secondary | ICD-10-CM

## 2022-12-15 DIAGNOSIS — Z803 Family history of malignant neoplasm of breast: Secondary | ICD-10-CM

## 2022-12-15 DIAGNOSIS — I10 Essential (primary) hypertension: Secondary | ICD-10-CM

## 2022-12-15 DIAGNOSIS — K909 Intestinal malabsorption, unspecified: Secondary | ICD-10-CM

## 2022-12-15 DIAGNOSIS — R609 Edema, unspecified: Secondary | ICD-10-CM

## 2022-12-15 DIAGNOSIS — Q6672 Congenital pes cavus, left foot: Secondary | ICD-10-CM

## 2022-12-15 DIAGNOSIS — M797 Fibromyalgia: Secondary | ICD-10-CM

## 2022-12-15 DIAGNOSIS — M79672 Pain in left foot: Secondary | ICD-10-CM

## 2022-12-15 DIAGNOSIS — M7062 Trochanteric bursitis, left hip: Secondary | ICD-10-CM

## 2022-12-15 DIAGNOSIS — M170 Bilateral primary osteoarthritis of knee: Secondary | ICD-10-CM

## 2022-12-15 DIAGNOSIS — M23001 Cystic meniscus, unspecified lateral meniscus, left knee: Secondary | ICD-10-CM

## 2022-12-15 NOTE — Patient Instructions (Addendum)
DASH Eating Plan DASH stands for Dietary Approaches to Stop Hypertension. The DASH eating plan is a healthy eating plan that has been shown to: Lower high blood pressure (hypertension). Reduce your risk for type 2 diabetes, heart disease, and stroke. Help with weight loss. What are tips for following this plan? Reading food labels Check food labels for the amount of salt (sodium) per serving. Choose foods with less than 5 percent of the Daily Value (DV) of sodium. In general, foods with less than 300 milligrams (mg) of sodium per serving fit into this eating plan. To find whole grains, look for the word "whole" as the first word in the ingredient list. Shopping Buy products labeled as "low-sodium" or "no salt added." Buy fresh foods. Avoid canned foods and pre-made or frozen meals. Cooking Try not to add salt when you cook. Use salt-free seasonings or herbs instead of table salt or sea salt. Check with your health care provider or pharmacist before using salt substitutes. Do not fry foods. Cook foods in healthy ways, such as baking, boiling, grilling, roasting, or broiling. Cook using oils that are good for your heart. These include olive, canola, avocado, soybean, and sunflower oil. Meal planning  Eat a balanced diet. This should include: 4 or more servings of fruits and 4 or more servings of vegetables each day. Try to fill half of your plate with fruits and vegetables. 6-8 servings of whole grains each day. 6 or less servings of lean meat, poultry, or fish each day. 1 oz is 1 serving. A 3 oz (85 g) serving of meat is about the same size as the palm of your hand. One egg is 1 oz (28 g). 2-3 servings of low-fat dairy each day. One serving is 1 cup (237 mL). 1 serving of nuts, seeds, or beans 5 times each week. 2-3 servings of heart-healthy fats. Healthy fats called omega-3 fatty acids are found in foods such as walnuts, flaxseeds, fortified milks, and eggs. These fats are also found in  cold-water fish, such as sardines, salmon, and mackerel. Limit how much you eat of: Canned or prepackaged foods. Food that is high in trans fat, such as fried foods. Food that is high in saturated fat, such as fatty meat. Desserts and other sweets, sugary drinks, and other foods with added sugar. Full-fat dairy products. Do not salt foods before eating. Do not eat more than 4 egg yolks a week. Try to eat at least 2 vegetarian meals a week. Eat more home-cooked food and less restaurant, buffet, and fast food. Lifestyle When eating at a restaurant, ask if your food can be made with less salt or no salt. If you drink alcohol: Limit how much you have to: 0-1 drink a day if you are female. 0-2 drinks a day if you are female. Know how much alcohol is in your drink. In the [place] one drink is one 12 oz bottle of beer (355 mL), one 5 oz glass of wine (148 mL), or one 1 oz glass of hard liquor (44 mL). General information Avoid eating more than 2,300 mg of salt a day. If you have hypertension, you may need to reduce your sodium intake to 1,500 mg a day. Work with your provider to stay at a healthy body weight or lose weight. Ask what the best weight range is for you. On most days of the week, get at least 30 minutes of exercise that causes your heart to beat faster. This may include walking, swimming, or  biking. Work with your provider or dietitian to adjust your eating plan to meet your specific calorie needs. What foods should I eat? Fruits All fresh, dried, or frozen fruit. Canned fruits that are in their natural juice and do not have sugar added to them. Vegetables Fresh or frozen vegetables that are raw, steamed, roasted, or grilled. Low-sodium or reduced-sodium tomato and vegetable juice. Low-sodium or reduced-sodium tomato sauce and tomato paste. Low-sodium or reduced-sodium canned vegetables. Grains Whole-grain or whole-wheat bread. Whole-grain or whole-wheat pasta. Brown rice. Orpah Cobb. Bulgur. Whole-grain and low-sodium cereals. Pita bread. Low-fat, low-sodium crackers. Whole-wheat flour tortillas. Meats and other proteins Skinless chicken or [place]. Ground chicken or [place]. Pork with fat trimmed off. Fish and seafood. Egg whites. Dried beans, peas, or lentils. Unsalted nuts, nut butters, and seeds. Unsalted canned beans. Lean cuts of beef with fat trimmed off. Low-sodium, lean precooked or cured meat, such as sausages or meat loaves. Dairy Low-fat (1%) or fat-free (skim) milk. Reduced-fat, low-fat, or fat-free cheeses. Nonfat, low-sodium ricotta or cottage cheese. Low-fat or nonfat yogurt. Low-fat, low-sodium cheese. Fats and oils Soft margarine without trans fats. Vegetable oil. Reduced-fat, low-fat, or light mayonnaise and salad dressings (reduced-sodium). Canola, safflower, olive, avocado, soybean, and sunflower oils. Avocado. Seasonings and condiments Herbs. Spices. Seasoning mixes without salt. Other foods Unsalted popcorn and pretzels. Fat-free sweets. The items listed above may not be all the foods and drinks you can have. Talk to a dietitian to learn more. What foods should I avoid? Fruits Canned fruit in a light or heavy syrup. Fried fruit. Fruit in cream or butter sauce. Vegetables Creamed or fried vegetables. Vegetables in a cheese sauce. Regular canned vegetables that are not marked as low-sodium or reduced-sodium. Regular canned tomato sauce and paste that are not marked as low-sodium or reduced-sodium. Regular tomato and vegetable juices that are not marked as low-sodium or reduced-sodium. Rosita Fire. Olives. Grains Baked goods made with fat, such as croissants, muffins, or some breads. Dry pasta or rice meal packs. Meats and other proteins Fatty cuts of meat. Ribs. Fried meat. Tomasa Blase. Bologna, salami, and other precooked or cured meats, such as sausages or meat loaves, that are not lean and low in sodium. Fat from the back of a pig (fatback). Bratwurst.  Salted nuts and seeds. Canned beans with added salt. Canned or smoked fish. Whole eggs or egg yolks. Chicken or [place] with skin. Dairy Whole or 2% milk, cream, and half-and-half. Whole or full-fat cream cheese. Whole-fat or sweetened yogurt. Full-fat cheese. Nondairy creamers. Whipped toppings. Processed cheese and cheese spreads. Fats and oils Butter. Stick margarine. Lard. Shortening. Ghee. Bacon fat. Tropical oils, such as coconut, palm kernel, or palm oil. Seasonings and condiments Onion salt, garlic salt, seasoned salt, table salt, and sea salt. Worcestershire sauce. Tartar sauce. Barbecue sauce. Teriyaki sauce. Soy sauce, including reduced-sodium soy sauce. Steak sauce. Canned and packaged gravies. Fish sauce. Oyster sauce. Cocktail sauce. Store-bought horseradish. Ketchup. Mustard. Meat flavorings and tenderizers. Bouillon cubes. Hot sauces. Pre-made or packaged marinades. Pre-made or packaged taco seasonings. Relishes. Regular salad dressings. Other foods Salted popcorn and pretzels. The items listed above may not be all the foods and drinks you should avoid. Talk to a dietitian to learn more. Where to find more information National Heart, Lung, and Blood Institute (NHLBI): BuffaloDryCleaner.gl American Heart Association (AHA): heart.org Academy of Nutrition and Dietetics: eatright.org National Kidney Foundation (NKF): kidney.org This information is not intended to replace advice given to you by your health care provider. Make sure  you discuss any questions you have with your health care provider. Document Revised: 06/12/2022 Document Reviewed: 06/12/2022 Elsevier Patient Education  2024 [employer]. Iliotibial Band Syndrome Rehab Ask your health care provider which exercises are safe for you. Do exercises exactly as told by your health care provider and adjust them as directed. It is normal to feel mild stretching, pulling, tightness, or discomfort as you do these exercises. Stop right away  if you feel sudden pain or your pain gets significantly worse. Do not begin these exercises until told by your health care provider. Stretching and range-of-motion exercises These exercises warm up your muscles and joints and improve the movement and flexibility of your hip and pelvis. Quadriceps stretch, prone  Lie on your abdomen (prone position) on a firm surface, such as a bed or padded floor. Bend your left / right knee and reach back to hold your ankle or pant leg. If you cannot reach your ankle or pant leg, loop a belt around your foot and grab the belt instead. Gently pull your heel toward your buttocks. Your knee should not slide out to the side. You should feel a stretch in the front of your thigh and knee (quadriceps). Hold this position for __________ seconds. Repeat __________ times. Complete this exercise __________ times a day. Iliotibial band stretch An iliotibial band is a strong band of muscle tissue that runs from the outer side of your hip to the outer side of your thigh and knee. Lie on your side with your left / right leg in the top position. Bend both of your knees and grab your left / right ankle. Stretch out your bottom arm to help you balance. Slowly bring your top knee back so your thigh goes behind your trunk. Slowly lower your top leg toward the floor until you feel a gentle stretch on the outside of your left / right hip and thigh. If you do not feel a stretch and your knee will not fall farther, place the heel of your other foot on top of your knee and pull your knee down toward the floor with your foot. Hold this position for __________ seconds. Repeat __________ times. Complete this exercise __________ times a day. Strengthening exercises These exercises build strength and endurance in your hip and pelvis. Endurance is the ability to use your muscles for a long time, even after they get tired. Straight leg raises, side-lying This exercise strengthens the muscles  that rotate the leg at the hip and move it away from your body (hip abductors). Lie on your side with your left / right leg in the top position. Lie so your head, shoulder, hip, and knee line up. You may bend your bottom knee to help you balance. Roll your hips slightly forward so your hips are stacked directly over each other and your left / right knee is facing forward. Tense the muscles in your outer thigh and lift your top leg 4-6 inches (10-15 cm). Hold this position for __________ seconds. Slowly lower your leg to return to the starting position. Let your muscles relax completely before doing another repetition. Repeat __________ times. Complete this exercise __________ times a day. Leg raises, prone This exercise strengthens the muscles that move the hips backward (hip extensors). Lie on your abdomen (prone position) on your bed or a firm surface. You can put a pillow under your hips if that is more comfortable for your lower back. Bend your left / right knee so your foot is straight  up in the air. Squeeze your buttocks muscles and lift your left / right thigh off the bed. Do not let your back arch. Tense your thigh muscle as hard as you can without increasing any knee pain. Hold this position for __________ seconds. Slowly lower your leg to return to the starting position and allow it to relax completely. Repeat __________ times. Complete this exercise __________ times a day. Hip hike Stand sideways on a bottom step. Stand on your left / right leg with your other foot unsupported next to the step. You can hold on to a railing or wall for balance if needed. Keep your knees straight and your torso square. Then lift your left / right hip up toward the ceiling. Slowly let your left / right hip lower toward the floor, past the starting position. Your foot should get closer to the floor. Do not lean or bend your knees. Repeat __________ times. Complete this exercise __________ times a  day. This information is not intended to replace advice given to you by your health care provider. Make sure you discuss any questions you have with your health care provider. Document Revised: 08/03/2019 Document Reviewed: 08/03/2019 Elsevier Patient Education  2024 [employer] Inc. Low Back Sprain or Strain Rehab Ask your health care provider which exercises are safe for you. Do exercises exactly as told by your health care provider and adjust them as directed. It is normal to feel mild stretching, pulling, tightness, or discomfort as you do these exercises. Stop right away if you feel sudden pain or your pain gets worse. Do not begin these exercises until told by your health care provider. Stretching and range-of-motion exercises These exercises warm up your muscles and joints and improve the movement and flexibility of your back. These exercises also help to relieve pain, numbness, and tingling. Lumbar rotation  Lie on your back on a firm bed or the floor with your knees bent. Straighten your arms out to your sides so each arm forms a 90-degree angle (right angle) with a side of your body. Slowly move (rotate) both of your knees to one side of your body until you feel a stretch in your lower back (lumbar). Try not to let your shoulders lift off the floor. Hold this position for __________ seconds. Tense your abdominal muscles and slowly move your knees back to the starting position. Repeat this exercise on the other side of your body. Repeat __________ times. Complete this exercise __________ times a day. Single knee to chest  Lie on your back on a firm bed or the floor with both legs straight. Bend one of your knees. Use your hands to move your knee up toward your chest until you feel a gentle stretch in your lower back and buttock. Hold your leg in this position by holding on to the front of your knee. Keep your other leg as straight as possible. Hold this position for __________  seconds. Slowly return to the starting position. Repeat with your other leg. Repeat __________ times. Complete this exercise __________ times a day. Prone extension on elbows  Lie on your abdomen on a firm bed or the floor (prone position). Prop yourself up on your elbows. Use your arms to help lift your chest up until you feel a gentle stretch in your abdomen and your lower back. This will place some of your body weight on your elbows. If this is uncomfortable, try stacking pillows under your chest. Your hips should stay down, against the surface  that you are lying on. Keep your hip and back muscles relaxed. Hold this position for __________ seconds. Slowly relax your upper body and return to the starting position. Repeat __________ times. Complete this exercise __________ times a day. Strengthening exercises These exercises build strength and endurance in your back. Endurance is the ability to use your muscles for a long time, even after they get tired. Pelvic tilt This exercise strengthens the muscles that lie deep in the abdomen. Lie on your back on a firm bed or the floor with your legs extended. Bend your knees so they are pointing toward the ceiling and your feet are flat on the floor. Tighten your lower abdominal muscles to press your lower back against the floor. This motion will tilt your pelvis so your tailbone points up toward the ceiling instead of pointing to your feet or the floor. To help with this exercise, you may place a small towel under your lower back and try to push your back into the towel. Hold this position for __________ seconds. Let your muscles relax completely before you repeat this exercise. Repeat __________ times. Complete this exercise __________ times a day. Alternating arm and leg raises  Get on your hands and knees on a firm surface. If you are on a hard floor, you may want to use padding, such as an exercise mat, to cushion your knees. Line up your  arms and legs. Your hands should be directly below your shoulders, and your knees should be directly below your hips. Lift your left leg behind you. At the same time, raise your right arm and straighten it in front of you. Do not lift your leg higher than your hip. Do not lift your arm higher than your shoulder. Keep your abdominal and back muscles tight. Keep your hips facing the ground. Do not arch your back. Keep your balance carefully, and do not hold your breath. Hold this position for __________ seconds. Slowly return to the starting position. Repeat with your right leg and your left arm. Repeat __________ times. Complete this exercise __________ times a day. Abdominal set with straight leg raise  Lie on your back on a firm bed or the floor. Bend one of your knees and keep your other leg straight. Tense your abdominal muscles and lift your straight leg up, 4-6 inches (10-15 cm) off the ground. Keep your abdominal muscles tight and hold this position for __________ seconds. Do not hold your breath. Do not arch your back. Keep it flat against the ground. Keep your abdominal muscles tense as you slowly lower your leg back to the starting position. Repeat with your other leg. Repeat __________ times. Complete this exercise __________ times a day. Single leg lower with bent knees Lie on your back on a firm bed or the floor. Tense your abdominal muscles and lift your feet off the floor, one foot at a time, so your knees and hips are bent in 90-degree angles (right angles). Your knees should be over your hips and your lower legs should be parallel to the floor. Keeping your abdominal muscles tense and your knee bent, slowly lower one of your legs so your toe touches the ground. Lift your leg back up to return to the starting position. Do not hold your breath. Do not let your back arch. Keep your back flat against the ground. Repeat with your other leg. Repeat __________ times. Complete  this exercise __________ times a day. Posture and body mechanics Good posture and healthy  body mechanics can help to relieve stress in your body's tissues and joints. Body mechanics refers to the movements and positions of your body while you do your daily activities. Posture is part of body mechanics. Good posture means: Your spine is in its natural S-curve position (neutral). Your shoulders are pulled back slightly. Your head is not tipped forward (neutral). Follow these guidelines to improve your posture and body mechanics in your everyday activities. Standing  When standing, keep your spine neutral and your feet about hip-width apart. Keep a slight bend in your knees. Your ears, shoulders, and hips should line up. When you do a task in which you stand in one place for a long time, place one foot up on a stable object that is 2-4 inches (5-10 cm) high, such as a footstool. This helps keep your spine neutral. Sitting  When sitting, keep your spine neutral and keep your feet flat on the floor. Use a footrest, if necessary, and keep your thighs parallel to the floor. Avoid rounding your shoulders, and avoid tilting your head forward. When working at a desk or a computer, keep your desk at a height where your hands are slightly lower than your elbows. Slide your chair under your desk so you are close enough to maintain good posture. When working at a computer, place your monitor at a height where you are looking straight ahead and you do not have to tilt your head forward or downward to look at the screen. Resting When lying down and resting, avoid positions that are most painful for you. If you have pain with activities such as sitting, bending, stooping, or squatting, lie in a position in which your body does not bend very much. For example, avoid curling up on your side with your arms and knees near your chest (fetal position). If you have pain with activities such as standing for a long time or  reaching with your arms, lie with your spine in a neutral position and bend your knees slightly. Try the following positions: Lying on your side with a pillow between your knees. Lying on your back with a pillow under your knees. Lifting  When lifting objects, keep your feet at least shoulder-width apart and tighten your abdominal muscles. Bend your knees and hips and keep your spine neutral. It is important to lift using the strength of your legs, not your back. Do not lock your knees straight out. Always ask for help to lift heavy or awkward objects. This information is not intended to replace advice given to you by your health care provider. Make sure you discuss any questions you have with your health care provider. Document Revised: 08/13/2020 Document Reviewed: 08/13/2020 Elsevier Patient Education  2024 [employer].

## 2022-12-26 ENCOUNTER — Ambulatory Visit: Payer: Managed Care, Other (non HMO) | Admitting: Podiatry

## 2022-12-30 ENCOUNTER — Encounter: Payer: Self-pay | Admitting: Medical-Surgical

## 2022-12-30 ENCOUNTER — Ambulatory Visit: Payer: Managed Care, Other (non HMO) | Admitting: Medical-Surgical

## 2022-12-30 ENCOUNTER — Ambulatory Visit: Payer: Managed Care, Other (non HMO) | Admitting: Physical Medicine and Rehabilitation

## 2022-12-30 VITALS — BP 122/87 | HR 92 | Resp 20 | Ht 64.0 in | Wt 233.0 lb

## 2022-12-30 DIAGNOSIS — M797 Fibromyalgia: Secondary | ICD-10-CM

## 2022-12-30 DIAGNOSIS — Z9889 Other specified postprocedural states: Secondary | ICD-10-CM

## 2022-12-30 DIAGNOSIS — M79641 Pain in right hand: Secondary | ICD-10-CM

## 2022-12-30 DIAGNOSIS — F411 Generalized anxiety disorder: Secondary | ICD-10-CM

## 2022-12-30 DIAGNOSIS — M79632 Pain in left forearm: Secondary | ICD-10-CM | POA: Diagnosis not present

## 2022-12-30 DIAGNOSIS — R202 Paresthesia of skin: Secondary | ICD-10-CM

## 2022-12-30 DIAGNOSIS — M79604 Pain in right leg: Secondary | ICD-10-CM | POA: Diagnosis not present

## 2022-12-30 DIAGNOSIS — M79642 Pain in left hand: Secondary | ICD-10-CM

## 2022-12-30 DIAGNOSIS — M79605 Pain in left leg: Secondary | ICD-10-CM

## 2022-12-30 NOTE — Progress Notes (Signed)
        Established patient visit  History, exam, impression, and plan:  1. Pain in both lower extremities 2. Paresthesia of lower extremity 3. Fibromyalgia 4. Anxiety state Pleasant 57 year old female presenting today for follow up on chronic widespread arthralgias and mood. Since her last visit, she was able to get in with rheumatology where she had imaging of multiple joints done. She was told that her findings indicated osteoarthritis and there was no further intervention recommended. Continues to have issues with the pain despite starting Cymbalta 30mg  daily about 4 weeks ago. Has not noted any improvement in her symptoms or her mood. Taking the medication in the morning and endorses being fatigued throughout the day more than usual. Current anxiety level high as her mom is at the ED with heart problems and they are waiting to see Cardiology. Discussed options related to Cymbalta. Recommend switching to nighttime dosing to help with daytime fatigue. Her dose is currently small and there is plenty of room for adjustment. She would like to try nighttime dosing for a few weeks to see if that makes a difference. Advised her to reach out if she decides she would like to make dose changes.   Procedures performed this visit: None.  Return if symptoms worsen or fail to improve.  __________________________________ Thayer Ohm, DNP, APRN, FNP-BC Primary Care and Sports Medicine [facility] [place]

## 2022-12-30 NOTE — Progress Notes (Signed)
Office Visit Note  Patient: Stacy Woods             Date of Birth: 06/26/1965           MRN: [id number]             PCP: Christen Butter, NP Referring: Christen Butter, NP Visit Date: 01/13/2023 Occupation: @GUAROCC @  Subjective:  Pain in multiple joints  History of Present Illness: Stacy Woods is a 57 y.o. female with osteoarthritis and psoriasis.  Patient states she continues to have pain and discomfort in multiple joints.  She complains of discomfort in her bilateral hands, bilateral knee joints, SI joints and her feet.  She has been having discomfort in the left trochanteric bursa which is show radiates to her left lower extremity.  She has pain and discomfort in her lower back and SI joints.  She has not noticed any joint swelling.  She started [facility] in March 2024.  She has not noticed remarkable improvement in her psoriasis rash.  Patient states she went to see the podiatrist and did not get much help.  She was advised to orthotics for Planter fasciitis.  Patient states she does not have any symptoms of Planter fasciitis.    Activities of Daily Living:  Patient reports morning stiffness for 6-7 hours.   Patient Reports nocturnal pain.  Difficulty dressing/grooming: Denies Difficulty climbing stairs: Denies Difficulty getting out of chair: Denies Difficulty using hands for taps, buttons, cutlery, and/or writing: Reports  Review of Systems  Constitutional:  Positive for fatigue.  HENT:  Positive for mouth dryness. Negative for mouth sores.   Eyes:  Positive for dryness.  Respiratory:  Negative for shortness of breath.   Cardiovascular:  Negative for chest pain and palpitations.  Gastrointestinal:  Negative for blood in stool, constipation and diarrhea.  Endocrine: Negative for increased urination.  Genitourinary:  Negative for involuntary urination.  Musculoskeletal:  Positive for joint pain, gait problem, joint pain, joint swelling, myalgias, muscle weakness, morning stiffness,  muscle tenderness and myalgias.  Skin:  Positive for rash. Negative for color change, hair loss and sensitivity to sunlight.  Allergic/Immunologic: Positive for susceptible to infections.  Neurological:  Negative for dizziness and headaches.  Hematological:  Negative for swollen glands.  Psychiatric/Behavioral:  Negative for depressed mood and sleep disturbance. The patient is not nervous/anxious.     PMFS History:  Patient Active Problem List   Diagnosis Date Noted   Bilateral lower extremity edema 09/26/2021   Avulsion fracture of left wrist 09/24/2021   Chondromalacia patellae, left knee 11/23/2020   Osteoarthritis of left knee 11/23/2020   Cyst of lateral meniscus of left knee 11/23/2020   Morning joint stiffness 08/28/2019   Constipation 08/28/2019   Right shoulder pain 07/27/2019   Glaucoma 07/21/2019   Transaminitis 12/01/2017   MDD (recurrent major depressive disorder) in remission (HCC) 11/03/2017   AK (actinic keratosis) 07/14/2017   Low magnesium level 01/12/2017   B12 deficiency 01/01/2017   Trochanteric bursitis of left hip 10/08/2016   Malabsorption of iron 05/09/2016   Arthritis of knee, degenerative 04/14/2016   Surgical menopause 03/24/2016   Family history of breast cancer 03/24/2016   Primary osteoarthritis of both knees 10/01/2015   Hypoglycemia 06/12/2015   Cubital tunnel syndrome on right 09/14/2014   Degenerative disc disease, cervical 07/27/2014   Fibromyalgia 04/11/2013   Essential hypertension, benign 04/11/2013   Anxiety state 04/11/2013   Well adult exam 11/09/2012   Cellulitis 08/15/2012   Iron deficiency anemia  09/04/2011   Palpitations 09/04/2011   Fatigue 09/04/2011   History of gastric bypass 09/03/2011   Insomnia 08/16/2011   Osteoarthrosis, unspecified whether generalized or localized, lower leg 08/16/2011   Myalgia and myositis, unspecified 08/16/2011   Acquired absence of organ, genital organs 08/16/2011   Irritable bowel syndrome  08/16/2011   Seborrheic dermatitis 08/16/2011   Mixed hyperlipidemia 08/16/2011   Morbid obesity (HCC) 08/16/2011    Past Medical History:  Diagnosis Date   AK (actinic keratosis) 07/14/2017   Right upper chest wall   Anemia    Anemia, iron deficiency 05/02/2015   Fibromyalgia    History of gastric bypass 05/09/2016   Hypertension    Insomnia 08/14/2017   Malabsorption of iron 05/09/2016   PONV (postoperative nausea and vomiting)    Prediabetes 09/01/2013   March 2015    Seizure (HCC) 2018   from low blood sugar    Surgical menopause 03/24/2016    Family History  Problem Relation Age of Onset   Hypertension Mother    Breast cancer Mother    Diabetes Father    Hypertension Father    Stroke Father    Heart failure Father    CAD Father        has stents   Migraines Sister    Healthy Son    Healthy Daughter    Colon cancer Neg Hx    Colon polyps Neg Hx    Esophageal cancer Neg Hx    Stomach cancer Neg Hx    Rectal cancer Neg Hx    Past Surgical History:  Procedure Laterality Date   ABDOMINAL HYSTERECTOMY  06/10/1995   APPENDECTOMY     CESAREAN SECTION  [id number]   CHOLECYSTECTOMY     CHONDROPLASTY Left 11/23/2020   Procedure: CHONDROPLASTY MEDIAL FEMORAL PATELLA;  Surgeon: Jodi Geralds, MD;  Location: [facility];  Service: Orthopedics;  Laterality: Left;   GASTRIC BYPASS  06/09/2009   KNEE ARTHROSCOPY Left 06/09/2000   KNEE ARTHROSCOPY Left 10/2021   KNEE ARTHROSCOPY WITH EXCISION BAKER'S CYST Left 11/23/2020   Procedure: KNEE ARTHROSCOPY WITH ARTHROSCOPIC CYST EXCISION;  Surgeon: Jodi Geralds, MD;  Location: [facility];  Service: Orthopedics;  Laterality: Left;   KNEE ARTHROSCOPY WITH LATERAL RELEASE Left 11/23/2020   Procedure: ARTHROSCOPY KNEE WITH LATERAL RETINACULAR RELEASE, PARTIAL LATERAL MENISCECTOMY;  Surgeon: Jodi Geralds, MD;  Location: [facility];  Service: Orthopedics;  Laterality: Left;   TONSILLECTOMY   06/10/1975   ULNAR NERVE TRANSPOSITION Right 06/12/2015   Procedure: RIGHT CUBITAL TUNNEL RELEASE;  Surgeon: Mack Hook, MD;  Location: [facility];  Service: Orthopedics;  Laterality: Right;   Social History   Social History Narrative   Lives alone   Right-handed   Caffeine: <16 oz soda daily   Immunization History  Administered Date(s) Administered   Hepatitis B 10/20/2021   Hepb-cpg 10/10/2021   Influenza Whole 03/29/2013   Influenza,inj,Quad PF,6+ Mos 03/24/2016, 03/23/2017, 03/10/2018, 02/04/2019, 03/02/2020, 02/14/2021, 04/01/2022   Influenza-Unspecified 03/09/2010, 03/10/2015   PFIZER(Purple Top)SARS-COV-2 Vaccination 06/24/2019, 07/13/2019, 03/27/2020   Td 06/10/2006   Tdap 03/24/2016   Tetanus Immune Globulin 06/09/1998   Zoster Recombinant(Shingrix) 04/19/2018, 07/05/2018     Objective: Vital Signs: BP 115/84 (BP Location: Left Arm, Patient Position: Sitting, Cuff Size: Large)   Pulse 73   Resp 17   Ht 5\' 4"  (1.626 m)   Wt 232 lb 12.8 oz (105.6 kg)   BMI 39.96 kg/m    Physical Exam Vitals and  nursing note reviewed.  Constitutional:      Appearance: She is well-developed.  HENT:     Head: Normocephalic and atraumatic.  Eyes:     Conjunctiva/sclera: Conjunctivae normal.  Cardiovascular:     Rate and Rhythm: Normal rate and regular rhythm.     Heart sounds: Normal heart sounds.  Pulmonary:     Effort: Pulmonary effort is normal.     Breath sounds: Normal breath sounds.  Abdominal:     General: Bowel sounds are normal.     Palpations: Abdomen is soft.  Musculoskeletal:     Cervical back: Normal range of motion.  Lymphadenopathy:     Cervical: No cervical adenopathy.  Skin:    General: Skin is warm and dry.     Capillary Refill: Capillary refill takes less than 2 seconds.  Neurological:     Mental Status: She is alert and oriented to person, place, and time.  Psychiatric:        Behavior: Behavior normal.      Musculoskeletal  Exam: Cervical, thoracic and lumbar spine were in good range of motion.  She had discomfort with range of motion of her lumbar spine.  She had tenderness over SI joints.  Shoulder joints, elbow joints, wrist joints, MCPs PIPs and DIPs were in good range of motion with no synovitis or dactylitis.  She had tenderness over left trochanteric bursa.  Hip joints and knee joints were in good range of motion.  There was no tenderness over ankles or MTPs.  There was no Planter fasciitis or Achilles tendinitis.  She had bilateral pes cavus and hammertoes.  CDAI Exam: CDAI Score: -- Patient Global: --; Provider Global: -- Swollen: --; Tender: -- Joint Exam 01/13/2023   No joint exam has been documented for this visit   There is currently no information documented on the homunculus. Go to the Rheumatology activity and complete the homunculus joint exam.  Investigation: No additional findings.  Imaging: NCV with EMG (electromyography)  Result Date: 12/30/2022 Tyrell Antonio, MD     01/05/2023  7:14 AM EMG & NCV Findings: Evaluation of the left ulnar motor nerve showed decreased conduction velocity (A Elbow-B Elbow, 48 m/s).  All remaining nerves (as indicated in the following tables) were within normal limits.  All left vs. right side differences were within normal limits.  All examined muscles (as indicated in the following table) showed no evidence of electrical instability.  Impression: The above electrodiagnostic study is ABNORMAL and reveals evidence of a moderate left ulnar nerve entrapment at the elbow  affecting motor components.  Clinically this does not fit her current symptoms. There is no significant electrodiagnostic evidence of any other focal nerve entrapment, brachial plexopathy or cervical radiculopathy.  As you know, this particular electrodiagnostic study cannot rule out chemical radiculitis or sensory only radiculopathy.  **This electrodiagnostic study cannot rule out small fiber  polyneuropathy and dysesthesias from central pain syndromes such as stroke or central pain sensitization syndromes such as fibromyalgia.  Myotomal referral pain from trigger points is also not excluded. Recommendations: 1.  Follow-up with referring physician.  Could have her follow-up with orthopedic hand surgery consultation or neurology. 2.  Continue current management of symptoms. ___________________________ Naaman Plummer FAAPMR Board Certified, American Board of Physical Medicine and Rehabilitation Nerve Conduction Studies Anti Sensory Summary Table  Stim Site NR Peak (ms) Norm Peak (ms) P-T Amp (V) Norm P-T Amp Site1 Site2 Delta-P (ms) Dist (cm) Vel (m/s) Norm Vel (m/s) Left Median Acr Palm Anti  Sensory (2nd Digit)  31.2C Wrist    3.3 <3.6 36.5 >10 Wrist Palm 1.6 0.0   Palm    1.7 <2.0 31.8        Right Median Acr Palm Anti Sensory (2nd Digit)  30.4C Wrist    3.5 <3.6 26.7 >10 Wrist Palm 1.8 0.0   Palm    1.7 <2.0 21.9        Left Radial Anti Sensory (Base 1st Digit)  31C Wrist    2.0 <3.1 39.9  Wrist Base 1st Digit 2.0 0.0   Right Radial Anti Sensory (Base 1st Digit)  30.4C Wrist    1.9 <3.1 24.1  Wrist Base 1st Digit 1.9 0.0   Left Ulnar Anti Sensory (5th Digit)  31.5C Wrist    3.0 <3.7 31.5 >15.0 Wrist 5th Digit 3.0 14.0 47 >38 Right Ulnar Anti Sensory (5th Digit)  30.8C Wrist    2.9 <3.7 31.0 >15.0 Wrist 5th Digit 2.9 14.0 48 >38 Motor Summary Table  Stim Site NR Onset (ms) Norm Onset (ms) O-P Amp (mV) Norm O-P Amp Site1 Site2 Delta-0 (ms) Dist (cm) Vel (m/s) Norm Vel (m/s) Left Median Motor (Abd Poll Brev)  31.3C Wrist    3.4 <4.2 5.0 >5 Elbow Wrist 3.1 18.0 58 >50 Elbow    6.5  2.8        Right Median Motor (Abd Poll Brev)  30.6C Wrist    3.2 <4.2 6.7 >5 Elbow Wrist 3.4 17.0 50 >50 Elbow    6.6  3.4        Left Ulnar Motor (Abd Dig Min)  31.5C Wrist    2.5 <4.2 9.6 >3 B Elbow Wrist 2.7 16.5 61 >53 B Elbow    5.2  9.1  A Elbow B Elbow 2.1 10.0 *48 >53 A Elbow    7.3  7.6        Right Ulnar Motor  (Abd Dig Min)  30.8C Wrist    3.0 <4.2 11.1 >3 B Elbow Wrist 2.5 17.0 68 >53 B Elbow    5.5  10.9  A Elbow B Elbow 1.6 10.0 63 >53 A Elbow    7.1  10.3        EMG  Side Muscle Nerve Root Ins Act Fibs Psw Amp Dur Poly Recrt Int Dennie Bible Comment Left Abd Poll Brev Median C8-T1 Nml Nml Nml Nml Nml 0 Nml Nml  Left 1stDorInt Ulnar C8-T1 Nml Nml Nml Nml Nml 0 Nml Nml  Left PronatorTeres Median C6-7 Nml Nml Nml Nml Nml 0 Nml Nml  Left Biceps Musculocut C5-6 Nml Nml Nml Nml Nml 0 Nml Nml  Left Deltoid Axillary C5-6 Nml Nml Nml Nml Nml 0 Nml Nml  Nerve Conduction Studies Anti Sensory Left/Right Comparison  Stim Site L Lat (ms) R Lat (ms) L-R Lat (ms) L Amp (V) R Amp (V) L-R Amp (%) Site1 Site2 L Vel (m/s) R Vel (m/s) L-R Vel (m/s) Median Acr Palm Anti Sensory (2nd Digit)  31.2C Wrist 3.3 3.5 0.2 36.5 26.7 26.8 Wrist Palm    Palm 1.7 1.7 0.0 31.8 21.9 31.1      Radial Anti Sensory (Base 1st Digit)  31C Wrist 2.0 1.9 0.1 39.9 24.1 39.6 Wrist Base 1st Digit    Ulnar Anti Sensory (5th Digit)  31.5C Wrist 3.0 2.9 0.1 31.5 31.0 1.6 Wrist 5th Digit 47 48 1 Motor Left/Right Comparison  Stim Site L Lat (ms) R Lat (ms) L-R Lat (ms) L Amp (mV) R Amp (mV) L-R Amp (%)  Site1 Site2 L Vel (m/s) R Vel (m/s) L-R Vel (m/s) Median Motor (Abd Poll Brev)  31.3C Wrist 3.4 3.2 0.2 5.0 6.7 25.4 Elbow Wrist 58 50 8 Elbow 6.5 6.6 0.1 2.8 3.4 17.6      Ulnar Motor (Abd Dig Min)  31.5C Wrist 2.5 3.0 0.5 9.6 11.1 13.5 B Elbow Wrist 61 68 7 B Elbow 5.2 5.5 0.3 9.1 10.9 16.5 A Elbow B Elbow *48 63 15 A Elbow 7.3 7.1 0.2 7.6 10.3 26.2      Waveforms:             XR Lumbar Spine 2-3 Views  Result Date: 12/15/2022 No disc space narrowing was noted.  No syndesmophytes were noted.  Mild facet joint arthropathy was noted. Impression: These findings are suggestive of facet joint arthropathy of the lumbar spine.  XR Pelvis 1-2 Views  Result Date: 12/15/2022 No SI joint  sclerosis or narrowing was noted.  No hip joint narrowing was noted. Impression:  Unremarkable x-rays of the SI joints.  XR Foot 2 Views Left  Result Date: 12/15/2022 PIP and DIP narrowing was noted.  No MTP, intertarsal, tibiotalar or subtalar joint space narrowing was noted.  Inferior and posterior calcaneal spurs were noted.  No erosive changes were noted. Impression: These findings are suggestive of osteoarthritis of the foot.  XR Foot 2 Views Right  Result Date: 12/15/2022 PIP and DIP narrowing was noted.  No MTP, intertarsal, tibiotalar or subtalar joint space narrowing was noted.  Inferior and posterior calcaneal spurs were noted.  No erosive changes were noted. Impression: These findings are suggestive of osteoarthritis of the foot.  XR Hand 2 View Left  Result Date: 12/15/2022 CMC, PIP and DIP narrowing was noted.  No MCP, intercarpal or radiocarpal joint space narrowing was noted.  No erosive changes were noted. Impression: These findings are suggestive of osteoarthritis of the hand.  XR Hand 2 View Right  Result Date: 12/15/2022 CMC, PIP and DIP narrowing was noted.  No MCP, intercarpal or radiocarpal joint space narrowing was noted.  No erosive changes were noted. Impression: These findings are suggestive of osteoarthritis of the hand.   Recent Labs: Lab Results  Component Value Date   WBC 5.6 10/08/2022   HGB 14.3 10/08/2022   PLT 376 10/08/2022   NA 143 10/08/2022   K 4.5 10/08/2022   CL 110 10/08/2022   CO2 24 10/08/2022   GLUCOSE 90 10/08/2022   BUN 10 10/08/2022   CREATININE 1.04 (H) 10/08/2022   BILITOT 0.4 10/08/2022   ALKPHOS 105 07/24/2020   AST 31 10/08/2022   ALT 31 (H) 10/08/2022   PROT 6.6 10/08/2022   ALBUMIN 4.7 07/24/2020   CALCIUM 9.2 10/08/2022   GFRAA 78 06/20/2020   10/08/22: iron panel WNL, TSH 4.01, vitamin B12 231.  12/02/22: ANA negative, RF-, Anti-CCP-, HLA-B27-, ESR WNL, CRP WNL.   Speciality Comments: Otezla-stopped due to insurance [employer]-an inadequate response Tremfya March 2024  Procedures:  Large Joint Inj: L  greater trochanter on 01/13/2023 3:31 PM Indications: pain Details: 27 G 1.5 in needle, lateral approach  Arthrogram: No  Medications: 40 mg triamcinolone acetonide 40 MG/ML; 1.5 mL lidocaine 1 % Aspirate: 0 mL Outcome: tolerated well, no immediate complications Procedure, treatment alternatives, risks and benefits explained, specific risks discussed. Consent was given by the patient. Immediately prior to procedure a time out was called to verify the correct patient, procedure, equipment, support staff and site/side marked as required. Patient was prepped and draped in  the usual sterile fashion.     Allergies: Lyrica [pregabalin], Percocet [oxycodone-acetaminophen], Mobic [meloxicam], Naproxen, Nsaids, Prednisone, and Shellfish allergy   Assessment / Plan:     Visit Diagnoses: Psoriasis - Extensive psoriasis over bilateral shins for the last 4 years.  Otezla-stopped due to insurance coverage, Orencia-adequate response, Tremfya started March 24.  Patient has not seen much improvement in her psoriasis.  She has been on Tremfya for the last 4 months.  I advised patient to give it 6 months and if she does not have improvement she may discuss possible use of Taltz with her dermatologist.  Polyarthralgia-she can history of pain and discomfort in multiple joints.  There is no history of dactylitis or inflammatory arthritis.  She complains of SI joint discomfort and left trochanteric bursitis.  Primary osteoarthritis of both hands -she complains of pain and stiffness in her bilateral hands.  No synovitis or dactylitis was noted.  X-rays obtained at the last visit were reviewed which were suggestive of osteoarthritis.  I advised her to contact me if she develops any increased joint swelling.  Paresthesia of both hands -patient had nerve conduction velocity which showed ulnar neuropathy.  She has an appointment with the hand surgeon.  Avulsion fracture of left wrist-2023 - She did not require  surgery.  Trochanteric bursitis of left hip -she is been experiencing pain and discomfort in the left trochanteric region and difficulty walking due to pain.  After informed consent was obtained and different treatment options were discussed left trochanteric region was injected with lidocaine and Kenalog as described above.  Patient tolerated the procedure well.  A handout on IT band stretches was given.    Paresthesia of lower extremity - Most likely related to trochanteric bursitis.  Primary osteoarthritis of both knees-she complains of discomfort in her knee joints.  She has underlying osteoarthritis which causes discomfort.  No warmth swelling or effusion was noted.  Primary osteoarthritis of both feet - X-rays obtained at the last visit were suggestive of osteoarthritis.  X-rays were reviewed with the patient.  She had no dactylitis.  There is no Planter fasciitis or Achilles tendinitis.  Pes cavus of both feet -I advised her to get orthotics and metatarsal support.  Degenerative disc disease, cervical-she can use to have some stiffness in the cervical region.  Chronic midline low back pain without sciatica - X-rays obtained at the last visit showed facet joint arthropathy.  X-rays were reviewed with the patient.  Some of the stretching exercises were demonstrated in the office and a handout on back exercises was given.  Chronic SI joint pain - No SI joint sclerosis or narrowing was noted.  X-rays were reviewed with the patient today.  Fibromyalgia - dxd in 2003.  She has generalized pain, hyperalgesia and positive tender points.  She is on Cymbalta 30 mg p.o. daily and Flexeril 10 mg 3 times daily as needed.  Benefits of water aerobics and summing were discussed.  Other medical problems are listed as follows:  Mixed hyperlipidemia  Essential hypertension, benign-blood pressure is normal at 115/84.  History of gastric bypass-2001  History of IBS  Malabsorption of iron  AK  (actinic keratosis)  MDD (recurrent major depressive disorder) in remission [facility])  Family history of breast cancer-mother  Orders: Orders Placed This Encounter  Procedures   Large Joint Inj   No orders of the defined types were placed in this encounter.    Follow-Up Instructions: Return in about 6 months (around 07/16/2023) for Osteoarthritis, Ps.  Pollyann Savoy, MD  Note - This record has been created using Animal nutritionist.  Chart creation errors have been sought, but may not always  have been located. Such creation errors do not reflect on  the standard of medical care.

## 2022-12-30 NOTE — Progress Notes (Signed)
Functional Pain Scale - descriptive words and definitions  No Pain (0)   No Pain/Loss of function  Average Pain 0  Right handed. Bilateral hand numbness. Numbness can radiate into left forearm

## 2023-01-02 NOTE — Procedures (Unsigned)
EMG & NCV Findings: Evaluation of the left ulnar motor nerve showed decreased conduction velocity (A Elbow-B Elbow, 48 m/s).  All remaining nerves (as indicated in the following tables) were within normal limits.  All left vs. right side differences were within normal limits.    All examined muscles (as indicated in the following table) showed no evidence of electrical instability.    Impression: The above electrodiagnostic study is ABNORMAL and reveals evidence of a moderate left ulnar nerve entrapment at the elbow (cubital tunnel syndrome) affecting motor components. There is no significant electrodiagnostic evidence of any other focal nerve entrapment, brachial plexopathy or cervical radiculopathy.   Recommendations: 1.  Follow-up with referring physician. 2.  Continue current management of symptoms.  ___________________________ Stacy Woods FAAPMR Board Certified, American Board of Physical Medicine and Rehabilitation    Nerve Conduction Studies Anti Sensory Summary Table   Stim Site NR Peak (ms) Norm Peak (ms) P-T Amp (V) Norm P-T Amp Site1 Site2 Delta-P (ms) Dist (cm) Vel (m/s) Norm Vel (m/s)  Left Median Acr Palm Anti Sensory (2nd Digit)  31.2C  Wrist    3.3 <3.6 36.5 >10 Wrist Palm 1.6 0.0    Palm    1.7 <2.0 31.8         Right Median Acr Palm Anti Sensory (2nd Digit)  30.4C  Wrist    3.5 <3.6 26.7 >10 Wrist Palm 1.8 0.0    Palm    1.7 <2.0 21.9         Left Radial Anti Sensory (Base 1st Digit)  31C  Wrist    2.0 <3.1 39.9  Wrist Base 1st Digit 2.0 0.0    Right Radial Anti Sensory (Base 1st Digit)  30.4C  Wrist    1.9 <3.1 24.1  Wrist Base 1st Digit 1.9 0.0    Left Ulnar Anti Sensory (5th Digit)  31.5C  Wrist    3.0 <3.7 31.5 >15.0 Wrist 5th Digit 3.0 14.0 47 >38  Right Ulnar Anti Sensory (5th Digit)  30.8C  Wrist    2.9 <3.7 31.0 >15.0 Wrist 5th Digit 2.9 14.0 48 >38   Motor Summary Table   Stim Site NR Onset (ms) Norm Onset (ms) O-P Amp (mV) Norm O-P Amp Site1  Site2 Delta-0 (ms) Dist (cm) Vel (m/s) Norm Vel (m/s)  Left Median Motor (Abd Poll Brev)  31.3C  Wrist    3.4 <4.2 5.0 >5 Elbow Wrist 3.1 18.0 58 >50  Elbow    6.5  2.8         Right Median Motor (Abd Poll Brev)  30.6C  Wrist    3.2 <4.2 6.7 >5 Elbow Wrist 3.4 17.0 50 >50  Elbow    6.6  3.4         Left Ulnar Motor (Abd Dig Min)  31.5C  Wrist    2.5 <4.2 9.6 >3 B Elbow Wrist 2.7 16.5 61 >53  B Elbow    5.2  9.1  A Elbow B Elbow 2.1 10.0 *48 >53  A Elbow    7.3  7.6         Right Ulnar Motor (Abd Dig Min)  30.8C  Wrist    3.0 <4.2 11.1 >3 B Elbow Wrist 2.5 17.0 68 >53  B Elbow    5.5  10.9  A Elbow B Elbow 1.6 10.0 63 >53  A Elbow    7.1  10.3          EMG   Side Muscle Nerve Root  Ins Act Fibs Psw Amp Dur Poly Recrt Int Dennie Bible Comment  Left Abd Poll Brev Median C8-T1 Nml Nml Nml Nml Nml 0 Nml Nml   Left 1stDorInt Ulnar C8-T1 Nml Nml Nml Nml Nml 0 Nml Nml   Left PronatorTeres Median C6-7 Nml Nml Nml Nml Nml 0 Nml Nml   Left Biceps Musculocut C5-6 Nml Nml Nml Nml Nml 0 Nml Nml   Left Deltoid Axillary C5-6 Nml Nml Nml Nml Nml 0 Nml Nml     Nerve Conduction Studies Anti Sensory Left/Right Comparison   Stim Site L Lat (ms) R Lat (ms) L-R Lat (ms) L Amp (V) R Amp (V) L-R Amp (%) Site1 Site2 L Vel (m/s) R Vel (m/s) L-R Vel (m/s)  Median Acr Palm Anti Sensory (2nd Digit)  31.2C  Wrist 3.3 3.5 0.2 36.5 26.7 26.8 Wrist Palm     Palm 1.7 1.7 0.0 31.8 21.9 31.1       Radial Anti Sensory (Base 1st Digit)  31C  Wrist 2.0 1.9 0.1 39.9 24.1 39.6 Wrist Base 1st Digit     Ulnar Anti Sensory (5th Digit)  31.5C  Wrist 3.0 2.9 0.1 31.5 31.0 1.6 Wrist 5th Digit 47 48 1   Motor Left/Right Comparison   Stim Site L Lat (ms) R Lat (ms) L-R Lat (ms) L Amp (mV) R Amp (mV) L-R Amp (%) Site1 Site2 L Vel (m/s) R Vel (m/s) L-R Vel (m/s)  Median Motor (Abd Poll Brev)  31.3C  Wrist 3.4 3.2 0.2 5.0 6.7 25.4 Elbow Wrist 58 50 8  Elbow 6.5 6.6 0.1 2.8 3.4 17.6       Ulnar Motor (Abd Dig Min)  31.5C   Wrist 2.5 3.0 0.5 9.6 11.1 13.5 B Elbow Wrist 61 68 7  B Elbow 5.2 5.5 0.3 9.1 10.9 16.5 A Elbow B Elbow *48 63 15  A Elbow 7.3 7.1 0.2 7.6 10.3 26.2          Waveforms:

## 2023-01-04 ENCOUNTER — Encounter: Payer: Self-pay | Admitting: Medical-Surgical

## 2023-01-05 ENCOUNTER — Encounter: Payer: Self-pay | Admitting: Physical Medicine and Rehabilitation

## 2023-01-05 MED ORDER — DULOXETINE HCL 30 MG PO CPEP: 30.0000 mg | ORAL_CAPSULE | Freq: Every day | ORAL | 1 refills | Status: DC

## 2023-01-05 NOTE — Progress Notes (Signed)
Stacy Woods - 57 y.o. female MRN [id number]  Date of birth: [birth year]  Office Visit Note: Visit Date: 12/30/2022 PCP: Christen Butter, NP Referred by: Pollyann Savoy, MD  Subjective: Chief Complaint  Patient presents with   Right Hand - Numbness   Left Hand - Numbness   HPI: Stacy Woods is a 57 y.o. female who comes in today for evaluation and management at the request of Dr. Pollyann Savoy for chronic and worsening and severe bilateral hand pain and numbness.  She is right-hand dominant.  She does report symptoms on the left up into the forearm at times.  She denies any frank radicular type pain down the arms although that can happen at times.  She does endorse neck pain but has had a history of chronic neck and back pain and multiple joint pain.  She has a history of fibromyalgia.  She began seeing Dr. Corliss Skains because she has a history of psoriasis and she was starting to have more complaints of swelling and hand pain and joint pain.  She does have a history of prior right ulnar nerve decompression or transposition by Dr. Janee Morn who is a Hydrographic surveyor at [facility] orthopedics.  This was done I believe in 2018.  I do not see an associated nerve conduction study with that.  She did have a prior nerve conduction study and EMG by Dr. Callie Fielding in 2016 which was essentially normal at the time.  This was of both upper extremities.  She also had a prior electrodiagnostic study of the lower extremities by Dr. Marjory Lies that did show bilateral meralgia paresthetica.  I have actually seen the patient in the office for occipital neuralgia through Dr. Lucia Gaskins the neurologist that was seeing her for her chronic headaches.  We have completed cervical medial branch blocks and ablation.  She was also seen by Dr. Marlyne Beards for left hand avulsion fracture in 2023.   I spent more than 30 minutes speaking face-to-face with the patient with 50% of the time in counseling and discussing coordination of  care.      Review of Systems  Musculoskeletal:  Positive for back pain, joint pain and neck pain.  Neurological:  Positive for tingling and headaches.  All other systems reviewed and are negative.  Otherwise per HPI.  Assessment & Plan: Visit Diagnoses:    ICD-10-CM   1. Paresthesia of skin  R20.2 NCV with EMG (electromyography)    2. Bilateral hand pain  M79.641    M79.642     3. Left forearm pain  M79.632     4. H/O decompression of ulnar nerve  Z98.890     5. Fibromyalgia  M79.7        Plan: Impression: Complicated picture with history of chronic orthopedic complaints and hand and arm complaints status post right ulnar nerve transposition and history of fibromyalgia.  Symptoms seem to be consistent more with a carpal tunnel syndrome versus dysesthesia from fibromyalgia.  Prior remote electrodiagnostic study was normal in the upper extremities and there may be an electrodiagnostic study that were not able to see.  I did repeat electrodiagnostic study today.  The above electrodiagnostic study is ABNORMAL and reveals evidence of a moderate left ulnar nerve entrapment at the elbow  affecting motor components.  Clinically this does not fit her current symptoms.  There is no significant electrodiagnostic evidence of any other focal nerve entrapment, brachial plexopathy or cervical radiculopathy.  As you know, this particular electrodiagnostic study cannot  rule out chemical radiculitis or sensory only radiculopathy.   **This electrodiagnostic study cannot rule out small fiber polyneuropathy and dysesthesias from central pain syndromes such as stroke or central pain sensitization syndromes such as fibromyalgia.  Myotomal referral pain from trigger points is also not excluded.  Recommendations: 1.  Follow-up with referring physician.  Could have her follow-up with orthopedic hand surgery consultation or neurology. 2.  Continue current management of symptoms.  Meds & Orders: No orders  of the defined types were placed in this encounter.   Orders Placed This Encounter  Procedures   NCV with EMG (electromyography)    Follow-up: Return Melvenia Needles, MD.   Procedures: No procedures performed  EMG & NCV Findings: Evaluation of the left ulnar motor nerve showed decreased conduction velocity (A Elbow-B Elbow, 48 m/s).  All remaining nerves (as indicated in the following tables) were within normal limits.  All left vs. right side differences were within normal limits.    All examined muscles (as indicated in the following table) showed no evidence of electrical instability.    Impression: The above electrodiagnostic study is ABNORMAL and reveals evidence of a moderate left ulnar nerve entrapment at the elbow  affecting motor components.  Clinically this does not fit her current symptoms.  There is no significant electrodiagnostic evidence of any other focal nerve entrapment, brachial plexopathy or cervical radiculopathy.  As you know, this particular electrodiagnostic study cannot rule out chemical radiculitis or sensory only radiculopathy.   **This electrodiagnostic study cannot rule out small fiber polyneuropathy and dysesthesias from central pain syndromes such as stroke or central pain sensitization syndromes such as fibromyalgia.  Myotomal referral pain from trigger points is also not excluded.  Recommendations: 1.  Follow-up with referring physician.  Could have her follow-up with orthopedic hand surgery consultation or neurology. 2.  Continue current management of symptoms.  ___________________________ Naaman Plummer FAAPMR Board Certified, American Board of Physical Medicine and Rehabilitation    Nerve Conduction Studies Anti Sensory Summary Table   Stim Site NR Peak (ms) Norm Peak (ms) P-T Amp (V) Norm P-T Amp Site1 Site2 Delta-P (ms) Dist (cm) Vel (m/s) Norm Vel (m/s)  Left Median Acr Palm Anti Sensory (2nd Digit)  31.2C  Wrist    3.3 <3.6 36.5 >10 Wrist  Palm 1.6 0.0    Palm    1.7 <2.0 31.8         Right Median Acr Palm Anti Sensory (2nd Digit)  30.4C  Wrist    3.5 <3.6 26.7 >10 Wrist Palm 1.8 0.0    Palm    1.7 <2.0 21.9         Left Radial Anti Sensory (Base 1st Digit)  31C  Wrist    2.0 <3.1 39.9  Wrist Base 1st Digit 2.0 0.0    Right Radial Anti Sensory (Base 1st Digit)  30.4C  Wrist    1.9 <3.1 24.1  Wrist Base 1st Digit 1.9 0.0    Left Ulnar Anti Sensory (5th Digit)  31.5C  Wrist    3.0 <3.7 31.5 >15.0 Wrist 5th Digit 3.0 14.0 47 >38  Right Ulnar Anti Sensory (5th Digit)  30.8C  Wrist    2.9 <3.7 31.0 >15.0 Wrist 5th Digit 2.9 14.0 48 >38   Motor Summary Table   Stim Site NR Onset (ms) Norm Onset (ms) O-P Amp (mV) Norm O-P Amp Site1 Site2 Delta-0 (ms) Dist (cm) Vel (m/s) Norm Vel (m/s)  Left Median Motor (Abd Poll Brev)  31.3C  Wrist  3.4 <4.2 5.0 >5 Elbow Wrist 3.1 18.0 58 >50  Elbow    6.5  2.8         Right Median Motor (Abd Poll Brev)  30.6C  Wrist    3.2 <4.2 6.7 >5 Elbow Wrist 3.4 17.0 50 >50  Elbow    6.6  3.4         Left Ulnar Motor (Abd Dig Min)  31.5C  Wrist    2.5 <4.2 9.6 >3 B Elbow Wrist 2.7 16.5 61 >53  B Elbow    5.2  9.1  A Elbow B Elbow 2.1 10.0 *48 >53  A Elbow    7.3  7.6         Right Ulnar Motor (Abd Dig Min)  30.8C  Wrist    3.0 <4.2 11.1 >3 B Elbow Wrist 2.5 17.0 68 >53  B Elbow    5.5  10.9  A Elbow B Elbow 1.6 10.0 63 >53  A Elbow    7.1  10.3          EMG   Side Muscle Nerve Root Ins Act Fibs Psw Amp Dur Poly Recrt Int Dennie Bible Comment  Left Abd Poll Brev Median C8-T1 Nml Nml Nml Nml Nml 0 Nml Nml   Left 1stDorInt Ulnar C8-T1 Nml Nml Nml Nml Nml 0 Nml Nml   Left PronatorTeres Median C6-7 Nml Nml Nml Nml Nml 0 Nml Nml   Left Biceps Musculocut C5-6 Nml Nml Nml Nml Nml 0 Nml Nml   Left Deltoid Axillary C5-6 Nml Nml Nml Nml Nml 0 Nml Nml     Nerve Conduction Studies Anti Sensory Left/Right Comparison   Stim Site L Lat (ms) R Lat (ms) L-R Lat (ms) L Amp (V) R Amp (V) L-R Amp (%) Site1  Site2 L Vel (m/s) R Vel (m/s) L-R Vel (m/s)  Median Acr Palm Anti Sensory (2nd Digit)  31.2C  Wrist 3.3 3.5 0.2 36.5 26.7 26.8 Wrist Palm     Palm 1.7 1.7 0.0 31.8 21.9 31.1       Radial Anti Sensory (Base 1st Digit)  31C  Wrist 2.0 1.9 0.1 39.9 24.1 39.6 Wrist Base 1st Digit     Ulnar Anti Sensory (5th Digit)  31.5C  Wrist 3.0 2.9 0.1 31.5 31.0 1.6 Wrist 5th Digit 47 48 1   Motor Left/Right Comparison   Stim Site L Lat (ms) R Lat (ms) L-R Lat (ms) L Amp (mV) R Amp (mV) L-R Amp (%) Site1 Site2 L Vel (m/s) R Vel (m/s) L-R Vel (m/s)  Median Motor (Abd Poll Brev)  31.3C  Wrist 3.4 3.2 0.2 5.0 6.7 25.4 Elbow Wrist 58 50 8  Elbow 6.5 6.6 0.1 2.8 3.4 17.6       Ulnar Motor (Abd Dig Min)  31.5C  Wrist 2.5 3.0 0.5 9.6 11.1 13.5 B Elbow Wrist 61 68 7  B Elbow 5.2 5.5 0.3 9.1 10.9 16.5 A Elbow B Elbow *48 63 15  A Elbow 7.3 7.1 0.2 7.6 10.3 26.2          Waveforms:                      Clinical History: No specialty comments available.   She reports that she has never smoked. She has never been exposed to tobacco smoke. She has never used smokeless tobacco.  Recent Labs    04/01/22 0000 10/08/22 0841  HGBA1C 5.5 5.9*    Objective:  VS:  HT:    WT:   BMI:     BP:   HR: bpm  TEMP: ( )  RESP:  Physical Exam Vitals and nursing note reviewed.  Constitutional:      General: She is not in acute distress.    Appearance: Normal appearance. She is well-developed. She is not ill-appearing.  HENT:     Head: Normocephalic and atraumatic.  Eyes:     Conjunctiva/sclera: Conjunctivae normal.     Pupils: Pupils are equal, round, and reactive to light.  Cardiovascular:     Rate and Rhythm: Normal rate.     Pulses: Normal pulses.  Pulmonary:     Effort: Pulmonary effort is normal.  Musculoskeletal:        General: Tenderness present. No swelling or deformity.     Right lower leg: No edema.     Left lower leg: No edema.     Comments: Inspection reveals well-healed  surgical scar of right ulnar nerve decompression but no atrophy of the bilateral APB or FDI or hand intrinsics. There is no swelling, color changes, allodynia or dystrophic changes. There is 5 out of 5 strength in the bilateral wrist extension, finger abduction and long finger flexion. There is intact sensation to light touch in all dermatomal and peripheral nerve distributions. There is a negative Froment's test bilaterally. There is a negative Tinel's test at the bilateral wrist and elbow. There is a negative Phalen's test bilaterally. There is a negative Hoffmann's test bilaterally.  Skin:    General: Skin is warm and dry.     Findings: No erythema or rash.  Neurological:     General: No focal deficit present.     Mental Status: She is alert and oriented to person, place, and time.     Cranial Nerves: No cranial nerve deficit.     Sensory: Sensory deficit present.     Motor: No weakness or abnormal muscle tone.     Coordination: Coordination normal.     Gait: Gait normal.  Psychiatric:        Mood and Affect: Mood normal.        Behavior: Behavior normal.     Ortho Exam  Imaging: No results found.  Past Medical/Family/Surgical/Social History: Medications & Allergies reviewed per EMR, new medications updated. Patient Active Problem List   Diagnosis Date Noted   Bilateral lower extremity edema 09/26/2021   Avulsion fracture of left wrist 09/24/2021   Chondromalacia patellae, left knee 11/23/2020   Osteoarthritis of left knee 11/23/2020   Cyst of lateral meniscus of left knee 11/23/2020   Morning joint stiffness 08/28/2019   Constipation 08/28/2019   Right shoulder pain 07/27/2019   Glaucoma 07/21/2019   Transaminitis 12/01/2017   MDD (recurrent major depressive disorder) in remission (HCC) 11/03/2017   AK (actinic keratosis) 07/14/2017   Low magnesium level 01/12/2017   B12 deficiency 01/01/2017   Trochanteric bursitis of left hip 10/08/2016   Malabsorption of iron  05/09/2016   Arthritis of knee, degenerative 04/14/2016   Surgical menopause 03/24/2016   Family history of breast cancer 03/24/2016   Primary osteoarthritis of both knees 10/01/2015   Hypoglycemia 06/12/2015   Cubital tunnel syndrome on right 09/14/2014   Degenerative disc disease, cervical 07/27/2014   Fibromyalgia 04/11/2013   Essential hypertension, benign 04/11/2013   Anxiety state 04/11/2013   Well adult exam 11/09/2012   Cellulitis 08/15/2012   Iron deficiency anemia 09/04/2011   Palpitations 09/04/2011   Fatigue 09/04/2011   History of gastric  bypass 09/03/2011   Insomnia 08/16/2011   Osteoarthrosis, unspecified whether generalized or localized, lower leg 08/16/2011   Myalgia and myositis, unspecified 08/16/2011   Acquired absence of organ, genital organs 08/16/2011   Irritable bowel syndrome 08/16/2011   Seborrheic dermatitis 08/16/2011   Mixed hyperlipidemia 08/16/2011   Morbid obesity (HCC) 08/16/2011   Past Medical History:  Diagnosis Date   AK (actinic keratosis) 07/14/2017   Right upper chest wall   Anemia    Anemia, iron deficiency 05/02/2015   Fibromyalgia    History of gastric bypass 05/09/2016   Hypertension    Insomnia 08/14/2017   Malabsorption of iron 05/09/2016   PONV (postoperative nausea and vomiting)    Prediabetes 09/01/2013   March 2015    Seizure (HCC) 2018   from low blood sugar    Surgical menopause 03/24/2016   Family History  Problem Relation Age of Onset   Hypertension Mother    Breast cancer Mother    Diabetes Father    Hypertension Father    Stroke Father    Heart failure Father    CAD Father        has stents   Migraines Sister    Healthy Son    Healthy Daughter    Colon cancer Neg Hx    Colon polyps Neg Hx    Esophageal cancer Neg Hx    Stomach cancer Neg Hx    Rectal cancer Neg Hx    Past Surgical History:  Procedure Laterality Date   ABDOMINAL HYSTERECTOMY  06/10/1995   APPENDECTOMY     CESAREAN SECTION   [id number]   CHOLECYSTECTOMY     CHONDROPLASTY Left 11/23/2020   Procedure: CHONDROPLASTY MEDIAL FEMORAL PATELLA;  Surgeon: Jodi Geralds, MD;  Location: [facility];  Service: Orthopedics;  Laterality: Left;   GASTRIC BYPASS  06/09/2009   KNEE ARTHROSCOPY Left 06/09/2000   KNEE ARTHROSCOPY Left 10/2021   KNEE ARTHROSCOPY WITH EXCISION BAKER'S CYST Left 11/23/2020   Procedure: KNEE ARTHROSCOPY WITH ARTHROSCOPIC CYST EXCISION;  Surgeon: Jodi Geralds, MD;  Location: [facility];  Service: Orthopedics;  Laterality: Left;   KNEE ARTHROSCOPY WITH LATERAL RELEASE Left 11/23/2020   Procedure: ARTHROSCOPY KNEE WITH LATERAL RETINACULAR RELEASE, PARTIAL LATERAL MENISCECTOMY;  Surgeon: Jodi Geralds, MD;  Location: [facility];  Service: Orthopedics;  Laterality: Left;   TONSILLECTOMY  06/10/1975   ULNAR NERVE TRANSPOSITION Right 06/12/2015   Procedure: RIGHT CUBITAL TUNNEL RELEASE;  Surgeon: Mack Hook, MD;  Location: [facility];  Service: Orthopedics;  Laterality: Right;   Social History   Occupational History   Occupation: [employer]  Tobacco Use   Smoking status: Never    Passive exposure: Never   Smokeless tobacco: Never  Vaping Use   Vaping status: Never Used  Substance and Sexual Activity   Alcohol use: No   Drug use: No   Sexual activity: Not Currently    Birth control/protection: Surgical    Comment: Hysterectomy

## 2023-01-06 ENCOUNTER — Telehealth: Payer: Self-pay | Admitting: *Deleted

## 2023-01-06 DIAGNOSIS — G5622 Lesion of ulnar nerve, left upper limb: Secondary | ICD-10-CM

## 2023-01-06 NOTE — Telephone Encounter (Signed)
Referral placed.

## 2023-01-06 NOTE — Telephone Encounter (Signed)
-----   Message from [facility] sent at 01/05/2023  9:23 PM EDT ----- Nerve conduction tested by Dr. Alvester Morin is consistent with ulnar nerve neuropathy.  I recommend referral to hand surgery.  If patient is in agreement please refer her to hand surgeon of her choice.  [facility]. Thank you, Pollyann Savoy, MD ----- Message ----- From: Tyrell Antonio, MD Sent: 01/05/2023   7:14 AM EDT To: Pollyann Savoy, MD; Christen Butter, NP

## 2023-01-08 ENCOUNTER — Ambulatory Visit: Payer: Managed Care, Other (non HMO) | Admitting: Podiatry

## 2023-01-08 ENCOUNTER — Encounter: Payer: Self-pay | Admitting: Podiatry

## 2023-01-08 DIAGNOSIS — M2041 Other hammer toe(s) (acquired), right foot: Secondary | ICD-10-CM

## 2023-01-08 DIAGNOSIS — M797 Fibromyalgia: Secondary | ICD-10-CM | POA: Diagnosis not present

## 2023-01-08 DIAGNOSIS — M722 Plantar fascial fibromatosis: Secondary | ICD-10-CM | POA: Diagnosis not present

## 2023-01-08 DIAGNOSIS — M7662 Achilles tendinitis, left leg: Secondary | ICD-10-CM

## 2023-01-08 DIAGNOSIS — M7661 Achilles tendinitis, right leg: Secondary | ICD-10-CM

## 2023-01-08 DIAGNOSIS — M2042 Other hammer toe(s) (acquired), left foot: Secondary | ICD-10-CM

## 2023-01-08 NOTE — Progress Notes (Signed)
  Subjective:  Patient ID: Stacy Woods, female    DOB: [birth year],   MRN: [id number]  Chief Complaint  Patient presents with   Foot Pain    57 y.o. female presents for concern of bilateral foot pain. Relates pain in the back of the heel and the bottom of the heel as well as some pain in the ball of the foot. Was also told she has hammertoes. Relates she was being worked up for psoriatic arthritis and just has osteoarthritis in the foot. Relates overall she manages the pain as she has been dealing with fibromyalgia and contributes a lot of issues to this . Denies any other pedal complaints. Denies n/v/f/c.   Past Medical History:  Diagnosis Date   AK (actinic keratosis) 07/14/2017   Right upper chest wall   Anemia    Anemia, iron deficiency 05/02/2015   Fibromyalgia    History of gastric bypass 05/09/2016   Hypertension    Insomnia 08/14/2017   Malabsorption of iron 05/09/2016   PONV (postoperative nausea and vomiting)    Prediabetes 09/01/2013   March 2015    Seizure (HCC) 2018   from low blood sugar    Surgical menopause 03/24/2016    Objective:  Physical Exam: Vascular: DP/PT pulses 2/4 bilateral. CFT <3 seconds. Normal hair growth on digits. No edema.  Skin. No lacerations or abrasions bilateral feet.  Musculoskeletal: MMT 5/5 bilateral lower extremities in DF, PF, Inversion and Eversion. Deceased ROM in DF of ankle joint. Mild tenderness to medial calcaneal tubercle bilateral and some pain along achilles insertion bilateral. Hammered digits 2-5. No pain to ball of foot today.  Neurological: Sensation intact to light touch.   Assessment:   1. Plantar fasciitis, bilateral   2. Achilles tendonitis, bilateral   3. Hammertoe, bilateral   4. Fibromyalgia      Plan:  Patient was evaluated and treated and all questions answered. Discussed plantar fasciitis , achilles tendonitsi, hammertoes and fibromyalgia with patient.  X-rays reviewed and discussed with patient. No  acute fractures or dislocations noted. Mild spurring noted at inferior calcaneus. Hammered digits 2-5 bilateral  Discussed treatment options including, ice, NSAIDS, supportive shoes, bracing, and stretching. Stretching exercises provided to be done on a daily basis.   Will hold off on any anti-inflammatories  Discussed considered CMO and patient will look into. Wil get shceduled for fitting if she decides.   Follow-up as needed    Louann Sjogren, DPM

## 2023-01-08 NOTE — Patient Instructions (Signed)

## 2023-01-13 ENCOUNTER — Encounter: Payer: Self-pay | Admitting: Rheumatology

## 2023-01-13 ENCOUNTER — Ambulatory Visit: Payer: Managed Care, Other (non HMO) | Attending: Rheumatology | Admitting: Rheumatology

## 2023-01-13 VITALS — BP 115/84 | HR 73 | Resp 17 | Ht 64.0 in | Wt 232.8 lb

## 2023-01-13 DIAGNOSIS — R202 Paresthesia of skin: Secondary | ICD-10-CM

## 2023-01-13 DIAGNOSIS — M797 Fibromyalgia: Secondary | ICD-10-CM

## 2023-01-13 DIAGNOSIS — M545 Low back pain, unspecified: Secondary | ICD-10-CM

## 2023-01-13 DIAGNOSIS — Q6671 Congenital pes cavus, right foot: Secondary | ICD-10-CM

## 2023-01-13 DIAGNOSIS — M7062 Trochanteric bursitis, left hip: Secondary | ICD-10-CM

## 2023-01-13 DIAGNOSIS — M2550 Pain in unspecified joint: Secondary | ICD-10-CM | POA: Diagnosis not present

## 2023-01-13 DIAGNOSIS — M19042 Primary osteoarthritis, left hand: Secondary | ICD-10-CM

## 2023-01-13 DIAGNOSIS — M19041 Primary osteoarthritis, right hand: Secondary | ICD-10-CM

## 2023-01-13 DIAGNOSIS — L409 Psoriasis, unspecified: Secondary | ICD-10-CM | POA: Diagnosis not present

## 2023-01-13 DIAGNOSIS — L570 Actinic keratosis: Secondary | ICD-10-CM

## 2023-01-13 DIAGNOSIS — Q6672 Congenital pes cavus, left foot: Secondary | ICD-10-CM

## 2023-01-13 DIAGNOSIS — M5030 Other cervical disc degeneration, unspecified cervical region: Secondary | ICD-10-CM

## 2023-01-13 DIAGNOSIS — E782 Mixed hyperlipidemia: Secondary | ICD-10-CM

## 2023-01-13 DIAGNOSIS — K909 Intestinal malabsorption, unspecified: Secondary | ICD-10-CM

## 2023-01-13 DIAGNOSIS — I10 Essential (primary) hypertension: Secondary | ICD-10-CM

## 2023-01-13 DIAGNOSIS — F3340 Major depressive disorder, recurrent, in remission, unspecified: Secondary | ICD-10-CM

## 2023-01-13 DIAGNOSIS — Z803 Family history of malignant neoplasm of breast: Secondary | ICD-10-CM

## 2023-01-13 DIAGNOSIS — G8929 Other chronic pain: Secondary | ICD-10-CM

## 2023-01-13 DIAGNOSIS — M19072 Primary osteoarthritis, left ankle and foot: Secondary | ICD-10-CM

## 2023-01-13 DIAGNOSIS — S62102A Fracture of unspecified carpal bone, left wrist, initial encounter for closed fracture: Secondary | ICD-10-CM

## 2023-01-13 DIAGNOSIS — Z9884 Bariatric surgery status: Secondary | ICD-10-CM

## 2023-01-13 DIAGNOSIS — M19071 Primary osteoarthritis, right ankle and foot: Secondary | ICD-10-CM

## 2023-01-13 DIAGNOSIS — Z8719 Personal history of other diseases of the digestive system: Secondary | ICD-10-CM

## 2023-01-13 DIAGNOSIS — M170 Bilateral primary osteoarthritis of knee: Secondary | ICD-10-CM

## 2023-01-13 DIAGNOSIS — M533 Sacrococcygeal disorders, not elsewhere classified: Secondary | ICD-10-CM

## 2023-01-13 MED ORDER — TRIAMCINOLONE ACETONIDE 40 MG/ML IJ SUSP
40.0000 mg | INTRAMUSCULAR | Status: AC | PRN
Start: 2023-01-13 — End: 2023-01-13
  Administered 2023-01-13: 40 mg via INTRA_ARTICULAR

## 2023-01-13 MED ORDER — LIDOCAINE HCL 1 % IJ SOLN
1.5000 mL | INTRAMUSCULAR | Status: AC | PRN
  Administered 2023-01-13: 1.5 mL

## 2023-01-13 NOTE — Patient Instructions (Signed)
Low Back Sprain or Strain Rehab Ask your health care provider which exercises are safe for you. Do exercises exactly as told by your health care provider and adjust them as directed. It is normal to feel mild stretching, pulling, tightness, or discomfort as you do these exercises. Stop right away if you feel sudden pain or your pain gets worse. Do not begin these exercises until told by your health care provider. Stretching and range-of-motion exercises These exercises warm up your muscles and joints and improve the movement and flexibility of your back. These exercises also help to relieve pain, numbness, and tingling. Lumbar rotation  Lie on your back on a firm bed or the floor with your knees bent. Straighten your arms out to your sides so each arm forms a 90-degree angle (right angle) with a side of your body. Slowly move (rotate) both of your knees to one side of your body until you feel a stretch in your lower back (lumbar). Try not to let your shoulders lift off the floor. Hold this position for __________ seconds. Tense your abdominal muscles and slowly move your knees back to the starting position. Repeat this exercise on the other side of your body. Repeat __________ times. Complete this exercise __________ times a day. Single knee to chest  Lie on your back on a firm bed or the floor with both legs straight. Bend one of your knees. Use your hands to move your knee up toward your chest until you feel a gentle stretch in your lower back and buttock. Hold your leg in this position by holding on to the front of your knee. Keep your other leg as straight as possible. Hold this position for __________ seconds. Slowly return to the starting position. Repeat with your other leg. Repeat __________ times. Complete this exercise __________ times a day. Prone extension on elbows  Lie on your abdomen on a firm bed or the floor (prone position). Prop yourself up on your elbows. Use your arms  to help lift your chest up until you feel a gentle stretch in your abdomen and your lower back. This will place some of your body weight on your elbows. If this is uncomfortable, try stacking pillows under your chest. Your hips should stay down, against the surface that you are lying on. Keep your hip and back muscles relaxed. Hold this position for __________ seconds. Slowly relax your upper body and return to the starting position. Repeat __________ times. Complete this exercise __________ times a day. Strengthening exercises These exercises build strength and endurance in your back. Endurance is the ability to use your muscles for a long time, even after they get tired. Pelvic tilt This exercise strengthens the muscles that lie deep in the abdomen. Lie on your back on a firm bed or the floor with your legs extended. Bend your knees so they are pointing toward the ceiling and your feet are flat on the floor. Tighten your lower abdominal muscles to press your lower back against the floor. This motion will tilt your pelvis so your tailbone points up toward the ceiling instead of pointing to your feet or the floor. To help with this exercise, you may place a small towel under your lower back and try to push your back into the towel. Hold this position for __________ seconds. Let your muscles relax completely before you repeat this exercise. Repeat __________ times. Complete this exercise __________ times a day. Alternating arm and leg raises  Get on your hands  and knees on a firm surface. If you are on a hard floor, you may want to use padding, such as an exercise mat, to cushion your knees. Line up your arms and legs. Your hands should be directly below your shoulders, and your knees should be directly below your hips. Lift your left leg behind you. At the same time, raise your right arm and straighten it in front of you. Do not lift your leg higher than your hip. Do not lift your arm higher  than your shoulder. Keep your abdominal and back muscles tight. Keep your hips facing the ground. Do not arch your back. Keep your balance carefully, and do not hold your breath. Hold this position for __________ seconds. Slowly return to the starting position. Repeat with your right leg and your left arm. Repeat __________ times. Complete this exercise __________ times a day. Abdominal set with straight leg raise  Lie on your back on a firm bed or the floor. Bend one of your knees and keep your other leg straight. Tense your abdominal muscles and lift your straight leg up, 4-6 inches (10-15 cm) off the ground. Keep your abdominal muscles tight and hold this position for __________ seconds. Do not hold your breath. Do not arch your back. Keep it flat against the ground. Keep your abdominal muscles tense as you slowly lower your leg back to the starting position. Repeat with your other leg. Repeat __________ times. Complete this exercise __________ times a day. Single leg lower with bent knees Lie on your back on a firm bed or the floor. Tense your abdominal muscles and lift your feet off the floor, one foot at a time, so your knees and hips are bent in 90-degree angles (right angles). Your knees should be over your hips and your lower legs should be parallel to the floor. Keeping your abdominal muscles tense and your knee bent, slowly lower one of your legs so your toe touches the ground. Lift your leg back up to return to the starting position. Do not hold your breath. Do not let your back arch. Keep your back flat against the ground. Repeat with your other leg. Repeat __________ times. Complete this exercise __________ times a day. Posture and body mechanics Good posture and healthy body mechanics can help to relieve stress in your body's tissues and joints. Body mechanics refers to the movements and positions of your body while you do your daily activities. Posture is part of body  mechanics. Good posture means: Your spine is in its natural S-curve position (neutral). Your shoulders are pulled back slightly. Your head is not tipped forward (neutral). Follow these guidelines to improve your posture and body mechanics in your everyday activities. Standing  When standing, keep your spine neutral and your feet about hip-width apart. Keep a slight bend in your knees. Your ears, shoulders, and hips should line up. When you do a task in which you stand in one place for a long time, place one foot up on a stable object that is 2-4 inches (5-10 cm) high, such as a footstool. This helps keep your spine neutral. Sitting  When sitting, keep your spine neutral and keep your feet flat on the floor. Use a footrest, if necessary, and keep your thighs parallel to the floor. Avoid rounding your shoulders, and avoid tilting your head forward. When working at a desk or a computer, keep your desk at a height where your hands are slightly lower than your elbows. Slide your  chair under your desk so you are close enough to maintain good posture. When working at a computer, place your monitor at a height where you are looking straight ahead and you do not have to tilt your head forward or downward to look at the screen. Resting When lying down and resting, avoid positions that are most painful for you. If you have pain with activities such as sitting, bending, stooping, or squatting, lie in a position in which your body does not bend very much. For example, avoid curling up on your side with your arms and knees near your chest (fetal position). If you have pain with activities such as standing for a long time or reaching with your arms, lie with your spine in a neutral position and bend your knees slightly. Try the following positions: Lying on your side with a pillow between your knees. Lying on your back with a pillow under your knees. Lifting  When lifting objects, keep your feet at least  shoulder-width apart and tighten your abdominal muscles. Bend your knees and hips and keep your spine neutral. It is important to lift using the strength of your legs, not your back. Do not lock your knees straight out. Always ask for help to lift heavy or awkward objects. This information is not intended to replace advice given to you by your health care provider. Make sure you discuss any questions you have with your health care provider. Document Revised: 09/29/2022 Document Reviewed: 08/13/2020 Elsevier Patient Education  2024 [employer]   Iliotibial Band Syndrome Rehab Ask your health care provider which exercises are safe for you. Do exercises exactly as told by your provider and adjust them as told. It's normal to feel mild stretching, pulling, tightness, or discomfort as you do these exercises. Stop right away if you feel sudden pain or your pain gets a lot worse. Do not begin these exercises until told by your provider. Stretching and range-of-motion exercises These exercises warm up your muscles and joints. They also improve the movement and flexibility of your hip and pelvis. Quadriceps stretch, prone  Lie face down (prone) on a firm surface like a bed or padded floor. Bend your left / right knee. Reach back to hold your ankle or pant leg. If you can't reach your ankle or pant leg, use a belt looped around your foot and grab the belt instead. Gently pull your heel toward your butt. Your knee should not slide out to the side. You should feel a stretch in the front of your thigh and knee, also called the quadriceps. Hold this position for __________ seconds. Repeat __________ times. Complete this exercise __________ times a day. Iliotibial band stretch The iliotibial band is a strip of tissue that runs along the outside of your hip down to your knee. Lie on your side with your left / right leg on top. Bend both knees and grab your left / right ankle. Stretch out your bottom arm to  help you balance. Slowly bring your top knee back so your thigh goes behind your back. Slowly lower your top leg toward the floor until you feel a gentle stretch on the outside of your left / right hip and thigh. If you don't feel a stretch and your knee won't go farther, place the heel of your other foot on top of your knee and pull your knee down toward the floor with your foot. Hold this position for __________ seconds. Repeat __________ times. Complete this exercise __________ times  a day. Strengthening exercises These exercises build strength and endurance in your hip and pelvis. Endurance means your muscles can keep working even when they're tired. Straight leg raises, side-lying This exercise strengthens the muscles that rotate the leg at the hip and move it away from your body. These muscles are called hip abductors. Lie on your side with your left / right leg on top. Lie so your head, shoulder, hip, and knee line up. You can bend your bottom knee to help you balance. Roll your hips slightly forward so they're stacked directly over each other. Your left / right knee should face forward. Tense the muscles in your outer thigh and hip. Lift your top leg 4-6 inches (10-15 cm) off the ground. Hold this position for __________ seconds. Slowly lower your leg back down to the starting position. Let your muscles fully relax before doing this exercise again. Repeat __________ times. Complete this exercise __________ times a day. Leg raises, prone This exercise strengthens the muscles that move the hips backward. These muscles are called hip extensors. Lie face down (prone) on your bed or a firm surface. You can put a pillow under your hips for comfort and to support your lower back. Bend your left / right knee so your foot points straight up toward the ceiling. Keep the other leg straight and behind you. Squeeze your butt muscles. Lift your left / right thigh off the firm surface. Do not let your  back arch. Tense your thigh muscle as hard as you can without having more knee pain. Hold this position for __________ seconds. Slowly lower your leg to the starting position. Allow your leg to relax all the way. Repeat __________ times. Complete this exercise __________ times a day. Hip hike  Stand sideways on a bottom step. Place your feet so that your left / right leg is on the step, and the other foot is hanging off the side. If you need support for balance, hold onto a railing or wall. Keep your knees straight and your abdomen square, meaning your hips are level. Then, lift your left / right hip up toward the ceiling. Slowly let your leg that's hanging off the step lower towards the floor. Your foot should get closer to the ground. Do not lean or bend your knees during this movement. Repeat __________ times. Complete this exercise __________ times a day. This information is not intended to replace advice given to you by your health care provider. Make sure you discuss any questions you have with your health care provider. Document Revised: 08/08/2022 Document Reviewed: 08/08/2022 Elsevier Patient Education  2024 [employer].

## 2023-01-14 ENCOUNTER — Encounter: Payer: Self-pay | Admitting: Medical-Surgical

## 2023-01-15 MED ORDER — DULOXETINE HCL 60 MG PO CPEP: 60.0000 mg | ORAL_CAPSULE | Freq: Every day | ORAL | 1 refills | Status: DC

## 2023-01-21 ENCOUNTER — Encounter: Payer: Self-pay | Admitting: Medical-Surgical

## 2023-01-28 ENCOUNTER — Ambulatory Visit: Payer: Managed Care, Other (non HMO) | Admitting: Orthopedic Surgery

## 2023-01-28 ENCOUNTER — Other Ambulatory Visit (INDEPENDENT_AMBULATORY_CARE_PROVIDER_SITE_OTHER): Payer: Managed Care, Other (non HMO)

## 2023-01-28 DIAGNOSIS — M25522 Pain in left elbow: Secondary | ICD-10-CM

## 2023-01-28 DIAGNOSIS — G5622 Lesion of ulnar nerve, left upper limb: Secondary | ICD-10-CM

## 2023-01-28 NOTE — Progress Notes (Signed)
RYANA LAUTZ - 57 y.o. female MRN [id number]  Date of birth: 1966-01-28  Office Visit Note: Visit Date: 01/28/2023 PCP: Christen Butter, NP Referred by: Pollyann Savoy, MD  Subjective: Chief Complaint  Patient presents with   Left Elbow - Pain   HPI: CHARLES SHARER is a pleasant 57 y.o. female who presents today for evaluation of ongoing numbness and tingling in the ulnar sided digits of her left hand.  This been present for approximately 1 year, worsening in nature.  She has undergone prior electrodiagnostic study which does confirm left-sided cubital tunnel syndrome in July of this year.  She has a history of prior right cubital tunnel release done in 2017 without need for anterior transposition.  Visit Reason: elbow pain Hand dominance: right Occupation: works on a computer all day  Diabetic: No Heart/Lung History: n/a  Blood Thinners: n/a  Prior Testing/EMG: yes  Injections (Date):n/a Treatments: Prior Surgery: n/a  Pertinent ROS were reviewed with the patient and found to be negative unless otherwise specified above in HPI.   Assessment & Plan: Visit Diagnoses:  1. Pain in left elbow     Plan: Extensive discussion was had the patient today regarding her ongoing left-sided cubital tunnel syndrome.  She has both clinical and electrodiagnostic evidence to confirm this diagnosis.  Given her symptoms refractory to conservative care in the form of activity modification, she is indicated for left cubital tunnel release.  Risks and benefits of the procedure were discussed, risks including but not limited to infection, bleeding, scarring, stiffness, nerve injury, vascular injury, need for nerve transposition, recurrence of symptoms and need for subsequent operation.  Patient expressed understanding.   We will move forward with surgical scheduling of left cubital tunnel release under general anesthesia at the next available date.    Follow-up: Return for 2 weeks postop.    Meds & Orders: No orders of the defined types were placed in this encounter.   Orders Placed This Encounter  Procedures   XR Elbow Complete Left (3+View)     Procedures: No procedures performed      Clinical History: No specialty comments available.  She reports that she has never smoked. She has never been exposed to tobacco smoke. She has never used smokeless tobacco.  Recent Labs    04/01/22 0000 10/08/22 0841  HGBA1C 5.5 5.9*    Objective:   Vital Signs: There were no vitals taken for this visit.  Physical Exam  Gen: Well-appearing, in no acute distress; non-toxic CV: Regular Rate. Well-perfused. Warm.  Resp: Breathing unlabored on room air; no wheezing. Psych: Fluid speech in conversation; appropriate affect; normal thought process Neuro: Sensation intact throughout. No gross coordination deficits.   Ortho Exam PHYSICAL EXAM:  General: Patient is well appearing and in no distress. Cervical spine mobility is full in all directions:  Skin and Muscle: Prior right elbow cubital tunnel release, well-healed.  No other skin changes are apparent to upper extremities.   Range of Motion and Palpation Tests: Mobility is full about the elbows with flexion and extension.  No evidence of nerve subluxation at left elbow.  Forearm supination and pronation are 85/85 bilaterally.  Wrist flexion/extension is 75/65 bilaterally.  Digital flexion and extension are full.  Thumb opposition is full to the base of the small fingers bilaterally.    No cords or nodules are palpated.  No triggering is observed.     Neurologic, Vascular, Motor: Sensation is diminished to light touch in the ulnar nerve  distribution left side, intact to the median/radial distributions.    Tinel's testing positive left cubital tunnel, positive cubital tunnel compression left side Small finger FDP left 4/5, negative Froment's, negative Wartenberg Fingers pink and well perfused.  Capillary refill is brisk.      Lab Results  Component Value Date   HGBA1C 5.9 (H) 10/08/2022     Imaging: XR Elbow Complete Left (3+View)  Result Date: 01/28/2023 Multiple views of the left elbow were completed today X-rays demonstrate well located radiocapitellar and ulnohumeral joint on the AP without significant degenerative changes.  Lateral demonstrates appropriate positioning of the elbow joint, no significant bony abnormalities.   Past Medical/Family/Surgical/Social History: Medications & Allergies reviewed per EMR, new medications updated. Patient Active Problem List   Diagnosis Date Noted   Bilateral lower extremity edema 09/26/2021   Avulsion fracture of left wrist 09/24/2021   Chondromalacia patellae, left knee 11/23/2020   Osteoarthritis of left knee 11/23/2020   Cyst of lateral meniscus of left knee 11/23/2020   Morning joint stiffness 08/28/2019   Constipation 08/28/2019   Right shoulder pain 07/27/2019   Glaucoma 07/21/2019   Transaminitis 12/01/2017   MDD (recurrent major depressive disorder) in remission (HCC) 11/03/2017   AK (actinic keratosis) 07/14/2017   Low magnesium level 01/12/2017   B12 deficiency 01/01/2017   Trochanteric bursitis of left hip 10/08/2016   Malabsorption of iron 05/09/2016   Arthritis of knee, degenerative 04/14/2016   Surgical menopause 03/24/2016   Family history of breast cancer 03/24/2016   Primary osteoarthritis of both knees 10/01/2015   Hypoglycemia 06/12/2015   Cubital tunnel syndrome on right 09/14/2014   Degenerative disc disease, cervical 07/27/2014   Fibromyalgia 04/11/2013   Essential hypertension, benign 04/11/2013   Anxiety state 04/11/2013   Well adult exam 11/09/2012   Cellulitis 08/15/2012   Iron deficiency anemia 09/04/2011   Palpitations 09/04/2011   Fatigue 09/04/2011   History of gastric bypass 09/03/2011   Insomnia 08/16/2011   Osteoarthrosis, unspecified whether generalized or localized, lower leg 08/16/2011   Myalgia and  myositis, unspecified 08/16/2011   Acquired absence of organ, genital organs 08/16/2011   Irritable bowel syndrome 08/16/2011   Seborrheic dermatitis 08/16/2011   Mixed hyperlipidemia 08/16/2011   Morbid obesity (HCC) 08/16/2011   Past Medical History:  Diagnosis Date   AK (actinic keratosis) 07/14/2017   Right upper chest wall   Anemia    Anemia, iron deficiency 05/02/2015   Fibromyalgia    History of gastric bypass 05/09/2016   Hypertension    Insomnia 08/14/2017   Malabsorption of iron 05/09/2016   PONV (postoperative nausea and vomiting)    Prediabetes 09/01/2013   March 2015    Seizure (HCC) 2018   from low blood sugar    Surgical menopause 03/24/2016   Family History  Problem Relation Age of Onset   Hypertension Mother    Breast cancer Mother    Diabetes Father    Hypertension Father    Stroke Father    Heart failure Father    CAD Father        has stents   Migraines Sister    Healthy Son    Healthy Daughter    Colon cancer Neg Hx    Colon polyps Neg Hx    Esophageal cancer Neg Hx    Stomach cancer Neg Hx    Rectal cancer Neg Hx    Past Surgical History:  Procedure Laterality Date   ABDOMINAL HYSTERECTOMY  06/10/1995   APPENDECTOMY  CESAREAN SECTION  [id number]   CHOLECYSTECTOMY     CHONDROPLASTY Left 11/23/2020   Procedure: CHONDROPLASTY MEDIAL FEMORAL PATELLA;  Surgeon: Jodi Geralds, MD;  Location: [facility];  Service: Orthopedics;  Laterality: Left;   GASTRIC BYPASS  06/09/2009   KNEE ARTHROSCOPY Left 06/09/2000   KNEE ARTHROSCOPY Left 10/2021   KNEE ARTHROSCOPY WITH EXCISION BAKER'S CYST Left 11/23/2020   Procedure: KNEE ARTHROSCOPY WITH ARTHROSCOPIC CYST EXCISION;  Surgeon: Jodi Geralds, MD;  Location: [facility];  Service: Orthopedics;  Laterality: Left;   KNEE ARTHROSCOPY WITH LATERAL RELEASE Left 11/23/2020   Procedure: ARTHROSCOPY KNEE WITH LATERAL RETINACULAR RELEASE, PARTIAL LATERAL MENISCECTOMY;  Surgeon:  Jodi Geralds, MD;  Location: [facility];  Service: Orthopedics;  Laterality: Left;   TONSILLECTOMY  06/10/1975   ULNAR NERVE TRANSPOSITION Right 06/12/2015   Procedure: RIGHT CUBITAL TUNNEL RELEASE;  Surgeon: Mack Hook, MD;  Location: [facility];  Service: Orthopedics;  Laterality: Right;   Social History   Occupational History   Occupation: [employer]  Tobacco Use   Smoking status: Never    Passive exposure: Never   Smokeless tobacco: Never  Vaping Use   Vaping status: Never Used  Substance and Sexual Activity   Alcohol use: No   Drug use: No   Sexual activity: Not Currently    Birth control/protection: Surgical    Comment: Hysterectomy    Trindon Dorton Fara Boros) Denese Killings, M.D.  [facility] 8:41 AM

## 2023-01-28 NOTE — H&P (View-Only) (Signed)
 Stacy Woods - 57 y.o. female MRN [id number]  Date of birth: 1966-01-28  Office Visit Note: Visit Date: 01/28/2023 PCP: Christen Butter, NP Referred by: Pollyann Savoy, MD  Subjective: Chief Complaint  Patient presents with   Left Elbow - Pain   HPI: Stacy Woods is a pleasant 57 y.o. female who presents today for evaluation of ongoing numbness and tingling in the ulnar sided digits of her left hand.  This been present for approximately 1 year, worsening in nature.  She has undergone prior electrodiagnostic study which does confirm left-sided cubital tunnel syndrome in July of this year.  She has a history of prior right cubital tunnel release done in 2017 without need for anterior transposition.  Visit Reason: elbow pain Hand dominance: right Occupation: works on a computer all day  Diabetic: No Heart/Lung History: n/a  Blood Thinners: n/a  Prior Testing/EMG: yes  Injections (Date):n/a Treatments: Prior Surgery: n/a  Pertinent ROS were reviewed with the patient and found to be negative unless otherwise specified above in HPI.   Assessment & Plan: Visit Diagnoses:  1. Pain in left elbow     Plan: Extensive discussion was had the patient today regarding her ongoing left-sided cubital tunnel syndrome.  She has both clinical and electrodiagnostic evidence to confirm this diagnosis.  Given her symptoms refractory to conservative care in the form of activity modification, she is indicated for left cubital tunnel release.  Risks and benefits of the procedure were discussed, risks including but not limited to infection, bleeding, scarring, stiffness, nerve injury, vascular injury, need for nerve transposition, recurrence of symptoms and need for subsequent operation.  Patient expressed understanding.   We will move forward with surgical scheduling of left cubital tunnel release under general anesthesia at the next available date.    Follow-up: Return for 2 weeks postop.    Meds & Orders: No orders of the defined types were placed in this encounter.   Orders Placed This Encounter  Procedures   XR Elbow Complete Left (3+View)     Procedures: No procedures performed      Clinical History: No specialty comments available.  She reports that she has never smoked. She has never been exposed to tobacco smoke. She has never used smokeless tobacco.  Recent Labs    04/01/22 0000 10/08/22 0841  HGBA1C 5.5 5.9*    Objective:   Vital Signs: There were no vitals taken for this visit.  Physical Exam  Gen: Well-appearing, in no acute distress; non-toxic CV: Regular Rate. Well-perfused. Warm.  Resp: Breathing unlabored on room air; no wheezing. Psych: Fluid speech in conversation; appropriate affect; normal thought process Neuro: Sensation intact throughout. No gross coordination deficits.   Ortho Exam PHYSICAL EXAM:  General: Patient is well appearing and in no distress. Cervical spine mobility is full in all directions:  Skin and Muscle: Prior right elbow cubital tunnel release, well-healed.  No other skin changes are apparent to upper extremities.   Range of Motion and Palpation Tests: Mobility is full about the elbows with flexion and extension.  No evidence of nerve subluxation at left elbow.  Forearm supination and pronation are 85/85 bilaterally.  Wrist flexion/extension is 75/65 bilaterally.  Digital flexion and extension are full.  Thumb opposition is full to the base of the small fingers bilaterally.    No cords or nodules are palpated.  No triggering is observed.     Neurologic, Vascular, Motor: Sensation is diminished to light touch in the ulnar nerve  distribution left side, intact to the median/radial distributions.    Tinel's testing positive left cubital tunnel, positive cubital tunnel compression left side Small finger FDP left 4/5, negative Froment's, negative Wartenberg Fingers pink and well perfused.  Capillary refill is brisk.      Lab Results  Component Value Date   HGBA1C 5.9 (H) 10/08/2022     Imaging: XR Elbow Complete Left (3+View)  Result Date: 01/28/2023 Multiple views of the left elbow were completed today X-rays demonstrate well located radiocapitellar and ulnohumeral joint on the AP without significant degenerative changes.  Lateral demonstrates appropriate positioning of the elbow joint, no significant bony abnormalities.   Past Medical/Family/Surgical/Social History: Medications & Allergies reviewed per EMR, new medications updated. Patient Active Problem List   Diagnosis Date Noted   Bilateral lower extremity edema 09/26/2021   Avulsion fracture of left wrist 09/24/2021   Chondromalacia patellae, left knee 11/23/2020   Osteoarthritis of left knee 11/23/2020   Cyst of lateral meniscus of left knee 11/23/2020   Morning joint stiffness 08/28/2019   Constipation 08/28/2019   Right shoulder pain 07/27/2019   Glaucoma 07/21/2019   Transaminitis 12/01/2017   MDD (recurrent major depressive disorder) in remission (HCC) 11/03/2017   AK (actinic keratosis) 07/14/2017   Low magnesium level 01/12/2017   B12 deficiency 01/01/2017   Trochanteric bursitis of left hip 10/08/2016   Malabsorption of iron 05/09/2016   Arthritis of knee, degenerative 04/14/2016   Surgical menopause 03/24/2016   Family history of breast cancer 03/24/2016   Primary osteoarthritis of both knees 10/01/2015   Hypoglycemia 06/12/2015   Cubital tunnel syndrome on right 09/14/2014   Degenerative disc disease, cervical 07/27/2014   Fibromyalgia 04/11/2013   Essential hypertension, benign 04/11/2013   Anxiety state 04/11/2013   Well adult exam 11/09/2012   Cellulitis 08/15/2012   Iron deficiency anemia 09/04/2011   Palpitations 09/04/2011   Fatigue 09/04/2011   History of gastric bypass 09/03/2011   Insomnia 08/16/2011   Osteoarthrosis, unspecified whether generalized or localized, lower leg 08/16/2011   Myalgia and  myositis, unspecified 08/16/2011   Acquired absence of organ, genital organs 08/16/2011   Irritable bowel syndrome 08/16/2011   Seborrheic dermatitis 08/16/2011   Mixed hyperlipidemia 08/16/2011   Morbid obesity (HCC) 08/16/2011   Past Medical History:  Diagnosis Date   AK (actinic keratosis) 07/14/2017   Right upper chest wall   Anemia    Anemia, iron deficiency 05/02/2015   Fibromyalgia    History of gastric bypass 05/09/2016   Hypertension    Insomnia 08/14/2017   Malabsorption of iron 05/09/2016   PONV (postoperative nausea and vomiting)    Prediabetes 09/01/2013   March 2015    Seizure (HCC) 2018   from low blood sugar    Surgical menopause 03/24/2016   Family History  Problem Relation Age of Onset   Hypertension Mother    Breast cancer Mother    Diabetes Father    Hypertension Father    Stroke Father    Heart failure Father    CAD Father        has stents   Migraines Sister    Healthy Son    Healthy Daughter    Colon cancer Neg Hx    Colon polyps Neg Hx    Esophageal cancer Neg Hx    Stomach cancer Neg Hx    Rectal cancer Neg Hx    Past Surgical History:  Procedure Laterality Date   ABDOMINAL HYSTERECTOMY  06/10/1995   APPENDECTOMY  CESAREAN SECTION  [id number]   CHOLECYSTECTOMY     CHONDROPLASTY Left 11/23/2020   Procedure: CHONDROPLASTY MEDIAL FEMORAL PATELLA;  Surgeon: Jodi Geralds, MD;  Location: [facility];  Service: Orthopedics;  Laterality: Left;   GASTRIC BYPASS  06/09/2009   KNEE ARTHROSCOPY Left 06/09/2000   KNEE ARTHROSCOPY Left 10/2021   KNEE ARTHROSCOPY WITH EXCISION BAKER'S CYST Left 11/23/2020   Procedure: KNEE ARTHROSCOPY WITH ARTHROSCOPIC CYST EXCISION;  Surgeon: Jodi Geralds, MD;  Location: [facility];  Service: Orthopedics;  Laterality: Left;   KNEE ARTHROSCOPY WITH LATERAL RELEASE Left 11/23/2020   Procedure: ARTHROSCOPY KNEE WITH LATERAL RETINACULAR RELEASE, PARTIAL LATERAL MENISCECTOMY;  Surgeon:  Jodi Geralds, MD;  Location: [facility];  Service: Orthopedics;  Laterality: Left;   TONSILLECTOMY  06/10/1975   ULNAR NERVE TRANSPOSITION Right 06/12/2015   Procedure: RIGHT CUBITAL TUNNEL RELEASE;  Surgeon: Mack Hook, MD;  Location: [facility];  Service: Orthopedics;  Laterality: Right;   Social History   Occupational History   Occupation: [employer]  Tobacco Use   Smoking status: Never    Passive exposure: Never   Smokeless tobacco: Never  Vaping Use   Vaping status: Never Used  Substance and Sexual Activity   Alcohol use: No   Drug use: No   Sexual activity: Not Currently    Birth control/protection: Surgical    Comment: Hysterectomy    Trindon Dorton Fara Boros) Denese Killings, M.D.  [facility] 8:41 AM

## 2023-01-29 ENCOUNTER — Encounter (HOSPITAL_BASED_OUTPATIENT_CLINIC_OR_DEPARTMENT_OTHER): Payer: Self-pay | Admitting: Orthopedic Surgery

## 2023-02-06 ENCOUNTER — Ambulatory Visit (HOSPITAL_BASED_OUTPATIENT_CLINIC_OR_DEPARTMENT_OTHER)
Admission: RE | Admit: 2023-02-06 | Discharge: 2023-02-06 | Disposition: A | Discharge status: Home / Self Care | Payer: Managed Care, Other (non HMO) | Attending: Orthopedic Surgery | Admitting: Orthopedic Surgery

## 2023-02-06 ENCOUNTER — Ambulatory Visit (HOSPITAL_BASED_OUTPATIENT_CLINIC_OR_DEPARTMENT_OTHER): Payer: Managed Care, Other (non HMO) | Admitting: Certified Registered"

## 2023-02-06 ENCOUNTER — Encounter (HOSPITAL_BASED_OUTPATIENT_CLINIC_OR_DEPARTMENT_OTHER): Payer: Self-pay | Admitting: Orthopedic Surgery

## 2023-02-06 ENCOUNTER — Encounter (HOSPITAL_BASED_OUTPATIENT_CLINIC_OR_DEPARTMENT_OTHER)
Admission: RE | Disposition: A | Discharge status: Home / Self Care | Payer: Self-pay | Source: Home / Self Care | Attending: Orthopedic Surgery

## 2023-02-06 DIAGNOSIS — G5622 Lesion of ulnar nerve, left upper limb: Secondary | ICD-10-CM | POA: Diagnosis present

## 2023-02-06 DIAGNOSIS — Z01818 Encounter for other preprocedural examination: Secondary | ICD-10-CM

## 2023-02-06 HISTORY — PX: ULNAR TUNNEL RELEASE: SHX820

## 2023-02-06 SURGERY — RELEASE, CUBITAL TUNNEL
Anesthesia: General | Site: Elbow | Laterality: Left

## 2023-02-06 MED ORDER — MIDAZOLAM HCL 2 MG/2ML IJ SOLN
INTRAMUSCULAR | Status: AC
  Filled 2023-02-06: qty 2

## 2023-02-06 MED ORDER — ACETAMINOPHEN 500 MG PO TABS
ORAL_TABLET | ORAL | Status: AC
  Filled 2023-02-06: qty 2

## 2023-02-06 MED ORDER — DEXAMETHASONE SODIUM PHOSPHATE 10 MG/ML IJ SOLN
INTRAMUSCULAR | Status: AC
  Filled 2023-02-06: qty 1

## 2023-02-06 MED ORDER — LIDOCAINE HCL (CARDIAC) PF 100 MG/5ML IV SOSY
PREFILLED_SYRINGE | INTRAVENOUS | Status: DC | PRN
  Administered 2023-02-06: 20 mg via INTRAVENOUS

## 2023-02-06 MED ORDER — IBUPROFEN 200 MG PO TABS
ORAL_TABLET | ORAL | Status: AC
  Filled 2023-02-06: qty 4

## 2023-02-06 MED ORDER — PROPOFOL 10 MG/ML IV BOLUS
INTRAVENOUS | Status: DC | PRN
  Administered 2023-02-06: 200 mg via INTRAVENOUS

## 2023-02-06 MED ORDER — ONDANSETRON HCL 4 MG/2ML IJ SOLN
INTRAMUSCULAR | Status: AC
  Filled 2023-02-06: qty 2

## 2023-02-06 MED ORDER — BUPIVACAINE HCL 0.25 % IJ SOLN
INTRAMUSCULAR | Status: DC | PRN
Start: 2023-02-06 — End: 2023-02-06
  Administered 2023-02-06: 16 mL

## 2023-02-06 MED ORDER — FENTANYL CITRATE (PF) 100 MCG/2ML IJ SOLN
INTRAMUSCULAR | Status: AC
  Filled 2023-02-06: qty 2

## 2023-02-06 MED ORDER — PROPOFOL 10 MG/ML IV BOLUS
INTRAVENOUS | Status: AC
  Filled 2023-02-06: qty 20

## 2023-02-06 MED ORDER — LACTATED RINGERS IV SOLN: INTRAVENOUS | Status: DC

## 2023-02-06 MED ORDER — SUCCINYLCHOLINE CHLORIDE 200 MG/10ML IV SOSY
PREFILLED_SYRINGE | INTRAVENOUS | Status: AC
  Filled 2023-02-06: qty 10

## 2023-02-06 MED ORDER — LIDOCAINE 2% (20 MG/ML) 5 ML SYRINGE
INTRAMUSCULAR | Status: AC
  Filled 2023-02-06: qty 5

## 2023-02-06 MED ORDER — CEFAZOLIN SODIUM-DEXTROSE 2-4 GM/100ML-% IV SOLN
INTRAVENOUS | Status: AC
  Filled 2023-02-06: qty 100

## 2023-02-06 MED ORDER — ONDANSETRON HCL 4 MG/2ML IJ SOLN
INTRAMUSCULAR | Status: DC | PRN
  Administered 2023-02-06: 4 mg via INTRAVENOUS

## 2023-02-06 MED ORDER — 0.9 % SODIUM CHLORIDE (POUR BTL) OPTIME
TOPICAL | Status: DC | PRN
  Administered 2023-02-06: 400 mL

## 2023-02-06 MED ORDER — DEXAMETHASONE SODIUM PHOSPHATE 10 MG/ML IJ SOLN
INTRAMUSCULAR | Status: DC | PRN
  Administered 2023-02-06: 10 mg via INTRAVENOUS

## 2023-02-06 MED ORDER — ACETAMINOPHEN 500 MG PO TABS: 1000.0000 mg | ORAL_TABLET | Freq: Once | ORAL | Status: DC

## 2023-02-06 MED ORDER — CEFAZOLIN SODIUM-DEXTROSE 2-4 GM/100ML-% IV SOLN
2.0000 g | INTRAVENOUS | Status: AC
  Administered 2023-02-06: 2 g via INTRAVENOUS

## 2023-02-06 MED ORDER — FENTANYL CITRATE (PF) 100 MCG/2ML IJ SOLN
INTRAMUSCULAR | Status: DC | PRN
  Administered 2023-02-06 (×2): 50 ug via INTRAVENOUS

## 2023-02-06 MED ORDER — FENTANYL CITRATE (PF) 100 MCG/2ML IJ SOLN
25.0000 ug | INTRAMUSCULAR | Status: DC | PRN
  Administered 2023-02-06: 25 ug via INTRAVENOUS

## 2023-02-06 MED ORDER — IBUPROFEN 600 MG PO TABS
800.0000 mg | ORAL_TABLET | Freq: Once | ORAL | Status: AC
  Administered 2023-02-06: 800 mg via ORAL

## 2023-02-06 MED ORDER — AMISULPRIDE (ANTIEMETIC) 5 MG/2ML IV SOLN: 10.0000 mg | Freq: Once | INTRAVENOUS | Status: DC | PRN

## 2023-02-06 MED ORDER — MIDAZOLAM HCL 5 MG/5ML IJ SOLN
INTRAMUSCULAR | Status: DC | PRN
  Administered 2023-02-06: 2 mg via INTRAVENOUS

## 2023-02-06 SURGICAL SUPPLY — 46 items
ADH SKN CLS APL DERMABOND .7 (GAUZE/BANDAGES/DRESSINGS)
APL PRP 5X4 STRL LF DISP 70% (MISCELLANEOUS) ×1
APPLICATOR CHLORAPREP 3ML ORNG (MISCELLANEOUS) ×1 IMPLANT
BLADE MINI RND TIP GREEN BEAV (BLADE) ×1 IMPLANT
BLADE SURG 15 STRL LF DISP TIS (BLADE) ×2 IMPLANT
BLADE SURG 15 STRL SS (BLADE) ×2
BNDG CMPR 5X3 KNIT ELC UNQ LF (GAUZE/BANDAGES/DRESSINGS)
BNDG CMPR 5X4 CHSV STRCH STRL (GAUZE/BANDAGES/DRESSINGS) ×1
BNDG CMPR 5X4 KNIT ELC UNQ LF (GAUZE/BANDAGES/DRESSINGS) ×2
BNDG CMPR 75X21 PLY HI ABS (MISCELLANEOUS) ×1
BNDG CMPR 9X4 STRL LF SNTH (GAUZE/BANDAGES/DRESSINGS) ×1
BNDG COHESIVE 4X5 TAN STRL LF (GAUZE/BANDAGES/DRESSINGS) ×1 IMPLANT
BNDG ELASTIC 3INX 5YD STR LF (GAUZE/BANDAGES/DRESSINGS) IMPLANT
BNDG ELASTIC 4INX 5YD STR LF (GAUZE/BANDAGES/DRESSINGS) ×2 IMPLANT
BNDG ESMARK 4X9 LF (GAUZE/BANDAGES/DRESSINGS) ×1 IMPLANT
BNDG GAUZE DERMACEA FLUFF 4 (GAUZE/BANDAGES/DRESSINGS) ×1 IMPLANT
BNDG GZE DERMACEA 4 6PLY (GAUZE/BANDAGES/DRESSINGS) ×1
CORD BIPOLAR FORCEPS 12FT (ELECTRODE) ×1 IMPLANT
COVER BACK TABLE 60X90IN (DRAPES) ×1 IMPLANT
CUFF TOURN SGL QUICK 18X4 (TOURNIQUET CUFF) IMPLANT
DERMABOND ADVANCED .7 DNX12 (GAUZE/BANDAGES/DRESSINGS) IMPLANT
DRAPE EXTREMITY T 121X128X90 (DISPOSABLE) ×1 IMPLANT
DRAPE SURG 17X23 STRL (DRAPES) ×1 IMPLANT
GAUZE PAD ABD 8X10 STRL (GAUZE/BANDAGES/DRESSINGS) ×1 IMPLANT
GAUZE SPONGE 4X4 12PLY STRL (GAUZE/BANDAGES/DRESSINGS) ×1 IMPLANT
GAUZE STRETCH 2X75IN STRL (MISCELLANEOUS) ×1 IMPLANT
GAUZE XEROFORM 1X8 LF (GAUZE/BANDAGES/DRESSINGS) ×1 IMPLANT
GLOVE BIO SURGEON STRL SZ7.5 (GLOVE) ×1 IMPLANT
GLOVE BIOGEL PI IND STRL 7.5 (GLOVE) ×1 IMPLANT
GOWN STRL REUS W/ TWL LRG LVL3 (GOWN DISPOSABLE) ×2 IMPLANT
GOWN STRL REUS W/TWL LRG LVL3 (GOWN DISPOSABLE) ×2
GOWN STRL REUS W/TWL XL LVL3 (GOWN DISPOSABLE) IMPLANT
NDL HYPO 25X5/8 SAFETYGLIDE (NEEDLE) ×1 IMPLANT
NEEDLE HYPO 25X5/8 SAFETYGLIDE (NEEDLE) ×1
NS IRRIG 1000ML POUR BTL (IV SOLUTION) IMPLANT
PACK BASIN DAY SURGERY FS (CUSTOM PROCEDURE TRAY) ×1 IMPLANT
PAD PREP 24X48 CUFFED NSTRL (MISCELLANEOUS) ×1 IMPLANT
SHEET MEDIUM DRAPE 40X70 STRL (DRAPES) ×1 IMPLANT
SPIKE FLUID TRANSFER (MISCELLANEOUS) IMPLANT
STOCKINETTE IMPERVIOUS LG (DRAPES) ×1 IMPLANT
SUCTION TUBE FRAZIER 10FR DISP (SUCTIONS) IMPLANT
SUT ETHILON 4 0 PS 2 18 (SUTURE) ×1 IMPLANT
SYR BULB EAR ULCER 3OZ GRN STR (SYRINGE) ×2 IMPLANT
SYR CONTROL 10ML LL (SYRINGE) IMPLANT
TOWEL GREEN STERILE FF (TOWEL DISPOSABLE) ×2 IMPLANT
TUBE CONNECTING 20X1/4 (TUBING) IMPLANT

## 2023-02-06 NOTE — Anesthesia Preprocedure Evaluation (Signed)
Anesthesia Evaluation  Patient identified by MRN, date of birth, ID band Patient awake    Reviewed: Allergy & Precautions, NPO status , Patient's Chart, lab work & pertinent test results  History of Anesthesia Complications (+) PONV and history of anesthetic complications  Airway Mallampati: II  TM Distance: >3 FB Neck ROM: Full    Dental  (+) Dental Advisory Given   Pulmonary neg pulmonary ROS   breath sounds clear to auscultation       Cardiovascular hypertension, Pt. on medications and Pt. on home beta blockers  Rhythm:Regular Rate:Normal     Neuro/Psych  Neuromuscular disease    GI/Hepatic negative GI ROS, Neg liver ROS,,,  Endo/Other    Morbid obesity  Renal/GU negative Renal ROS     Musculoskeletal  (+) Arthritis ,    Abdominal   Peds  Hematology  (+) Blood dyscrasia, anemia   Anesthesia Other Findings   Reproductive/Obstetrics                              Anesthesia Physical Anesthesia Plan  ASA: 2  Anesthesia Plan: General   Post-op Pain Management: Tylenol PO (pre-op)*   Induction: Intravenous  PONV Risk Score and Plan: 4 or greater and Midazolam, Dexamethasone, Ondansetron and Treatment may vary due to age or medical condition  Airway Management Planned: LMA  Additional Equipment:   Intra-op Plan:   Post-operative Plan: Extubation in OR  Informed Consent: I have reviewed the patients History and Physical, chart, labs and discussed the procedure including the risks, benefits and alternatives for the proposed anesthesia with the patient or authorized representative who has indicated his/her understanding and acceptance.       Plan Discussed with:   Anesthesia Plan Comments:          Anesthesia Quick Evaluation

## 2023-02-06 NOTE — Op Note (Signed)
NAME: Stacy Woods MEDICAL RECORD NO: [id number] DATE OF BIRTH: [birth year] FACILITY: [facility] LOCATION: [facility] PHYSICIAN: Samuella Cota, MD   OPERATIVE REPORT   DATE OF PROCEDURE: 02/06/23    PREOPERATIVE DIAGNOSIS: Left cubital tunnel syndrome   POSTOPERATIVE DIAGNOSIS: Left cubital tunnel syndrome   PROCEDURE: Left cubital tunnel release in situ   SURGEON:  Samuella Cota, M.D.   ASSISTANT: None   ANESTHESIA:  General   INTRAVENOUS FLUIDS:  Per anesthesia flow sheet.   ESTIMATED BLOOD LOSS:  Minimal.   COMPLICATIONS:  None.   SPECIMENS:  none   TOURNIQUET TIME:    Total Tourniquet Time Documented: Upper Arm (Left) - 13 minutes Total: Upper Arm (Left) - 13 minutes    DISPOSITION:  Stable to PACU.   INDICATIONS: This is a 57 year old female who was seen in the outpatient setting found to have clinical and electrodiagnostic evidence of left-sided cubital tunnel syndrome that was refractory to conservative care.  After discussion, patient was indicated for left open cubital tunnel release.  Risks and benefits of surgery were discussed including the risks of infection, bleeding, scarring, stiffness, nerve injury, vascular injury, tendon injury, need for subsequent operation, , recurrence.  She voiced understanding of these risks and elected to proceed.  OPERATIVE COURSE: Patient was seen and identified in the preoperative area and marked appropriately.  Surgical consent had been signed. Preoperative IV antibiotic prophylaxis was given. She was transferred to the operating room and placed in supine position with the Left upper extremity on an arm board.  General anesthesia was induced by the anesthesiologist.  Left upper extremity was prepped and draped in normal sterile orthopedic fashion.  A surgical pause was performed between the surgeons, anesthesia, and operating room staff and all were in agreement as to the patient, procedure, and site of  procedure.  Tourniquet was placed and padded appropriately.  The arm was exsanguinated and the tourniquet was inflated to 250 mmHg.  The cubital tunnel release was completed.  A 6 cm curvilinear incision was designed over the medial aspect of the elbow, centered over the medial epicondyle.  This incision was carried down through the subcutaneous tissues.  Crossing branches of the medial antebrachial cutaneous nerve were identified and carefully protected.  The ulnar nerve was identified just posterior to the medial epicondyle.  A complete release of the cubital tunnel was performed, including release of the arcade of Struthers, ligament of Osborne and the fascia overlying the flexor carpi ulnaris muscle bellies.  Following the cubital tunnel release, the elbow was passively taken through full range of motion.  There is no evidence of ulnar nerve instability or subluxation.  Tourniquet was subsequently deflated.  Hemostasis was achieved with bipolar electrocautery.  16 cc of 0.25% Bupivicaine was utilized for anesthetic purposes.  Copious irrigation was performed of the wound.  Wound was closed in layers utilizing 3-0 Monocryl for the subcutaneous layer and a 4-0 running Monocryl subcuticular stitch.  Sterile dressings were provided followed by application of a loosefitting wrap from the elbow down to the hand.  Sling was placed.  The tourniquet was deflated at 13 minutes.  Fingertips were pink with brisk capillary refill after deflation of tourniquet.  The operative drapes were broken down.  The patient was awoken from anesthesia safely and taken to PACU in stable condition.  I will see her back in the office in 2 weeks for postoperative followup.     Samuella Cota, MD Electronically signed, 02/06/23

## 2023-02-06 NOTE — Discharge Instructions (Addendum)
 Hand Surgery Postop Instructions   Dressings: Maintain postoperative dressing until orthopedic follow-up.  Keep operative site clean and dry until orthopedic follow-up.  Wound Care: Keep your hand elevated above the level of your heart.  Do not allow it to dangle by your side. Moving your fingers is advised to stimulate circulation but will depend on the site of your surgery.  If you have a splint applied, your doctor will advise you regarding movement.  Activity: Do not drive or operate machinery until clearance given from physician. No heavy lifting with operative extremity.  Diet:  Drink liquids today or eat a light diet.  You may resume a regular diet tomorrow.    General expectations: Take prescribed medication if given, transition to over-the-counter medication as quickly as possible. Fingers may become slightly swollen.  Call your doctor if any of the following occur: Severe pain not relieved by pain medication. Elevated temperature. Dressing soaked with blood. Inability to move fingers. White or bluish color to fingers.  Post Anesthesia Home Care Instructions  Activity: Get plenty of rest for the remainder of the day. A responsible individual must stay with you for 24 hours following the procedure.  For the next 24 hours, DO NOT: -Drive a car -Advertising copywriter -Drink alcoholic beverages -Take any medication unless instructed by your physician -Make any legal decisions or sign important papers.  Meals: Start with liquid foods such as gelatin or soup. Progress to regular foods as tolerated. Avoid greasy, spicy, heavy foods. If nausea and/or vomiting occur, drink only clear liquids until the nausea and/or vomiting subsides. Call your physician if vomiting continues.  Special Instructions/Symptoms: Your throat may feel dry or sore from the anesthesia or the breathing tube placed in your throat during surgery. If this causes discomfort, gargle with warm salt water. The  discomfort should disappear within 24 hours.  If you had a scopolamine patch placed behind your ear for the management of post- operative nausea and/or vomiting:  1. The medication in the patch is effective for 72 hours, after which it should be removed.  Wrap patch in a tissue and discard in the trash. Wash hands thoroughly with soap and water. 2. You may remove the patch earlier than 72 hours if you experience unpleasant side effects which may include dry mouth, dizziness or visual disturbances. 3. Avoid touching the patch. Wash your hands with soap and water after contact with the patch.

## 2023-02-06 NOTE — Transfer of Care (Signed)
Immediate Anesthesia Transfer of Care Note  Patient: Stacy Woods  Procedure(s) Performed: LEFT CUBITAL TUNNEL RELEASE (Left: Elbow)  Patient Location: PACU  Anesthesia Type:General  Level of Consciousness: drowsy  Airway & Oxygen Therapy: Patient Spontanous Breathing and Patient connected to face mask oxygen  Post-op Assessment: Report given to RN and Post -op Vital signs reviewed and stable  Post vital signs: Reviewed and stable  Last Vitals:  Vitals Value Taken Time  BP 131/93 02/06/23 1330  Temp    Pulse 78 02/06/23 1330  Resp 14 02/06/23 1330  SpO2 100 % 02/06/23 1330  Vitals shown include unfiled device data.  Last Pain:  Vitals:   02/06/23 1053  TempSrc: Oral  PainSc: 0-No pain      Patients Stated Pain Goal: 3 (02/06/23 1053)  Complications: No notable events documented.

## 2023-02-06 NOTE — Anesthesia Procedure Notes (Signed)
Procedure Name: LMA Insertion Date/Time: 02/06/2023 12:24 PM  Performed by: Burna Cash, CRNAPre-anesthesia Checklist: Patient identified, Emergency Drugs available, Suction available and Patient being monitored Patient Re-evaluated:Patient Re-evaluated prior to induction Oxygen Delivery Method: Circle system utilized Preoxygenation: Pre-oxygenation with 100% oxygen Induction Type: IV induction Ventilation: Mask ventilation without difficulty LMA: LMA inserted LMA Size: 4.0 Number of attempts: 1 Airway Equipment and Method: Bite block Placement Confirmation: positive ETCO2 Tube secured with: Tape Dental Injury: Teeth and Oropharynx as per pre-operative assessment

## 2023-02-06 NOTE — Interval H&P Note (Signed)
History and Physical Interval Note:  02/06/2023 11:50 AM  Stacy Woods  has presented today for surgery, with the diagnosis of LEFT CUBITAL TUNNEL SYNDROME.  The various methods of treatment have been discussed with the patient and family. After consideration of risks, benefits and other options for treatment, the patient has consented to  Procedure(s): LEFT CUBITAL TUNNEL RELEASE (Left) as a surgical intervention.  The patient's history has been reviewed, patient examined, no change in status, stable for surgery.  I have reviewed the patient's chart and labs.  Questions were answered to the patient's satisfaction.     Kayley Zeiders

## 2023-02-06 NOTE — Interval H&P Note (Signed)
History and Physical Interval Note:  02/06/2023 11:49 AM  Stacy Woods  has presented today for surgery, with the diagnosis of LEFT CUBITAL TUNNEL SYNDROME.  The various methods of treatment have been discussed with the patient and family. After consideration of risks, benefits and other options for treatment, the patient has consented to  Procedure(s): LEFT CUBITAL TUNNEL RELEASE (Left) as a surgical intervention.  The patient's history has been reviewed, patient examined, no change in status, stable for surgery.  I have reviewed the patient's chart and labs.  Questions were answered to the patient's satisfaction.     [name]

## 2023-02-10 ENCOUNTER — Encounter (HOSPITAL_BASED_OUTPATIENT_CLINIC_OR_DEPARTMENT_OTHER): Payer: Self-pay | Admitting: Orthopedic Surgery

## 2023-02-16 NOTE — Anesthesia Postprocedure Evaluation (Signed)
Anesthesia Post Note  Patient: Stacy Woods  Procedure(s) Performed: LEFT CUBITAL TUNNEL RELEASE (Left: Elbow)     Patient location during evaluation: PACU Anesthesia Type: General Level of consciousness: awake and alert Pain management: pain level controlled Vital Signs Assessment: post-procedure vital signs reviewed and stable Respiratory status: spontaneous breathing, nonlabored ventilation, respiratory function stable and patient connected to nasal cannula oxygen Cardiovascular status: blood pressure returned to baseline and stable Postop Assessment: no apparent nausea or vomiting Anesthetic complications: no   No notable events documented.  Last Vitals:  Vitals:   02/06/23 1345 02/06/23 1415  BP: (!) 141/93 (!) 125/97  Pulse: 83 74  Resp: 15   Temp:  (!) 36.3 C  SpO2: 95%     Last Pain:  Vitals:   02/10/23 0855  TempSrc:   PainSc: 0-No pain                 Kennieth Rad

## 2023-02-20 ENCOUNTER — Ambulatory Visit (INDEPENDENT_AMBULATORY_CARE_PROVIDER_SITE_OTHER): Payer: Managed Care, Other (non HMO) | Admitting: Orthopedic Surgery

## 2023-02-20 DIAGNOSIS — Z9889 Other specified postprocedural states: Secondary | ICD-10-CM

## 2023-02-20 NOTE — Progress Notes (Signed)
DAY VACCHIANO - 57 y.o. female MRN [id number]  Date of birth: 1965/06/28  Office Visit Note: Visit Date: 02/20/2023 PCP: Christen Butter, NP Referred by: Christen Butter, NP  Subjective:  HPI: Stacy Woods is a 57 y.o. female who presents today for follow up 2 weeks status post left cubital tunnel release.  She is doing very well overall, pain is well-controlled, numbness and tingling has improved significantly.  Pertinent ROS were reviewed with the patient and found to be negative unless otherwise specified above in HPI.   Assessment & Plan: Visit Diagnoses: No diagnosis found.  Plan: She has done very well postoperatively.  At this juncture, given her activity at baseline, she does not need formalized therapy.  She can resume activities as tolerated.  I will plan on seeing her back in approximate 1 month for recheck.  Follow-up: No follow-ups on file.   Meds & Orders: No orders of the defined types were placed in this encounter.  No orders of the defined types were placed in this encounter.    Procedures: No procedures performed       Objective:   Vital Signs: There were no vitals taken for this visit.  Ortho Exam Left elbow: - Well-healed incision over the medial aspect of the elbow, skin edges well-approximated without erythema or drainage - Full elbow range of motion without restriction, extension/flexion 0/150 - Sensation intact distally, 5/5 small finger FDP, negative Froment's  Imaging: No results found.   Mehtab Dolberry Trevor Mace, M.D. [facility] 8:00 AM

## 2023-02-25 ENCOUNTER — Ambulatory Visit (INDEPENDENT_AMBULATORY_CARE_PROVIDER_SITE_OTHER): Payer: Managed Care, Other (non HMO)

## 2023-02-25 DIAGNOSIS — Z23 Encounter for immunization: Secondary | ICD-10-CM | POA: Diagnosis not present

## 2023-03-08 ENCOUNTER — Encounter: Payer: Self-pay | Admitting: Medical-Surgical

## 2023-03-09 NOTE — Telephone Encounter (Signed)
Requesting rx rf of Stacy Woods written 05/01/224 And  Flexeril Last written 10/08/2022  Spoke

## 2023-03-28 ENCOUNTER — Encounter: Payer: Self-pay | Admitting: Medical-Surgical

## 2023-03-30 MED ORDER — CYCLOBENZAPRINE HCL 10 MG PO TABS: 10.0000 mg | ORAL_TABLET | Freq: Three times a day (TID) | ORAL | 1 refills | Status: DC | PRN

## 2023-04-10 ENCOUNTER — Encounter: Payer: Self-pay | Admitting: Medical-Surgical

## 2023-04-10 ENCOUNTER — Ambulatory Visit: Payer: Managed Care, Other (non HMO) | Admitting: Medical-Surgical

## 2023-04-10 VITALS — BP 111/80 | HR 70 | Resp 20 | Ht 64.0 in | Wt 241.0 lb

## 2023-04-10 DIAGNOSIS — R7303 Prediabetes: Secondary | ICD-10-CM

## 2023-04-10 DIAGNOSIS — I10 Essential (primary) hypertension: Secondary | ICD-10-CM | POA: Diagnosis not present

## 2023-04-10 DIAGNOSIS — E782 Mixed hyperlipidemia: Secondary | ICD-10-CM

## 2023-04-10 DIAGNOSIS — F3340 Major depressive disorder, recurrent, in remission, unspecified: Secondary | ICD-10-CM

## 2023-04-10 DIAGNOSIS — G4700 Insomnia, unspecified: Secondary | ICD-10-CM

## 2023-04-10 DIAGNOSIS — F411 Generalized anxiety disorder: Secondary | ICD-10-CM

## 2023-04-10 DIAGNOSIS — M797 Fibromyalgia: Secondary | ICD-10-CM | POA: Diagnosis not present

## 2023-04-10 LAB — POCT GLYCOSYLATED HEMOGLOBIN (HGB A1C)
HbA1c, POC (prediabetic range): 5.9 % (ref 5.7–6.4)
Hemoglobin A1C: 5.9 % — AB (ref 4.0–5.6)

## 2023-04-10 MED ORDER — ZOLPIDEM TARTRATE 5 MG PO TABS: 5.0000 mg | ORAL_TABLET | Freq: Every evening | ORAL | 1 refills | Status: DC | PRN

## 2023-04-10 NOTE — Progress Notes (Unsigned)
        Established patient visit  History, exam, impression, and plan:  1. Essential hypertension, benign Pleasant 57 year old female presenting today with a Hx of HTN currently managed with Toprol XL 50mg  daily, tolerating well without side effects. Checking BP at home with readings reported at goal. Continues to have BLE edema at times but no other concerning symptoms. Cardiopulmonary exam normal. Exercise limited by orthopedic issues and fibromyalgia. BP at goal today. Continue Toprol XL as prescribed.   2. Fibromyalgia Has had some flares recently which make it difficult to continue with regular intentional exercise and daily activities. Did not tolerate Cymbalta. Using Flexeril 10mg  TID prn during severe flares which helps. Ok to continue Flexeril as needed.   3. Insomnia, unspecified type Long hx of insomnia treated by Ambien 5mg  nightly. Tolerates the medication well without side effects. Without the medication, she is unable to sleep despite sleep hygiene measures. Continue Ambien as prescribed.   4. MDD (recurrent major depressive disorder) in remission (HCC) 5. Anxiety state Taking Lexapro 10mg  daily, tolerating well without side effects. Feels that the medication works well for her. Denies SI/HI. Does still have some stressors but they are more situational issues. Does not want to change medications or doses at this time. Continue Lexapro as prescribed.   6. Mixed hyperlipidemia Hx of HLD but not currently on medications. UTD on lipid check. Recommend working on a low fat heart healthy diet.   7. Prediabetes Hx of prediabetes. No current medications. Recheck of A1c 5.9%.  - POCT HgB A1C  8. Morbid obesity She continues to worry quite a bit about her weight but reports that she feels defeated since nothing she has done has been helpful. Has used phentermine in the past but this only provided temporary improvement followed by rapidly gaining weight back. Does not meet criteria for  insurance coverage for injectable weight loss medications. Discussed options for obtaining these medications (Wegovy or Zepbound) via online options but these will be out of pocket costs. Reviewed returning to [facility] but she did not have results from this before. She will consider her options then let me know if she would like to have a referral.   Procedures performed this visit: None.  Return in about 6 months (around 10/08/2023) for chronic disease follow up.  __________________________________ Thayer Ohm, DNP, APRN, FNP-BC Primary Care and Sports Medicine [facility]

## 2023-05-05 ENCOUNTER — Encounter: Payer: Self-pay | Admitting: Medical-Surgical

## 2023-05-05 MED ORDER — FREESTYLE LITE TEST VI STRP: ORAL_STRIP | 99 refills | Status: AC

## 2023-05-12 ENCOUNTER — Encounter: Payer: Managed Care, Other (non HMO) | Admitting: Rheumatology

## 2023-05-28 ENCOUNTER — Ambulatory Visit: Payer: Managed Care, Other (non HMO) | Admitting: Rheumatology

## 2023-06-05 ENCOUNTER — Encounter: Payer: Self-pay | Admitting: Medical-Surgical

## 2023-06-09 NOTE — Progress Notes (Signed)
 Office Visit Note  Patient: Stacy Woods             Date of Birth: [birth year]           MRN: [id number]             PCP: Willo Mini, NP Referring: Willo Mini, NP Visit Date: 06/23/2023 Occupation: @GUAROCC @  Subjective:  Pain in multiple joints  History of Present Illness: Stacy Woods is a 57 y.o. female with psoriasis and osteoarthritis.  She has been on Tremfya since March 2024.  She states her psoriasis has much improved.  She has only few lesions on her lower extremities.  She takes Tremfya on a regular basis.  She denies any history of joint swelling.  She continues to have some discomfort in her neck neck.  She states she has been experiencing some paresthesias in her hands.  She plans to see Dr. Eldonna.  She also has some lower back pain.  She continues to have some stiffness in her hands, knee joints and her feet.  She denies any history of plantar fasciitis or Achilles tendinitis.  She denies any history of uveitis.    Activities of Daily Living:  Patient reports morning stiffness for 1.5-3 hours.   Patient Reports nocturnal pain.  Difficulty dressing/grooming: Denies Difficulty climbing stairs: Reports Difficulty getting out of chair: Denies Difficulty using hands for taps, buttons, cutlery, and/or writing: Reports  Review of Systems  Constitutional:  Positive for fatigue.  HENT:  Positive for mouth dryness. Negative for mouth sores.   Eyes:  Positive for dryness.  Respiratory:  Negative for shortness of breath.   Cardiovascular:  Negative for chest pain and palpitations.  Gastrointestinal:  Negative for blood in stool, constipation and diarrhea.  Endocrine: Negative for increased urination.  Genitourinary:  Negative for involuntary urination.  Musculoskeletal:  Positive for joint pain, joint pain, joint swelling, myalgias, muscle weakness, morning stiffness, muscle tenderness and myalgias. Negative for gait problem.  Skin:  Positive for rash and sensitivity to  sunlight. Negative for color change and hair loss.  Allergic/Immunologic: Positive for susceptible to infections.  Neurological:  Positive for headaches. Negative for dizziness.  Hematological:  Negative for swollen glands.  Psychiatric/Behavioral:  Negative for depressed mood and sleep disturbance. The patient is not nervous/anxious.     PMFS History:  Patient Active Problem List   Diagnosis Date Noted   Cubital tunnel syndrome on left 02/06/2023   Bilateral lower extremity edema 09/26/2021   Avulsion fracture of left wrist 09/24/2021   Chondromalacia patellae, left knee 11/23/2020   Osteoarthritis of left knee 11/23/2020   Cyst of lateral meniscus of left knee 11/23/2020   Morning joint stiffness 08/28/2019   Constipation 08/28/2019   Right shoulder pain 07/27/2019   Glaucoma 07/21/2019   Transaminitis 12/01/2017   MDD (recurrent major depressive disorder) in remission (HCC) 11/03/2017   AK (actinic keratosis) 07/14/2017   Low magnesium  level 01/12/2017   B12 deficiency 01/01/2017   Trochanteric bursitis of left hip 10/08/2016   Malabsorption of iron  05/09/2016   Arthritis of knee, degenerative 04/14/2016   Surgical menopause 03/24/2016   Primary osteoarthritis of both knees 10/01/2015   Hypoglycemia 06/12/2015   Cubital tunnel syndrome on right 09/14/2014   Degenerative disc disease, cervical 07/27/2014   Fibromyalgia 04/11/2013   Essential hypertension, benign 04/11/2013   Anxiety state 04/11/2013   Iron  deficiency anemia 09/04/2011   Palpitations 09/04/2011   Fatigue 09/04/2011   History of gastric bypass 09/03/2011  Insomnia 08/16/2011   Osteoarthrosis, unspecified whether generalized or localized, lower leg 08/16/2011   Myalgia and myositis, unspecified 08/16/2011   Acquired absence of genital organ 08/16/2011   Irritable bowel syndrome 08/16/2011   Seborrheic dermatitis 08/16/2011   Mixed hyperlipidemia 08/16/2011   Morbid obesity (HCC) 08/16/2011    Past  Medical History:  Diagnosis Date   AK (actinic keratosis) 07/14/2017   Right upper chest wall   Anemia    Anemia, iron  deficiency 05/02/2015   Family history of breast cancer 03/24/2016   Fibromyalgia    History of gastric bypass 05/09/2016   Hypertension    Insomnia 08/14/2017   Malabsorption of iron  05/09/2016   PONV (postoperative nausea and vomiting)    Prediabetes 09/01/2013   March 2015    Seizure (HCC) 2018   from low blood sugar    Surgical menopause 03/24/2016    Family History  Problem Relation Age of Onset   Hypertension Mother    Breast cancer Mother    Diabetes Father    Hypertension Father    Stroke Father    Heart failure Father    CAD Father        has stents   Migraines Sister    Healthy Son    Healthy Daughter    Colon cancer Neg Hx    Colon polyps Neg Hx    Esophageal cancer Neg Hx    Stomach cancer Neg Hx    Rectal cancer Neg Hx    Past Surgical History:  Procedure Laterality Date   ABDOMINAL HYSTERECTOMY  06/10/1995   APPENDECTOMY     CESAREAN SECTION  8008,8005   CHOLECYSTECTOMY     CHONDROPLASTY Left 11/23/2020   Procedure: CHONDROPLASTY MEDIAL FEMORAL PATELLA;  Surgeon: Yvone Rush, MD;  Location: [facility];  Service: Orthopedics;  Laterality: Left;   GASTRIC BYPASS  06/09/2009   KNEE ARTHROSCOPY Left 06/09/2000   KNEE ARTHROSCOPY Left 10/2021   KNEE ARTHROSCOPY WITH EXCISION BAKER'S CYST Left 11/23/2020   Procedure: KNEE ARTHROSCOPY WITH ARTHROSCOPIC CYST EXCISION;  Surgeon: Yvone Rush, MD;  Location: [facility];  Service: Orthopedics;  Laterality: Left;   KNEE ARTHROSCOPY WITH LATERAL RELEASE Left 11/23/2020   Procedure: ARTHROSCOPY KNEE WITH LATERAL RETINACULAR RELEASE, PARTIAL LATERAL MENISCECTOMY;  Surgeon: Yvone Rush, MD;  Location: [facility];  Service: Orthopedics;  Laterality: Left;   TONSILLECTOMY  06/10/1975   ULNAR NERVE TRANSPOSITION Right 06/12/2015   Procedure: RIGHT  CUBITAL TUNNEL RELEASE;  Surgeon: Alm Hummer, MD;  Location: [facility];  Service: Orthopedics;  Laterality: Right;   ULNAR TUNNEL RELEASE Left 02/06/2023   Procedure: LEFT CUBITAL TUNNEL RELEASE;  Surgeon: Arlinda Buster, MD;  Location:  SURGERY CENTER;  Service: Orthopedics;  Laterality: Left;   Social History   Social History Narrative   Lives alone   Right-handed   Caffeine: <16 oz soda daily   Immunization History  Administered Date(s) Administered   Hepatitis B 10/20/2021   Hepb-cpg 10/10/2021   Influenza Whole 03/29/2013   Influenza, Seasonal, Injecte, Preservative Fre 02/25/2023   Influenza,inj,Quad PF,6+ Mos 03/24/2016, 03/23/2017, 03/10/2018, 02/04/2019, 03/02/2020, 02/14/2021, 04/01/2022   Influenza-Unspecified 03/09/2010, 03/10/2015   PFIZER(Purple Top)SARS-COV-2 Vaccination 06/24/2019, 07/13/2019, 03/27/2020   Td 06/10/2006   Tdap 03/24/2016   Tetanus Immune Globulin 06/09/1998   Zoster Recombinant(Shingrix ) 04/19/2018, 07/05/2018     Objective: Vital Signs: BP 128/86 (BP Location: Left Arm, Patient Position: Sitting, Cuff Size: Normal)   Pulse 72   Resp 14  Ht 5' 4 (1.626 m)   Wt 247 lb (112 kg)   BMI 42.40 kg/m    Physical Exam Vitals and nursing note reviewed.  Constitutional:      Appearance: She is well-developed.  HENT:     Head: Normocephalic and atraumatic.  Eyes:     Conjunctiva/sclera: Conjunctivae normal.  Cardiovascular:     Rate and Rhythm: Normal rate and regular rhythm.     Heart sounds: Normal heart sounds.  Pulmonary:     Effort: Pulmonary effort is normal.     Breath sounds: Normal breath sounds.  Abdominal:     General: Bowel sounds are normal.     Palpations: Abdomen is soft.  Musculoskeletal:     Cervical back: Normal range of motion.  Lymphadenopathy:     Cervical: No cervical adenopathy.  Skin:    General: Skin is warm and dry.     Capillary Refill: Capillary refill takes less than 2 seconds.      Comments: Few psoriasis patches were noted over the shin.  Neurological:     Mental Status: She is alert and oriented to person, place, and time.  Psychiatric:        Behavior: Behavior normal.      Musculoskeletal Exam: Cervical, thoracic and lumbar spine were in good range of motion.  She had no trapezius spasm.  Shoulders, elbows, wrist joints, MCPs PIPs and DIPs Juengel range of motion with no synovitis.  No dactylitis was noted.  Hip joints and knee joints were in good range of motion.  She had mild tenderness over left trochanteric bursa.  There was no tenderness over ankles or MTPs.  CDAI Exam: CDAI Score: -- Patient Global: --; Provider Global: -- Swollen: --; Tender: -- Joint Exam 06/23/2023   No joint exam has been documented for this visit   There is currently no information documented on the homunculus. Go to the Rheumatology activity and complete the homunculus joint exam.  Investigation: No additional findings.  Imaging: No results found.  Recent Labs: Lab Results  Component Value Date   WBC 5.6 10/08/2022   HGB 14.3 10/08/2022   PLT 376 10/08/2022   NA 143 10/08/2022   K 4.5 10/08/2022   CL 110 10/08/2022   CO2 24 10/08/2022   GLUCOSE 90 10/08/2022   BUN 10 10/08/2022   CREATININE 1.04 (H) 10/08/2022   BILITOT 0.4 10/08/2022   ALKPHOS 105 07/24/2020   AST 31 10/08/2022   ALT 31 (H) 10/08/2022   PROT 6.6 10/08/2022   ALBUMIN 4.7 07/24/2020   CALCIUM 9.2 10/08/2022   GFRAA 78 06/20/2020    Speciality Comments: Otezla -stopped due to insurance Orencia-an inadequate response Tremfya March 2024  Procedures:  No procedures performed Allergies: Lyrica  [pregabalin ], Percocet [oxycodone -acetaminophen ], Mobic [meloxicam], Naproxen , Nsaids, Prednisone , and Shellfish allergy   Assessment / Plan:     Visit Diagnoses: Psoriasis - Extensive psoriasis over shins.  Otezla -DC'd due to insurance coverage, Orencia -inadequate response.  Patient has been on  Tremfya since March 2024.  She has noticed gradual but definite improvement in her psoriasis.  She has only few lesions left on her shins.  Polyarthralgia-continues to have some joint pain and stiffness.  There is no history of plantar fasciitis, Achilles tendinitis or dactylitis.  No synovitis was noted on the examination.  Primary osteoarthritis of both hands -complains of some stiffness in her hands.  No synovitis was noted.  X-rays obtained in the past showed osteoarthritic changes.  Paresthesia of both hands -  Nerve conduction velocity showed ulnar neuropathy.  Patient states after the surgery the neuropathy resolved but now she has recurrence of paresthesias in her hands.  She believes it is coming from her cervical spine.  She plans to see Dr. Eldonna.  Avulsion fracture of left wrist-2023 - Status post surgery.  Ulnar neuropathy of left upper extremity-resolved per patient.  Primary osteoarthritis of both knees-she continues to have some discomfort in her knee joints.  No warmth swelling or effusion was noted.  Need for regular exercise was emphasized.  Trochanteric bursitis of left hip - Injected on January 13, 2023.  She had some relief from the trochanteric bursitis.  She still have some residual tenderness.  IT band stretches were discussed.  Primary osteoarthritis of both feet -no synovitis, dactylitis or plantar fasciitis was noted.  X-rays in the past showed osteoarthritic changes.  Pes cavus of both feet-use of arch support was advised.  Degenerative disc disease, cervical - She continues to have stiffness.  She had x-rays in the past which were unremarkable.  She complains of discomfort in her cervical spine.  She plans to see Dr. Eldonna.  Chronic midline low back pain without sciatica -chronic discomfort.  Facet joint arthropathy was noted on the previous x-rays.  Chronic SI joint pain-she had no SI joint tenderness on the examination today.  Fibromyalgia - Diagnosed 2003.   She continues to have some generalized pain and discomfort from fibromyalgia.  She is on Cymbalta  30 mg p.o. daily Flexeril  10 mg p.o. 3 times daily.  Benefits of stretching and water aerobics were discussed.  Other medical problems are listed as follows:  Mixed hyperlipidemia  Essential hypertension, benign-blood pressure was normal today at 128/86.  History of IBS  History of gastric bypass-2001  Malabsorption of iron   MDD (recurrent major depressive disorder) in remission (HCC)  AK (actinic keratosis)  Family history of breast cancer-mother  Orders: No orders of the defined types were placed in this encounter.  No orders of the defined types were placed in this encounter.    Follow-Up Instructions: Return in about 1 year (around 06/22/2024) for Osteoarthritis, Psoriasis.   Maya Nash, MD  Note - This record has been created using Animal nutritionist.  Chart creation errors have been sought, but may not always  have been located. Such creation errors do not reflect on  the standard of medical care.

## 2023-06-11 ENCOUNTER — Telehealth: Payer: Managed Care, Other (non HMO) | Admitting: Nurse Practitioner

## 2023-06-11 DIAGNOSIS — J0140 Acute pansinusitis, unspecified: Secondary | ICD-10-CM | POA: Diagnosis not present

## 2023-06-11 MED ORDER — AMOXICILLIN-POT CLAVULANATE 875-125 MG PO TABS
1.0000 | ORAL_TABLET | Freq: Two times a day (BID) | ORAL | 0 refills | Status: AC
Start: 2023-06-11 — End: 2023-06-18

## 2023-06-11 NOTE — Progress Notes (Signed)
E-Visit for Sinus Problems  We are sorry that you are not feeling well.  Here is how we plan to help!  Based on what you have shared with me it looks like you have sinusitis.  Sinusitis is inflammation and infection in the sinus cavities of the head.  Based on your presentation I believe you most likely have Acute Bacterial Sinusitis.  This is an infection caused by bacteria and is treated with antibiotics. I have prescribed Augmentin 875mg/125mg one tablet twice daily with food, for 7 days.  You may use an oral decongestant such as Mucinex D or if you have glaucoma or high blood pressure use plain Mucinex. Saline nasal spray help and can safely be used as often as needed for congestion.  If you develop worsening sinus pain, fever or notice severe headache and vision changes, or if symptoms are not better after completion of antibiotic, please schedule an appointment with a health care provider.    Sinus infections are not as easily transmitted as other respiratory infection, however we still recommend that you avoid close contact with loved ones, especially the very young and elderly.  Remember to wash your hands thoroughly throughout the day as this is the number one way to prevent the spread of infection!  Home Care: Only take medications as instructed by your medical team. Complete the entire course of an antibiotic. Do not take these medications with alcohol. A steam or ultrasonic humidifier can help congestion.  You can place a towel over your head and breathe in the steam from hot water coming from a faucet. Avoid close contacts especially the very young and the elderly. Cover your mouth when you cough or sneeze. Always remember to wash your hands.  Get Help Right Away If: You develop worsening fever or sinus pain. You develop a severe head ache or visual changes. Your symptoms persist after you have completed your treatment plan.  Make sure you Understand these instructions. Will  watch your condition. Will get help right away if you are not doing well or get worse.  Thank you for choosing an e-visit.  Your e-visit answers were reviewed by a board certified advanced clinical practitioner to complete your personal care plan. Depending upon the condition, your plan could have included both over the counter or prescription medications.  Please review your pharmacy choice. Make sure the pharmacy is open so you can pick up prescription now. If there is a problem, you may contact your provider through MyChart messaging and have the prescription routed to another pharmacy.  Your safety is important to us. If you have drug allergies check your prescription carefully.   For the next 24 hours you can use MyChart to ask questions about today's visit, request a non-urgent call back, or ask for a work or school excuse. You will get an email in the next two days asking about your experience. I hope that your e-visit has been valuable and will speed your recovery.   Meds ordered this encounter  Medications   amoxicillin-clavulanate (AUGMENTIN) 875-125 MG tablet    Sig: Take 1 tablet by mouth 2 (two) times daily for 7 days.    Dispense:  14 tablet    Refill:  0     I spent approximately 5 minutes reviewing the patient's history, current symptoms and coordinating their care today.   

## 2023-06-23 ENCOUNTER — Encounter: Payer: Self-pay | Admitting: Rheumatology

## 2023-06-23 ENCOUNTER — Ambulatory Visit: Payer: Managed Care, Other (non HMO) | Attending: Rheumatology | Admitting: Rheumatology

## 2023-06-23 VITALS — BP 128/86 | HR 72 | Resp 14 | Ht 64.0 in | Wt 247.0 lb

## 2023-06-23 DIAGNOSIS — I10 Essential (primary) hypertension: Secondary | ICD-10-CM

## 2023-06-23 DIAGNOSIS — F3340 Major depressive disorder, recurrent, in remission, unspecified: Secondary | ICD-10-CM

## 2023-06-23 DIAGNOSIS — K909 Intestinal malabsorption, unspecified: Secondary | ICD-10-CM

## 2023-06-23 DIAGNOSIS — M533 Sacrococcygeal disorders, not elsewhere classified: Secondary | ICD-10-CM

## 2023-06-23 DIAGNOSIS — M19042 Primary osteoarthritis, left hand: Secondary | ICD-10-CM

## 2023-06-23 DIAGNOSIS — L409 Psoriasis, unspecified: Secondary | ICD-10-CM | POA: Diagnosis not present

## 2023-06-23 DIAGNOSIS — M5030 Other cervical disc degeneration, unspecified cervical region: Secondary | ICD-10-CM

## 2023-06-23 DIAGNOSIS — R202 Paresthesia of skin: Secondary | ICD-10-CM | POA: Diagnosis not present

## 2023-06-23 DIAGNOSIS — M19041 Primary osteoarthritis, right hand: Secondary | ICD-10-CM

## 2023-06-23 DIAGNOSIS — M2550 Pain in unspecified joint: Secondary | ICD-10-CM

## 2023-06-23 DIAGNOSIS — M19072 Primary osteoarthritis, left ankle and foot: Secondary | ICD-10-CM

## 2023-06-23 DIAGNOSIS — E782 Mixed hyperlipidemia: Secondary | ICD-10-CM

## 2023-06-23 DIAGNOSIS — S62102A Fracture of unspecified carpal bone, left wrist, initial encounter for closed fracture: Secondary | ICD-10-CM

## 2023-06-23 DIAGNOSIS — M545 Low back pain, unspecified: Secondary | ICD-10-CM

## 2023-06-23 DIAGNOSIS — M7062 Trochanteric bursitis, left hip: Secondary | ICD-10-CM

## 2023-06-23 DIAGNOSIS — M797 Fibromyalgia: Secondary | ICD-10-CM

## 2023-06-23 DIAGNOSIS — G5622 Lesion of ulnar nerve, left upper limb: Secondary | ICD-10-CM

## 2023-06-23 DIAGNOSIS — Z803 Family history of malignant neoplasm of breast: Secondary | ICD-10-CM

## 2023-06-23 DIAGNOSIS — Z8719 Personal history of other diseases of the digestive system: Secondary | ICD-10-CM

## 2023-06-23 DIAGNOSIS — Q6672 Congenital pes cavus, left foot: Secondary | ICD-10-CM

## 2023-06-23 DIAGNOSIS — Z9884 Bariatric surgery status: Secondary | ICD-10-CM

## 2023-06-23 DIAGNOSIS — M170 Bilateral primary osteoarthritis of knee: Secondary | ICD-10-CM

## 2023-06-23 DIAGNOSIS — Q6671 Congenital pes cavus, right foot: Secondary | ICD-10-CM

## 2023-06-23 DIAGNOSIS — G8929 Other chronic pain: Secondary | ICD-10-CM

## 2023-06-23 DIAGNOSIS — M19071 Primary osteoarthritis, right ankle and foot: Secondary | ICD-10-CM

## 2023-06-23 DIAGNOSIS — L570 Actinic keratosis: Secondary | ICD-10-CM

## 2023-06-25 ENCOUNTER — Other Ambulatory Visit: Payer: Self-pay | Admitting: Medical-Surgical

## 2023-06-25 DIAGNOSIS — Z1231 Encounter for screening mammogram for malignant neoplasm of breast: Secondary | ICD-10-CM

## 2023-07-06 ENCOUNTER — Encounter: Payer: Self-pay | Admitting: Family

## 2023-07-06 ENCOUNTER — Ambulatory Visit: Payer: Managed Care, Other (non HMO) | Admitting: Orthopedic Surgery

## 2023-07-06 DIAGNOSIS — M25522 Pain in left elbow: Secondary | ICD-10-CM

## 2023-07-06 DIAGNOSIS — M25521 Pain in right elbow: Secondary | ICD-10-CM | POA: Diagnosis not present

## 2023-07-06 NOTE — Progress Notes (Signed)
Stacy Woods - 58 y.o. female MRN [id number]  Date of birth: September 23, 1965  Office Visit Note: Visit Date: 07/06/2023 PCP: Christen Butter, NP Referred by: Christen Butter, NP  Subjective:  HPI: Stacy Woods is a 58 y.o. female with history of psoriasis and fibromyalgia who presents today with ongoing numbness and tingling in the radial sided digits of bilateral hands.  She states that her symptoms are stemming all the way down her arms.  She has a history of prior bilateral cubital tunnel release.  She is also describing some pain along the lateral aspect of bilateral elbows with radiating pain up the forearms, particularly with lifting.  Pertinent ROS were reviewed with the patient and found to be negative unless otherwise specified above in HPI.   Assessment & Plan: Visit Diagnoses:  1. Bilateral elbow joint pain     Plan: Based upon her clinical exam and history, there does appear to be a radicular component of her symptoms of the bilateral hands.  She has a recent EMG from July of last year which was isolated to a diagnosis of left-sided cubital tunnel syndrome for which she has undergone cubital tunnel release for.  For the bilateral elbows, she does have signs symptoms consistent with lateral epicondylitis which has not yet been treated.  She was fitted for bilateral counterforce brace straps today for symptom relief.  She also be given referral to occupational therapy for tendon glides.  I feel that she may respond to occupational therapy for both tendon glides and nerve gliding for the bilateral median nerve symptoms she is experiencing as well.  She has some clinical signs and symptoms consistent with carpal tunnel syndrome, however her recent EMG did not show any significant median nerve compression at the level of the wrist.  Referral will be placed Dr. Ophelia Charter for evaluation of potential cervical radiculopathy.  Referral will also be placed occupational therapy for the bilateral lateral  epicondylitis.  She is welcome to return to me as needed moving forward.  Greater than 30 minutes was spent in the care of this patient today.  Follow-up: No follow-ups on file.   Meds & Orders: No orders of the defined types were placed in this encounter.   Orders Placed This Encounter  Procedures   Ambulatory referral to Occupational Therapy     Procedures: No procedures performed       Objective:   Vital Signs: There were no vitals taken for this visit.  Ortho Exam PHYSICAL EXAM:  General: Patient is well appearing and in no distress. Positive Spurling's bilaterally, left greater than right.  Skin and Muscle: Well-healed bilateral cubital tunnel incisions  Range of Motion and Palpation Tests: Mobility is full about the elbows with flexion and extension.  Forearm supination and pronation are 85/85 bilaterally.  Wrist flexion/extension is 75/65 bilaterally.  Digital flexion and extension are full.  Thumb opposition is full to the base of the small fingers bilaterally.    No cords or nodules are palpated.  No triggering is observed.     Neurologic, Vascular, Motor: Sensation is intact to light touch in the median/radial/ulnar distributions.  Tinel's testing mildly positive bilateral carpal tunnel.   Phalen's positive bilaterally, Derkan's compression positive bilaterally  Fingers pink and well perfused.  Capillary refill is brisk.      Lab Results  Component Value Date   HGBA1C 5.9 (A) 04/10/2023   HGBA1C 5.9 04/10/2023     Imaging: No results found.   Loden Laurent (Ash) Aymen Widrig,  M.D. [facility] 3:12 PM

## 2023-07-15 ENCOUNTER — Ambulatory Visit: Payer: Managed Care, Other (non HMO)

## 2023-07-15 DIAGNOSIS — Z1231 Encounter for screening mammogram for malignant neoplasm of breast: Secondary | ICD-10-CM

## 2023-07-16 NOTE — Therapy (Signed)
 OUTPATIENT OCCUPATIONAL THERAPY ORTHO EVALUATION  Patient Name: Stacy Woods MRN: [id number] DOB:04-Jan-1966, 58 y.o., female Today's Date: 07/17/2023  PCP: Willo Mini, NP REFERRING PROVIDER:  Arlinda Buster, MD    END OF SESSION:  OT End of Session - 07/17/23 1027     Visit Number 1    Number of Visits 12    Date for OT Re-Evaluation 08/28/23    Authorization Type [employer]    OT Start Time 1027    OT Stop Time 1114    OT Time Calculation (min) 47 min             Past Medical History:  Diagnosis Date   AK (actinic keratosis) 07/14/2017   Right upper chest wall   Anemia    Anemia, iron  deficiency 05/02/2015   Family history of breast cancer 03/24/2016   Fibromyalgia    History of gastric bypass 05/09/2016   Hypertension    Insomnia 08/14/2017   Malabsorption of iron  05/09/2016   PONV (postoperative nausea and vomiting)    Prediabetes 09/01/2013   March 2015    Seizure (HCC) 2018   from low blood sugar    Surgical menopause 03/24/2016   Past Surgical History:  Procedure Laterality Date   ABDOMINAL HYSTERECTOMY  06/10/1995   APPENDECTOMY     CESAREAN SECTION  8008,8005   CHOLECYSTECTOMY     CHONDROPLASTY Left 11/23/2020   Procedure: CHONDROPLASTY MEDIAL FEMORAL PATELLA;  Surgeon: Yvone Rush, MD;  Location: [facility];  Service: Orthopedics;  Laterality: Left;   GASTRIC BYPASS  06/09/2009   KNEE ARTHROSCOPY Left 06/09/2000   KNEE ARTHROSCOPY Left 10/2021   KNEE ARTHROSCOPY WITH EXCISION BAKER'S CYST Left 11/23/2020   Procedure: KNEE ARTHROSCOPY WITH ARTHROSCOPIC CYST EXCISION;  Surgeon: Yvone Rush, MD;  Location: [facility];  Service: Orthopedics;  Laterality: Left;   KNEE ARTHROSCOPY WITH LATERAL RELEASE Left 11/23/2020   Procedure: ARTHROSCOPY KNEE WITH LATERAL RETINACULAR RELEASE, PARTIAL LATERAL MENISCECTOMY;  Surgeon: Yvone Rush, MD;  Location: [facility];  Service: Orthopedics;  Laterality: Left;    TONSILLECTOMY  06/10/1975   ULNAR NERVE TRANSPOSITION Right 06/12/2015   Procedure: RIGHT CUBITAL TUNNEL RELEASE;  Surgeon: Alm Hummer, MD;  Location: [facility];  Service: Orthopedics;  Laterality: Right;   ULNAR TUNNEL RELEASE Left 02/06/2023   Procedure: LEFT CUBITAL TUNNEL RELEASE;  Surgeon: Arlinda Buster, MD;  Location: [facility];  Service: Orthopedics;  Laterality: Left;   Patient Active Problem List   Diagnosis Date Noted   Cubital tunnel syndrome on left 02/06/2023   Bilateral lower extremity edema 09/26/2021   Avulsion fracture of left wrist 09/24/2021   Chondromalacia patellae, left knee 11/23/2020   Osteoarthritis of left knee 11/23/2020   Cyst of lateral meniscus of left knee 11/23/2020   Morning joint stiffness 08/28/2019   Constipation 08/28/2019   Right shoulder pain 07/27/2019   Glaucoma 07/21/2019   Transaminitis 12/01/2017   MDD (recurrent major depressive disorder) in remission (HCC) 11/03/2017   AK (actinic keratosis) 07/14/2017   Low magnesium  level 01/12/2017   B12 deficiency 01/01/2017   Trochanteric bursitis of left hip 10/08/2016   Malabsorption of iron  05/09/2016   Arthritis of knee, degenerative 04/14/2016   Surgical menopause 03/24/2016   Primary osteoarthritis of both knees 10/01/2015   Hypoglycemia 06/12/2015   Cubital tunnel syndrome on right 09/14/2014   Degenerative disc disease, cervical 07/27/2014   Fibromyalgia 04/11/2013   Essential hypertension, benign 04/11/2013   Anxiety state  04/11/2013   Iron  deficiency anemia 09/04/2011   Palpitations 09/04/2011   Fatigue 09/04/2011   History of gastric bypass 09/03/2011   Insomnia 08/16/2011   Osteoarthrosis, unspecified whether generalized or localized, lower leg 08/16/2011   Myalgia and myositis, unspecified 08/16/2011   Acquired absence of genital organ 08/16/2011   Irritable bowel syndrome 08/16/2011   Seborrheic dermatitis 08/16/2011   Mixed  hyperlipidemia 08/16/2011   Morbid obesity (HCC) 08/16/2011    ONSET DATE: Approximately 4 to 5 months onset  REFERRING DIAG: M25.521,M25.522 (ICD-10-CM) - Bilateral elbow joint pain   THERAPY DIAG:  Pain in right elbow  Pain in left elbow  Paresthesia of skin  Muscle weakness (generalized)  Stiffness of left wrist, not elsewhere classified  Rationale for Evaluation and Treatment: Rehabilitation  SUBJECTIVE:   SUBJECTIVE STATEMENT: She states pain in both lateral elbows as well as tingling in the first 3 digits of both hands.  Lt arm and elbow hurts worse than right.  This has been going on since Sept 2024 after CuTR.  She does a lot of computer work. She states night paresthesia but worst when in recliner holding phone.  She also rides a motorcycle and rides horses and both activities hurt her.    PERTINENT HISTORY: Per referral: Bilateral tennis elbow, median nerve glides  Per MD note: Based upon her clinical exam and history, there does appear to be a radicular component of her symptoms of the bilateral hands.  She has a recent EMG from July of last year which was isolated to a diagnosis of left-sided cubital tunnel syndrome for which she has undergone cubital tunnel release for.   For the bilateral elbows, she does have signs symptoms consistent with lateral epicondylitis which has not yet been treated.  She was fitted for bilateral counterforce brace straps today for symptom relief.  She also be given referral to occupational therapy for tendon glides.  I feel that she may respond to occupational therapy for both tendon glides and nerve gliding for the bilateral median nerve symptoms she is experiencing as well.  She has some clinical signs and symptoms consistent with carpal tunnel syndrome, however her recent EMG did not show any significant median nerve compression at the level of the wrist.    PRECAUTIONS: History of cervical issues and occipital neuritis,  fibromyalgia  RED FLAGS: None ; WEIGHT BEARING RESTRICTIONS: No  PAIN:  Are you having pain? Yes: NPRS scale: 8/10 in Lt elbow at rest goes up to a 10/10 in day with normal activities  Pain location: Lt lat elbow  Pain description: Sharp at times and aching others Aggravating factors: Weightbearing wrist extension Relieving factors: Rest  FALLS: Has patient fallen in last 6 months? No  LIVING ENVIRONMENT: Lives with: lives alone Lives in: House/apartment Has following equipment at home: None  PLOF: Independent  PATIENT GOALS: To improve pain and bilateral elbows as well as paresthesia in fingers to have better ability for work and home tasks  NEXT MD VISIT: As needed    OBJECTIVE: (All objective assessments below are from initial evaluation on: 07/17/2023 unless otherwise specified.)   HAND DOMINANCE: Right   ADLs: Overall ADLs: States decreased ability to grab, hold household objects, pain and difficulty to open containers, perform FMS tasks (manipulate fasteners on clothing)   FUNCTIONAL OUTCOME MEASURES: Eval: Quick DASH 61% impairment today  (Higher % Score  =  More Impairment)     UPPER EXTREMITY ROM    Eval: Symptoms are in both left  and right upper extremities, though the left is much worse and this was taken as a primary example for objective measures.  Shoulder to Wrist AROM Left eval  Shoulder flexion 156  Shoulder abduction   Shoulder extension   Shoulder internal rotation 51  Shoulder external rotation 82  Elbow flexion 141 pulls  Elbow extension (-9) pulls  Forearm supination 67  Forearm pronation  88  Wrist flexion 60  Wrist extension 54  (Blank rows = not tested)   Hand AROM Right eval Left eval  Full Fist Ability (or Gap to Distal Palmar Crease) full full  Thumb Opposition  (Kapandji Scale)  full full  (Blank rows = not tested)   UPPER EXTREMITY MMT:    Eval: Symptoms are in both left and right upper extremities, though the left is much  worse and this was taken as a primary example for objective measures.  MMT Left TBD  Shoulder internal rotation   Shoulder external rotation   Middle trapezius   Lower trapezius   Elbow flexion   Elbow extension   Forearm supination 4 -/5 tender  Forearm pronation 5/5  Wrist flexion 5/5  Wrist extension 4 -/5 tender  Wrist ulnar deviation   Wrist radial deviation   (Blank rows = not tested)  HAND FUNCTION: Eval: Observed weakness in affected Lt hand.  Grip strength Right: 25.6 lbs weak, non-tender, Left: 15 lbs tender  COORDINATION: Eval: No significant coordination problems other than gross motor skills that hurt the lateral epicondyle  SENSATION: Eval:  Light touch intact today, though states some paresthesia in tips of digits 1 through 3 bilaterally, left hand worse than right hand.  Median nerve does appear to be sensitive to provocative testing as listed in observations  EDEMA:   Eval: None significant at eval  COGNITION: Eval: Overall cognitive status: WFL for evaluation today   OBSERVATIONS:   Eval: positive resisted wrist ext test with TTP to lateral elbow (primarily Lt UE),  negative scratch collapse at carpal tunnel and pronator but positive Tinel's at both sites with tender, spasming biceps, positive Phalen's test ~3 seconds   Presents as: bilateral median nerve compression as well as bilateral tennis elbow, left worse than right  TODAY'S TREATMENT:  Post-evaluation treatment:   She was given initial self-care/safety recommendations to avoid exacerbating elbow pain including information on avoiding provocative lifting and postures, as well as other info, including the proper wear and use of a counterforce/tennis elbow strap that she claims to have at home.SABRA  She was also given neuromuscular reeducation on nervous anatomy, nerve entrapment sites, preventing nerve entrapment and also nerve gliding to help free entrapments.   Additionally she was given the  following home exercise program to perform at least 3-4 times a day nonpainful he-each 1 was quickly explained to her, it was also explained that if any hurt her or bother her that she should withhold them until next session or we can work on them together.  She states understanding all directions today  Exercises - Standing Radial Nerve Glide  - 4-6 x daily - 1 sets - 10-15 reps - Fridge Door Stretch  - 4 x daily - 3-5 reps - 15 sec hold - Forearm Pronation Stretch  - 3-4 x daily - 3-5 reps - 15 sec hold - Wrist Flexion Stretch  - 4 x daily - 3-5 reps - 15 sec hold - Wrist Prayer Stretch  - 4 x daily - 3-5 reps - 15 sec hold - Median  Nerve Flossing  - 2-3 x daily - 5-10 reps    PATIENT EDUCATION: Education details: See tx section above for details  Person educated: Patient Education method: Verbal Instruction, Teach back, Handouts  Education comprehension: States and demonstrates understanding, Additional Education required    HOME EXERCISE PROGRAM: Access Code: [email or web address] URL: [email or web address] Date: 07/17/2023 Prepared by: Melvenia Ada   GOALS: Goals reviewed with patient? Yes   SHORT TERM GOALS: (STG required if POC>30 days) Target Date: 07/24/2023  Pt will obtain protective, custom orthotic. Goal status: TBD/PRN  2.  Pt will demo/state understanding of initial HEP to improve pain levels and prerequisite motion. Goal status: INITIAL   LONG TERM GOALS: Target Date: 08/28/2023  Pt will improve functional ability by decreased impairment per Quick DASH assessment from 61% to 30% or better, for better quality of life. Goal status: INITIAL  2.  Pt will improve grip strength in left hand from tender 15 lbs to at least 25 lbs for functional use at home and in IADLs. Goal status: INITIAL  3.  Pt will improve A/ROM in left wrist flexion/extension from 60/54 to at least 65 degrees each, to have functional motion for tasks like reach and grasp.  Goal status:  INITIAL  4.  Pt will improve strength in left wrist extension from tender 4 -/5 MMT to at least 4+/5 MMT to have increased functional ability to carry out selfcare and higher-level homecare tasks with less difficulty. Goal status: INITIAL  5.  Pt will decrease pain at rest from 8/10 to 3/10 or better to have better sleep and occupational participation in daily roles. Goal status: INITIAL   ASSESSMENT:  CLINICAL IMPRESSION: Patient is a 58 y.o. female who was seen today for occupational therapy evaluation for pain, weakness, paresthesias in fingertips associated with bilateral median nerve compression as well as bilateral tennis elbow, left worse than right.  She will benefit from outpatient occupational therapy to decrease symptoms and increase strength, quality of life.  PERFORMANCE DEFICITS: in functional skills including ADLs, IADLs, coordination, sensation, ROM, strength, pain, fascial restrictions, muscle spasms, flexibility, Gross motor control, body mechanics, endurance, decreased knowledge of precautions, and UE functional use, cognitive skills including problem solving and safety awareness, and psychosocial skills including coping strategies, environmental adaptation, habits, and routines and behaviors.   IMPAIRMENTS: are limiting patient from ADLs, IADLs, rest and sleep, work, and leisure.   COMORBIDITIES: has co-morbidities such as fibromyalgia, hypertension, anxiety, insomnia, occipital neuritis, morbid obesity, and others  that affects occupational performance. Patient will benefit from skilled OT to address above impairments and improve overall function.  MODIFICATION OR ASSISTANCE TO COMPLETE EVALUATION: Min-Moderate modification of tasks or assist with assess necessary to complete an evaluation.  OT OCCUPATIONAL PROFILE AND HISTORY: Detailed assessment: Review of records and additional review of physical, cognitive, psychosocial history related to current functional  performance.  CLINICAL DECISION MAKING: High - multiple treatment options, significant modification of task necessary  REHAB POTENTIAL: Good  EVALUATION COMPLEXITY: Moderate      PLAN:  OT FREQUENCY: 1-2x/week  OT DURATION: 6 weeks through 08/28/2023 and up to 12 total visits as needed  PLANNED INTERVENTIONS: 97168 OT Re-evaluation, 97535 self care/ADL training, 02889 therapeutic exercise, 97530 therapeutic activity, 97112 neuromuscular re-education, 97140 manual therapy, 97035 ultrasound, 97039 fluidotherapy, 97010 moist heat, 97010 cryotherapy, 97032 electrical stimulation (manual), 97760 Orthotics management and training, 02239 Splinting (initial encounter), [id number] Subsequent splinting/medication, scar mobilization, compression bandaging, Dry needling, coping strategies training, patient/family education, and DME and/or AE instructions  RECOMMENDED OTHER SERVICES: None now   CONSULTED AND AGREED WITH PLAN OF CARE: Patient  PLAN FOR NEXT SESSION:   Review initial recommendations and HEP for bilateral median nerve compression as well as bilateral tennis elbow, left worse than right   Melvenia Ada, OTR/L, CHT 07/17/2023, 12:38 PM

## 2023-07-17 ENCOUNTER — Ambulatory Visit: Payer: Managed Care, Other (non HMO) | Admitting: Rehabilitative and Restorative Service Providers"

## 2023-07-17 ENCOUNTER — Encounter: Payer: Self-pay | Admitting: Rehabilitative and Restorative Service Providers"

## 2023-07-17 DIAGNOSIS — M25521 Pain in right elbow: Secondary | ICD-10-CM

## 2023-07-17 DIAGNOSIS — M25632 Stiffness of left wrist, not elsewhere classified: Secondary | ICD-10-CM

## 2023-07-17 DIAGNOSIS — M6281 Muscle weakness (generalized): Secondary | ICD-10-CM | POA: Diagnosis not present

## 2023-07-17 DIAGNOSIS — R202 Paresthesia of skin: Secondary | ICD-10-CM

## 2023-07-17 DIAGNOSIS — M25522 Pain in left elbow: Secondary | ICD-10-CM | POA: Diagnosis not present

## 2023-07-20 ENCOUNTER — Encounter: Payer: Self-pay | Admitting: Medical-Surgical

## 2023-07-22 ENCOUNTER — Encounter: Payer: Managed Care, Other (non HMO) | Admitting: Rehabilitative and Restorative Service Providers"

## 2023-07-22 ENCOUNTER — Ambulatory Visit: Payer: Managed Care, Other (non HMO) | Admitting: Orthopedic Surgery

## 2023-07-24 ENCOUNTER — Encounter: Payer: Managed Care, Other (non HMO) | Admitting: Rehabilitative and Restorative Service Providers"

## 2023-07-27 ENCOUNTER — Ambulatory Visit: Payer: Managed Care, Other (non HMO) | Admitting: Orthopedic Surgery

## 2023-07-28 ENCOUNTER — Encounter: Payer: Managed Care, Other (non HMO) | Admitting: Rehabilitative and Restorative Service Providers"

## 2023-07-30 ENCOUNTER — Encounter: Payer: Managed Care, Other (non HMO) | Admitting: Rehabilitative and Restorative Service Providers"

## 2023-08-03 ENCOUNTER — Encounter: Payer: Managed Care, Other (non HMO) | Admitting: Rehabilitative and Restorative Service Providers"

## 2023-08-06 ENCOUNTER — Other Ambulatory Visit: Payer: Self-pay | Admitting: Medical-Surgical

## 2023-08-10 ENCOUNTER — Encounter: Payer: Managed Care, Other (non HMO) | Admitting: Rehabilitative and Restorative Service Providers"

## 2023-08-17 ENCOUNTER — Encounter: Payer: Managed Care, Other (non HMO) | Admitting: Rehabilitative and Restorative Service Providers"

## 2023-08-24 ENCOUNTER — Encounter: Payer: Managed Care, Other (non HMO) | Admitting: Rehabilitative and Restorative Service Providers"

## 2023-09-03 ENCOUNTER — Ambulatory Visit: Payer: Self-pay | Admitting: *Deleted

## 2023-09-03 ENCOUNTER — Encounter: Payer: Self-pay | Admitting: Family Medicine

## 2023-09-03 ENCOUNTER — Ambulatory Visit: Admitting: Family Medicine

## 2023-09-03 ENCOUNTER — Ambulatory Visit: Payer: Self-pay

## 2023-09-03 VITALS — BP 114/86 | HR 80 | Ht 64.0 in | Wt 252.0 lb

## 2023-09-03 DIAGNOSIS — R2242 Localized swelling, mass and lump, left lower limb: Secondary | ICD-10-CM

## 2023-09-03 DIAGNOSIS — R238 Other skin changes: Secondary | ICD-10-CM

## 2023-09-03 MED ORDER — FUROSEMIDE 20 MG PO TABS: 20.0000 mg | ORAL_TABLET | Freq: Every day | ORAL | 0 refills | Status: DC | PRN

## 2023-09-03 NOTE — Telephone Encounter (Signed)
 Patient called, left VM to return the call to the office to speak to NT.

## 2023-09-03 NOTE — Telephone Encounter (Signed)
 See other message

## 2023-09-03 NOTE — Telephone Encounter (Signed)
 Copied from CRM [id number]. Topic: Clinical - Red Word Triage >> Sep 03, 2023 11:02 AM Fuller Mandril wrote: Red Word that prompted transfer to Nurse Triage: Swelling left foot started as blister - unsure if bite  Caller disconnected before transfer- attempted to call patient back- no answer- left message to call office.

## 2023-09-03 NOTE — Addendum Note (Signed)
 Addended by: Deno Etienne on: 09/03/2023 04:14 PM   Modules accepted: Orders

## 2023-09-03 NOTE — Telephone Encounter (Signed)
 Unable to reach patient after 3 attempts by E2C2 NT, routing to the provider for resolution per protocol.

## 2023-09-03 NOTE — Telephone Encounter (Signed)
 Chief Complaint:  blister Symptoms: L foot blister, leg swelling, redness Frequency: blister since Sunday, swelling since yesterday Pertinent Negatives: Patient denies CP, SOB, heat, knot under the skin, numbness/tingling Disposition: [] ED /[] Urgent Care (no appt availability in office) / [x] Appointment(In office/virtual)/ []  [place] [facility]/ [] Home Care/ [] Refused Recommended Disposition /[] [place] [facility]/ []  Follow-up with PCP Additional Notes: Pt reports a blister to the top of her L foot where her foot meets her ankle. Blister appeared Sunday. Pt is not sure why or how this blister developed. Denies wearing new shoes, any injury, insect bite. Blister has redness localized to that area. Pt states the blister drains and then fills with fluid again. Yesterday pt noticed swelling to the L leg. Endorses swelling from her knee to her foot. Denies redness to the leg. She is still able to walk. Denies that it is painful to the touch, but states it is uncomfortable because it is tight and swollen. Denies knot under the skin. Denies color change. Denies heat. No hx of DVT. Denies CP or SOB. Denies N/V, fever, chills, numbness/tingling. Per protocol RN advised pt she should be seen within 4 hours. Pt agreeable. RN scheduled pt for 1520 today in the office. RN advised pt if she worsens she should call us back and that if she develops CP or SOB she needs to go to the ED. Pt verbalized understanding.    Copied from CRM [id number]. Topic: Clinical - Red Word Triage >> Sep 03, 2023  1:28 PM Carlatta H wrote: [facility] that prompted transfer to Nurse Triage: Patient has blister on left leg since Sunday//Leg is swollen, red and painful// Reason for Disposition  [1] Red area or streak [2] large (> 2 in. or 5 cm)  Answer Assessment - Initial Assessment Questions 1. ONSET: "When did the swelling start?" (e.g., minutes, hours, days)     Sunday - blister located to the top of her foot. Swelling started  yesterday. 2. LOCATION: "What part of the leg is swollen?"  "Are both legs swollen or just one leg?"     L leg 3. SEVERITY: "How bad is the swelling?" (e.g., localized; mild, moderate, severe)   - Localized: Small area of swelling localized to one leg.   - MILD pedal edema: Swelling limited to foot and ankle, pitting edema < 1/4 inch (6 mm) deep, rest and elevation eliminate most or all swelling.   - MODERATE edema: Swelling of lower leg to knee, pitting edema > 1/4 inch (6 mm) deep, rest and elevation only partially reduce swelling.   - SEVERE edema: Swelling extends above knee, facial or hand swelling present.      Knee to foot - moderate  4. REDNESS: "Does the swelling look red or infected?"     Redness, drainage that builds back up, denies heat 5. PAIN: "Is the swelling painful to touch?" If Yes, ask: "How painful is it?"   (Scale 1-10; mild, moderate or severe)     "It's not really painful, it's just the tightness of the swelling", pt states she is still able to walk  6. FEVER: "Do you have a fever?" If Yes, ask: "What is it, how was it measured, and when did it start?"      No 7. CAUSE: "What do you think is causing the leg swelling?"     Not sure 8. MEDICAL HISTORY: "Do you have a history of blood clots (e.g., DVT), cancer, heart failure, kidney disease, or liver failure?"  HTN 9. RECURRENT SYMPTOM: "Have you had leg swelling before?" If Yes, ask: "When was the last time?" "What happened that time?"     No 10. OTHER SYMPTOMS: "Do you have any other symptoms?" (e.g., chest pain, difficulty breathing)       "Has drained some fluid but builds back", redness where the blister is located, swelling from the knee to the foot, no skin temperature change, no numbness or tingling, no CP or SOB, no fever or chills  Protocols used: Leg Swelling and Edema-A-AH

## 2023-09-03 NOTE — Progress Notes (Signed)
   Acute Office Visit  Subjective:     Patient ID: Stacy Woods, female    DOB: 02/24/1966, 58 y.o.   MRN: [id number]  Chief Complaint  Patient presents with   Blister    Top of L foot popped up on Sunday she reports that it has drained 2x and her foot is swelling    HPI Patient is in today for blister on top of the left foot for a few days.  She does not member any injury or trauma.  No excess friction like a new pair of shoes.. Woke up with it.  Has drained a couple of times.  He has been a little bit more swollen in that foot and lower leg.  She has been under a lot of stress this week.  ROS      Objective:    BP 114/86   Pulse 80   Ht 5\' 4"  (1.626 m)   Wt 252 lb (114.3 kg)   SpO2 95%   BMI 43.26 kg/m    Physical Exam Skin:    Comments: He has trace pitting edema in the top of her foot up to her shin below her knee.  On the top of her foot just before her ankle she has an oval-shaped fluctuant blister approximately 1 x 2 cm.  Clear fluid.     No results found for any visits on 09/03/23.      Assessment & Plan:   Problem List Items Addressed This Visit   None Visit Diagnoses       Localized swelling of left lower extremity    -  Primary   Relevant Medications   furosemide (LASIX) 20 MG tablet     Vesicle of skin           Vesicle-I think it secondary to the excess swelling in that foot and lower leg unclear if there was trauma injury or friction.  Will use compression with Ace wrap for the next couple of days as well as give her furosemide to take for 2 to 3 days until the swelling improves limit fluids to no more than 50 ounces a day limit salt intake.  Will see if this will allow the wound to heal if not please let us know if she sees any spreading erythema or abnormal drainage to let us know sooner rather than later.  She does have a history of psoriasis and is on a biologic.   Meds ordered this encounter  Medications   furosemide (LASIX) 20 MG tablet     Sig: Take 1 tablet (20 mg total) by mouth daily as needed.    Dispense:  10 tablet    Refill:  0    No follow-ups on file.  Nani Gasser, MD

## 2023-09-07 ENCOUNTER — Encounter: Payer: Self-pay | Admitting: Family Medicine

## 2023-09-07 LAB — WOUND CULTURE: Organism ID, Bacteria: NONE SEEN

## 2023-09-07 NOTE — Progress Notes (Signed)
 Hi Stacy Woods, wound culture came back negative please let us know if it looks like it is healing hopefully the wrapping and compression over the weekend made a big difference.

## 2023-09-13 ENCOUNTER — Ambulatory Visit
Admission: EM | Admit: 2023-09-13 | Discharge: 2023-09-13 | Disposition: A | Discharge status: Home / Self Care | Attending: Family Medicine | Admitting: Family Medicine

## 2023-09-13 ENCOUNTER — Other Ambulatory Visit: Payer: Self-pay

## 2023-09-13 ENCOUNTER — Ambulatory Visit

## 2023-09-13 DIAGNOSIS — S6992XA Unspecified injury of left wrist, hand and finger(s), initial encounter: Secondary | ICD-10-CM

## 2023-09-13 DIAGNOSIS — S52572A Other intraarticular fracture of lower end of left radius, initial encounter for closed fracture: Secondary | ICD-10-CM | POA: Diagnosis not present

## 2023-09-13 MED ORDER — HYDROCODONE-ACETAMINOPHEN 5-325 MG PO TABS: 2.0000 | ORAL_TABLET | ORAL | 0 refills | Status: DC | PRN

## 2023-09-13 NOTE — Discharge Instructions (Signed)
 Follow-up with your orthopedist tomorrow Use the brace, ice and elevation, use the sling for support The x-ray reading we discussed is preliminary. Your x-ray will be read by a radiologist in next few hours. If there is a discrepancy, you will be contacted, and instructed on a new plan for you care.

## 2023-09-13 NOTE — ED Provider Notes (Signed)
 [facility]    CSN: [id number] Arrival date & time: 09/13/23  1522      History   Chief Complaint Chief Complaint  Patient presents with  . Wrist Pain    HPI Stacy Woods is a 58 y.o. female.    Wrist Pain Left wrist injury, fell in a parking lot when she tripped on a stone just prior to arrival.  Admits superficial scrape to left lower leg without bleeding.Complaining of pain swelling and limited range of motion left wrist.  She is right-handed.  Denies head injury, loss of consciousness, neck or back pain, elbow pain, paresthesias, weakness.  She has an orthopedist.  Patient with multiple drug allergies including NSAIDs, Naprosyn, prednisone, Mobic and Percocet.  States she is able to take Vicodin  Past Medical History:  Diagnosis Date  . AK (actinic keratosis) 07/14/2017   Right upper chest wall  . Anemia   . Anemia, iron deficiency 05/02/2015  . Family history of breast cancer 03/24/2016  . Fibromyalgia   . History of gastric bypass 05/09/2016  . Hypertension   . Insomnia 08/14/2017  . Malabsorption of iron 05/09/2016  . PONV (postoperative nausea and vomiting)   . Prediabetes 09/01/2013   March 2015   . Seizure (HCC) 2018   from low blood sugar   . Surgical menopause 03/24/2016    Patient Active Problem List   Diagnosis Date Noted  . Cubital tunnel syndrome on left 02/06/2023  . Bilateral lower extremity edema 09/26/2021  . Avulsion fracture of left wrist 09/24/2021  . Chondromalacia patellae, left knee 11/23/2020  . Osteoarthritis of left knee 11/23/2020  . Cyst of lateral meniscus of left knee 11/23/2020  . Morning joint stiffness 08/28/2019  . Constipation 08/28/2019  . Right shoulder pain 07/27/2019  . Glaucoma 07/21/2019  . Transaminitis 12/01/2017  . MDD (recurrent major depressive disorder) in remission (HCC) 11/03/2017  . AK (actinic keratosis) 07/14/2017  . Low magnesium level 01/12/2017  . B12 deficiency 01/01/2017  . Trochanteric  bursitis of left hip 10/08/2016  . Malabsorption of iron 05/09/2016  . Arthritis of knee, degenerative 04/14/2016  . Surgical menopause 03/24/2016  . Primary osteoarthritis of both knees 10/01/2015  . Hypoglycemia 06/12/2015  . Cubital tunnel syndrome on right 09/14/2014  . Degenerative disc disease, cervical 07/27/2014  . Fibromyalgia 04/11/2013  . Essential hypertension, benign 04/11/2013  . Anxiety state 04/11/2013  . Iron deficiency anemia 09/04/2011  . Palpitations 09/04/2011  . Fatigue 09/04/2011  . History of gastric bypass 09/03/2011  . Insomnia 08/16/2011  . Osteoarthrosis, unspecified whether generalized or localized, lower leg 08/16/2011  . Myalgia and myositis, unspecified 08/16/2011  . Acquired absence of genital organ 08/16/2011  . Irritable bowel syndrome 08/16/2011  . Seborrheic dermatitis 08/16/2011  . Mixed hyperlipidemia 08/16/2011  . Morbid obesity (HCC) 08/16/2011    Past Surgical History:  Procedure Laterality Date  . ABDOMINAL HYSTERECTOMY  06/10/1995  . APPENDECTOMY    . CESAREAN SECTION  [id number]  . CHOLECYSTECTOMY    . CHONDROPLASTY Left 11/23/2020   Procedure: CHONDROPLASTY MEDIAL FEMORAL PATELLA;  Surgeon: Jodi Geralds, MD;  Location: [facility];  Service: Orthopedics;  Laterality: Left;  Marland Kitchen GASTRIC BYPASS  06/09/2009  . KNEE ARTHROSCOPY Left 06/09/2000  . KNEE ARTHROSCOPY Left 10/2021  . KNEE ARTHROSCOPY WITH EXCISION BAKER'S CYST Left 11/23/2020   Procedure: KNEE ARTHROSCOPY WITH ARTHROSCOPIC CYST EXCISION;  Surgeon: Jodi Geralds, MD;  Location: [facility];  Service: Orthopedics;  Laterality: Left;  .  KNEE ARTHROSCOPY WITH LATERAL RELEASE Left 11/23/2020   Procedure: ARTHROSCOPY KNEE WITH LATERAL RETINACULAR RELEASE, PARTIAL LATERAL MENISCECTOMY;  Surgeon: Jodi Geralds, MD;  Location: [facility];  Service: Orthopedics;  Laterality: Left;  . TONSILLECTOMY  06/10/1975  . ULNAR NERVE TRANSPOSITION Right  06/12/2015   Procedure: RIGHT CUBITAL TUNNEL RELEASE;  Surgeon: Mack Hook, MD;  Location: [facility];  Service: Orthopedics;  Laterality: Right;  . ULNAR TUNNEL RELEASE Left 02/06/2023   Procedure: LEFT CUBITAL TUNNEL RELEASE;  Surgeon: Samuella Cota, MD;  Location: [facility];  Service: Orthopedics;  Laterality: Left;    OB History   No obstetric history on file.      Home Medications    Prior to Admission medications   Medication Sig Start Date End Date Taking? Authorizing Provider  HYDROcodone-acetaminophen (NORCO/VICODIN) 5-325 MG tablet Take 2 tablets by mouth every 4 (four) hours as needed. 09/13/23  Yes Meliton Rattan, PA  blood glucose meter kit and supplies Dispense based on patient and insurance preference. Use up to four times daily as directed. (FOR ICD-10 E10.9, E11.9). 08/31/19   Everrett Coombe, DO  cyclobenzaprine (FLEXERIL) 10 MG tablet TAKE 1 TABLET(10 MG) BY MOUTH THREE TIMES DAILY AS NEEDED FOR MUSCLE SPASMS 08/06/23   Christen Butter, NP  escitalopram (LEXAPRO) 10 MG tablet Take 1 tablet (10 mg total) by mouth daily. 10/08/22 10/08/23  Christen Butter, NP  furosemide (LASIX) 20 MG tablet Take 1 tablet (20 mg total) by mouth daily as needed. 09/03/23   Agapito Games, MD  glucose blood (FREESTYLE LITE) test strip Check glucose twice daily. 05/05/23   Christen Butter, NP  Lancets 30G MISC Check fasting blood sugar daily 02/06/17   Rodolph Bong, MD  metoprolol succinate (TOPROL-XL) 50 MG 24 hr tablet Take 1 tablet (50 mg total) by mouth daily. 10/08/22   Christen Butter, NP  Sodium Fluoride 1.1 % PSTE Use to brush teeth for 2 minutes in the evening as directed 12/14/20     TREMFYA 100 MG/ML pen Inject 100 mg into the skin every 8 (eight) weeks. 11/27/22   [provider]  zolpidem (AMBIEN) 5 MG tablet Take 1 tablet (5 mg total) by mouth at bedtime as needed. for sleep 04/10/23   Christen Butter, NP    Family History Family History  Problem Relation  Age of Onset  . Hypertension Mother   . Breast cancer Mother   . Diabetes Father   . Hypertension Father   . Stroke Father   . Heart failure Father   . CAD Father        has stents  . Migraines Sister   . Healthy Son   . Healthy Daughter   . Colon cancer Neg Hx   . Colon polyps Neg Hx   . Esophageal cancer Neg Hx   . Stomach cancer Neg Hx   . Rectal cancer Neg Hx     Social History Social History   Tobacco Use  . Smoking status: Never    Passive exposure: Never  . Smokeless tobacco: Never  Vaping Use  . Vaping status: Never Used  Substance Use Topics  . Alcohol use: No  . Drug use: No     Allergies   Lyrica [pregabalin], Percocet [oxycodone-acetaminophen], Mobic [meloxicam], Naproxen, Nsaids, Prednisone, and Shellfish allergy   Review of Systems Review of Systems   Physical Exam Triage Vital Signs ED Triage Vitals  Encounter Vitals Group     BP 09/13/23 1532 Marland Kitchen)  129/93     Systolic BP Percentile --      Diastolic BP Percentile --      Pulse --      Resp 09/13/23 1532 16     Temp 09/13/23 1532 97.9 F (36.6 C)     Temp src --      SpO2 09/13/23 1532 97 %     Weight --      Height --      Head Circumference --      Peak Flow --      Pain Score 09/13/23 1533 9     Pain Loc --      Pain Education --      Exclude from Growth Chart --    No data found.  Updated Vital Signs BP (!) 129/93 (BP Location: Right Arm)   Temp 97.9 F (36.6 C)   Resp 16   SpO2 97%   Visual Acuity Right Eye Distance:   Left Eye Distance:   Bilateral Distance:    Right Eye Near:   Left Eye Near:    Bilateral Near:     Physical Exam Vitals and nursing note reviewed.  Constitutional:      Appearance: She is obese. She is not ill-appearing.  HENT:     Head: Normocephalic and atraumatic.  Musculoskeletal:     Left elbow: Normal.     Left forearm: No swelling or tenderness.     Left wrist: Swelling (mild) and bony tenderness present. No deformity or lacerations.  Decreased range of motion. Normal pulse.     Left hand: Normal. Normal capillary refill. Normal pulse.  Skin:    General: Skin is warm and dry.  Neurological:     Mental Status: She is alert.     UC Treatments / Results  Labs (all labs ordered are listed, but only abnormal results are displayed) Labs Reviewed - No data to display  EKG   Radiology DG Wrist Complete Left Result Date: 09/13/2023 CLINICAL DATA:  Fall landing on left wrist with pain. EXAM: LEFT WRIST - COMPLETE 3+ VIEW COMPARISON:  None Available. FINDINGS: Osteopenia is noted. There is a fracture of the medial aspect of the radius with intra-articular extension and ventral displacement of the distal fracture fragment. The remaining bony structures appear intact and there is no dislocation. Soft tissue swelling is noted about the wrist and distal forearm. IMPRESSION: Mildly displaced fracture of the distal radius with intra-articular extension. Electronically Signed   By: Thornell Sartorius M.D.   On: 09/13/2023 15:51    Procedures Procedures (including critical care time)  Medications Ordered in UC Medications - No data to display  Initial Impression / Assessment and Plan / UC Course  I have reviewed the triage vital signs and the nursing notes.  Pertinent labs & imaging results that were available during my care of the patient were reviewed by me and considered in my medical decision making (see chart for details).     Left wrist x-ray independently viewed by me distal radius fracture, patient has an orthopedist she was counseled to call them first thing tomorrow to schedule a follow-up appointment as this may require a surgical repair, in the meantime we will wear brace, use sling, apply ice.  Rx pain medicine to her pharmacy Final Clinical Impressions(s) / UC Diagnoses   Final diagnoses:  Injury of left wrist, initial encounter  Other closed intra-articular fracture of distal end of left radius, initial encounter  Discharge Instructions      Follow-up with your orthopedist tomorrow Use the brace, ice and elevation, use the sling for support The x-ray reading we discussed is preliminary. Your x-ray will be read by a radiologist in next few hours. If there is a discrepancy, you will be contacted, and instructed on a new plan for you care.       ED Prescriptions     Medication Sig Dispense Auth. Provider   HYDROcodone-acetaminophen (NORCO/VICODIN) 5-325 MG tablet Take 2 tablets by mouth every 4 (four) hours as needed. 12 tablet Meliton Rattan, [place]      I have reviewed the PDMP during this encounter.   Meliton Rattan, [place] 09/13/23 1556

## 2023-09-13 NOTE — ED Triage Notes (Signed)
 Patient states she tripped and fell landing on her left wrist. C/O pain with movement. Patient states it occurred 30 minutes ago.

## 2023-09-15 ENCOUNTER — Ambulatory Visit: Admitting: Orthopedic Surgery

## 2023-10-08 ENCOUNTER — Encounter: Payer: Self-pay | Admitting: Medical-Surgical

## 2023-10-08 ENCOUNTER — Ambulatory Visit: Payer: Managed Care, Other (non HMO) | Admitting: Medical-Surgical

## 2023-10-08 VITALS — BP 117/80 | HR 86 | Resp 20 | Ht 64.0 in | Wt 248.1 lb

## 2023-10-08 DIAGNOSIS — E538 Deficiency of other specified B group vitamins: Secondary | ICD-10-CM | POA: Diagnosis not present

## 2023-10-08 DIAGNOSIS — I10 Essential (primary) hypertension: Secondary | ICD-10-CM

## 2023-10-08 DIAGNOSIS — F411 Generalized anxiety disorder: Secondary | ICD-10-CM

## 2023-10-08 DIAGNOSIS — E782 Mixed hyperlipidemia: Secondary | ICD-10-CM

## 2023-10-08 DIAGNOSIS — M797 Fibromyalgia: Secondary | ICD-10-CM | POA: Diagnosis not present

## 2023-10-08 DIAGNOSIS — R7303 Prediabetes: Secondary | ICD-10-CM

## 2023-10-08 DIAGNOSIS — G4700 Insomnia, unspecified: Secondary | ICD-10-CM

## 2023-10-08 DIAGNOSIS — D509 Iron deficiency anemia, unspecified: Secondary | ICD-10-CM

## 2023-10-08 MED ORDER — METOPROLOL SUCCINATE ER 50 MG PO TB24: 50.0000 mg | ORAL_TABLET | Freq: Every day | ORAL | 3 refills | Status: AC

## 2023-10-08 MED ORDER — ESCITALOPRAM OXALATE 10 MG PO TABS: 10.0000 mg | ORAL_TABLET | Freq: Every day | ORAL | 3 refills | Status: AC

## 2023-10-08 MED ORDER — CYCLOBENZAPRINE HCL 10 MG PO TABS: ORAL_TABLET | ORAL | 5 refills | Status: DC

## 2023-10-08 MED ORDER — ZOLPIDEM TARTRATE 5 MG PO TABS: 5.0000 mg | ORAL_TABLET | Freq: Every evening | ORAL | 1 refills | Status: DC | PRN

## 2023-10-08 NOTE — Progress Notes (Signed)
        Established patient visit  History, exam, impression, and plan:  1. Essential hypertension, benign (Primary) Stacy Woods 58 year old female presenting today for follow-up on essential hypertension.  She has been taking Toprol -XL 50 mg daily, tolerating well without side effects.  Not regularly checking blood pressures.  Physically active as tolerated. Denies CP, SOB, palpitations, lower extremity edema, dizziness, headaches, or vision changes.  Cardiopulmonary exam is normal today.  Blood pressure at goal.  Continue Toprol -XL 50 mg daily.  Checking labs as below. - CBC - CMP14+EGFR - Lipid panel - metoprolol  succinate (TOPROL -XL) 50 MG 24 hr tablet; Take 1 tablet (50 mg total) by mouth daily.  Dispense: 90 tablet; Refill: 3  2. Anxiety state Currently taking Lexapro  10 mg daily, tolerating well without side effects.  Feels the medication is working well and does not desire any dose changes.  Denies SI/HI.  Mood, affect, thought pattern, cognition, and speech all normal today.  Continue Lexapro  as prescribed. - escitalopram  (LEXAPRO ) 10 MG tablet; Take 1 tablet (10 mg total) by mouth daily.  Dispense: 90 tablet; Refill: 3  3. Fibromyalgia History of fibromyalgia and working on exercise, diet, and weight loss for conservative measures.  When she has flares, she takes Flexeril  3 times daily as needed.  This seems to help and she is able to get back to normal function.  No excessive drowsiness or concerning side effects with the medication.  Refilling today. - cyclobenzaprine  (FLEXERIL ) 10 MG tablet; TAKE 1 TABLET(10 MG) BY MOUTH THREE TIMES DAILY AS NEEDED FOR MUSCLE SPASMS  Dispense: 90 tablet; Refill: 5  4. B12 deficiency Checking vitamin B12. - Vitamin B12  5. Mixed hyperlipidemia Not currently taking any medication but is working on diet, exercise, and weight loss.  Checking labs today. - CMP14+EGFR - Lipid panel  6. Morbid obesity (HCC) Working on weight loss with diet and  exercise. Has lost several pounds since her last visit here.   7. Iron  deficiency anemia, unspecified iron  deficiency anemia type Checking iron . - Iron , TIBC and Ferritin Panel  8. Prediabetes Checking A1c. - Hemoglobin A1c  9. Insomnia, unspecified type Long-term treatment with Ambien  5 mg nightly as needed.  Notes that she does take this every night because without that she is unable to calm her mind and get to sleep.  No excessive grogginess or other side effects.  Tolerating well overall and the medication is effective.  Continue Ambien  5 mg nightly. - zolpidem  (AMBIEN ) 5 MG tablet; Take 1 tablet (5 mg total) by mouth at bedtime as needed. for sleep  Dispense: 90 tablet; Refill: 1  Procedures performed this visit: None.  Return in about 6 months (around 04/09/2024) for chronic disease follow up.  __________________________________ Maryl Snook, DNP, APRN, FNP-BC Primary Care and Sports Medicine [facility] [place]

## 2023-10-09 ENCOUNTER — Encounter: Payer: Self-pay | Admitting: Medical-Surgical

## 2023-10-09 LAB — VITAMIN B12: Vitamin B-12: 172 pg/mL — ABNORMAL LOW (ref 232–1245)

## 2023-10-09 LAB — CMP14+EGFR
ALT: 31 IU/L (ref 0–32)
AST: 50 IU/L — ABNORMAL HIGH (ref 0–40)
Albumin: 4.2 g/dL (ref 3.8–4.9)
Alkaline Phosphatase: 134 IU/L — ABNORMAL HIGH (ref 44–121)
BUN/Creatinine Ratio: 8 — ABNORMAL LOW (ref 9–23)
BUN: 9 mg/dL (ref 6–24)
Bilirubin Total: 0.5 mg/dL (ref 0.0–1.2)
CO2: 19 mmol/L — ABNORMAL LOW (ref 20–29)
Calcium: 9.3 mg/dL (ref 8.7–10.2)
Chloride: 107 mmol/L — ABNORMAL HIGH (ref 96–106)
Creatinine, Ser: 1.13 mg/dL — ABNORMAL HIGH (ref 0.57–1.00)
Globulin, Total: 2.5 g/dL (ref 1.5–4.5)
Glucose: 96 mg/dL (ref 70–99)
Potassium: 4.3 mmol/L (ref 3.5–5.2)
Sodium: 140 mmol/L (ref 134–144)
Total Protein: 6.7 g/dL (ref 6.0–8.5)
eGFR: 57 mL/min/{1.73_m2} — ABNORMAL LOW (ref >59)

## 2023-10-09 LAB — CBC
Hematocrit: 42.2 % (ref 34.0–46.6)
Hemoglobin: 14 g/dL (ref 11.1–15.9)
MCH: 30.4 pg (ref 26.6–33.0)
MCHC: 33.2 g/dL (ref 31.5–35.7)
MCV: 92 fL (ref 79–97)
Platelets: 390 10*3/uL (ref 150–450)
RBC: 4.6 x10E6/uL (ref 3.77–5.28)
RDW: 13.9 % (ref 11.7–15.4)
WBC: 5 10*3/uL (ref 3.4–10.8)

## 2023-10-09 LAB — LIPID PANEL
Chol/HDL Ratio: 7.3 ratio — ABNORMAL HIGH (ref 0.0–4.4)
Cholesterol, Total: 218 mg/dL — ABNORMAL HIGH (ref 100–199)
HDL: 30 mg/dL — ABNORMAL LOW (ref >39)
LDL Chol Calc (NIH): 154 mg/dL — ABNORMAL HIGH (ref 0–99)
Triglycerides: 182 mg/dL — ABNORMAL HIGH (ref 0–149)
VLDL Cholesterol Cal: 34 mg/dL (ref 5–40)

## 2023-10-09 LAB — IRON,TIBC AND FERRITIN PANEL
Ferritin: 28 ng/mL (ref 15–150)
Iron Saturation: 18 % (ref 15–55)
Iron: 69 ug/dL (ref 27–159)
Total Iron Binding Capacity: 375 ug/dL (ref 250–450)
UIBC: 306 ug/dL (ref 131–425)

## 2023-10-09 LAB — HEMOGLOBIN A1C
Est. average glucose Bld gHb Est-mCnc: 123 mg/dL
Hgb A1c MFr Bld: 5.9 % — ABNORMAL HIGH (ref 4.8–5.6)

## 2023-12-22 ENCOUNTER — Encounter: Payer: Self-pay | Admitting: Medical-Surgical

## 2023-12-22 ENCOUNTER — Ambulatory Visit: Admitting: Medical-Surgical

## 2023-12-22 VITALS — BP 125/85 | HR 77 | Resp 20 | Ht 64.0 in | Wt 247.0 lb

## 2023-12-22 DIAGNOSIS — M5442 Lumbago with sciatica, left side: Secondary | ICD-10-CM | POA: Diagnosis not present

## 2023-12-22 DIAGNOSIS — M21379 Foot drop, unspecified foot: Secondary | ICD-10-CM

## 2023-12-22 NOTE — Progress Notes (Signed)
 Established patient visit  Discussed the use of AI scribe software for clinical note transcription with the patient, who gave verbal consent to proceed.  History of Present Illness   The patient presents with left low back and hip pain with associated numbness and weakness.  Left low back and hip pain - Sharp, stabbing pain at the top of the left hip, radiating towards the back and sometimes down the left side - Aching pain between episodes of sharp pain - Pain is persistent and impacts mobility and daily activities - No relief with Flexeril , Advil , Tylenol , ice, or heat - Cannot tolerate meloxicam due to swelling - Avoids Lyrica  and NSAIDs such as naproxen  and Advil  due to side effects  Paresthesia and weakness - Numbness and tingling down the lateral thigh - No numbness or tingling in the saddle area - New onset difficulty walking due to leg weakness, particularly with foot dorsiflexion  Recent trauma and injury history - No recent falls or injuries since breaking their arm earlier in the year        Physical Exam Vitals reviewed.  Constitutional:      General: She is not in acute distress.    Appearance: Normal appearance.  HENT:     Head: Normocephalic and atraumatic.  Cardiovascular:     Rate and Rhythm: Normal rate and regular rhythm.     Pulses: Normal pulses.     Heart sounds: Normal heart sounds. No murmur heard.    No friction rub. No gallop.  Pulmonary:     Effort: Pulmonary effort is normal. No respiratory distress.     Breath sounds: Normal breath sounds. No wheezing.  Musculoskeletal:     Lumbar back: Tenderness present.       Back:     Comments: Red area of indication on diagram indicates the maximal point of tenderness with radiation towards the lateral left and lower to the hip.  Skin:    General: Skin is warm and dry.  Neurological:     Mental Status: She is alert and oriented to person, place, and time.     Motor: Weakness present.      Deep Tendon Reflexes: Right Babinski's sign: JLJ.     Comments: Objective weakness of the left lower extremity with dorsiflexion of the foot, flexion and extension of the left knee, and flexion of the left hip.  Muscle strength in the left lower extremity 3-/5.  Right lower extremity sensation and muscle strength intact without deficit.  Psychiatric:        Mood and Affect: Mood normal.        Behavior: Behavior normal.        Thought Content: Thought content normal.        Judgment: Judgment normal.     Assessment and Plan    Low back pain with possible cauda equina syndrome Acute left low back and hip pain with tingling, numbness, and leg weakness, raising concern for cauda equina syndrome. Differential includes nerve impingement or cauda equina syndrome, requiring MRI for confirmation. - Order MRI of the lumbar spine to evaluate for cauda equina syndrome. - If MRI negative for cauda equina, initiate prednisone  burst followed by two weeks of NSAIDs (avoid meloxicam). - Consider short-term tramadol  or Norco for pain management if not surgical. - Discuss potential referral to physical therapy if not surgical, avoiding dry needling.  Facet arthropathy Chronic low back pain due to facet arthropathy, managed conservatively. - Continue conservative  management unless acute changes occur.      Return if symptoms worsen or fail to improve.  __________________________________ Zada FREDRIK Palin, DNP, APRN, FNP-BC Primary Care and Sports Medicine [facility] [place]

## 2023-12-27 ENCOUNTER — Ambulatory Visit

## 2023-12-27 DIAGNOSIS — M5442 Lumbago with sciatica, left side: Secondary | ICD-10-CM | POA: Diagnosis not present

## 2023-12-27 DIAGNOSIS — M21379 Foot drop, unspecified foot: Secondary | ICD-10-CM | POA: Diagnosis not present

## 2023-12-28 ENCOUNTER — Ambulatory Visit: Payer: Self-pay | Admitting: Medical-Surgical

## 2023-12-28 DIAGNOSIS — M5442 Lumbago with sciatica, left side: Secondary | ICD-10-CM

## 2023-12-28 MED ORDER — METHYLPREDNISOLONE 4 MG PO TBPK
ORAL_TABLET | ORAL | 0 refills | Status: DC
Start: 2023-12-28 — End: 2024-04-11

## 2024-02-26 ENCOUNTER — Ambulatory Visit (INDEPENDENT_AMBULATORY_CARE_PROVIDER_SITE_OTHER)

## 2024-02-26 VITALS — BP 121/86 | HR 81 | Temp 98.8°F

## 2024-02-26 DIAGNOSIS — Z23 Encounter for immunization: Secondary | ICD-10-CM

## 2024-02-26 NOTE — Progress Notes (Signed)
 Patient is in office today for a nurse visit for flu Immunization. Patient flu vaccine Injection was given in the  Left deltoid. Patient tolerated injection well.

## 2024-03-23 ENCOUNTER — Other Ambulatory Visit: Payer: Self-pay | Admitting: Medical-Surgical

## 2024-03-23 DIAGNOSIS — M797 Fibromyalgia: Secondary | ICD-10-CM

## 2024-03-28 ENCOUNTER — Ambulatory Visit: Admitting: Medical-Surgical

## 2024-04-09 ENCOUNTER — Other Ambulatory Visit: Payer: Self-pay | Admitting: Medical-Surgical

## 2024-04-09 DIAGNOSIS — G4700 Insomnia, unspecified: Secondary | ICD-10-CM

## 2024-04-11 ENCOUNTER — Encounter: Payer: Self-pay | Admitting: Medical-Surgical

## 2024-04-11 ENCOUNTER — Encounter: Payer: Self-pay | Admitting: Radiology

## 2024-04-11 ENCOUNTER — Ambulatory Visit: Admitting: Medical-Surgical

## 2024-04-11 VITALS — BP 114/80 | HR 69 | Resp 20 | Ht 64.0 in | Wt 248.3 lb

## 2024-04-11 DIAGNOSIS — Z23 Encounter for immunization: Secondary | ICD-10-CM | POA: Diagnosis not present

## 2024-04-11 DIAGNOSIS — F411 Generalized anxiety disorder: Secondary | ICD-10-CM | POA: Diagnosis not present

## 2024-04-11 DIAGNOSIS — I10 Essential (primary) hypertension: Secondary | ICD-10-CM | POA: Diagnosis not present

## 2024-04-11 DIAGNOSIS — M797 Fibromyalgia: Secondary | ICD-10-CM

## 2024-04-11 DIAGNOSIS — G4700 Insomnia, unspecified: Secondary | ICD-10-CM

## 2024-04-11 MED ORDER — ZOLPIDEM TARTRATE 5 MG PO TABS: 5.0000 mg | ORAL_TABLET | Freq: Every evening | ORAL | 1 refills | Status: AC | PRN

## 2024-04-11 MED ORDER — CYCLOBENZAPRINE HCL 10 MG PO TABS: ORAL_TABLET | ORAL | 2 refills | Status: DC

## 2024-04-11 MED ORDER — ZOLPIDEM TARTRATE 5 MG PO TABS
5.0000 mg | ORAL_TABLET | Freq: Every evening | ORAL | 1 refills | Status: DC | PRN
Start: 2024-04-11 — End: 2024-04-11

## 2024-04-11 MED ORDER — CYCLOBENZAPRINE HCL 10 MG PO TABS: ORAL_TABLET | ORAL | 2 refills | Status: AC

## 2024-04-11 NOTE — Progress Notes (Signed)
 Established patient visit  History, exam, impression, and plan:  1. Insomnia, unspecified type Very pleasant 58 year old female presenting today with a history of insomnia.  Has been taking Ambien  5 mg nightly long-term with good results.  No side effects or intolerances.  No excessive grogginess in the mornings.  Was off the medication for a period of time while her family member was in the hospital and notes that without the medication, she was unable to fall asleep at all.  Continue Ambien  5 mg nightly as needed. - zolpidem  (AMBIEN ) 5 MG tablet; Take 1 tablet (5 mg total) by mouth at bedtime as needed. for sleep  Dispense: 90 tablet; Refill: 1  2. Fibromyalgia Reports that with the weather change and time change, her fibromyalgia symptoms have been worsening.  Most recently has been experiencing a pins and needle sensation in her bilateral thighs as well as generalized bodyaches and pains.  Using Flexeril  10 mg up to 3 times daily as needed however reports that she most often takes it at least twice daily.  Feels that the medication is helpful and seems to be the only thing that provides benefit.  Tolerates the medication well without excessive grogginess or other side effects.  Continue Flexeril  3 times daily as needed. - cyclobenzaprine  (FLEXERIL ) 10 MG tablet; TAKE 1 TABLET(10 MG) BY MOUTH THREE TIMES DAILY AS NEEDED FOR MUSCLE SPASMS  Dispense: 90 tablet; Refill: 2  3.  Essential hypertension Currently taking Toprol -XL 50 mg daily for blood pressure control and treatment of palpitations.  Compliant with dosing and reports the medication is well-tolerated and effective.  Blood pressure 114/80 today in office showing excellent control.  No recent concern with palpitations.  No other concerning symptoms.  Continue Toprol  XL 50 mg daily.  4.  Anxiety state Has been taking Lexapro  10 mg daily, tolerating well without side effects.  Feels the medication is effective and her mood is stable.   Not having significant anxiety symptoms.  Denies SI/HI.  Continue Lexapro  10 mg daily as prescribed.  5.Need for pneumococcal 20-valent conjugate vaccination (Primary) Prevnar 20 given in office today. - Pneumococcal conjugate vaccine 20-valent (Prevnar 20)   Physical Exam Vitals reviewed.  Constitutional:      General: She is not in acute distress.    Appearance: Normal appearance. She is obese. She is not ill-appearing.  HENT:     Head: Normocephalic and atraumatic.  Cardiovascular:     Rate and Rhythm: Normal rate and regular rhythm.     Pulses: Normal pulses.     Heart sounds: Normal heart sounds. No murmur heard.    No friction rub. No gallop.  Pulmonary:     Effort: Pulmonary effort is normal. No respiratory distress.     Breath sounds: Normal breath sounds. No wheezing.  Skin:    General: Skin is warm and dry.  Neurological:     Mental Status: She is alert and oriented to person, place, and time.  Psychiatric:        Mood and Affect: Mood normal.        Behavior: Behavior normal.        Thought Content: Thought content normal.        Judgment: Judgment normal.    Procedures performed this visit: None.  Return in about 6 months (around 10/09/2024) for annual physical exam or sooner if needed.  __________________________________ Zada FREDRIK Palin, DNP, APRN, FNP-BC Primary Care and Sports Medicine [facility] [place]

## 2024-05-20 ENCOUNTER — Encounter: Payer: Self-pay | Admitting: Medical-Surgical

## 2024-05-20 ENCOUNTER — Ambulatory Visit: Admitting: Medical-Surgical

## 2024-05-20 VITALS — BP 104/73 | HR 72 | Resp 20 | Ht 64.0 in | Wt 235.0 lb

## 2024-05-20 DIAGNOSIS — M5412 Radiculopathy, cervical region: Secondary | ICD-10-CM | POA: Diagnosis not present

## 2024-05-20 MED ORDER — TIZANIDINE HCL 4 MG PO TABS: 4.0000 mg | ORAL_TABLET | Freq: Three times a day (TID) | ORAL | 2 refills | Status: AC | PRN

## 2024-05-20 MED ORDER — METHYLPREDNISOLONE ACETATE 80 MG/ML IJ SUSP
80.0000 mg | Freq: Once | INTRAMUSCULAR | Status: AC
  Administered 2024-05-20: 80 mg via INTRAMUSCULAR

## 2024-05-20 NOTE — Addendum Note (Signed)
 Addended by: FANNY NIELS CROME on: 05/20/2024 04:35 PM   Modules accepted: Orders

## 2024-05-20 NOTE — Progress Notes (Signed)
° °       Established patient visit   History of Present Illness   Discussed the use of AI scribe software for clinical note transcription with the patient, who gave verbal consent to proceed.  History of Present Illness   Stacy Woods is a 58 year old female who presents with right shoulder pain and numbness.  Right shoulder pain - Burning and stabbing pain in the right shoulder/base of the neck - Pain began intermittently at work and has progressed over 6 to 8 weeks to constant daily pain - Radiates to the trapezius and along the collarbone - Pain worsens with certain movements, especially pulling her head down, which feels like tearing the muscle off - No recent falls or injuries - Numbness and tingling from around the elbow into the fingers - No loss of grip strength - No subjective weakness - Takes Flexeril  and occasional ibuprofen  for pain - Current medications are not providing relief     Physical Exam   Physical Exam Vitals reviewed.  Constitutional:      General: She is not in acute distress.    Appearance: Normal appearance. She is not ill-appearing.  HENT:     Head: Normocephalic and atraumatic.  Cardiovascular:     Rate and Rhythm: Normal rate and regular rhythm.     Pulses: Normal pulses.     Heart sounds: Normal heart sounds. No murmur heard.    No friction rub. No gallop.  Pulmonary:     Effort: Pulmonary effort is normal. No respiratory distress.     Breath sounds: Normal breath sounds. No wheezing.  Skin:    General: Skin is warm and dry.  Neurological:     Mental Status: She is alert and oriented to person, place, and time.  Psychiatric:        Mood and Affect: Mood normal.        Behavior: Behavior normal.        Thought Content: Thought content normal.        Judgment: Judgment normal.    Assessment & Plan   Cervical radiculitis Right sided cervical radiculopathy with burning, stabbing pain from the base of the neck to the right shoulder to  fingers, numbness, and tingling. Symptoms persist 6-8 weeks, exacerbated by movement. Flexeril  ineffective. Avoids steroids due to weight loss concerns. - Prescribed tizanidine  4mg  every 8 hours prn as alternative to Flexeril . - Recommended Aleve  (naproxen ) one tablet twice daily. - Provided home exercises to alleviate symptoms. - Administered Depo-Medrol  80mg  injection. - Advised follow-up if symptoms do not improve in six weeks.   Follow up   Return for neck/shoulder pain if not better in 6 weeks. __________________________________ Zada FREDRIK Palin, DNP, APRN, FNP-BC Primary Care and Sports Medicine [facility] [place]

## 2024-06-03 ENCOUNTER — Ambulatory Visit: Payer: Self-pay

## 2024-06-03 ENCOUNTER — Ambulatory Visit (INDEPENDENT_AMBULATORY_CARE_PROVIDER_SITE_OTHER)

## 2024-06-03 ENCOUNTER — Ambulatory Visit
Admission: EM | Admit: 2024-06-03 | Discharge: 2024-06-03 | Disposition: A | Discharge status: Home / Self Care | Attending: Internal Medicine | Admitting: Internal Medicine

## 2024-06-03 ENCOUNTER — Ambulatory Visit: Payer: Self-pay | Admitting: Internal Medicine

## 2024-06-03 DIAGNOSIS — M5441 Lumbago with sciatica, right side: Secondary | ICD-10-CM | POA: Diagnosis not present

## 2024-06-03 MED ORDER — HYDROCODONE-ACETAMINOPHEN 5-325 MG PO TABS: 1.0000 | ORAL_TABLET | Freq: Three times a day (TID) | ORAL | 0 refills | Status: AC | PRN

## 2024-06-03 MED ORDER — TRIAMCINOLONE ACETONIDE 40 MG/ML IJ SUSP
40.0000 mg | Freq: Once | INTRAMUSCULAR | Status: AC
  Administered 2024-06-03: 40 mg via INTRAMUSCULAR

## 2024-06-03 NOTE — ED Provider Notes (Signed)
 " [facility]  Note:  This document was prepared using Dragon voice recognition software and may include unintentional dictation errors.  MRN: [id number] DOB: 1965-10-30 DATE: 06/03/2024   Subjective:  Chief Complaint:  Chief Complaint  Patient presents with   Back Pain    HPI: Stacy Woods is a 58 y.o. female presenting for low back pain for less than one day.  Patient states she was vacuuming earlier today.  She states she removed the chairs from under her counter and when she went to push them back she heard a pop in her lower back.  She states she immediately felt her lower back spasm.  She states the pain took her breath away and she had to stay in a flexed position.  She reports taking two Advil  with no improvement an hour ago.  Reports pain in the midline of her lower back with radiation down her right buttocks.  Pain worse with sitting at the moment.  Patient does have a history of degenerative disc disease and fibromyalgia.  Patient reports no prior operations to her spine.  Denies fever, nausea/vomiting, abdominal pain, numbness/tingling, urinary/bowel incontinence, saddle paresthesia. Endorses low back pain with right-sided sciatica. Presents NAD.  Prior to Admission medications  Medication Sig Start Date End Date Taking? Authorizing Provider  blood glucose meter kit and supplies Dispense based on patient and insurance preference. Use up to four times daily as directed. (FOR ICD-10 E10.9, E11.9). 08/31/19   Alvia Bring, DO  cyclobenzaprine  (FLEXERIL ) 10 MG tablet TAKE 1 TABLET(10 MG) BY MOUTH THREE TIMES DAILY AS NEEDED FOR MUSCLE SPASMS 04/11/24   Willo Mini, NP  escitalopram  (LEXAPRO ) 10 MG tablet Take 1 tablet (10 mg total) by mouth daily. 10/08/23 10/07/24  Willo Mini, NP  glucose blood (FREESTYLE LITE) test strip Check glucose twice daily. 05/05/23   Willo Mini, NP  Lancets 30G MISC Check fasting blood sugar daily 02/06/17   Corey, Evan S, MD  metoprolol  succinate  (TOPROL -XL) 50 MG 24 hr tablet Take 1 tablet (50 mg total) by mouth daily. 10/08/23   Willo Mini, NP  SKYRIZI PEN 150 MG/ML pen Inject 150 mg into the skin. 10/05/23   [provider]  Sodium Fluoride  1.1 % PSTE Use to brush teeth for 2 minutes in the evening as directed 12/14/20     tiZANidine  (ZANAFLEX ) 4 MG tablet Take 1 tablet (4 mg total) by mouth every 8 (eight) hours as needed for muscle spasms. Do not take with Flexeril . 05/20/24   Willo Mini, NP  zolpidem  (AMBIEN ) 5 MG tablet Take 1 tablet (5 mg total) by mouth at bedtime as needed. for sleep 04/11/24   Willo Mini, NP     Allergies[1]  History:   Past Medical History:  Diagnosis Date   AK (actinic keratosis) 07/14/2017   Right upper chest wall   Allergy spring annually   Anemia    Anemia, iron  deficiency 05/02/2015   Arthritis    Family history of breast cancer 03/24/2016   Fibromyalgia    History of gastric bypass 05/09/2016   Hypertension    Insomnia 08/14/2017   Malabsorption of iron  05/09/2016   PONV (postoperative nausea and vomiting)    Prediabetes 09/01/2013   March 2015    Seizure (HCC) 2018   from low blood sugar    Surgical menopause 03/24/2016     Past Surgical History:  Procedure Laterality Date   ABDOMINAL HYSTERECTOMY  06/10/1995   APPENDECTOMY     CESAREAN SECTION  8008,8005   CHOLECYSTECTOMY     CHONDROPLASTY Left 11/23/2020   Procedure: CHONDROPLASTY MEDIAL FEMORAL PATELLA;  Surgeon: Yvone Rush, MD;  Location: [facility];  Service: Orthopedics;  Laterality: Left;   GASTRIC BYPASS  06/09/2009   KNEE ARTHROSCOPY Left 06/09/2000   KNEE ARTHROSCOPY Left 10/2021   KNEE ARTHROSCOPY WITH EXCISION BAKER'S CYST Left 11/23/2020   Procedure: KNEE ARTHROSCOPY WITH ARTHROSCOPIC CYST EXCISION;  Surgeon: Yvone Rush, MD;  Location: [facility];  Service: Orthopedics;  Laterality: Left;   KNEE ARTHROSCOPY WITH LATERAL RELEASE Left 11/23/2020   Procedure: ARTHROSCOPY KNEE  WITH LATERAL RETINACULAR RELEASE, PARTIAL LATERAL MENISCECTOMY;  Surgeon: Yvone Rush, MD;  Location: [facility];  Service: Orthopedics;  Laterality: Left;   TONSILLECTOMY  06/10/1975   ULNAR NERVE TRANSPOSITION Right 06/12/2015   Procedure: RIGHT CUBITAL TUNNEL RELEASE;  Surgeon: Alm Hummer, MD;  Location: [facility];  Service: Orthopedics;  Laterality: Right;   ULNAR TUNNEL RELEASE Left 02/06/2023   Procedure: LEFT CUBITAL TUNNEL RELEASE;  Surgeon: Arlinda Buster, MD;  Location: [facility];  Service: Orthopedics;  Laterality: Left;    Family History  Problem Relation Age of Onset   Hypertension Mother    Breast cancer Mother    Arthritis Mother    Cancer Mother    Diabetes Father    Hypertension Father    Stroke Father    Heart failure Father    CAD Father        has stents   Heart disease Father    Migraines Sister    Healthy Son    Healthy Daughter    Colon cancer Neg Hx    Colon polyps Neg Hx    Esophageal cancer Neg Hx    Stomach cancer Neg Hx    Rectal cancer Neg Hx     Social History[2]  Review of Systems  Constitutional:  Negative for fever.  Gastrointestinal:  Negative for abdominal pain, nausea and vomiting.  Musculoskeletal:  Positive for back pain and myalgias.  Neurological:  Negative for numbness.     Objective:   Vitals: BP 114/81 (BP Location: Right Arm)   Pulse 78   Temp 98.2 F (36.8 C) (Oral)   Resp 20   SpO2 95%   Physical Exam Constitutional:      General: She is not in acute distress.    Appearance: Normal appearance. She is well-developed. She is morbidly obese. She is not ill-appearing or toxic-appearing.  HENT:     Head: Normocephalic and atraumatic.  Cardiovascular:     Rate and Rhythm: Normal rate and regular rhythm.     Heart sounds: Normal heart sounds.  Pulmonary:     Effort: Pulmonary effort is normal.     Breath sounds: Normal breath sounds.     Comments: Clear to  auscultation bilaterally  Abdominal:     General: Bowel sounds are normal.     Palpations: Abdomen is soft.     Tenderness: There is no abdominal tenderness.  Musculoskeletal:     Lumbar back: Tenderness present. Decreased range of motion.     Comments: TTP at midline of spine of low back. Decreased ROM due to pain with sitting and flexion. NV intact.  Skin:    General: Skin is warm and dry.  Neurological:     General: No focal deficit present.     Mental Status: She is alert.  Psychiatric:        Mood and Affect:  Mood and affect normal.     Results:  Labs: No results found for this or any previous visit (from the past 24 hours).  Radiology: No results found.   UC Course/Treatments:  Procedures: Procedures   Medications Ordered in UC: Medications  triamcinolone  acetonide (KENALOG -40) injection 40 mg (has no administration in time range)     Assessment and Plan :     ICD-10-CM   1. Acute midline low back pain with right-sided sciatica  M54.41 DG Lumbar Spine 2-3 Views    DG Lumbar Spine 2-3 Views      Acute midline low back pain with right-sided sciatica Afebrile, nontoxic-appearing, NAD. VSS. DDX includes but not limited to: fracture, contusion, dislocation, strain Imaging was negative for fracture. Kenalog  40mg  IM was given in office for pain. Patient reports being unable to take oral Prednisone , but has no issues with steroid injections. Will discharge patient with a short course of opiates given patient's allergies. Patient reports being able to take hydrocodone  without issue. Discussed risks. Advised patient not to mix with other products containing acetaminophen , not to combine with alcohol or other illicit drugs, not to drive or operate machinery, and to refrain from any activity that will require complete attention while taking this medication. PMP unremarkable. Patient instructed to avoid taking with ambien  and muscle relaxers. Recommend follow up with PCP for  recheck. Strict ED precautions were given and patient verbalized understanding.   ED Discharge Orders          Ordered    HYDROcodone -acetaminophen  (NORCO/VICODIN) 5-325 MG tablet  Every 8 hours PRN        06/03/24 1334             I have reviewed the PDMP during this encounter.      [1]  Allergies Allergen Reactions   Lyrica  [Pregabalin ] Other (See Comments)    zombie   Percocet [Oxycodone -Acetaminophen ] Hives and Itching    'I itched for two day and had to take Bendadyl'    Mobic [Meloxicam] Swelling   Naproxen  Swelling        Nsaids Swelling        Prednisone  Other (See Comments)    Swelling, blood sugar problems   Shellfish Allergy Nausea Only and Swelling  [2]  Social History Tobacco Use   Smoking status: Never    Passive exposure: Never   Smokeless tobacco: Never  Vaping Use   Vaping status: Never Used  Substance Use Topics   Alcohol use: No   Drug use: No     Denya Buckingham P, PA-C 06/03/24 1335  "

## 2024-06-03 NOTE — Telephone Encounter (Signed)
 FYI Only or Action Required?: FYI only for provider: Heading to UC per pt preference.  Patient was last seen in primary care on 05/20/2024 by Willo Mini, NP.  Called Nurse Triage reporting Back Injury.  Symptoms began today.  Interventions attempted: Rest, hydration, or home remedies.  Symptoms are: sudden and severe.  Triage Disposition: See HCP Within 4 Hours (Or PCP Triage)  Patient/caregiver understands and will follow disposition?: Yes      Copied from CRM #[id number]. Topic: Clinical - Red Word Triage >> Jun 03, 2024 11:31 AM Antony RAMAN wrote: Red Word that prompted transfer to Nurse Triage: was vacuuming and bent over and heard her back pop and now in a lot of pain Reason for Disposition  [1] SEVERE back pain (e.g., excruciating, unable to do any normal activities) AND [2] not improved 2 hours after pain medicine  Answer Assessment - Initial Assessment Questions This RN recommended pt be examined asap for symptoms, no PCP availability, pt preferring to go to UC than other office in region per proximity. Advised ED if any numbness or weakness emerge.   Was vacuuming bent over and heard her back pop, now lot of pain Pain in lower back down towards buttocks 7-8/10 pain now No numbness or weakness, no chest or abdominal pain Had to stand for several minutes to deal with pain  Protocols used: Back Pain-A-AH

## 2024-06-03 NOTE — Discharge Instructions (Addendum)
 Your x-rays showed no broken bones or dislocations. They were sent to a radiologist for further evaluation and we will call you if the radiologist sees anything different.   Recommend rest and ice.   I also prescribed you pain medicine. By [place] law, I can only prescribe a small amount. If more is required, you will need to see your PCP or orthopedics. Do not mix with other products containing acetaminophen , do not combine with alcohol or other illicit drugs, do not drive or operate machinery, and refrain from any activity that will require complete attention while taking this medication. Avoid taking with muscle relaxers and ambien .  You were given an injection (Kenalog ) for your pain today. This will help with the pain and inflammation.   It is very important for you to pay attention to any new symptoms or worsening of your current condition. Please go directly to the Emergency Department immediately should you begin to feel worse in any way or have any of the following symptoms: fevers, increased pain, increased swelling, increased redness, or color changes.

## 2024-06-03 NOTE — ED Triage Notes (Signed)
 Patient reports today while vacuuming she noticed there was a pop in her lower back. The patient states the pain is radiating down her buttocks.   Home interventions: advil  about an hr ago.

## 2024-06-06 ENCOUNTER — Encounter: Payer: Self-pay | Admitting: Medical-Surgical

## 2024-06-06 ENCOUNTER — Ambulatory Visit: Admitting: Medical-Surgical

## 2024-06-06 VITALS — BP 104/74 | HR 78 | Resp 20 | Ht 64.0 in | Wt 226.0 lb

## 2024-06-06 DIAGNOSIS — M79604 Pain in right leg: Secondary | ICD-10-CM

## 2024-06-06 DIAGNOSIS — R202 Paresthesia of skin: Secondary | ICD-10-CM

## 2024-06-06 DIAGNOSIS — M545 Low back pain, unspecified: Secondary | ICD-10-CM

## 2024-06-06 DIAGNOSIS — R29898 Other symptoms and signs involving the musculoskeletal system: Secondary | ICD-10-CM

## 2024-06-06 NOTE — Progress Notes (Signed)
" ° °       Established patient visit   History of Present Illness   Discussed the use of AI scribe software for clinical note transcription with the patient, who gave verbal consent to proceed.  History of Present Illness   Stacy Woods is a 58 year old female who presents with back pain radiating to the right leg.  Back pain with radiculopathy - Acute onset of right-sided midthoracic to low back pain after lifting a lightweight bar stool - Heard a pop at the time of injury and had to brace herself on a kitchen island - Pain radiates down the right leg - Pain severity significantly slows ambulation  Neurological symptoms - New numbness and tingling on the right upper lateral leg  - Some numbness in the right saddle area - No loss of bowel or bladder control  Pain management - Using Flexeril  for muscle relaxation - Taking hydrocodone  for pain, limited to nighttime use since last Friday   Physical Exam   Physical Exam Vitals reviewed.  Constitutional:      General: She is not in acute distress.    Appearance: Normal appearance.  HENT:     Head: Normocephalic and atraumatic.  Cardiovascular:     Rate and Rhythm: Normal rate and regular rhythm.  Pulmonary:     Effort: Pulmonary effort is normal. No respiratory distress.  Skin:    General: Skin is warm and dry.  Neurological:     Mental Status: She is alert and oriented to person, place, and time.     Comments: Muscle strength 4/5 with right hip flexion, 5/5 with left hip flexion  Psychiatric:        Mood and Affect: Mood normal.        Behavior: Behavior normal.        Thought Content: Thought content normal.        Judgment: Judgment normal.    Assessment & Plan     Lumbar radiculopathy with new right thigh numbness and tingling and right groin/saddle paresthesias Acute right-sided lumbar radiculopathy with radiating pain, numbness, and tingling. Differential includes muscular strain, disc herniation, or nerve  compression. MRI preferred for soft tissue evaluation. - Ordered MRI of lumbar spine to assess for disc herniation or nerve compression given paresthesias and objective weakness on the right side. - Continue hydrocodone  at bedtime for pain management. - Continue muscle relaxants as needed. - Advised use of heat and ice for symptomatic relief. - Discussed potential for physical therapy if pain persists.    Follow up   Return if symptoms worsen or fail to improve. __________________________________ Zada FREDRIK Palin, DNP, APRN, FNP-BC Primary Care and Sports Medicine [facility] "

## 2024-06-07 ENCOUNTER — Ambulatory Visit: Admitting: Medical-Surgical

## 2024-06-12 ENCOUNTER — Other Ambulatory Visit

## 2024-06-12 DIAGNOSIS — M545 Low back pain, unspecified: Secondary | ICD-10-CM

## 2024-06-12 DIAGNOSIS — R29898 Other symptoms and signs involving the musculoskeletal system: Secondary | ICD-10-CM

## 2024-06-12 DIAGNOSIS — R202 Paresthesia of skin: Secondary | ICD-10-CM

## 2024-06-12 NOTE — Progress Notes (Unsigned)
 "  Office Visit Note  Patient: Stacy Woods             Date of Birth: November 12, 1965           MRN: [id number]             PCP: Willo Mini, NP Referring: Willo Mini, NP Visit Date: 06/21/2024 Occupation: Data Unavailable  Subjective:  No chief complaint on file.   History of Present Illness: Stacy Woods is a 59 y.o. female ***     Activities of Daily Living:  Patient reports morning stiffness for *** {minute/hour:19697}.   Patient {ACTIONS;DENIES/REPORTS:21021675::Denies} nocturnal pain.  Difficulty dressing/grooming: {ACTIONS;DENIES/REPORTS:21021675::Denies} Difficulty climbing stairs: {ACTIONS;DENIES/REPORTS:21021675::Denies} Difficulty getting out of chair: {ACTIONS;DENIES/REPORTS:21021675::Denies} Difficulty using hands for taps, buttons, cutlery, and/or writing: {ACTIONS;DENIES/REPORTS:21021675::Denies}  No Rheumatology ROS completed.   PMFS History:  Patient Active Problem List   Diagnosis Date Noted   Cubital tunnel syndrome on left 02/06/2023   Bilateral lower extremity edema 09/26/2021   Avulsion fracture of left wrist 09/24/2021   Chondromalacia patellae, left knee 11/23/2020   Osteoarthritis of left knee 11/23/2020   Cyst of lateral meniscus of left knee 11/23/2020   Morning joint stiffness 08/28/2019   Constipation 08/28/2019   Right shoulder pain 07/27/2019   Glaucoma 07/21/2019   Transaminitis 12/01/2017   MDD (recurrent major depressive disorder) in remission 11/03/2017   AK (actinic keratosis) 07/14/2017   Low magnesium  level 01/12/2017   B12 deficiency 01/01/2017   Trochanteric bursitis of left hip 10/08/2016   Malabsorption of iron  05/09/2016   Arthritis of knee, degenerative 04/14/2016   Surgical menopause 03/24/2016   Primary osteoarthritis of both knees 10/01/2015   Hypoglycemia 06/12/2015   Cubital tunnel syndrome on right 09/14/2014   Degenerative disc disease, cervical 07/27/2014   Fibromyalgia 04/11/2013   Essential  hypertension, benign 04/11/2013   Anxiety state 04/11/2013   Iron  deficiency anemia 09/04/2011   Palpitations 09/04/2011   Fatigue 09/04/2011   History of gastric bypass 09/03/2011   Insomnia 08/16/2011   Osteoarthrosis, unspecified whether generalized or localized, lower leg 08/16/2011   Myalgia and myositis, unspecified 08/16/2011   Acquired absence of genital organ 08/16/2011   Irritable bowel syndrome 08/16/2011   Seborrheic dermatitis 08/16/2011   Mixed hyperlipidemia 08/16/2011   Morbid obesity (HCC) 08/16/2011    Past Medical History:  Diagnosis Date   AK (actinic keratosis) 07/14/2017   Right upper chest wall   Allergy spring annually   Anemia    Anemia, iron  deficiency 05/02/2015   Arthritis    Family history of breast cancer 03/24/2016   Fibromyalgia    History of gastric bypass 05/09/2016   Hypertension    Insomnia 08/14/2017   Malabsorption of iron  05/09/2016   PONV (postoperative nausea and vomiting)    Prediabetes 09/01/2013   March 2015    Seizure (HCC) 2018   from low blood sugar    Surgical menopause 03/24/2016    Family History  Problem Relation Age of Onset   Hypertension Mother    Breast cancer Mother    Arthritis Mother    Cancer Mother    Diabetes Father    Hypertension Father    Stroke Father    Heart failure Father    CAD Father        has stents   Heart disease Father    Migraines Sister    Healthy Son    Healthy Daughter    Colon cancer Neg Hx    Colon polyps Neg Hx  Esophageal cancer Neg Hx    Stomach cancer Neg Hx    Rectal cancer Neg Hx    Past Surgical History:  Procedure Laterality Date   ABDOMINAL HYSTERECTOMY  06/10/1995   APPENDECTOMY     CESAREAN SECTION  8008,8005   CHOLECYSTECTOMY     CHONDROPLASTY Left 11/23/2020   Procedure: CHONDROPLASTY MEDIAL FEMORAL PATELLA;  Surgeon: Yvone Rush, MD;  Location: [facility];  Service: Orthopedics;  Laterality: Left;   GASTRIC BYPASS  06/09/2009   KNEE  ARTHROSCOPY Left 06/09/2000   KNEE ARTHROSCOPY Left 10/2021   KNEE ARTHROSCOPY WITH EXCISION BAKER'S CYST Left 11/23/2020   Procedure: KNEE ARTHROSCOPY WITH ARTHROSCOPIC CYST EXCISION;  Surgeon: Yvone Rush, MD;  Location: [facility];  Service: Orthopedics;  Laterality: Left;   KNEE ARTHROSCOPY WITH LATERAL RELEASE Left 11/23/2020   Procedure: ARTHROSCOPY KNEE WITH LATERAL RETINACULAR RELEASE, PARTIAL LATERAL MENISCECTOMY;  Surgeon: Yvone Rush, MD;  Location: [facility];  Service: Orthopedics;  Laterality: Left;   TONSILLECTOMY  06/10/1975   ULNAR NERVE TRANSPOSITION Right 06/12/2015   Procedure: RIGHT CUBITAL TUNNEL RELEASE;  Surgeon: Alm Hummer, MD;  Location: [facility];  Service: Orthopedics;  Laterality: Right;   ULNAR TUNNEL RELEASE Left 02/06/2023   Procedure: LEFT CUBITAL TUNNEL RELEASE;  Surgeon: Arlinda Buster, MD;  Location: [facility];  Service: Orthopedics;  Laterality: Left;   Social History[1] Social History   Social History Narrative   Lives alone   Right-handed   Caffeine: <16 oz soda daily     Immunization History  Administered Date(s) Administered   Hepatitis B 10/20/2021   Hepb-cpg 10/10/2021   Influenza Whole 03/29/2013   Influenza, Seasonal, Injecte, Preservative Fre 02/25/2023, 02/26/2024   Influenza,inj,Quad PF,6+ Mos 03/24/2016, 03/23/2017, 03/10/2018, 02/04/2019, 03/02/2020, 02/14/2021, 04/01/2022   Influenza-Unspecified 03/09/2010, 03/10/2015   PFIZER(Purple Top)SARS-COV-2 Vaccination 06/24/2019, 07/13/2019, 03/27/2020   PNEUMOCOCCAL CONJUGATE-20 04/11/2024   Td 06/10/2006   Tdap 03/24/2016   Tetanus Immune Globulin 06/09/1998   Zoster Recombinant(Shingrix ) 04/19/2018, 07/05/2018     Objective: Vital Signs: There were no vitals taken for this visit.   Physical Exam   Musculoskeletal Exam: ***  CDAI Exam: CDAI Score: -- Patient Global: --; Provider Global: -- Swollen: --;  Tender: -- Joint Exam 06/21/2024   No joint exam has been documented for this visit   There is currently no information documented on the homunculus. Go to the Rheumatology activity and complete the homunculus joint exam.  Investigation: No additional findings.  Imaging: DG Lumbar Spine 2-3 Views Result Date: 06/03/2024 CLINICAL DATA:  Low back pain. Heard pop while bending over cleaning. EXAM: LUMBAR SPINE - 2-3 VIEW COMPARISON:  12/15/2022. FINDINGS: 5 nonrib bearing lumbar-type vertebral bodies. Vertebral body heights are maintained. No acute fracture. No static listhesis. Mild disc height loss at L3-L4. Disc spaces otherwise appear maintained. SI joints are unremarkable. IMPRESSION: 1. No acute fracture or static listhesis of the lumbar spine. 2. Mild disc height loss at L3-L4. Electronically Signed   By: Harrietta Sherry M.D.   On: 06/03/2024 13:56    Recent Labs: Lab Results  Component Value Date   WBC 5.0 10/08/2023   HGB 14.0 10/08/2023   PLT 390 10/08/2023   NA 140 10/08/2023   K 4.3 10/08/2023   CL 107 (H) 10/08/2023   CO2 19 (L) 10/08/2023   GLUCOSE 96 10/08/2023   BUN 9 10/08/2023   CREATININE 1.13 (H) 10/08/2023   BILITOT 0.5 10/08/2023   ALKPHOS 134 (H)  10/08/2023   AST 50 (H) 10/08/2023   ALT 31 10/08/2023   PROT 6.7 10/08/2023   ALBUMIN 4.2 10/08/2023   CALCIUM 9.3 10/08/2023   GFRAA 78 06/20/2020    Speciality Comments: Otezla -stopped due to insurance [employer]-an inadequate response Tremfya March 2024  Procedures:  No procedures performed Allergies: Lyrica  [pregabalin ], Percocet [oxycodone -acetaminophen ], Mobic [meloxicam], Naproxen , Nsaids, Prednisone , and Shellfish allergy   Assessment / Plan:     Visit Diagnoses: No diagnosis found.  Orders: No orders of the defined types were placed in this encounter.  No orders of the defined types were placed in this encounter.   Face-to-face time spent with patient was *** minutes. Greater than 50% of  time was spent in counseling and coordination of care.  Follow-Up Instructions: No follow-ups on file.   Maya Nash, MD  Note - This record has been created using Animal nutritionist.  Chart creation errors have been sought, but may not always  have been located. Such creation errors do not reflect on  the standard of medical care.    [1]  Social History Tobacco Use   Smoking status: Never    Passive exposure: Never   Smokeless tobacco: Never  Vaping Use   Vaping status: Never Used  Substance Use Topics   Alcohol use: No   Drug use: No   "

## 2024-06-14 ENCOUNTER — Encounter: Payer: Self-pay | Admitting: Medical-Surgical

## 2024-06-18 ENCOUNTER — Ambulatory Visit: Payer: Self-pay | Admitting: Medical-Surgical

## 2024-06-21 ENCOUNTER — Ambulatory Visit: Payer: Managed Care, Other (non HMO) | Admitting: Rheumatology

## 2024-06-21 DIAGNOSIS — M5030 Other cervical disc degeneration, unspecified cervical region: Secondary | ICD-10-CM

## 2024-06-21 DIAGNOSIS — M7711 Lateral epicondylitis, right elbow: Secondary | ICD-10-CM

## 2024-06-21 DIAGNOSIS — S62102A Fracture of unspecified carpal bone, left wrist, initial encounter for closed fracture: Secondary | ICD-10-CM

## 2024-06-21 DIAGNOSIS — I10 Essential (primary) hypertension: Secondary | ICD-10-CM

## 2024-06-21 DIAGNOSIS — K909 Intestinal malabsorption, unspecified: Secondary | ICD-10-CM

## 2024-06-21 DIAGNOSIS — Q6671 Congenital pes cavus, right foot: Secondary | ICD-10-CM

## 2024-06-21 DIAGNOSIS — M170 Bilateral primary osteoarthritis of knee: Secondary | ICD-10-CM

## 2024-06-21 DIAGNOSIS — Z9884 Bariatric surgery status: Secondary | ICD-10-CM

## 2024-06-21 DIAGNOSIS — L570 Actinic keratosis: Secondary | ICD-10-CM

## 2024-06-21 DIAGNOSIS — R202 Paresthesia of skin: Secondary | ICD-10-CM

## 2024-06-21 DIAGNOSIS — M19041 Primary osteoarthritis, right hand: Secondary | ICD-10-CM

## 2024-06-21 DIAGNOSIS — M2550 Pain in unspecified joint: Secondary | ICD-10-CM

## 2024-06-21 DIAGNOSIS — M19071 Primary osteoarthritis, right ankle and foot: Secondary | ICD-10-CM

## 2024-06-21 DIAGNOSIS — F3340 Major depressive disorder, recurrent, in remission, unspecified: Secondary | ICD-10-CM

## 2024-06-21 DIAGNOSIS — E782 Mixed hyperlipidemia: Secondary | ICD-10-CM

## 2024-06-21 DIAGNOSIS — Z8719 Personal history of other diseases of the digestive system: Secondary | ICD-10-CM

## 2024-06-21 DIAGNOSIS — L409 Psoriasis, unspecified: Secondary | ICD-10-CM

## 2024-06-21 DIAGNOSIS — G8929 Other chronic pain: Secondary | ICD-10-CM

## 2024-06-21 DIAGNOSIS — Z79899 Other long term (current) drug therapy: Secondary | ICD-10-CM

## 2024-06-21 DIAGNOSIS — M7062 Trochanteric bursitis, left hip: Secondary | ICD-10-CM

## 2024-06-21 DIAGNOSIS — Z803 Family history of malignant neoplasm of breast: Secondary | ICD-10-CM

## 2024-06-21 DIAGNOSIS — M797 Fibromyalgia: Secondary | ICD-10-CM

## 2024-06-28 ENCOUNTER — Other Ambulatory Visit (HOSPITAL_BASED_OUTPATIENT_CLINIC_OR_DEPARTMENT_OTHER): Payer: Self-pay | Admitting: Medical-Surgical

## 2024-06-28 DIAGNOSIS — Z1231 Encounter for screening mammogram for malignant neoplasm of breast: Secondary | ICD-10-CM

## 2024-07-23 ENCOUNTER — Ambulatory Visit (HOSPITAL_BASED_OUTPATIENT_CLINIC_OR_DEPARTMENT_OTHER)

## 2024-10-10 ENCOUNTER — Encounter: Admitting: Medical-Surgical
